# Patient Record
Sex: Male | Born: 1950 | Race: White | Hispanic: No | Marital: Single | Smoking: Never smoker
Health system: Southern US, Community
[De-identification: ages and names within clinical notes are randomized; demographics above are authoritative.]

## PROBLEM LIST (undated history)

## (undated) DIAGNOSIS — C20 Malignant neoplasm of rectum: Secondary | ICD-10-CM

## (undated) DIAGNOSIS — R627 Adult failure to thrive: Secondary | ICD-10-CM

## (undated) DIAGNOSIS — M7989 Other specified soft tissue disorders: Secondary | ICD-10-CM

## (undated) DIAGNOSIS — T07XXXA Unspecified multiple injuries, initial encounter: Secondary | ICD-10-CM

## (undated) DIAGNOSIS — E039 Hypothyroidism, unspecified: Secondary | ICD-10-CM

## (undated) DIAGNOSIS — Z7901 Long term (current) use of anticoagulants: Secondary | ICD-10-CM

## (undated) DIAGNOSIS — N2581 Secondary hyperparathyroidism of renal origin: Secondary | ICD-10-CM

## (undated) DIAGNOSIS — E559 Vitamin D deficiency, unspecified: Secondary | ICD-10-CM

## (undated) DIAGNOSIS — E785 Hyperlipidemia, unspecified: Secondary | ICD-10-CM

## (undated) DIAGNOSIS — M1990 Unspecified osteoarthritis, unspecified site: Secondary | ICD-10-CM

## (undated) DIAGNOSIS — F419 Anxiety disorder, unspecified: Secondary | ICD-10-CM

## (undated) DIAGNOSIS — I10 Essential (primary) hypertension: Secondary | ICD-10-CM

## (undated) DIAGNOSIS — D649 Anemia, unspecified: Secondary | ICD-10-CM

## (undated) DIAGNOSIS — R0602 Shortness of breath: Secondary | ICD-10-CM

## (undated) DIAGNOSIS — C187 Malignant neoplasm of sigmoid colon: Secondary | ICD-10-CM

## (undated) DIAGNOSIS — J811 Chronic pulmonary edema: Secondary | ICD-10-CM

## (undated) DIAGNOSIS — I2129 ST elevation (STEMI) myocardial infarction involving other sites: Secondary | ICD-10-CM

## (undated) DIAGNOSIS — N189 Chronic kidney disease, unspecified: Secondary | ICD-10-CM

## (undated) DIAGNOSIS — C189 Malignant neoplasm of colon, unspecified: Secondary | ICD-10-CM

## (undated) DIAGNOSIS — M549 Dorsalgia, unspecified: Secondary | ICD-10-CM

## (undated) DIAGNOSIS — I2699 Other pulmonary embolism without acute cor pulmonale: Secondary | ICD-10-CM

## (undated) HISTORY — DX: Other specified soft tissue disorders: M79.89

## (undated) HISTORY — DX: Hyperlipidemia, unspecified: E78.5

## (undated) HISTORY — DX: Dorsalgia, unspecified: M54.9

## (undated) HISTORY — DX: Chronic pulmonary edema: J81.1

## (undated) HISTORY — DX: Morbid (severe) obesity due to excess calories: E66.01

## (undated) HISTORY — DX: Hypothyroidism, unspecified: E03.9

## (undated) HISTORY — DX: Vitamin D deficiency, unspecified: E55.9

## (undated) HISTORY — DX: Malignant neoplasm of colon, unspecified: C18.9

---

## 2010-02-16 ENCOUNTER — Inpatient Hospital Stay (HOSPITAL_COMMUNITY): Admission: EM | Admit: 2010-02-16 | Discharge: 2010-02-18 | Payer: Self-pay | Admitting: Emergency Medicine

## 2010-02-16 ENCOUNTER — Ambulatory Visit: Payer: Self-pay | Admitting: Cardiovascular Disease

## 2010-02-17 ENCOUNTER — Encounter (INDEPENDENT_AMBULATORY_CARE_PROVIDER_SITE_OTHER): Payer: Self-pay | Admitting: Internal Medicine

## 2010-02-17 ENCOUNTER — Ambulatory Visit: Payer: Self-pay | Admitting: Vascular Surgery

## 2010-02-21 ENCOUNTER — Ambulatory Visit: Payer: Self-pay | Admitting: Cardiology

## 2010-02-21 DIAGNOSIS — N183 Chronic kidney disease, stage 3 unspecified: Secondary | ICD-10-CM | POA: Insufficient documentation

## 2010-02-21 DIAGNOSIS — E039 Hypothyroidism, unspecified: Secondary | ICD-10-CM

## 2010-02-21 DIAGNOSIS — I10 Essential (primary) hypertension: Secondary | ICD-10-CM

## 2010-02-21 DIAGNOSIS — E785 Hyperlipidemia, unspecified: Secondary | ICD-10-CM

## 2010-02-21 LAB — CONVERTED CEMR LAB

## 2010-02-22 ENCOUNTER — Ambulatory Visit: Payer: Self-pay | Admitting: Internal Medicine

## 2010-02-22 DIAGNOSIS — Z86718 Personal history of other venous thrombosis and embolism: Secondary | ICD-10-CM | POA: Insufficient documentation

## 2010-02-23 ENCOUNTER — Ambulatory Visit: Payer: Self-pay

## 2010-03-02 ENCOUNTER — Encounter: Payer: Self-pay | Admitting: Internal Medicine

## 2010-03-02 LAB — CONVERTED CEMR LAB: POC INR: 2.34

## 2010-03-03 ENCOUNTER — Encounter: Payer: Self-pay | Admitting: Internal Medicine

## 2010-03-09 ENCOUNTER — Encounter: Payer: Self-pay | Admitting: Cardiovascular Disease

## 2010-03-09 LAB — CONVERTED CEMR LAB
POC INR: 2.3
Prothrombin Time: 28.5 s

## 2010-03-16 ENCOUNTER — Ambulatory Visit: Payer: Self-pay | Admitting: Cardiovascular Disease

## 2010-03-16 LAB — CONVERTED CEMR LAB: POC INR: 2.4

## 2010-03-30 ENCOUNTER — Encounter: Payer: Self-pay | Admitting: Internal Medicine

## 2010-03-30 LAB — CONVERTED CEMR LAB: POC INR: 2.6

## 2010-04-25 ENCOUNTER — Ambulatory Visit: Payer: Self-pay | Admitting: Cardiology

## 2010-05-22 ENCOUNTER — Ambulatory Visit: Payer: Self-pay | Admitting: Cardiology

## 2010-06-19 ENCOUNTER — Ambulatory Visit: Payer: Self-pay | Admitting: Cardiology

## 2010-07-17 ENCOUNTER — Ambulatory Visit: Payer: Self-pay | Admitting: Cardiology

## 2010-08-14 ENCOUNTER — Ambulatory Visit: Admission: RE | Admit: 2010-08-14 | Discharge: 2010-08-14 | Payer: Self-pay | Source: Home / Self Care

## 2010-08-14 LAB — CONVERTED CEMR LAB: INR: 1.9

## 2010-09-05 NOTE — Medication Information (Signed)
Summary: rov/cs  Anticoagulant Therapy  Managed by: Weston Brass, PharmD PCP: Dr. Vanessa Landis Supervising MD: Shirlee Latch MD, Freida Busman Indication 1: DVT/PE (first episode) Lab Used: [facility] [place] Site: [address] INR POC 2.3 INR RANGE 2.0-3.0  Dietary changes: no    Health status changes: no    Bleeding/hemorrhagic complications: no    Recent/future hospitalizations: no    Any changes in medication regimen? no    Recent/future dental: no  Any missed doses?: no       Is patient compliant with meds? yes       Allergies: No Known Drug Allergies  Anticoagulation Management History:      The patient is taking warfarin and comes in today for a routine follow up visit.  Negative risk factors for bleeding include an age less than 54 years old.  The bleeding index is 'low risk'.  Positive CHADS2 values include History of HTN.  Negative CHADS2 values include Age > 77 years old.  Anticoagulation responsible provider: Shirlee Latch MD, Janos Shampine.  INR POC: 2.3.  Cuvette Lot#: [id number].  Exp: 07/2011.    Anticoagulation Management Assessment/Plan:      The patient's current anticoagulation dose is Warfarin sodium 5 mg tabs: Use as directed by Anticoagulation Clinic.  The target INR is 2.0-3.0.  The next INR is due 07/17/2010.  Anticoagulation instructions were given to patient.  Results were reviewed/authorized by Weston Brass, PharmD.  He was notified by Weston Brass PharmD.         Prior Anticoagulation Instructions: INR 2.5  Continue Coumadin as scheduled:  1 tablet every day of the week.  Return to clinic in 4 weeks.    Current Anticoagulation Instructions: INR 2.3  Continue same dose of 1 tablet every day.  Recheck INR in 4 weeks.

## 2010-09-05 NOTE — Assessment & Plan Note (Signed)
Summary: Pulmonary consultation/ PE/DVT on R   Visit Type:  Initial Consult Primary Provider/Referring Provider:  Dr. Vanessa [place]  CC:  Dyspnea- Hx of PE.  History of Present Illness: 60 yowm with morbid obesity  never smoked no previous resp problems prior to PE 02/16/2010  February 22, 2010  1st pulmonary office eval cc abrupt onset sob 7/14 > [facility] dx PE  and Left leg DVT discharged 7/16 with f/u at [facility] coumadin and lovenox bridge.  breathing better. no pleuritic pain, no hemoptysis.  H/o remote right ankle fx. Pt denies any significant sore throat, dysphagia, itching, sneezing,  nasal congestion or excess secretions,  fever, chills, sweats, unintended wt loss, pleuritic or exertional cp, hempoptysis, change in activity tolerance  orthopnea pnd or leg swelling.   Current Medications (verified): 1)  Warfarin Sodium 5 Mg Tabs (Warfarin Sodium) .... Use As Directed By Anticoagulation Clinic 2)  Lovenox 150 Mg/ml Soln (Enoxaparin Sodium) .... Subcutaneously Two Times A Day 3)  Levothroid 25 Mcg Tabs (Levothyroxine Sodium) .... Take 1 Tablet 30 Minutes Before Breakfast in Am 4)  Simvastatin 40 Mg Tabs (Simvastatin) .... Daily At Bedtime 5)  Aspirin 81 Mg Tbec (Aspirin) .... Daily At Bedtime 6)  Atenolol 100 Mg Tabs (Atenolol) .... Take One Tablet By Mouth Daily 7)  Doxazosin Mesylate 2 Mg Tabs (Doxazosin Mesylate) .... Take 1 Tablet Daily 8)  Multivitamins  Tabs (Multiple Vitamin) .... 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  The patient denies shortness of  breath at rest, productive cough, non-productive cough, coughing up blood, chest pain, irregular heartbeats, acid heartburn, indigestion, loss of appetite, weight change, abdominal pain, difficulty swallowing, sore throat, tooth/dental problems, headaches, nasal congestion/difficulty breathing through nose, sneezing, itching, ear ache, anxiety, depression, joint stiffness or pain, rash, change in color of mucus, and fever.    Vital Signs:  Patient profile:   60 year old male Height:      71 inches Weight:      346.25 pounds BMI:     48.47 O2 Sat:      97 % on Room air Temp:     97.9 degrees F oral Pulse rate:   68 / minute BP sitting:   124 / 78  (left arm) Cuff size:   large  Vitals Entered By: Vernie Murders (February 22, 2010 10:04 AM)  Nutrition Counseling: Patient's BMI is greater than 25 and therefore counseled on weight management options.  O2 Flow:  Room air  Physical Exam  Additional Exam:  wt 346 February 22, 2010  amb obese wm nad HEENT: nl dentition, turbinates, and orophanx. Nl external ear canals without cough reflex NECK :  without JVD/Nodes/TM/ nl carotid upstrokes bilaterally LUNGS: no acc muscle use, clear to A and P bilaterally without cough on insp or exp maneuvers CV:  RRR  no s3 or murmur or increase in P2, no edema  ABD:  soft and nontender with nl excursion in the supine position. 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 Use as directed by anticoagulation clinic    Lovenox 150 Mg/ml Soln (Enoxaparin sodium) ..... Subcutaneously two times a day    Aspirin 81 Mg Tbec  (Aspirin) .[name]... Daily at bedtime  rx per coumadin clinic, rec minimum of 6 months due to extensive dvt and morbid obesity.  If clears the dvt and begins to loose wt then there might be an option to stop coumadin, otherwise continue indefinitely.  Orders: Consultation Level IV (16109)  Problem # 2:  MORBID OBESITY (ICD-278.01)  Weight control is a matter of calorie balance which needs to be tilted in the pt's favor by eating less and exercising more.  Specifically, I recommended  exercise at a level where pt  is short of breath but not out of breath 30 minutes daily.  If not losing weight on this program, I would strongly recommend pt see a nutritionist with a food diary recorded for two weeks prior to the visit.     Orders: Consultation Level IV [phone])  Patient Instructions: 1)  Coumadin treatment is a minimum of 6 months then you will need the right leg checked for clots and regroup on on issue of longterm coumadin 2)  Weight loss will help you immensely in terms of risk of future clots

## 2010-09-05 NOTE — Medication Information (Signed)
Summary: rov/ewj  Anticoagulant Therapy  Managed by: Weston Brass, PharmD PCP: Dr. Vanessa  Supervising MD: Excell Seltzer MD, Casimiro Needle Indication 1: DVT/PE (first episode) Lab Used: [facility] [place] Site: [facility] INR POC 2.4 INR RANGE 2.0-3.0  Dietary changes: no    Health status changes: no    Bleeding/hemorrhagic complications: no    Recent/future hospitalizations: no    Any changes in medication regimen? no    Recent/future dental: no  Any missed doses?: no       Is patient compliant with meds? yes       Allergies: No Known Drug Allergies  Anticoagulation Management History:      The patient is taking warfarin and comes in today for a routine follow up visit.  Negative risk factors for bleeding include an age less than 25 years old.  The bleeding index is 'low risk'.  Positive CHADS2 values include History of HTN.  Negative CHADS2 values include Age > 20 years old.  Anticoagulation responsible provider: Excell Seltzer MD, Casimiro Needle.  INR POC: 2.4.  Cuvette Lot#: [id number].  Exp: 05/2011.    Anticoagulation Management Assessment/Plan:      The patient's current anticoagulation dose is Warfarin sodium 5 mg tabs: Use as directed by Anticoagulation Clinic.  The target INR is 2.0-3.0.  The next INR is due 03/30/2010.  Anticoagulation instructions were given to patient.  Results were reviewed/authorized by Weston Brass, PharmD.  He was notified by Alease Frame, PharmD Candidate.         Prior Anticoagulation Instructions: INR 2.3  Called spoke with pt advised to continue on same dosage 5mg  daily.  Pt is in town next week.  Made OV in [facility] 03/16/10.   Current Anticoagulation Instructions: INR 2.4  Continue taking 1 tablet (5mg ) every day.  Recheck in 2 weeks.

## 2010-09-05 NOTE — Medication Information (Signed)
Summary: rov/sp  Anticoagulant Therapy  Managed by: Weston Brass, PharmD PCP: Dr. Vanessa [place] Supervising MD: Shirlee Latch MD, Freida Busman Indication 1: DVT/PE (first episode) Lab Used: [facility]  Site: [address] INR POC 2.7 INR RANGE 2.0-3.0  Dietary changes: no    Health status changes: no    Bleeding/hemorrhagic complications: no    Recent/future hospitalizations: no    Any changes in medication regimen? no    Recent/future dental: no  Any missed doses?: no       Is patient compliant with meds? no       Allergies: No Known Drug Allergies  Anticoagulation Management History:      The patient is taking warfarin and comes in today for a routine follow up visit.  Negative risk factors for bleeding include an age less than 45 years old.  The bleeding index is 'low risk'.  Positive CHADS2 values include History of HTN.  Negative CHADS2 values include Age > 59 years old.  Anticoagulation responsible provider: Shirlee Latch MD, Shaun Runyon.  INR POC: 2.7.  Cuvette Lot#: [id number].  Exp: 05/2011.    Anticoagulation Management Assessment/Plan:      The patient's current anticoagulation dose is Warfarin sodium 5 mg tabs: Use as directed by Anticoagulation Clinic.  The target INR is 2.0-3.0.  The next INR is due 05/22/2010.  Anticoagulation instructions were given to patient.  Results were reviewed/authorized by Weston Brass, PharmD.  He was notified by Harrel Carina, PharmD candidate.         Prior Anticoagulation Instructions: INR 2.6  Spoke with pt.  Continue same dose of 1 tablet every day. Recheck INR in 4 weeks.   Current Anticoagulation Instructions: INR 2.7  Continue taking 1 tablet everyday. Re-check INR in 4 weeks.

## 2010-09-05 NOTE — Medication Information (Signed)
Summary: rov/jaj  Anticoagulant Therapy  Managed by: Weston Brass, PharmD PCP: Dr. Vanessa [place] Supervising MD: Patty Sermons Indication 1: DVT/PE (first episode) Lab Used: [facility] [place] Site: [address] INR POC 2.5 INR RANGE 2.0-3.0  Dietary changes: no    Health status changes: no    Bleeding/hemorrhagic complications: no    Recent/future hospitalizations: no    Any changes in medication regimen? no    Recent/future dental: no  Any missed doses?: no       Is patient compliant with meds? yes       Allergies: No Known Drug Allergies  Anticoagulation Management History:      The patient is taking warfarin and comes in today for a routine follow up visit.  Negative risk factors for bleeding include an age less than 37 years old.  The bleeding index is 'low risk'.  Positive CHADS2 values include History of HTN.  Negative CHADS2 values include Age > 68 years old.  Anticoagulation responsible provider: Sheccid Lahmann.  INR POC: 2.5.  Cuvette Lot#: [id number].  Exp: 06/2011.    Anticoagulation Management Assessment/Plan:      The patient's current anticoagulation dose is Warfarin sodium 5 mg tabs: Use as directed by Anticoagulation Clinic.  The target INR is 2.0-3.0.  The next INR is due 06/19/2010.  Anticoagulation instructions were given to patient.  Results were reviewed/authorized by Weston Brass, PharmD.  He was notified by Haynes Hoehn, PharmD Candidate.         Prior Anticoagulation Instructions: INR 2.7  Continue taking 1 tablet everyday. Re-check INR in 4 weeks.   Current Anticoagulation Instructions: INR 2.5  Continue Coumadin as scheduled:  1 tablet every day of the week.  Return to clinic in 4 weeks.

## 2010-09-05 NOTE — Medication Information (Signed)
Summary: rov/tm  Anticoagulant Therapy  Managed by: Weston Brass, PharmD PCP: Dr. Vanessa [place] Supervising MD: Shirlee Latch MD, Freida Busman Indication 1: DVT/PE (first episode) Lab Used: [facility] [place] Site: [address] INR POC 2.1 INR RANGE 2.0-3.0  Dietary changes: no    Health status changes: no    Bleeding/hemorrhagic complications: no    Recent/future hospitalizations: no    Any changes in medication regimen? no    Recent/future dental: no  Any missed doses?: no       Is patient compliant with meds? yes      Comments: Pt to be out of town starting 7/25 for 2 weeks due to work.  Given prescription to get INR checked twice while away.  Allergies: No Known Drug Allergies  Anticoagulation Management History:      The patient is taking warfarin and comes in today for a routine follow up visit.  Negative risk factors for bleeding include an age less than 76 years old.  The bleeding index is 'low risk'.  Positive CHADS2 values include History of HTN.  Negative CHADS2 values include Age > 45 years old.  Anticoagulation responsible provider: Shirlee Latch MD, Raianna Slight.  INR POC: 2.1.  Cuvette Lot#: [id number].  Exp: 05/2011.    Anticoagulation Management Assessment/Plan:      The patient's current anticoagulation dose is Warfarin sodium 5 mg tabs: Use as directed by Anticoagulation Clinic.  The target INR is 2.0-3.0.  The next INR is due 03/02/2010.  Anticoagulation instructions were given to patient.  Results were reviewed/authorized by Weston Brass, PharmD.  He was notified by Dillard Cannon.         Prior Anticoagulation Instructions: INR 1.6 Today take 2 tabs, Wednesday take 1.5 tabs and also continue Lovenox injections. Recheck in 2 days.   Current Anticoagulation Instructions: INR 2.1  D/C Lovenox.  Take 1 tab daily.  Re-check on 7/28 and 8/4.   Prescriptions: WARFARIN SODIUM 5 MG TABS (WARFARIN SODIUM) Use as directed by Anticoagulation Clinic  #45 x 2   Entered by:    Weston Brass PharmD   Authorized by:   Rollene Rotunda, MD, [facility]   Signed by:   Weston Brass PharmD on 02/23/2010   Method used:   Electronically to        [facility] [phone]* (retail)       [address]       [place], [place]  [address]       Ph: [phone]       Fax: [phone]   RxID:   [id number]

## 2010-09-05 NOTE — Medication Information (Signed)
Summary: Coumadin Clinic  Anticoagulant Therapy  Managed by: Weston Brass, PharmD PCP: Dr. Vanessa [place] Supervising MD: Graciela Husbands MD, Viviann Spare Indication 1: DVT/PE (first episode) Lab Used: [facility] [place] Site: [address] PT 27.5 INR POC 2.6 INR RANGE 2.0-3.0  Dietary changes: no    Health status changes: no    Bleeding/hemorrhagic complications: no    Recent/future hospitalizations: no    Any changes in medication regimen? no    Recent/future dental: no  Any missed doses?: no       Is patient compliant with meds? yes       Allergies: No Known Drug Allergies  Anticoagulation Management History:      His anticoagulation is being managed by telephone today.  Negative risk factors for bleeding include an age less than 72 years old.  The bleeding index is 'low risk'.  Positive CHADS2 values include History of HTN.  Negative CHADS2 values include Age > 38 years old.  Prothrombin time is 27.5.  Anticoagulation responsible provider: Graciela Husbands MD, Viviann Spare.  INR POC: 2.6.  Exp: 05/2011.    Anticoagulation Management Assessment/Plan:      The patient's current anticoagulation dose is Warfarin sodium 5 mg tabs: Use as directed by Anticoagulation Clinic.  The target INR is 2.0-3.0.  The next INR is due 04/25/2010.  Anticoagulation instructions were given to patient.  Results were reviewed/authorized by Weston Brass, PharmD.  He was notified by Weston Brass PharmD.         Prior Anticoagulation Instructions: INR 2.4  Continue taking 1 tablet (5mg ) every day.  Recheck in 2 weeks.   Current Anticoagulation Instructions: INR 2.6  Spoke with pt.  Continue same dose of 1 tablet every day. Recheck INR in 4 weeks.

## 2010-09-05 NOTE — Medication Information (Signed)
Summary: Coumadin Clinic  Anticoagulant Therapy  Managed by: Cloyde Reams, RN, BSN PCP: Dr. Vanessa [place] Supervising MD: Clifton Schuyler MD, Cristal Deer Indication 1: DVT/PE (first episode) Lab Used: [facility] [place] Site: [facility] PT 28.5 INR POC 2.3 INR RANGE 2.0-3.0    Bleeding/hemorrhagic complications: no     Any changes in medication regimen? no     Any missed doses?: no       Is patient compliant with meds? yes       Allergies: No Known Drug Allergies  Anticoagulation Management History:      His anticoagulation is being managed by telephone today.  Negative risk factors for bleeding include an age less than 74 years old.  The bleeding index is 'low risk'.  Positive CHADS2 values include History of HTN.  Negative CHADS2 values include Age > 69 years old.  Prothrombin time is 28.5.  Anticoagulation responsible provider: Clifton Reginold MD, Cristal Deer.  INR POC: 2.3.  Exp: 05/2011.    Anticoagulation Management Assessment/Plan:      The patient's current anticoagulation dose is Warfarin sodium 5 mg tabs: Use as directed by Anticoagulation Clinic.  The target INR is 2.0-3.0.  The next INR is due 03/16/2010.  Anticoagulation instructions were given to patient.  Results were reviewed/authorized by Cloyde Reams, RN, BSN.  He was notified by Cloyde Reams RN.         Prior Anticoagulation Instructions: INR 2.34  Spoke with pt.  Continue same dose of 5mg  daily.  Recheck INR in 1 week.  Pt has Rx to check in [place]  Current Anticoagulation Instructions: INR 2.3  Called spoke with pt advised to continue on same dosage 5mg  daily.  Pt is in town next week.  Made OV in [facility] 03/16/10.

## 2010-09-05 NOTE — Medication Information (Signed)
Summary: Coumadin Clinic  Anticoagulant Therapy  Managed by: Cloyde Reams, RN, BSN PCP: Dr. Vanessa [place] Supervising MD: Gala Romney MD, Reuel Boom Indication 1: DVT/PE (first episode) Lab Used: [facility]  Site: [address] PT 22.8 INR POC 2.34 INR RANGE 2.0-3.0  Dietary changes: no    Health status changes: no    Bleeding/hemorrhagic complications: no    Recent/future hospitalizations: no    Any changes in medication regimen? no    Recent/future dental: no  Any missed doses?: no       Is patient compliant with meds? yes      Comments: Lab from [place], [place] received 7/29  Allergies: No Known Drug Allergies  Anticoagulation Management History:      His anticoagulation is being managed by telephone today.  Negative risk factors for bleeding include an age less than 68 years old.  The bleeding index is 'low risk'.  Positive CHADS2 values include History of HTN.  Negative CHADS2 values include Age > 66 years old.  Prothrombin time is 22.8.  Anticoagulation responsible provider: Leodan Bolyard MD, Reuel Boom.  INR POC: 2.34.  Exp: 05/2011.    Anticoagulation Management Assessment/Plan:      The patient's current anticoagulation dose is Warfarin sodium 5 mg tabs: Use as directed by Anticoagulation Clinic.  The target INR is 2.0-3.0.  The next INR is due 03/09/2010.  Anticoagulation instructions were given to patient.  Results were reviewed/authorized by Cloyde Reams, RN, BSN.  He was notified by Cloyde Reams RN.         Prior Anticoagulation Instructions: INR 2.1  D/C Lovenox.  Take 1 tab daily.  Re-check on 7/28 and 8/4.    Current Anticoagulation Instructions: INR 2.34  Spoke with pt.  Continue same dose of 5mg  daily.  Recheck INR in 1 week.  Pt has Rx to check in [place]

## 2010-09-05 NOTE — Medication Information (Signed)
Summary: ccn  Anticoagulant Therapy  Managed by: Bethena Midget, RN, BSN Supervising MD: Jens Som MD, Arlys John Indication 1: DVT/PE (first episode) Lab Used: [facility] [place] Site: [address] INR POC 1.6 INR RANGE 2.0-3.0  Dietary changes: no    Health status changes: no    Bleeding/hemorrhagic complications: no    Recent/future hospitalizations: yes       Details: Admitted to [facility] from 7/14 to 7/16 for PE  Any changes in medication regimen? no    Recent/future dental: no  Any missed doses?: no       Is patient compliant with meds? yes      Comments: Started 7/16 with 10mg s and Lovenox 150mg s Q12hrs. INR on 7/16 was  1.16. Discharged home on 5mg s daily.   Current Medications (verified): 1)  Warfarin Sodium 5 Mg Tabs (Warfarin Sodium) .... Use As Directed By Anticoagulation Clinic 2)  Lovenox 150 Mg/ml Soln (Enoxaparin Sodium) .... Subcutaneously Two Times A Day 3)  Levothroid 25 Mcg Tabs (Levothyroxine Sodium) .... Take 1 Tablet 30 Minutes Before Breakfast in Am 4)  Simvastatin 40 Mg Tabs (Simvastatin) .... Daily At Bedtime 5)  Aspirin 81 Mg Tbec (Aspirin) .... Daily At Bedtime 6)  Atenolol 100 Mg Tabs (Atenolol) .... Take One Tablet By Mouth Daily 7)  Doxazosin Mesylate 2 Mg Tabs (Doxazosin Mesylate) .... Take 1 Tablet Daily 8)  Multivitamins  Tabs (Multiple Vitamin) .... Take 1 Tablet Daily  Allergies (verified): No Known Drug Allergies  Anticoagulation Management History:      The patient is taking warfarin and comes in today for a routine follow up visit.  Negative risk factors for bleeding include an age less than 50 years old.  The bleeding index is 'low risk'.  Negative CHADS2 values include Age > 103 years old.  Anticoagulation responsible provider: Jens Som MD, Arlys John.  INR POC: 1.6.  Cuvette Lot#: [id number].  Exp: 05/2011.    Anticoagulation Management Assessment/Plan:      The patient's current anticoagulation dose is Warfarin sodium 5 mg tabs: Use  as directed by Anticoagulation Clinic.  The target INR is 2.0-3.0.  The next INR is due 02/23/2010.  Anticoagulation instructions were given to patient.  Results were reviewed/authorized by Bethena Midget, RN, BSN.  He was notified by Bethena Midget, RN, BSN.         Current Anticoagulation Instructions: INR 1.6 Today take 2 tabs, Wednesday take 1.5 tabs and also continue Lovenox injections. Recheck in 2 days.

## 2010-09-07 NOTE — Medication Information (Signed)
Summary: rov/sp  Anticoagulant Therapy  Managed by: Weston Brass, PharmD PCP: Dr. Vanessa [place] Supervising MD: Jens Som MD, Arlys John Indication 1: DVT/PE (first episode) Lab Used: [facility] [place] Site: [address] INR POC 2.1 INR RANGE 2.0-3.0  Dietary changes: no    Health status changes: no    Bleeding/hemorrhagic complications: no    Recent/future hospitalizations: no    Any changes in medication regimen? no    Recent/future dental: no  Any missed doses?: no       Is patient compliant with meds? yes       Allergies: No Known Drug Allergies  Anticoagulation Management History:      The patient is taking warfarin and comes in today for a routine follow up visit.  Negative risk factors for bleeding include an age less than 40 years old.  The bleeding index is 'low risk'.  Positive CHADS2 values include History of HTN.  Negative CHADS2 values include Age > 14 years old.  Anticoagulation responsible provider: Jens Som MD, Arlys John.  INR POC: 2.1.  Cuvette Lot#: [id number].  Exp: 08/2011.    Anticoagulation Management Assessment/Plan:      The patient's current anticoagulation dose is Warfarin sodium 5 mg tabs: Use as directed by Anticoagulation Clinic.  The target INR is 2.0-3.0.  The next INR is due 08/14/2010.  Anticoagulation instructions were given to patient.  Results were reviewed/authorized by Weston Brass, PharmD.  He was notified by Weston Brass PharmD.         Prior Anticoagulation Instructions: INR 2.3  Continue same dose of 1 tablet every day.  Recheck INR in 4 weeks.   Current Anticoagulation Instructions: INR 2.1  Continue same dose of 1 tablet every day.  Recheck INR 4 weeks.

## 2010-09-07 NOTE — Medication Information (Signed)
Summary: rov/sp  Anticoagulant Therapy  Managed by: Louann Sjogren, PharmD PCP: Dr. Vanessa Amherst Supervising MD: Jens Som MD, Arlys John Indication 1: DVT/PE (first episode) Lab Used: [facility] [place] Site: [address] INR POC 1.9 INR RANGE 2.0-3.0  Dietary changes: no    Health status changes: no    Bleeding/hemorrhagic complications: no    Recent/future hospitalizations: no    Any changes in medication regimen? no    Recent/future dental: no  Any missed doses?: no       Is patient compliant with meds? yes       Allergies: No Known Drug Allergies  Anticoagulation Management History:      The patient is taking warfarin and comes in today for a routine follow up visit.  Negative risk factors for bleeding include an age less than 71 years old.  The bleeding index is 'low risk'.  Positive CHADS2 values include History of HTN.  Negative CHADS2 values include Age > 76 years old.  Today's INR is 1.9.  Anticoagulation responsible provider: Jens Som MD, Arlys John.  INR POC: 1.9.  Cuvette Lot#: [id number].  Exp: 08/2011.    Anticoagulation Management Assessment/Plan:      The patient's current anticoagulation dose is Warfarin sodium 5 mg tabs: Use as directed by Anticoagulation Clinic.  The target INR is 2.0-3.0.  The next INR is due 09/11/2010.  Anticoagulation instructions were given to patient.  Results were reviewed/authorized by Louann Sjogren, PharmD.  He was notified by Louann Sjogren PharmD.         Prior Anticoagulation Instructions: INR 2.1  Continue same dose of 1 tablet every day.  Recheck INR 4 weeks.   Current Anticoagulation Instructions: INR 1.9 (goal 2-3)  Continue taking 1 tablet everyday.  Next appointment: Monday, Feb. 6th at 8:00AM.

## 2010-09-11 ENCOUNTER — Encounter: Payer: Self-pay | Admitting: Cardiology

## 2010-09-11 ENCOUNTER — Encounter (INDEPENDENT_AMBULATORY_CARE_PROVIDER_SITE_OTHER): Payer: Self-pay

## 2010-09-11 DIAGNOSIS — I2699 Other pulmonary embolism without acute cor pulmonale: Secondary | ICD-10-CM

## 2010-09-11 DIAGNOSIS — Z7901 Long term (current) use of anticoagulants: Secondary | ICD-10-CM

## 2010-09-21 NOTE — Medication Information (Signed)
Summary: Coumadin Clinic  Anticoagulant Therapy  Managed by: Weston Brass, PharmD PCP: Dr. Vanessa McDougal Supervising MD: Myrtis Ser MD, Tinnie Gens Indication 1: DVT/PE (first episode) Lab Used: [facility] [place] Site: [address] INR POC 2.8 INR RANGE 2.0-3.0  Dietary changes: no    Health status changes: no    Bleeding/hemorrhagic complications: no    Recent/future hospitalizations: no    Any changes in medication regimen? yes       Details: Doxycycline BID x 10 days - started Saturday  Recent/future dental: no  Any missed doses?: no       Is patient compliant with meds? yes       Allergies: No Known Drug Allergies  Anticoagulation Management History:      The patient is taking warfarin and comes in today for a routine follow up visit.  Negative risk factors for bleeding include an age less than 29 years old.  The bleeding index is 'low risk'.  Positive CHADS2 values include History of HTN.  Negative CHADS2 values include Age > 3 years old.  His last INR was 1.9.  Anticoagulation responsible provider: Myrtis Ser MD, Tinnie Gens.  INR POC: 2.8.  Cuvette Lot#: [id number].  Exp: 08/2011.    Anticoagulation Management Assessment/Plan:      The patient's current anticoagulation dose is Warfarin sodium 5 mg tabs: Use as directed by Anticoagulation Clinic.  The target INR is 2.0-3.0.  The next INR is due 10/10/2010.  Anticoagulation instructions were given to patient.  Results were reviewed/authorized by Weston Brass, PharmD.  He was notified by Margot Chimes PharmD Candidate.         Prior Anticoagulation Instructions: INR 1.9 (goal 2-3)  Continue taking 1 tablet everyday.  Next appointment: Monday, Feb. 6th at 8:00AM.    Current Anticoagulation Instructions: INR 2.8  Continue your current Coumadin regimen of 1 tablet everyday

## 2010-10-09 ENCOUNTER — Encounter: Payer: Self-pay | Admitting: Internal Medicine

## 2010-10-09 DIAGNOSIS — I2699 Other pulmonary embolism without acute cor pulmonale: Secondary | ICD-10-CM

## 2010-10-09 DIAGNOSIS — I82409 Acute embolism and thrombosis of unspecified deep veins of unspecified lower extremity: Secondary | ICD-10-CM | POA: Insufficient documentation

## 2010-10-09 DIAGNOSIS — Z8672 Personal history of thrombophlebitis: Secondary | ICD-10-CM

## 2010-10-10 ENCOUNTER — Encounter (INDEPENDENT_AMBULATORY_CARE_PROVIDER_SITE_OTHER): Payer: Self-pay

## 2010-10-10 ENCOUNTER — Encounter: Payer: Self-pay | Admitting: Cardiology

## 2010-10-10 DIAGNOSIS — I2699 Other pulmonary embolism without acute cor pulmonale: Secondary | ICD-10-CM

## 2010-10-10 DIAGNOSIS — I80299 Phlebitis and thrombophlebitis of other deep vessels of unspecified lower extremity: Secondary | ICD-10-CM

## 2010-10-10 LAB — CONVERTED CEMR LAB: POC INR: 2.1

## 2010-10-17 NOTE — Medication Information (Signed)
Summary: rov/sp  Anticoagulant Therapy  Managed by: Weston Brass, PharmD PCP: Dr. Vanessa Jewett Supervising MD: Patty Sermons Indication 1: DVT/PE (first episode) Lab Used: [facility] [place] Site: [address] INR POC 2.1 INR RANGE 2.0-3.0  Dietary changes: no    Health status changes: no    Bleeding/hemorrhagic complications: yes       Details: small amount of blood in stool 3 weeks ago after coughing  Recent/future hospitalizations: no    Any changes in medication regimen? no    Recent/future dental: no  Any missed doses?: no       Is patient compliant with meds? yes       Allergies: No Known Drug Allergies  Anticoagulation Management History:      The patient is taking warfarin and comes in today for a routine follow up visit.  Negative risk factors for bleeding include an age less than 79 years old.  The bleeding index is 'low risk'.  Positive CHADS2 values include History of HTN.  Negative CHADS2 values include Age > 74 years old.  His last INR was 1.9.  Anticoagulation responsible provider: Idonna Heeren.  INR POC: 2.1.  Exp: 08/2011.    Anticoagulation Management Assessment/Plan:      The patient's current anticoagulation dose is Warfarin sodium 5 mg tabs: Use as directed by Anticoagulation Clinic.  The target INR is 2.0-3.0.  The next INR is due 11/07/2010.  Anticoagulation instructions were given to patient.  Results were reviewed/authorized by Weston Brass, PharmD.  He was notified by Weston Brass PharmD.         Prior Anticoagulation Instructions: INR 2.8  Continue your current Coumadin regimen of 1 tablet everyday  Current Anticoagulation Instructions: INR 2.1  Continue same dose of 1 tablet every day.  Recheck INR in 4 weeks.

## 2010-10-21 LAB — CBC
Hemoglobin: 14.6 g/dL (ref 13.0–17.0)
MCH: 32.2 pg (ref 26.0–34.0)
MCV: 92.3 fL (ref 78.0–100.0)
Platelets: 132 10*3/uL — ABNORMAL LOW (ref 150–400)
WBC: 6 10*3/uL (ref 4.0–10.5)

## 2010-10-21 LAB — PROTIME-INR
INR: 1.16 (ref 0.00–1.49)
Prothrombin Time: 14.7 seconds (ref 11.6–15.2)

## 2010-10-21 LAB — LIPID PANEL
Cholesterol: 197 mg/dL (ref 0–200)
Total CHOL/HDL Ratio: 7.6 RATIO
Triglycerides: 251 mg/dL — ABNORMAL HIGH (ref <150)

## 2010-10-22 LAB — CBC
HCT: 47.7 % (ref 39.0–52.0)
MCHC: 34.9 g/dL (ref 30.0–36.0)
MCV: 92.8 fL (ref 78.0–100.0)
Platelets: 143 10*3/uL — ABNORMAL LOW (ref 150–400)
RDW: 12.4 % (ref 11.5–15.5)
WBC: 7.9 10*3/uL (ref 4.0–10.5)

## 2010-10-22 LAB — PROTIME-INR: Prothrombin Time: 13.3 seconds (ref 11.6–15.2)

## 2010-10-22 LAB — DIFFERENTIAL
Basophils Absolute: 0 10*3/uL (ref 0.0–0.1)
Basophils Relative: 0 % (ref 0–1)
Lymphocytes Relative: 11 % — ABNORMAL LOW (ref 12–46)
Lymphs Abs: 0.9 10*3/uL (ref 0.7–4.0)
Monocytes Absolute: 0.5 10*3/uL (ref 0.1–1.0)
Neutro Abs: 6.4 10*3/uL (ref 1.7–7.7)
Neutrophils Relative %: 81 % — ABNORMAL HIGH (ref 43–77)

## 2010-10-22 LAB — TSH: TSH: 4.755 u[IU]/mL — ABNORMAL HIGH (ref 0.350–4.500)

## 2010-10-22 LAB — BASIC METABOLIC PANEL
Creatinine, Ser: 1.48 mg/dL (ref 0.4–1.5)
GFR calc Af Amer: 59 mL/min — ABNORMAL LOW (ref >60)
GFR calc non Af Amer: 49 mL/min — ABNORMAL LOW (ref >60)
Glucose, Bld: 116 mg/dL — ABNORMAL HIGH (ref 70–99)

## 2010-10-22 LAB — CARDIOLIPIN ANTIBODIES, IGG, IGM, IGA
Anticardiolipin IgA: 3 APL U/mL — ABNORMAL LOW (ref <22)
Anticardiolipin IgG: 37 GPL U/mL — ABNORMAL HIGH (ref <23)

## 2010-10-22 LAB — LUPUS ANTICOAGULANT PANEL
DRVVT: 47.5 secs — ABNORMAL HIGH (ref 36.2–44.3)
PTT Lupus Anticoagulant: 38.7 secs (ref 30.0–45.6)

## 2010-10-22 LAB — POCT CARDIAC MARKERS
Myoglobin, poc: 132 ng/mL (ref 12–200)
Troponin i, poc: <0.05 ng/mL (ref 0.00–0.09)

## 2010-10-22 LAB — PROTEIN C ACTIVITY: Protein C Activity: 165 % — ABNORMAL HIGH (ref 75–133)

## 2010-10-22 LAB — HEMOGLOBIN A1C: Mean Plasma Glucose: 120 mg/dL — ABNORMAL HIGH (ref <117)

## 2010-10-22 LAB — HOMOCYSTEINE: Homocysteine: 15.7 umol/L — ABNORMAL HIGH (ref 4.0–15.4)

## 2010-10-22 LAB — PROTHROMBIN GENE MUTATION

## 2010-10-22 LAB — APTT: aPTT: 27 seconds (ref 24–37)

## 2010-11-07 ENCOUNTER — Ambulatory Visit (INDEPENDENT_AMBULATORY_CARE_PROVIDER_SITE_OTHER): Payer: Self-pay | Admitting: *Deleted

## 2010-11-07 DIAGNOSIS — I82409 Acute embolism and thrombosis of unspecified deep veins of unspecified lower extremity: Secondary | ICD-10-CM

## 2010-11-07 DIAGNOSIS — Z8672 Personal history of thrombophlebitis: Secondary | ICD-10-CM

## 2010-11-07 DIAGNOSIS — Z7901 Long term (current) use of anticoagulants: Secondary | ICD-10-CM

## 2010-11-07 DIAGNOSIS — I2699 Other pulmonary embolism without acute cor pulmonale: Secondary | ICD-10-CM

## 2010-11-07 MED ORDER — WARFARIN SODIUM 5 MG PO TABS: ORAL_TABLET | ORAL | Status: DC

## 2010-11-07 NOTE — Patient Instructions (Signed)
INR 2.6 Continue taking 1 tablet everyday. Return in 4 weeks.

## 2010-12-04 ENCOUNTER — Encounter: Payer: Self-pay | Admitting: *Deleted

## 2010-12-05 ENCOUNTER — Ambulatory Visit (INDEPENDENT_AMBULATORY_CARE_PROVIDER_SITE_OTHER): Payer: Self-pay | Admitting: *Deleted

## 2010-12-05 DIAGNOSIS — I2699 Other pulmonary embolism without acute cor pulmonale: Secondary | ICD-10-CM

## 2010-12-05 DIAGNOSIS — I82409 Acute embolism and thrombosis of unspecified deep veins of unspecified lower extremity: Secondary | ICD-10-CM

## 2010-12-05 DIAGNOSIS — Z8672 Personal history of thrombophlebitis: Secondary | ICD-10-CM

## 2011-01-02 ENCOUNTER — Ambulatory Visit (INDEPENDENT_AMBULATORY_CARE_PROVIDER_SITE_OTHER): Payer: Self-pay | Admitting: *Deleted

## 2011-01-02 DIAGNOSIS — I82409 Acute embolism and thrombosis of unspecified deep veins of unspecified lower extremity: Secondary | ICD-10-CM

## 2011-01-02 DIAGNOSIS — I2699 Other pulmonary embolism without acute cor pulmonale: Secondary | ICD-10-CM

## 2011-01-02 DIAGNOSIS — Z8672 Personal history of thrombophlebitis: Secondary | ICD-10-CM

## 2011-01-02 LAB — POCT INR: INR: 2.4

## 2011-01-29 ENCOUNTER — Ambulatory Visit (INDEPENDENT_AMBULATORY_CARE_PROVIDER_SITE_OTHER): Payer: Self-pay | Admitting: *Deleted

## 2011-01-29 DIAGNOSIS — I2699 Other pulmonary embolism without acute cor pulmonale: Secondary | ICD-10-CM

## 2011-01-29 DIAGNOSIS — Z8672 Personal history of thrombophlebitis: Secondary | ICD-10-CM

## 2011-01-29 DIAGNOSIS — I82409 Acute embolism and thrombosis of unspecified deep veins of unspecified lower extremity: Secondary | ICD-10-CM

## 2011-01-29 LAB — POCT INR: INR: 2.7

## 2011-02-26 ENCOUNTER — Ambulatory Visit (INDEPENDENT_AMBULATORY_CARE_PROVIDER_SITE_OTHER): Payer: Self-pay | Admitting: *Deleted

## 2011-02-26 DIAGNOSIS — I2699 Other pulmonary embolism without acute cor pulmonale: Secondary | ICD-10-CM

## 2011-02-26 DIAGNOSIS — I82409 Acute embolism and thrombosis of unspecified deep veins of unspecified lower extremity: Secondary | ICD-10-CM

## 2011-02-26 DIAGNOSIS — Z8672 Personal history of thrombophlebitis: Secondary | ICD-10-CM

## 2011-04-02 ENCOUNTER — Encounter: Payer: Self-pay | Admitting: *Deleted

## 2011-04-03 ENCOUNTER — Ambulatory Visit (INDEPENDENT_AMBULATORY_CARE_PROVIDER_SITE_OTHER): Payer: Self-pay | Admitting: *Deleted

## 2011-04-03 DIAGNOSIS — I2699 Other pulmonary embolism without acute cor pulmonale: Secondary | ICD-10-CM

## 2011-04-03 DIAGNOSIS — I82409 Acute embolism and thrombosis of unspecified deep veins of unspecified lower extremity: Secondary | ICD-10-CM

## 2011-04-03 DIAGNOSIS — Z8672 Personal history of thrombophlebitis: Secondary | ICD-10-CM

## 2011-04-03 LAB — POCT INR: INR: 3.1

## 2011-05-01 ENCOUNTER — Ambulatory Visit (INDEPENDENT_AMBULATORY_CARE_PROVIDER_SITE_OTHER): Payer: Self-pay | Admitting: *Deleted

## 2011-05-01 DIAGNOSIS — I2699 Other pulmonary embolism without acute cor pulmonale: Secondary | ICD-10-CM

## 2011-05-01 DIAGNOSIS — Z8672 Personal history of thrombophlebitis: Secondary | ICD-10-CM

## 2011-05-01 DIAGNOSIS — I82409 Acute embolism and thrombosis of unspecified deep veins of unspecified lower extremity: Secondary | ICD-10-CM

## 2011-05-01 LAB — POCT INR: INR: 2.1

## 2011-05-01 MED ORDER — WARFARIN SODIUM 5 MG PO TABS: ORAL_TABLET | ORAL | Status: DC

## 2011-05-29 ENCOUNTER — Ambulatory Visit (INDEPENDENT_AMBULATORY_CARE_PROVIDER_SITE_OTHER): Payer: Self-pay | Admitting: *Deleted

## 2011-05-29 DIAGNOSIS — Z8672 Personal history of thrombophlebitis: Secondary | ICD-10-CM

## 2011-05-29 DIAGNOSIS — I82409 Acute embolism and thrombosis of unspecified deep veins of unspecified lower extremity: Secondary | ICD-10-CM

## 2011-05-29 DIAGNOSIS — I2699 Other pulmonary embolism without acute cor pulmonale: Secondary | ICD-10-CM

## 2011-06-26 ENCOUNTER — Ambulatory Visit (INDEPENDENT_AMBULATORY_CARE_PROVIDER_SITE_OTHER): Payer: Self-pay | Admitting: *Deleted

## 2011-06-26 DIAGNOSIS — Z8672 Personal history of thrombophlebitis: Secondary | ICD-10-CM

## 2011-06-26 DIAGNOSIS — I2699 Other pulmonary embolism without acute cor pulmonale: Secondary | ICD-10-CM

## 2011-06-26 DIAGNOSIS — I82409 Acute embolism and thrombosis of unspecified deep veins of unspecified lower extremity: Secondary | ICD-10-CM

## 2011-08-08 ENCOUNTER — Ambulatory Visit (INDEPENDENT_AMBULATORY_CARE_PROVIDER_SITE_OTHER): Payer: Self-pay | Admitting: *Deleted

## 2011-08-08 DIAGNOSIS — I2699 Other pulmonary embolism without acute cor pulmonale: Secondary | ICD-10-CM

## 2011-08-08 DIAGNOSIS — Z8672 Personal history of thrombophlebitis: Secondary | ICD-10-CM

## 2011-08-08 DIAGNOSIS — I82409 Acute embolism and thrombosis of unspecified deep veins of unspecified lower extremity: Secondary | ICD-10-CM

## 2011-08-28 ENCOUNTER — Emergency Department (HOSPITAL_COMMUNITY): Payer: Self-pay

## 2011-08-28 ENCOUNTER — Emergency Department (HOSPITAL_COMMUNITY)
Admission: EM | Admit: 2011-08-28 | Discharge: 2011-08-28 | Disposition: A | Discharge status: Home / Self Care | Payer: Self-pay | Attending: Emergency Medicine | Admitting: Emergency Medicine

## 2011-08-28 ENCOUNTER — Telehealth: Payer: Self-pay | Admitting: Internal Medicine

## 2011-08-28 ENCOUNTER — Other Ambulatory Visit: Payer: Self-pay

## 2011-08-28 ENCOUNTER — Encounter (HOSPITAL_COMMUNITY): Payer: Self-pay | Admitting: Emergency Medicine

## 2011-08-28 DIAGNOSIS — K625 Hemorrhage of anus and rectum: Secondary | ICD-10-CM | POA: Insufficient documentation

## 2011-08-28 DIAGNOSIS — Z7901 Long term (current) use of anticoagulants: Secondary | ICD-10-CM | POA: Insufficient documentation

## 2011-08-28 DIAGNOSIS — Z86711 Personal history of pulmonary embolism: Secondary | ICD-10-CM | POA: Insufficient documentation

## 2011-08-28 DIAGNOSIS — I10 Essential (primary) hypertension: Secondary | ICD-10-CM | POA: Insufficient documentation

## 2011-08-28 DIAGNOSIS — Z86718 Personal history of other venous thrombosis and embolism: Secondary | ICD-10-CM | POA: Insufficient documentation

## 2011-08-28 DIAGNOSIS — Z79899 Other long term (current) drug therapy: Secondary | ICD-10-CM | POA: Insufficient documentation

## 2011-08-28 DIAGNOSIS — Z7982 Long term (current) use of aspirin: Secondary | ICD-10-CM | POA: Insufficient documentation

## 2011-08-28 HISTORY — DX: Essential (primary) hypertension: I10

## 2011-08-28 HISTORY — DX: Other pulmonary embolism without acute cor pulmonale: I26.99

## 2011-08-28 LAB — CBC
Hemoglobin: 10.5 g/dL — ABNORMAL LOW (ref 13.0–17.0)
RBC: 4.26 MIL/uL (ref 4.22–5.81)

## 2011-08-28 LAB — COMPREHENSIVE METABOLIC PANEL
ALT: 21 U/L (ref 0–53)
Alkaline Phosphatase: 68 U/L (ref 39–117)
CO2: 26 mEq/L (ref 19–32)
GFR calc Af Amer: 56 mL/min — ABNORMAL LOW (ref >90)
GFR calc non Af Amer: 49 mL/min — ABNORMAL LOW (ref >90)
Glucose, Bld: 102 mg/dL — ABNORMAL HIGH (ref 70–99)
Potassium: 4.2 mEq/L (ref 3.5–5.1)
Sodium: 141 mEq/L (ref 135–145)

## 2011-08-28 LAB — POCT I-STAT TROPONIN I: Troponin i, poc: 0.01 ng/mL (ref 0.00–0.08)

## 2011-08-28 MED ORDER — IOHEXOL 300 MG/ML  SOLN
100.0000 mL | Freq: Once | INTRAMUSCULAR | Status: AC | PRN
  Administered 2011-08-28: 100 mL via INTRAVENOUS

## 2011-08-28 NOTE — ED Provider Notes (Signed)
History     CSN: [id number]  Arrival date & time 08/28/11  1201   First MD Initiated Contact with Patient 08/28/11 1542      No chief complaint on file.   (Consider location/radiation/quality/duration/timing/severity/associated sxs/prior treatment) Patient is a 61 y.o. male presenting with abdominal pain. The history is provided by the patient (Patient complains of some). No language interpreter was used.  Abdominal Pain The primary symptoms of the illness do not include fever, fatigue, diarrhea or hematemesis. The current episode started more than 2 days ago. The onset of the illness was sudden. The problem has not changed since onset. Associated with: nothing. The patient states that she believes she is currently not pregnant. The patient has not had a change in bowel habit. Symptoms associated with the illness do not include urgency, hematuria, frequency or back pain. Significant associated medical issues do not include sickle cell disease.    Past Medical History  Diagnosis Date  . Hypertension   . Pulmonary embolism   . DVT (deep vein thrombosis) in pregnancy     History reviewed. No pertinent past surgical history.  No family history on file.  History  Substance Use Topics  . Smoking status: Never Smoker   . Smokeless tobacco: Not on file  . Alcohol Use: No      Review of Systems  Constitutional: Negative for fever and fatigue.  HENT: Negative for congestion, sinus pressure and ear discharge.   Eyes: Negative for discharge.  Respiratory: Negative for cough.   Cardiovascular: Negative for chest pain.  Gastrointestinal: Positive for anal bleeding. Negative for diarrhea and hematemesis.  Genitourinary: Negative for urgency, frequency and hematuria.  Musculoskeletal: Negative for back pain.  Skin: Negative for rash.  Neurological: Negative for seizures and headaches.  Hematological: Negative.   Psychiatric/Behavioral: Negative for hallucinations.    Allergies    Review of patient's allergies indicates no known allergies.  Home Medications   Current Outpatient Rx  Name Route Sig Dispense Refill  . ASPIRIN EC 81 MG PO TBEC Oral Take 81 mg by mouth daily.    . ATENOLOL 100 MG PO TABS Oral Take 100 mg by mouth daily.    [name] DOXAZOSIN MESYLATE 2 MG PO TABS Oral Take 2 mg by mouth at bedtime.    [name] LEVOTHYROXINE SODIUM 25 MCG PO TABS Oral Take 25 mcg by mouth daily.    . ADULT MULTIVITAMIN W/MINERALS CH Oral Take 1 tablet by mouth daily.    [name] SIMVASTATIN 40 MG PO TABS Oral Take 40 mg by mouth every evening.    . WARFARIN SODIUM 5 MG PO TABS Oral Take 5 mg by mouth daily. Take as directed by anticoagulation clinic      BP 153/87  Pulse 87  Temp(Src) 97.8 F (36.6 C) (Oral)  Resp 18  SpO2 92%  Physical Exam  Constitutional: He is oriented to person, place, and time. He appears well-developed.  HENT:  Head: Normocephalic and atraumatic.  Eyes: Conjunctivae and EOM are normal. No scleral icterus.  Neck: Neck supple. No thyromegaly present.  Cardiovascular: Normal rate and regular rhythm.  Exam reveals no gallop and no friction rub.   No murmur heard. Pulmonary/Chest: No stridor. He has no wheezes. He has no rales. He exhibits no tenderness.  Abdominal: He exhibits no distension. There is no tenderness. There is no rebound.  Genitourinary:       Rectal exam,  Small amount of red blood  Musculoskeletal: Normal range of motion. He exhibits no  edema.  Lymphadenopathy:    He has no cervical adenopathy.  Neurological: He is oriented to person, place, and time. Coordination normal.  Skin: No rash noted. No erythema.  Psychiatric: He has a normal mood and affect. His behavior is normal.    ED Course  Procedures (including critical care time)  Labs Reviewed  CBC - Abnormal; Notable for the following:    Hemoglobin 10.5 (*)    HCT 33.2 (*)    MCV 77.9 (*)    MCH 24.6 (*)    All other components within normal limits  COMPREHENSIVE METABOLIC  PANEL - Abnormal; Notable for the following:    Glucose, Bld 102 (*)    Creatinine, Ser 1.50 (*)    Albumin 3.2 (*)    GFR calc non Af Amer 49 (*)    GFR calc Af Amer 56 (*)    All other components within normal limits  PROTIME-INR - Abnormal; Notable for the following:    Prothrombin Time 28.7 (*)    INR 2.65 (*)    All other components within normal limits  PRO B NATRIURETIC PEPTIDE - Abnormal; Notable for the following:    Pro B Natriuretic peptide (BNP) 210.2 (*)    All other components within normal limits  POCT I-STAT TROPONIN I  I-STAT TROPONIN I   Dg Chest 2 View  08/28/2011  *RADIOLOGY REPORT*  Clinical Data: Shortness of breath, swelling of both legs weakness  CHEST - 2 VIEW  Comparison: Chest x-ray of 02/16/2010  Findings: The lungs are clear.  The heart is mildly enlarged. There are degenerative changes throughout the thoracic spine.  IMPRESSION: Stable cardiomegaly.  No active lung disease.  Original Report Authenticated By: Juline Patch, M.D.   Ct Abdomen Pelvis W Contrast  08/28/2011  *RADIOLOGY REPORT*  Clinical Data: Bloody stool  CT ABDOMEN AND PELVIS WITH CONTRAST  Technique:  Multidetector CT imaging of the abdomen and pelvis was performed following the standard protocol during bolus administration of intravenous contrast.  Contrast: OMNIPAQUE IOHEXOL 300 MG/ML IV SOLN  Comparison: None  Findings: No pericardial or pleural effusion identified.  Lung bases appear clear.  Both adrenal glands are normal.  There is a stone identified within the neck of the gallbladder which measures 9 mm. No gallbladder wall thickening or pericholecystic fluid.  No focal liver abnormalities.  No biliary dilatation.  The pancreas appears normal.  Negative appearance of the spleen.  Right kidney is negative.  The left kidney is negative.  No enlarged upper abdominal lymph nodes.  There is no pelvic or inguinal adenopathy noted.  Urinary bladder appears normal.  The stomach is within normal  limits.  The small bowel loops are within normal limits.  The appendix is identified and appears normal.  Colon is unremarkable.  Fat containing left inguinal hernia is identified.  No free fluid or abnormal fluid collections within the abdomen or pelvis.  Review of the visualized osseous structures is significant for mild spondylosis.  IMPRESSION:  1.  No acute findings identified within the abdomen or pelvis. 2.  Gallstone.  Original Report Authenticated By: Rosealee Albee, M.D.     1. Rectal bleeding     I spoke to pulmonary about the patient and it was decided to have his Coumadin stopped right now. He is to follow up with pulmonary in a week and also followup with GI  MDM          Benny Lennert, MD 08/28/11 [phone]

## 2011-08-28 NOTE — ED Notes (Signed)
Blood in stool  X 2 weeks   Feels like he is swelling all over has hx of this he states  Some sob feels weak

## 2011-08-28 NOTE — ED Notes (Signed)
Patient transported to X-ray 

## 2011-08-28 NOTE — Telephone Encounter (Signed)
Got call from Dr Estell Harpin in [facility] ER . I am on call for group  PAtient went to ER today with BRPR. Hgb 10s, 4gm% drop in 18 months when PE and DVT was diagnosed. Dr Estell Harpin asking forr direction with coumading mgmt esp because of 18 months of DVT/PE Rx and there was some consideration to complete anticoagulation Rx  Rec Stop coumadin rx du eto bleed Continue aspirin based on WARFASA study benefit in PE Fu with Pulmonary  Dr Estell Harpin will arrange GI bleed fu

## 2011-09-12 ENCOUNTER — Encounter: Payer: Self-pay | Admitting: Gastroenterology

## 2011-09-12 ENCOUNTER — Other Ambulatory Visit (INDEPENDENT_AMBULATORY_CARE_PROVIDER_SITE_OTHER): Payer: Self-pay

## 2011-09-12 ENCOUNTER — Ambulatory Visit (INDEPENDENT_AMBULATORY_CARE_PROVIDER_SITE_OTHER): Payer: Self-pay | Admitting: Gastroenterology

## 2011-09-12 DIAGNOSIS — K625 Hemorrhage of anus and rectum: Secondary | ICD-10-CM

## 2011-09-12 DIAGNOSIS — D509 Iron deficiency anemia, unspecified: Secondary | ICD-10-CM

## 2011-09-12 LAB — CBC WITH DIFFERENTIAL/PLATELET
Basophils Relative: 0.2 % (ref 0.0–3.0)
Eosinophils Absolute: 0.1 10*3/uL (ref 0.0–0.7)
Eosinophils Relative: 2.2 % (ref 0.0–5.0)
Hemoglobin: 10.5 g/dL — ABNORMAL LOW (ref 13.0–17.0)
Lymphocytes Relative: 20 % (ref 12.0–46.0)
MCHC: 33 g/dL (ref 30.0–36.0)
MCV: 75.9 fl — ABNORMAL LOW (ref 78.0–100.0)
Neutro Abs: 3.6 10*3/uL (ref 1.4–7.7)
RBC: 4.21 Mil/uL — ABNORMAL LOW (ref 4.22–5.81)

## 2011-09-12 LAB — IRON AND TIBC
%SAT: 8 % — ABNORMAL LOW (ref 20–55)
TIBC: 425 ug/dL (ref 215–435)
UIBC: 392 ug/dL (ref 125–400)

## 2011-09-12 LAB — FERRITIN: Ferritin: 9.8 ng/mL — ABNORMAL LOW (ref 22.0–322.0)

## 2011-09-12 MED ORDER — PEG-KCL-NACL-NASULF-NA ASC-C 100 G PO SOLR: 1.0000 | Freq: Once | ORAL | Status: DC

## 2011-09-12 NOTE — Assessment & Plan Note (Addendum)
The patient has a microcytic anemia suggestive of an iron deficiency. In view of his recent episodes of rectal bleeding chronic GI blood  loss is a concern.  Recommendations #1 check iron, TIBC and ferritin #2 colonoscopy; if negative for GI bleeding source I would proceed with upper endoscopy

## 2011-09-12 NOTE — Patient Instructions (Signed)
Colonoscopy A colonoscopy is an exam to evaluate your entire colon. In this exam, your colon is cleansed. A long fiberoptic tube is inserted through your rectum and into your colon. The fiberoptic scope (endoscope) is a long bundle of enclosed and very flexible fibers. These fibers transmit light to the area examined and send images from that area to your caregiver. Discomfort is usually minimal. You may be given a drug to help you sleep (sedative) during or prior to the procedure. This exam helps to detect lumps (tumors), polyps, inflammation, and areas of bleeding. Your caregiver may also take a small piece of tissue (biopsy) that will be examined under a microscope. LET YOUR CAREGIVER KNOW ABOUT:   Allergies to food or medicine.   Medicines taken, including vitamins, herbs, eyedrops, over-the-counter medicines, and creams.   Use of steroids (by mouth or creams).   Previous problems with anesthetics or numbing medicines.   History of bleeding problems or blood clots.   Previous surgery.   Other health problems, including diabetes and kidney problems.   Possibility of pregnancy, if this applies.  BEFORE THE PROCEDURE   A clear liquid diet may be required for 2 days before the exam.   Ask your caregiver about changing or stopping your regular medications.   Liquid injections (enemas) or laxatives may be required.   A large amount of electrolyte solution may be given to you to drink over a short period of time. This solution is used to clean out your colon.   You should be present 60 minutes prior to your procedure or as directed by your caregiver.  AFTER THE PROCEDURE   If you received a sedative or pain relieving medication, you will need to arrange for someone to drive you home.   Occasionally, there is a little blood passed with the first bowel movement. Do not be concerned.  FINDING OUT THE RESULTS OF YOUR TEST Not all test results are available during your visit. If your test  results are not back during the visit, make an appointment with your caregiver to find out the results. Do not assume everything is normal if you have not heard from your caregiver or the medical facility. It is important for you to follow up on all of your test results. HOME CARE INSTRUCTIONS   It is not unusual to pass moderate amounts of gas and experience mild abdominal cramping following the procedure. This is due to air being used to inflate your colon during the exam. Walking or a warm pack on your belly (abdomen) may help.   You may resume all normal meals and activities after sedatives and medicines have worn off.   Only take over-the-counter or prescription medicines for pain, discomfort, or fever as directed by your caregiver. Do not use aspirin or blood thinners if a biopsy was taken. Consult your caregiver for medicine usage if biopsies were taken.  SEEK IMMEDIATE MEDICAL CARE IF:   You have a fever.   You pass large blood clots or fill a toilet with blood following the procedure. This may also occur 10 to 14 days following the procedure. This is more likely if a biopsy was taken.   You develop abdominal pain that keeps getting worse and cannot be relieved with medicine.  Document Released: 07/20/2000 Document Revised: 04/04/2011 Document Reviewed: 03/04/2008 Exit[employer] Patient Information 2012 [employer]. 

## 2011-09-12 NOTE — Assessment & Plan Note (Addendum)
Patient has had 2 episodes of recurrent , painless rectal bleeding.  Together with his microscopic red cell indices I have a concern that he has a bleeding colonic lesion such as an AVM, neoplasm or, less likely, diverticulum.  Recommendations #1 colonoscopy

## 2011-09-12 NOTE — Progress Notes (Signed)
History of Present Illness:  Mr. Misuraca is a pleasant 61 year old white male referred at the request of the ER for evaluation of rectal bleeding. On 2 occasions over the past 2 months he's had at least 2-3 days of bright red blood per rectum consisting of multiple bowel movements mixed with blood. He denies rectal or abdominal pain. In between he has felt well.  In January, 2012 hemoglobin was 10.5 and MCV 77. He is on no gastric irritants including nonsteroidals. He was taking Coumadin until his last bleed approximately 2 weeks ago. He's had no further bleeding.    Past Medical History  Diagnosis Date  . Hypertension   . Pulmonary embolism   . DVT (deep vein thrombosis) in pregnancy    Past Surgical History  Procedure Date  . None    family history includes Diabetes in his father. Current Outpatient Prescriptions  Medication Sig Dispense Refill  . aspirin EC 81 MG tablet Take 81 mg by mouth daily.      [name] atenolol (TENORMIN) 100 MG tablet Take 100 mg by mouth daily.      [name] doxazosin (CARDURA) 2 MG tablet Take 2 mg by mouth at bedtime.      [name] levothyroxine (SYNTHROID, LEVOTHROID) 25 MCG tablet Take 25 mcg by mouth daily.      . Multiple Vitamin (MULITIVITAMIN WITH MINERALS) TABS Take 1 tablet by mouth daily.      . simvastatin (ZOCOR) 40 MG tablet Take 40 mg by mouth every evening.      . warfarin (COUMADIN) 5 MG tablet Take 5 mg by mouth daily. Take as directed by anticoagulation clinic       Allergies as of 09/12/2011  . (No Known Allergies)    reports that he has never smoked. He has never used smokeless tobacco. He reports that he does not drink alcohol or use illicit drugs.     Review of Systems: He complains of chronic fatigue and swelling of his legs Pertinent positive and negative review of systems were noted in the above HPI section. All other review of systems were otherwise negative.  Vital signs were reviewed in today's medical record Physical Exam: General: He is  an obese male Head: Normocephalic and atraumatic Eyes:  sclerae anicteric, EOMI Ears: Normal auditory acuity Mouth: No deformity or lesions Neck: Supple, no masses or thyromegaly Lungs: Clear throughout to auscultation Heart: Regular rate and rhythm; no murmurs, rubs or bruits Abdomen: Soft, non tender and non distended. No masses, hepatosplenomegaly or hernias noted. Normal Bowel sounds Rectal:deferred Musculoskeletal: Symmetrical with no gross deformities  Skin: No lesions on visible extremities Pulses:  Normal pulses noted Extremities: There are chronic venous stasis changes. He has bilateral 2+ pedal edema Neurological: Alert oriented x 4, grossly nonfocal Cervical Nodes:  No significant cervical adenopathy Inguinal Nodes: No significant inguinal adenopathy Psychological:  Alert and cooperative. Normal mood and affect

## 2011-09-13 ENCOUNTER — Other Ambulatory Visit: Payer: Self-pay | Admitting: Gastroenterology

## 2011-09-13 MED ORDER — FERROUS SULFATE DRIED 200 (65 FE) MG PO TABS: ORAL_TABLET | ORAL | Status: DC

## 2011-09-13 NOTE — Telephone Encounter (Signed)
done

## 2011-09-13 NOTE — Telephone Encounter (Signed)
Med sent to day by Dr Arlyce Dice    L/M to inform pt that medication sent

## 2011-09-13 NOTE — Telephone Encounter (Signed)
Dr Arlyce Dice, I think maybe you forgot to send this patients iron pills

## 2011-09-14 ENCOUNTER — Ambulatory Visit (INDEPENDENT_AMBULATORY_CARE_PROVIDER_SITE_OTHER): Payer: Self-pay | Admitting: Internal Medicine

## 2011-09-14 ENCOUNTER — Ambulatory Visit: Payer: Self-pay | Admitting: Internal Medicine

## 2011-09-14 ENCOUNTER — Encounter: Payer: Self-pay | Admitting: Internal Medicine

## 2011-09-14 VITALS — BP 140/80 | HR 72 | Temp 98.2°F | Resp 16 | Ht 71.0 in | Wt 365.0 lb

## 2011-09-14 VITALS — BP 138/76 | HR 100 | Temp 98.1°F | Ht 71.5 in | Wt 369.0 lb

## 2011-09-14 DIAGNOSIS — I2699 Other pulmonary embolism without acute cor pulmonale: Secondary | ICD-10-CM

## 2011-09-14 DIAGNOSIS — Z0000 Encounter for general adult medical examination without abnormal findings: Secondary | ICD-10-CM

## 2011-09-14 DIAGNOSIS — Z0289 Encounter for other administrative examinations: Secondary | ICD-10-CM

## 2011-09-14 NOTE — Patient Instructions (Addendum)
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

## 2011-09-14 NOTE — Progress Notes (Signed)
Subjective:     Patient ID: Richard Warner, male   DOB: [birth year]   MRN: [id number]  HPI   Primary = Dr Langley Gauss in [place]  Brief patient profile:  7 yowm with morbid obesity never smoked no previous resp problems prior to PE 02/16/2010  HPI  February 22, 2010 1st pulmonary office eval cc abrupt onset sob 7/14 > [facility] dx PE and Left leg DVT discharged 7/16 with f/u at [facility] coumadin and lovenox bridge. breathing better. no pleuritic pain, no hemoptysis. H/o remote right ankle fx.   Mid Jan 2012 weak> sob  then to ER in [place] with bloody stools referred to [facility] with plans for endo March 4th and stopped coumadin Jan 22   09/14/11 Wert/ ov to discuss rx off coumadin, still on  Baby aspirin with no more bleeding but slt worse microcytic anemia on gi f/u 2/6.  Doe x exertion, not adls, no change in leg symptoms with tendency to swell if he's up and go down when he elevates. No cough  Sleeping ok without nocturnal  or early am exacerbation  of respiratory  c/o's or need for noct saba. Also denies any obvious fluctuation of symptoms with weather or environmental changes or other aggravating or alleviating factors except as outlined above.  ROS  At present neg for  any significant sore throat, dysphagia, itching, sneezing,  nasal congestion or excess/ purulent secretions,  fever, chills, sweats, unintended wt loss, pleuritic or exertional cp, hempoptysis, orthopnea pnd   Also denies presyncope, palpitations, heartburn, abdominal pain, nausea, vomiting, diarrhea  or change in bowel or urinary habits, dysuria,hematuria,  rash, arthralgias, visual complaints, headache, numbness weakness or ataxia.      Past Medical History:  Morbid Obesity  Hypertension  - Echo 02/17/10 wnl  Hyperlipidemia  PE/ R leg DVT s/p remote ankle fx  - CT [facility] 02/16/10    Social History:  Separated and lives with Mother  Moved to [place] from [facility]  Works for [employer]  Never smoker  No ETOH      Review of Systems     Objective:   Physical Exam    wt 346 February 22, 2010 vs 269 2/8/132 amb obese wm nad  HEENT: nl dentition, turbinates, and orophanx. Nl external ear canals without cough reflex  NECK : without JVD/Nodes/TM/ nl carotid upstrokes bilaterally  LUNGS: no acc muscle use, clear to A and P bilaterally without cough on insp or exp maneuvers  CV: RRR no s3 or murmur or increase in P2, no edema  ABD: soft and nontender with nl excursion in the supine position. No bruits or organomegaly, bowel sounds nl  MS: warm without deformities, calf tenderness, cyanosis or clubbing  SKIN: chronic venous stasis changes with hyperpigmentation both legs, trace edema R > L  NEURO: alert, approp, no deficits  Assessment:         Plan:

## 2011-09-14 NOTE — Progress Notes (Addendum)
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 He has a primary care physician and a specialist  his cardiologistand he just finished a physical examination with labs in the last 4 weeks. 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  He satisfies the DOT criteria for a one-year approval and hopes to retire after that year.

## 2011-09-15 ENCOUNTER — Encounter: Payer: Self-pay | Admitting: Internal Medicine

## 2011-09-15 NOTE — Assessment & Plan Note (Signed)
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

## 2011-09-17 ENCOUNTER — Encounter: Payer: Self-pay | Admitting: *Deleted

## 2011-10-05 ENCOUNTER — Encounter: Payer: Self-pay | Admitting: Internal Medicine

## 2011-10-05 ENCOUNTER — Other Ambulatory Visit: Payer: Self-pay

## 2011-10-05 ENCOUNTER — Ambulatory Visit (HOSPITAL_COMMUNITY): Payer: Medicaid Other | Attending: Internal Medicine

## 2011-10-05 ENCOUNTER — Other Ambulatory Visit (HOSPITAL_COMMUNITY): Payer: Self-pay

## 2011-10-05 DIAGNOSIS — I359 Nonrheumatic aortic valve disorder, unspecified: Secondary | ICD-10-CM | POA: Insufficient documentation

## 2011-10-05 DIAGNOSIS — E785 Hyperlipidemia, unspecified: Secondary | ICD-10-CM | POA: Insufficient documentation

## 2011-10-05 DIAGNOSIS — R0609 Other forms of dyspnea: Secondary | ICD-10-CM | POA: Insufficient documentation

## 2011-10-05 DIAGNOSIS — I2699 Other pulmonary embolism without acute cor pulmonale: Secondary | ICD-10-CM

## 2011-10-05 DIAGNOSIS — R0989 Other specified symptoms and signs involving the circulatory and respiratory systems: Secondary | ICD-10-CM

## 2011-10-08 ENCOUNTER — Encounter (HOSPITAL_COMMUNITY)
Admission: RE | Disposition: A | Discharge status: Home Health | Payer: Self-pay | Source: Ambulatory Visit | Attending: Gastroenterology

## 2011-10-08 ENCOUNTER — Encounter (HOSPITAL_COMMUNITY): Payer: Self-pay

## 2011-10-08 ENCOUNTER — Ambulatory Visit (HOSPITAL_COMMUNITY)
Admission: RE | Admit: 2011-10-08 | Discharge: 2011-10-08 | Disposition: A | Discharge status: Home Health | Payer: Medicaid Other | Source: Ambulatory Visit | Attending: Gastroenterology | Admitting: Gastroenterology

## 2011-10-08 ENCOUNTER — Other Ambulatory Visit: Payer: Self-pay | Admitting: Gastroenterology

## 2011-10-08 ENCOUNTER — Telehealth: Payer: Self-pay

## 2011-10-08 DIAGNOSIS — C187 Malignant neoplasm of sigmoid colon: Secondary | ICD-10-CM

## 2011-10-08 DIAGNOSIS — Z01812 Encounter for preprocedural laboratory examination: Secondary | ICD-10-CM | POA: Insufficient documentation

## 2011-10-08 DIAGNOSIS — D126 Benign neoplasm of colon, unspecified: Secondary | ICD-10-CM | POA: Insufficient documentation

## 2011-10-08 DIAGNOSIS — I10 Essential (primary) hypertension: Secondary | ICD-10-CM | POA: Insufficient documentation

## 2011-10-08 DIAGNOSIS — Z86711 Personal history of pulmonary embolism: Secondary | ICD-10-CM | POA: Insufficient documentation

## 2011-10-08 DIAGNOSIS — Z79899 Other long term (current) drug therapy: Secondary | ICD-10-CM | POA: Insufficient documentation

## 2011-10-08 DIAGNOSIS — Z7901 Long term (current) use of anticoagulants: Secondary | ICD-10-CM | POA: Insufficient documentation

## 2011-10-08 DIAGNOSIS — K6389 Other specified diseases of intestine: Secondary | ICD-10-CM

## 2011-10-08 DIAGNOSIS — Z86718 Personal history of other venous thrombosis and embolism: Secondary | ICD-10-CM | POA: Insufficient documentation

## 2011-10-08 DIAGNOSIS — Z7982 Long term (current) use of aspirin: Secondary | ICD-10-CM | POA: Insufficient documentation

## 2011-10-08 DIAGNOSIS — E669 Obesity, unspecified: Secondary | ICD-10-CM | POA: Insufficient documentation

## 2011-10-08 HISTORY — PX: COLONOSCOPY: SHX5424

## 2011-10-08 HISTORY — DX: Malignant neoplasm of sigmoid colon: C18.7

## 2011-10-08 SURGERY — COLONOSCOPY
Anesthesia: Moderate Sedation

## 2011-10-08 MED ORDER — SODIUM CHLORIDE 0.9 % IV SOLN
INTRAVENOUS | Status: DC
  Administered 2011-10-08: 13:00:00 via INTRAVENOUS

## 2011-10-08 MED ORDER — FENTANYL CITRATE 0.05 MG/ML IJ SOLN
INTRAMUSCULAR | Status: AC
  Filled 2011-10-08: qty 4

## 2011-10-08 MED ORDER — MIDAZOLAM HCL 10 MG/2ML IJ SOLN
INTRAMUSCULAR | Status: AC
  Filled 2011-10-08: qty 4

## 2011-10-08 MED ORDER — MIDAZOLAM HCL 5 MG/5ML IJ SOLN
INTRAMUSCULAR | Status: DC | PRN
  Administered 2011-10-08 (×2): 2 mg via INTRAVENOUS

## 2011-10-08 MED ORDER — SODIUM CHLORIDE 0.9 % IV SOLN: Freq: Once | INTRAVENOUS | Status: DC

## 2011-10-08 MED ORDER — FENTANYL NICU IV SYRINGE 50 MCG/ML
INJECTION | INTRAMUSCULAR | Status: DC | PRN
  Administered 2011-10-08 (×3): 25 ug via INTRAVENOUS

## 2011-10-08 MED ORDER — SPOT INK MARKER SYRINGE KIT
PACK | SUBMUCOSAL | Status: AC
  Filled 2011-10-08: qty 5

## 2011-10-08 NOTE — Telephone Encounter (Signed)
Richard Warner, The patient is alert he had a CT of the abdomen and pelvis. Please order just a CT of the chest    Pt is scheduled for CT of chest at [place] [facility] 10/11/11 arrival time 1:15pm for a 1:30pm appt. Called [facility] to make appt with Dr. Michaell Cowing. Spoke with his nurse and she will check with him regarding an appt date and time and call back tomorrow.

## 2011-10-08 NOTE — Progress Notes (Signed)
Quick Note:  Spoke with pt and notified of results per Dr. Sherene Sires. Pt verbalized understanding and denied any questions. rov with MW scheduled for 10/24/11 at 11:15 am ______

## 2011-10-08 NOTE — Telephone Encounter (Signed)
Message copied by Chrystie Nose on Mon Oct 08, 2011  2:31 PM ------      Message from: [name] D      Created: Mon Oct 08, 2011  1:25 PM       Brett Canales,      This patient has a newly diagnosed sigmoid CA. I have marked the area with [place] 8 and scheduled lab and CT scan.            Bonita Quin,      Please schedule a CT of the chest abdomen and pelvis and an appointment with Dr. Estelle Grumbles

## 2011-10-08 NOTE — Discharge Instructions (Signed)
Colonoscopy Care After These instructions give you information on caring for yourself after your procedure. Your doctor may also give you more specific instructions. Call your doctor if you have any problems or questions after your procedure. HOME CARE  Take it easy for the next 24 hours.   Rest.   Walk or use warm packs on your belly (abdomen) if you have belly cramping or gas.   Do not drive for 24 hours.   You may shower.   Do not sign important papers or use machinery for 24 hours.   Drink enough fluids to keep your pee (urine) clear or pale yellow.   Resume your normal diet. Avoid heavy or fried foods.   Avoid alcohol.   Continue taking your normal medicines.   Only take medicine as told by your doctor. Do not take aspirin.  If you had growths (polyps) removed:  Do not take aspirin.   Do not drink alcohol for 7 days or as told by your doctor.   Eat a soft diet for 24 hours.  GET HELP RIGHT AWAY IF:  You have a fever.   You pass clumps of tissue (blood clots) or fill the toilet with blood.   You have belly pain that gets worse and medicine does not help.   Your belly is puffy (swollen).   You feel sick to your stomach (nauseous) or throw up (vomit).  MAKE SURE YOU:  Understand these instructions.   Will watch your condition.   Will get help right away if you are not doing well or get worse.  Document Released: 08/25/2010 Document Revised: 07/12/2011 Document Reviewed: 08/25/2010 [facility] Patient Information 2012 [place], [place].    **My office will contact you to schedule a CT scan and surgical appointment

## 2011-10-08 NOTE — Op Note (Signed)
[facility] [address] [place], [place]  [address]  COLONOSCOPY PROCEDURE REPORT  PATIENT:  Richard Warner, Richard Warner  MR#:  [id number] BIRTHDATE:  1950-08-16, 60 yrs. old  GENDER:  male ENDOSCOPIST:  Barbette Hair. Arlyce Dice, MD REF. BY: PROCEDURE DATE:  10/08/2011 PROCEDURE:  Colonoscopy with snare polypectomy, Colon with cold biopsy polypectomy, Colonoscopy with submucosal injection ASA CLASS:  Class II INDICATIONS:  rectal bleeding MEDICATIONS:   These medications were titrated to patient response per physician's verbal order, Fentanyl 75 mcg IV, Versed 4 mg IV  DESCRIPTION OF PROCEDURE:   After the risks benefits and alternatives of the procedure were thoroughly explained, informed consent was obtained.  Digital rectal exam was performed and revealed no abnormalities.   The Pentax Colonoscope [id number] endoscope was introduced through the anus and advanced to the cecum, which was identified by both the appendix and ileocecal valve, without limitations.  The quality of the prep was excellent, using MoviPrep.  The instrument was then slowly withdrawn as the colon was fully examined. <<PROCEDUREIMAGES>>  FINDINGS:  A mass was found in the sigmoid colon. Partially circumferential mass beginning at 20cm from anus extending 2-3cm proximally. Mucosa is irregular and friable. Biopsies taken. [place] ink was injected submucosally at the proximal and distal margins of the mass (see image1 and image9).  Two polyps were found in the ascending colon. 3 and 5 mm sessile polyps were seen in the descending colon and removed with cold polypectomy snare and submitted to pathology (see image5 and image8).  A diminutive polyp was found in the proximal transverse colon (see image7). 1-56mm sessile polyp - removed with biopsy forceps  This was otherwise a normal examination of the colon (see image3 and image10).   Retroflexed views in the rectum revealed no abnormalities.    The time to cecum =   minutes. The scope was then withdrawn in  minutes from the cecum and the procedure completed. COMPLICATIONS:  None ENDOSCOPIC IMPRESSION: 1) Mass in the sigmoid colon 2) Two polyps in the ascending colon 3) Diminutive polyp in the proximal transverse colon 4) Otherwise normal examination RECOMMENDATIONS: 1) My office will arrange for you to have a CT scan of abdomen and pelvis. 2) My office will arrange for you to meet with a surgeon. 3) Colonoscopy 1 year REPEAT EXAM:  In 1 year(s) for Colonoscopy.  ______________________________ Barbette Hair. Arlyce Dice, MD  CC:  [name], MD, [name] MD  n. [name]:   [name] at 10/08/2011 01:22 PM  [name], [phone]

## 2011-10-08 NOTE — Telephone Encounter (Signed)
ok 

## 2011-10-08 NOTE — Interval H&P Note (Signed)
History and Physical Interval Note:  10/08/2011 12:36 PM  Richard Warner  has presented today for surgery, with the diagnosis of rectal bleeding  The various methods of treatment have been discussed with the patient and family. After consideration of risks, benefits and other options for treatment, the patient has consented to  Procedure(s) (LRB): COLONOSCOPY (N/A) as a surgical intervention .  The patients' history has been reviewed, patient examined, no change in status, stable for surgery.  I have reviewed the patients' chart and labs.  Questions were answered to the patient's satisfaction.     The recent H&P (dated *09/12/11**) was reviewed, the patient was examined and there is no change in the patients condition since that H&P was completed.   Richard Warner  10/08/2011, 12:37 PM   Richard Warner

## 2011-10-08 NOTE — Telephone Encounter (Signed)
Pt scheduled to see Dr. Michaell Cowing 10/17/11 arrival time 11:45am for a 12:15pm. Dr. Michaell Cowing requested a CEA be drawn, lab was drawn today.  Called pt and he states he will be out of town the 10th-18th. Appt with Dr. Michaell Cowing moved to 10/29/11 arrival time 2pm for a 2:30pm appt. Pt aware of appt date and time. Dr. Michaell Cowing' nurse may call pt with an earlier appt time if possible.

## 2011-10-08 NOTE — H&P (View-Only) (Signed)
History of Present Illness:  [name] is a pleasant 60-year-old white male referred at the request of the ER for evaluation of rectal bleeding. On 2 occasions over the past 2 months he's had at least 2-3 days of bright red blood per rectum consisting of multiple bowel movements mixed with blood. He denies rectal or abdominal pain. In between he has felt well.  In January, 2012 hemoglobin was 10.5 and MCV 77. He is on no gastric irritants including nonsteroidals. He was taking Coumadin until his last bleed approximately 2 weeks ago. He's had no further bleeding.    Past Medical History  Diagnosis Date  . Hypertension   . Pulmonary embolism   . DVT (deep vein thrombosis) in pregnancy    Past Surgical History  Procedure Date  . None    family history includes Diabetes in his father. Current Outpatient Prescriptions  Medication Sig Dispense Refill  . aspirin EC 81 MG tablet Take 81 mg by mouth daily.      . atenolol (TENORMIN) 100 MG tablet Take 100 mg by mouth daily.      . doxazosin (CARDURA) 2 MG tablet Take 2 mg by mouth at bedtime.      . levothyroxine (SYNTHROID, LEVOTHROID) 25 MCG tablet Take 25 mcg by mouth daily.      . Multiple Vitamin (MULITIVITAMIN WITH MINERALS) TABS Take 1 tablet by mouth daily.      . simvastatin (ZOCOR) 40 MG tablet Take 40 mg by mouth every evening.      . warfarin (COUMADIN) 5 MG tablet Take 5 mg by mouth daily. Take as directed by anticoagulation clinic       Allergies as of 09/12/2011  . (No Known Allergies)    reports that he has never smoked. He has never used smokeless tobacco. He reports that he does not drink alcohol or use illicit drugs.     Review of Systems: He complains of chronic fatigue and swelling of his legs Pertinent positive and negative review of systems were noted in the above HPI section. All other review of systems were otherwise negative.  Vital signs were reviewed in today's medical record Physical Exam: General: He is  an obese male Head: Normocephalic and atraumatic Eyes:  sclerae anicteric, EOMI Ears: Normal auditory acuity Mouth: No deformity or lesions Neck: Supple, no masses or thyromegaly Lungs: Clear throughout to auscultation Heart: Regular rate and rhythm; no murmurs, rubs or bruits Abdomen: Soft, non tender and non distended. No masses, hepatosplenomegaly or hernias noted. Normal Bowel sounds Rectal:deferred Musculoskeletal: Symmetrical with no gross deformities  Skin: No lesions on visible extremities Pulses:  Normal pulses noted Extremities: There are chronic venous stasis changes. He has bilateral 2+ pedal edema Neurological: Alert oriented x 4, grossly nonfocal Cervical Nodes:  No significant cervical adenopathy Inguinal Nodes: No significant inguinal adenopathy Psychological:  Alert and cooperative. Normal mood and affect       

## 2011-10-09 ENCOUNTER — Encounter (HOSPITAL_COMMUNITY): Payer: Self-pay | Admitting: Gastroenterology

## 2011-10-11 ENCOUNTER — Ambulatory Visit (INDEPENDENT_AMBULATORY_CARE_PROVIDER_SITE_OTHER)
Admission: RE | Admit: 2011-10-11 | Discharge: 2011-10-11 | Disposition: A | Discharge status: Home / Self Care | Payer: Self-pay | Source: Ambulatory Visit | Attending: Gastroenterology | Admitting: Gastroenterology

## 2011-10-11 ENCOUNTER — Encounter (INDEPENDENT_AMBULATORY_CARE_PROVIDER_SITE_OTHER): Payer: Self-pay

## 2011-10-11 DIAGNOSIS — R609 Edema, unspecified: Secondary | ICD-10-CM

## 2011-10-11 DIAGNOSIS — K6389 Other specified diseases of intestine: Secondary | ICD-10-CM

## 2011-10-11 DIAGNOSIS — Z86718 Personal history of other venous thrombosis and embolism: Secondary | ICD-10-CM

## 2011-10-11 DIAGNOSIS — I2699 Other pulmonary embolism without acute cor pulmonale: Secondary | ICD-10-CM

## 2011-10-11 MED ORDER — IOHEXOL 300 MG/ML  SOLN
80.0000 mL | Freq: Once | INTRAMUSCULAR | Status: AC | PRN
  Administered 2011-10-11: 80 mL via INTRAVENOUS

## 2011-10-12 ENCOUNTER — Encounter: Payer: Self-pay | Admitting: Internal Medicine

## 2011-10-12 NOTE — Progress Notes (Signed)
Quick Note:  Spoke with pt and notified of results per Dr. Wert. Pt verbalized understanding and denied any questions.  ______ 

## 2011-10-17 ENCOUNTER — Ambulatory Visit (INDEPENDENT_AMBULATORY_CARE_PROVIDER_SITE_OTHER): Payer: Self-pay | Admitting: Surgery

## 2011-10-18 ENCOUNTER — Other Ambulatory Visit (HOSPITAL_COMMUNITY): Payer: Self-pay

## 2011-10-24 ENCOUNTER — Encounter: Payer: Self-pay | Admitting: Internal Medicine

## 2011-10-24 ENCOUNTER — Ambulatory Visit (INDEPENDENT_AMBULATORY_CARE_PROVIDER_SITE_OTHER): Payer: Medicaid Other | Admitting: Internal Medicine

## 2011-10-24 VITALS — BP 130/70 | HR 65 | Temp 97.9°F | Ht 71.5 in | Wt 362.0 lb

## 2011-10-24 DIAGNOSIS — I2699 Other pulmonary embolism without acute cor pulmonale: Secondary | ICD-10-CM

## 2011-10-24 NOTE — Patient Instructions (Signed)
Continue aspirin 81 mg per day indefinitely  You are cleared for cancer surgery are high risk of future closets based on your work, your weight and colon cancer  Post op I recommend full anticoagulation and we could consider xarelto.

## 2011-10-24 NOTE — Progress Notes (Signed)
Subjective:     Patient ID: Richard Warner, male   DOB: 26-Jun-1951   MRN: [id number]   Primary = Dr Langley Gauss in [place]  Brief patient profile:  84 yowm with morbid obesity never smoked no previous resp problems prior to PE 02/16/2010  HPI  February 22, 2010 1st pulmonary office eval cc abrupt onset sob 7/14 > [facility] dx PE and Left leg DVT discharged 7/16 with f/u at [facility] coumadin and lovenox bridge. breathing better. no pleuritic pain, no hemoptysis. H/o remote right ankle fx.   Mid Jan 2012 weak> sob  then to ER in [place] with bloody stools referred to [facility] with plans for endo March 4th and stopped coumadin Jan 22   09/14/11 Richard Warner/ ov to discuss rx off coumadin, still on  Baby aspirin with no more bleeding but slt worse microcytic anemia on gi f/u 2/6.  Doe x exertion, not adls, no change in leg symptoms with tendency to swell if he's up and go down when he elevates. No cough rec Please see patient coordinator before you leave today  to schedule for Echocardiogram and Venous Dopplers  If you unexplained diffiulty with breathing or pain when you breath or sudden swelling of the either leg that doesn't go down when you elevate go to the ER  For now, continue the baby aspirin daily unless obvious bleeding in which case stop it  Wt loss is the longterm key to your health  W/u for anemia/gi bleed > pos colon ca > referred to [facility]  10/24/2011 f/u ov/Richard Warner pre op eval for possible colon surgery no sob but not very active,  No cough or leg swelling.  Sleeping ok without nocturnal  or early am exacerbation  of respiratory  c/o's or need for noct saba. Also denies any obvious fluctuation of symptoms with weather or environmental changes or other aggravating or alleviating factors except as outlined above.  ROS  At present neg for  any significant sore throat, dysphagia, itching, sneezing,  nasal congestion or excess/ purulent secretions,  fever, chills, sweats, unintended wt loss,  pleuritic or exertional cp, hempoptysis, orthopnea pnd   Also denies presyncope, palpitations, heartburn, abdominal pain, nausea, vomiting, diarrhea  or change in bowel or urinary habits, dysuria,hematuria,  rash, arthralgias, visual complaints, headache, numbness weakness or ataxia.      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 Nl external ear canals without cough reflex  NECK : without JVD/Nodes/TM/ nl carotid upstrokes bilaterally  LUNGS: no acc muscle use, clear to A and P bilaterally without cough on insp or exp maneuvers  CV: RRR no s3 or murmur or increase in P2, no edema  ABD: soft and nontender with nl excursion in the supine position. No bruits or organomegaly, bowel sounds nl  MS: warm without deformities,  calf tenderness, cyanosis or clubbing  SKIN: chronic venous stasis changes with hyperpigmentation both legs, trace edema R > L     Assessment:         Plan:

## 2011-10-25 NOTE — Assessment & Plan Note (Addendum)
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 However, no contraindication for surgery and clearly can't be re-anticoagulated anyway until the source of GIB is addressed.  I would favor using xarelto immediately post op (vs lovenox bridge to coumadin) but would welcome recs by heme/onc as well

## 2011-10-25 NOTE — Assessment & Plan Note (Signed)
BMI is almost 50 vs target of < 30  Discussed calorie balance issues with pt in detail

## 2011-10-29 ENCOUNTER — Encounter (INDEPENDENT_AMBULATORY_CARE_PROVIDER_SITE_OTHER): Payer: Self-pay | Admitting: Surgery

## 2011-10-29 ENCOUNTER — Ambulatory Visit (INDEPENDENT_AMBULATORY_CARE_PROVIDER_SITE_OTHER): Payer: Self-pay | Admitting: Surgery

## 2011-10-29 VITALS — BP 146/82 | HR 70 | Temp 97.9°F | Resp 18 | Ht 71.5 in | Wt 353.4 lb

## 2011-10-29 DIAGNOSIS — C187 Malignant neoplasm of sigmoid colon: Secondary | ICD-10-CM

## 2011-10-29 DIAGNOSIS — D689 Coagulation defect, unspecified: Secondary | ICD-10-CM | POA: Insufficient documentation

## 2011-10-29 NOTE — Patient Instructions (Signed)
Colorectal Cancer The colon is the large bowel and is the storage part of the bowel for undigested food. The large bowel is also called the intestine or gut. Cancer is a growth that is not supposed to be there.  RISK FACTORS Risks for cancer of the colon include:   Age. More than 90 percent of people with this disease are diagnosed after age 62.   Polyps. Growths on the inner wall of the colon or rectum may become cancerous.   Family history. Some cancers of the colon are inherited or run in families. These include:   Hereditary nonpolyposis colon cancer (HNPCC) is the most common type of inherited (genetic) colorectal cancer. It accounts for about 2 percent of all colorectal cancer cases. It is caused by genetic changes. About 75% of people with an altered HNPCC gene develop colon cancer, and the average age at diagnosis of colon cancer is 44.   Familial adenomatous polyposis (FAP) is a rare inherited condition in which hundreds of polyps form in the colon and rectum. It is caused by a change in a specific gene called APC. Unless FAP is treated, it usually leads to colorectal cancer by age 40. FAP accounts for less than 1 percent of all colorectal cancer cases.   Family members of people who have HNPCC or FAP can have genetic testing to check for specific genetic changes. For those who have changes in their genes, health care providers may suggest ways to try to reduce the risk of colorectal cancer or to improve the detection of this disease. For adults with FAP, the doctor may recommend an operation to remove all or part of the colon and rectum.   Personal history of colorectal cancer. A person who has already had colorectal cancer may develop colorectal cancer a second time. Also, women with a history of cancer of the ovary, uterus (endometrium), or breast are at a somewhat higher risk of developing colorectal cancer.   Ulcerative colitis or Crohn's disease. A person who has had a condition  that causes inflammation of the colon (such as ulcerative colitis or Crohn's disease) for many years is at increased risk of developing colorectal cancer.   Diet. Studies suggest that diets high in fat (especially animal fat) and low in calcium, folate, and fiber may increase the risk of colorectal cancer. Also, some studies suggest that people who eat a diet very low in fruits and vegetables may have a higher risk of colorectal cancer. More research is needed to better understand how diet affects the risk of colorectal cancer.   Cigarette smoking. A person who smokes cigarettes may be at increased risk of developing polyps and colorectal cancer.  SYMPTOMS  Changes in bowel habits.   Diarrhea, constipation, or feeling that the bowel does not empty completely.   Blood (either bright red or very dark) in the stool.   Stools that are narrower than usual.   General discomfort in your belly (abdomen): frequent gas pains, bloating, fullness, and/or cramps.   Weight loss with no known reason.   Constant tiredness.   Nausea and vomiting.  Other health problems can cause the same symptoms, and often these symptoms are not due to cancer. Anyone with these symptoms should see a doctor so that any problem can be diagnosed and treated as early as possible. Usually, early cancer does not cause pain. It is important not to wait to feel pain before seeing a doctor. DIAGNOSIS  If you have any signs or symptoms of   colorectal cancer, the doctor must determine whether they are due to cancer or some other cause. The doctor will ask about personal and family medical history and may do a physical exam. You may have one or more tests to screen for cancer. If the physical exam and test results do not suggest cancer, the doctor may decide that no further tests are needed and no treatment is necessary. Your caregiver may recommend a schedule for checkups. If X-rays show an abnormal area (such as a polyp), a piece of  tissue is taken (biopsy) to check for cancer cells. Often, the abnormal tissue can be removed during a test that examines the colon. These tests include:  Sigmoidoscopy. With this test, the caregiver can see inside your large intestine. A thin flexible tube is placed into your rectum. The device is called a sigmoidoscope. This device has a light and a tiny video camera in it. The caregiver uses the sigmoidoscope to look at the last third of your large intestine.   Colonoscopy. This test is like sigmoidoscopy, but the caregiver looks at all of the large intestine. It usually requires sedation.  If a biopsy was taken, a specialist in examining tissues (pathologist) checks the tissue for cancer cells using a microscope.  If the biopsy shows that cancer is present, the doctor needs to know the extent (stage) of the disease to plan the best treatment. The stage is based on whether the tumor has invaded nearby tissues, whether the cancer has spread, and if so, to what parts of the body. Staging may involve some of the following tests and procedures:  Blood tests. The doctor checks for carcinoembryonic antigen (CEA) and other substances in the blood. Some people who have colorectal cancer have a high CEA level.   Sigmoidoscopy.   Colonoscopy.   Endorectal ultrasound. An ultrasound probe is inserted into the rectum. The probe sends out sound waves that people cannot hear. The waves bounce off the rectum and nearby tissues, and a computer uses the echoes to create a picture. The picture shows how deep a rectal tumor has grown or whether the cancer has spread to lymph nodes or other nearby tissues.   Chest X-ray. X-rays of the chest can show whether cancer has spread to the lungs.   CT scan. This is a X-ray machine linked to a computer takes pictures of areas inside the body. The patient may receive an injection of dye. Tumors in the liver, lungs, or elsewhere in the body show up on the CT scan.  The doctor  also may use other tests (such as MRI) to see whether the cancer has spread. Sometimes staging is not complete until the patient has surgery to remove the tumor. (Surgery for colorectal cancer is described in the "Treatment" section.) DOCTORS DESCRIBE COLORECTAL CANCER BY THE FOLLOWING STAGES:   Stage 0. The cancer is found only in the innermost lining of the colon or rectum. Another name for Stage 0 colorectal cancer is Carcinoma in situ.   Stage I. The cancer has grown into the inner wall of the colon or rectum. The tumor has not reached the outer wall of the colon or extended outside the colon. Another name for Stage I colorectal cancer is Dukes' A.   Stage II. The tumor extends more deeply into or through the wall of the colon or rectum. It may have invaded nearby tissue, but cancer cells have not spread to the lymph nodes. Another name for Stage II colorectal cancer is   Dukes' B.   Stage III. The cancer has spread to nearby lymph nodes but not to other parts of the body. Another name for Stage III colorectal cancer is Dukes' C.   Stage IV. The cancer has spread to other parts of the body, such as the liver or lungs. Another name for Stage IV colorectal cancer is Dukes' D.   Recurrent cancer. This is cancer that has been treated and has returned after a period of time when the cancer could not be detected. The disease may return in the colon, rectum or in another part of the body.  Once you know the diagnosis and the stage of cancer of the colon, your caregiver can advise you and help you decide what the best course of treatment will be.  Document Released: 07/23/2005 Document Revised: 07/12/2011 Document Reviewed: 07/10/2011 [employer] Patient Information 2012 [employer]. 

## 2011-10-29 NOTE — Progress Notes (Signed)
Subjective:     Patient ID: Richard Warner, male   DOB: 1951/02/06, 61 y.o.   MRN: [id number]  HPI  Richard Warner  [birth year] [id number]  Patient Care Team: [name], MD as PCP - General (Family Medicine) [name], MD as Consulting Physician (Gastroenterology) [name], MD as Consulting Physician (Pulmonary Disease) [name], MD as Consulting Physician (Cardiology)  This patient is a 61 y.o.male who presents today for surgical evaluation at the request of Dr. Arlyce Dice.   Reason for visit: Newly diagnosed sigmoid cancer.  The patient is a pleasant morbidly obese male. Had a pulmonary embolism. Was fully anticoagulated. At rectal bleeding. Underwent colonoscopy. Was found to have a mass at 20 cm from the anus. Very suspicious. Biopsy consistent with cancer. CT scan shows no metastatic disease. He was sent to me for consideration of segmental resection.  He does not have constipation. He and his pain to recur with eschar. He recent recently finished anticoagulation for pulmonary embolism. Year-old and off on this given the fact that he will get return of his bleeding. No prior abdominal surgeries. He comes today with his mother. No fevers or chills. Weight has been stable. No severe anal pain.  Since stopped the anticoagulation, he is not had much in the way of rectal bleeding  Patient Active Problem List  Diagnoses  . HYPOTHYROIDISM  . DYSLIPIDEMIA  . MORBID OBESITY  . HYPERTENSION  . PULMONARY EMBOLISM AND INFARCTION  . RENAL DISEASE, CHRONIC, STAGE III  . DVT (deep venous thrombosis)  . Iron deficiency anemia, unspecified  . Benign neoplasm of colon  . Malignant neoplasm of sigmoid (flexure)  . Clotting disorder    Past Medical History  Diagnosis Date  . Hypertension   . Pulmonary embolism   . DVT (deep vein thrombosis) in pregnancy   . Sleep apnea 2011    during hospital stay heart rate dropped into 40's during sleep   . 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 Clotting disorder   . Hyperlipidemia   . Cough   . Leg swelling     Past Surgical History  Procedure Date  . None   . Colonoscopy 10/08/2011    Procedure: COLONOSCOPY;  Surgeon: [name], MD;  Location: [facility];  Service: Endoscopy;  Laterality: N/A;    History   Social History  . Marital Status: Single    Spouse Name: N/A    Number of Children: N/A  . Years of Education: N/A   Occupational History  . MERCHANT     SELF EMPLOYED   Social History Main Topics  . Smoking status: Never Smoker   . Smokeless tobacco: Never Used  . Alcohol Use: No  . Drug Use: No  . Sexually Active: Not on file   Other Topics Concern  . Not on file   Social History Narrative  . No narrative on file    Family History  Problem Relation Age of Onset  . Diabetes Father   . Heart disease Father   . Stroke Maternal Grandmother   . Stroke Maternal Grandfather   . Hypertension Paternal Grandfather     Current outpatient prescriptions:aspirin EC 81 MG tablet, Take 81 mg by mouth daily., Disp: , Rfl: ;  atenolol (TENORMIN) 100 MG tablet, Take 100 mg by mouth daily., Disp: , Rfl: ;  doxazosin (CARDURA) 2 MG tablet, Take 2 mg by mouth at bedtime., Disp: , Rfl: ;  Ferrous Sulfate Dried  200 (65 FE) MG TABS, Take 1 tab twice daily, Disp: , Rfl: ;  levothyroxine (SYNTHROID, LEVOTHROID) 25 MCG tablet, Take 25 mcg by mouth daily., Disp: , Rfl:  Multiple Vitamin (MULITIVITAMIN WITH MINERALS) TABS, Take 1 tablet by mouth daily., Disp: , Rfl: ;  simvastatin (ZOCOR) 40 MG tablet, Take 40 mg by mouth every evening., Disp: , Rfl:   No Known Allergies  BP 146/82  Pulse 70  Temp(Src) 97.9 F (36.6 C) (Temporal)  Resp 18  Ht 5' 11.5" (1.816 m)  Wt 353 lb 6.4 oz (160.301 kg)  BMI 48.60 kg/m2     Review of Systems  Constitutional: Negative for fever, chills and diaphoresis.  HENT: Negative for nosebleeds, sore throat, facial swelling, mouth sores,  trouble swallowing and ear discharge.   Eyes: Negative for photophobia, discharge and visual disturbance.  Respiratory: Positive for cough and shortness of breath. Negative for choking, chest tightness, wheezing and stridor.   Cardiovascular: Positive for leg swelling. Negative for chest pain and palpitations.  Gastrointestinal: Positive for anal bleeding. Negative for nausea, vomiting, abdominal pain, diarrhea, constipation, blood in stool, abdominal distention and rectal pain.  Genitourinary: Negative for dysuria, urgency, difficulty urinating and testicular pain.  Musculoskeletal: Positive for myalgias and arthralgias. Negative for back pain and gait problem.  Skin: Negative for color change, pallor, rash and wound.  Neurological: Negative for dizziness, speech difficulty, weakness, numbness and headaches.  Hematological: Negative for adenopathy. Does not bruise/bleed easily.  Psychiatric/Behavioral: Negative for hallucinations, confusion and agitation.       Objective:   Physical Exam  Constitutional: He is oriented to person, place, and time. He appears well-developed and well-nourished. No distress.  HENT:  Head: Normocephalic.  Mouth/Throat: Oropharynx is clear and moist. No oropharyngeal exudate.  Eyes: Conjunctivae and EOM are normal. Pupils are equal, round, and reactive to light. No scleral icterus.  Neck: Normal range of motion. Neck supple. No tracheal deviation present.  Cardiovascular: Normal rate, regular rhythm and intact distal pulses.   Pulmonary/Chest: Effort normal and breath sounds normal. No respiratory distress.  Abdominal: Soft. He exhibits no distension. There is no tenderness. Hernia confirmed negative in the right inguinal area and confirmed negative in the left inguinal area.  Musculoskeletal: Normal range of motion. He exhibits no tenderness.  Lymphadenopathy:    He has no cervical adenopathy.       Right: No inguinal adenopathy present.       Left: No  inguinal adenopathy present.  Neurological: He is alert and oriented to person, place, and time. No cranial nerve deficit. He exhibits normal muscle tone. Coordination normal.  Skin: Skin is warm and dry. No rash noted. He is not diaphoretic. No erythema. No pallor.  Psychiatric: He has a normal mood and affect. His behavior is normal. Judgment and thought content normal.       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 It is reasonable to start laparoscopically. Would probably use a GelPort for her wound extractor. On-Q pump to minimize pain. Probably do a stepdown unit bed postoperatively given his numerous issues.  Cardiac clearance.  Check CEA   I did discuss surgery he and his wife:  The anatomy & physiology of the digestive tract was discussed.  The pathophysiology was discussed.  Natural history risks without surgery was discussed.   I feel the risks  of no intervention will lead to serious problems that outweigh the operative risks; therefore, I recommended a partial colectomy to remove the pathology.  Laparoscopic & open techniques were discussed.   Risks such as bleeding, infection, abscess, leak, reoperation, possible ostomy, hernia, heart attack, death, and other risks were discussed.  I noted a good likelihood this will help address the problem.   Goals of post-operative recovery were discussed as well.  We will work to minimize complications.  An educational handout on the pathology was given as well.  Questions were answered.  The patient expresses understanding & wishes to proceed with surgery.

## 2011-10-31 ENCOUNTER — Telehealth (INDEPENDENT_AMBULATORY_CARE_PROVIDER_SITE_OTHER): Payer: Self-pay

## 2011-10-31 ENCOUNTER — Ambulatory Visit (INDEPENDENT_AMBULATORY_CARE_PROVIDER_SITE_OTHER): Payer: Self-pay | Admitting: Cardiovascular Disease

## 2011-10-31 ENCOUNTER — Encounter: Payer: Self-pay | Admitting: Cardiovascular Disease

## 2011-10-31 ENCOUNTER — Encounter: Payer: Self-pay | Admitting: *Deleted

## 2011-10-31 VITALS — BP 144/85 | HR 64 | Temp 97.0°F | Ht 71.5 in | Wt 356.0 lb

## 2011-10-31 DIAGNOSIS — I2699 Other pulmonary embolism without acute cor pulmonale: Secondary | ICD-10-CM

## 2011-10-31 DIAGNOSIS — C187 Malignant neoplasm of sigmoid colon: Secondary | ICD-10-CM

## 2011-10-31 NOTE — Progress Notes (Signed)
[name] Date of Birth  Jan 03, 1951 [facility]     [place] Office  [address]    [address]   [address] [place], [place]  [address]    [place], [place]  [address] [phone]  Fax  [phone]  [phone]  Fax [phone]  Problem List: 1. DVT / Pulmonary Embolus 2. Obesity.  History of Present Illness:  Angle is a 61 yo  With a hx of DVT /PE .  He is seen for pre-op visit prior to colon cancer surgery.  He does not have any true cardiac issues.    He has sleep apnea but doesn't wear a CPCP mask  Current Outpatient Prescriptions on File Prior to Visit  Medication Sig Dispense Refill  . aspirin EC 81 MG tablet Take 81 mg by mouth daily.      [name] atenolol (TENORMIN) 100 MG tablet Take 100 mg by mouth daily.      [name] doxazosin (CARDURA) 2 MG tablet Take 2 mg by mouth at bedtime.      . Ferrous Sulfate Dried 200 (65 FE) MG TABS Take 1 tab twice daily      . levothyroxine (SYNTHROID, LEVOTHROID) 25 MCG tablet Take 25 mcg by mouth daily.      . Multiple Vitamin (MULITIVITAMIN WITH MINERALS) TABS Take 1 tablet by mouth daily.      . simvastatin (ZOCOR) 40 MG tablet Take 40 mg by mouth every evening.        No Known Allergies  Past Medical History  Diagnosis Date  . Hypertension   . Pulmonary embolism   . DVT (deep vein thrombosis) in pregnancy   . Sleep apnea 2011    during hospital stay heart rate dropped into 40's during sleep   . 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 Clotting disorder   . Hyperlipidemia   . Cough   . Leg swelling     Past Surgical History  Procedure Date  . None   . Colonoscopy 10/08/2011    Procedure: COLONOSCOPY;  Surgeon: [name], MD;  Location: [facility];  Service: Endoscopy;  Laterality: N/A;    History  Smoking status  . Never Smoker   Smokeless tobacco  . Never Used    History  Alcohol Use No    Family History  Problem Relation Age of Onset  . Diabetes Father   . Heart  disease Father   . Stroke Maternal Grandmother   . Stroke Maternal Grandfather   . Hypertension Paternal Grandfather     Reviw of Systems:  Reviewed in the HPI.  All other systems are negative.  Physical Exam: Blood pressure 144/85, pulse 64, temperature 97 F (36.1 C), height 5' 11.5" (1.816 m), weight 356 lb (161.481 kg). General: Well developed, obese gentleman,   Head: Normocephalic, atraumatic, sclera non-icteric, mucus membranes are moist,   Neck: Supple. Carotids are 2 + without bruits. No JVD  Lungs: Clear bilaterally to auscultation.  Heart: regular rate.  No murmurs  Abdomen: Soft, non-tender, non-distended with normal bowel sounds. No hepatomegaly. No rebound/guarding. No masses.  Msk:  Strength and tone are normal  Extremities: he has 1-2+ pitting edema with chronic stasis changes.  I Was not able to feel distal foot pulses.  He has a small ulceration on his right lower leg  Neuro: Alert and oriented X 3. Moves all extremities spontaneously.  Psych:  Responds to questions appropriately with a normal affect.  ECG: From  10/05/11 - NSR. No St or T wave changes  Assessment / Plan:

## 2011-10-31 NOTE — Assessment & Plan Note (Signed)
Richard Warner presents today for preoperative evaluation prior to having colon cancer surgery.  He has a history of a deep vein thrombosis as well as a pulmonary embolus. He's not had any specific cardiac problems. He had an echo Several weeks ago which revealed normal left ventricular systolic function. His right ventricle is fairly normal.  From my standpoint I don't think that he is at any extra risk for from a cardiovascular standpoint. He does have a history of obesity, pulmonary and was, and sleep apnea. He's already been cleared by the pulmonary division.  I've asked him to come see [place] again as needed.

## 2011-10-31 NOTE — Patient Instructions (Signed)
Your physician recommends that you schedule a follow-up appointment in: AS NEEDED BASIS  CLEARED FOR SURGERY/ SEE LETTER.

## 2011-10-31 NOTE — Telephone Encounter (Signed)
Called pt to notify him of his appt with [facility] cardiology today with Dr Elease Hashimoto at 3pm. I notified the pt of his CEA labs being normal per Dr Michaell Cowing. I notified the pt that I will call him once we receive the cardiac clearance to get him scheduled for sx. The pt understands.

## 2011-11-01 ENCOUNTER — Telehealth (INDEPENDENT_AMBULATORY_CARE_PROVIDER_SITE_OTHER): Payer: Self-pay

## 2011-11-01 NOTE — Telephone Encounter (Signed)
Called pt's home to notify pt that we received cardiac clearance from Dr Harvie Bridge office and we will go ahead with scheduling the surgery.

## 2011-11-07 ENCOUNTER — Encounter (HOSPITAL_COMMUNITY): Payer: Self-pay | Admitting: Pharmacy Technician

## 2011-11-12 ENCOUNTER — Other Ambulatory Visit (INDEPENDENT_AMBULATORY_CARE_PROVIDER_SITE_OTHER): Payer: Self-pay | Admitting: Surgery

## 2011-11-14 ENCOUNTER — Inpatient Hospital Stay (HOSPITAL_COMMUNITY): Admission: RE | Admit: 2011-11-14 | Payer: Self-pay | Source: Ambulatory Visit

## 2011-11-20 ENCOUNTER — Encounter (HOSPITAL_COMMUNITY): Admission: RE | Payer: Self-pay | Source: Ambulatory Visit

## 2011-11-20 ENCOUNTER — Ambulatory Visit (HOSPITAL_COMMUNITY): Admission: RE | Admit: 2011-11-20 | Payer: Self-pay | Source: Ambulatory Visit | Admitting: Surgery

## 2011-11-20 SURGERY — COLECTOMY, SIGMOID, LAPAROSCOPIC
Anesthesia: General

## 2011-11-23 ENCOUNTER — Telehealth (INDEPENDENT_AMBULATORY_CARE_PROVIDER_SITE_OTHER): Payer: Self-pay

## 2011-11-23 DIAGNOSIS — C189 Malignant neoplasm of colon, unspecified: Secondary | ICD-10-CM

## 2011-11-23 NOTE — Telephone Encounter (Signed)
LMOM that order in epic.

## 2011-11-28 ENCOUNTER — Ambulatory Visit: Payer: Self-pay | Admitting: Pharmacist

## 2011-11-28 ENCOUNTER — Telehealth: Payer: Self-pay | Admitting: Oncology

## 2011-11-28 ENCOUNTER — Institutional Professional Consult (permissible substitution): Payer: Self-pay | Admitting: Cardiology

## 2011-11-28 DIAGNOSIS — I2699 Other pulmonary embolism without acute cor pulmonale: Secondary | ICD-10-CM

## 2011-11-28 DIAGNOSIS — I82409 Acute embolism and thrombosis of unspecified deep veins of unspecified lower extremity: Secondary | ICD-10-CM

## 2011-11-28 NOTE — Telephone Encounter (Signed)
called pt and scheduled appt for 05/28. faxed over a letter to Dr. Michaell Cowing with appt d/t

## 2011-11-30 ENCOUNTER — Telehealth: Payer: Self-pay | Admitting: Oncology

## 2011-11-30 NOTE — Telephone Encounter (Signed)
Dx-Colon 

## 2011-12-03 ENCOUNTER — Encounter (HOSPITAL_COMMUNITY): Payer: Self-pay | Admitting: Pharmacy Technician

## 2011-12-03 ENCOUNTER — Encounter (HOSPITAL_COMMUNITY)
Admission: RE | Admit: 2011-12-03 | Discharge: 2011-12-03 | Disposition: A | Discharge status: Home / Self Care | Payer: Medicaid Other | Source: Ambulatory Visit | Attending: Surgery | Admitting: Surgery

## 2011-12-03 ENCOUNTER — Encounter (HOSPITAL_COMMUNITY): Payer: Self-pay

## 2011-12-03 DIAGNOSIS — M1990 Unspecified osteoarthritis, unspecified site: Secondary | ICD-10-CM

## 2011-12-03 DIAGNOSIS — R0602 Shortness of breath: Secondary | ICD-10-CM

## 2011-12-03 DIAGNOSIS — D649 Anemia, unspecified: Secondary | ICD-10-CM

## 2011-12-03 DIAGNOSIS — I2699 Other pulmonary embolism without acute cor pulmonale: Secondary | ICD-10-CM

## 2011-12-03 DIAGNOSIS — N189 Chronic kidney disease, unspecified: Secondary | ICD-10-CM

## 2011-12-03 DIAGNOSIS — M7989 Other specified soft tissue disorders: Secondary | ICD-10-CM

## 2011-12-03 DIAGNOSIS — T07XXXA Unspecified multiple injuries, initial encounter: Secondary | ICD-10-CM

## 2011-12-03 HISTORY — DX: Unspecified multiple injuries, initial encounter: T07.XXXA

## 2011-12-03 HISTORY — DX: Chronic kidney disease, unspecified: N18.9

## 2011-12-03 HISTORY — PX: TONSILLECTOMY: SUR1361

## 2011-12-03 HISTORY — DX: Anemia, unspecified: D64.9

## 2011-12-03 HISTORY — DX: Other specified soft tissue disorders: M79.89

## 2011-12-03 HISTORY — DX: Shortness of breath: R06.02

## 2011-12-03 HISTORY — DX: Unspecified osteoarthritis, unspecified site: M19.90

## 2011-12-03 HISTORY — DX: Other pulmonary embolism without acute cor pulmonale: I26.99

## 2011-12-03 LAB — BASIC METABOLIC PANEL
Calcium: 9.4 mg/dL (ref 8.4–10.5)
Creatinine, Ser: 1.61 mg/dL — ABNORMAL HIGH (ref 0.50–1.35)
GFR calc Af Amer: 52 mL/min — ABNORMAL LOW (ref >90)
GFR calc non Af Amer: 45 mL/min — ABNORMAL LOW (ref >90)
Sodium: 141 mEq/L (ref 135–145)

## 2011-12-03 LAB — CBC
MCH: 28.3 pg (ref 26.0–34.0)
MCV: 84.2 fL (ref 78.0–100.0)
Platelets: 151 10*3/uL (ref 150–400)
RDW: 18.7 % — ABNORMAL HIGH (ref 11.5–15.5)

## 2011-12-03 LAB — SURGICAL PCR SCREEN: MRSA, PCR: NEGATIVE

## 2011-12-03 MED ORDER — CHLORHEXIDINE GLUCONATE 4 % EX LIQD
1.0000 "application " | Freq: Once | CUTANEOUS | Status: DC
  Filled 2011-12-03: qty 15

## 2011-12-03 NOTE — Progress Notes (Signed)
12/03/11 1222  OBSTRUCTIVE SLEEP APNEA  Score 4 or greater  No PCP

## 2011-12-03 NOTE — Pre-Procedure Instructions (Addendum)
12-03-11 EKG/CXR 08-28-11 viewable in Epic. CT Chest (10-11-11)viewalble in Epic.

## 2011-12-03 NOTE — Patient Instructions (Signed)
20 Richard Warner  12/03/2011   Your procedure is scheduled on:   5-10 -2013  Report to [facility] at     0800   AM..  Call this number if you have problems the morning of surgery: [phone]   Remember:   Do not eat food:After Midnight.  Follow bowel prep instructions as per office instructions    Take these medicines the morning of surgery with A SIP OF WATER: Levothyroxine AM of, Take Atenolol (pm of as usual) and other Pm meds.   Do not wear jewelry, make-up or nail polish.  Do not wear lotions, powders, or perfumes. You may wear deodorant.  Do not shave 48 hours prior to surgery.(face and neck okay, no shaving of legs)  Do not bring valuables to the hospital.  Contacts, dentures or bridgework may not be worn into surgery.  Leave suitcase in the car. After surgery it may be brought to your room.  For patients admitted to the hospital, checkout time is 11:00 AM the day of discharge.   Patients discharged the day of surgery will not be allowed to drive home.  Name and phone number of your driver: Mother  Special Instructions: CHG Shower Use Special Wash: 1/2 bottle night before surgery and 1/2 bottle morning of surgery.(avoid face and genitals)   Please read over the following fact sheets that you were given: MRSA Information, Blood Transfusion fact sheet, Incentive Spirometry Instruction.

## 2011-12-13 MED ORDER — BUPIVACAINE 0.25 % ON-Q PUMP DUAL CATH 300 ML
300.0000 mL | INJECTION | Status: DC
  Filled 2011-12-13: qty 300

## 2011-12-14 ENCOUNTER — Encounter (HOSPITAL_COMMUNITY): Payer: Self-pay | Admitting: Certified Registered Nurse Anesthetist

## 2011-12-14 ENCOUNTER — Inpatient Hospital Stay (HOSPITAL_COMMUNITY)
Admission: RE | Admit: 2011-12-14 | Discharge: 2011-12-19 | DRG: 333 | Disposition: A | Discharge status: Home / Self Care | Payer: Medicaid Other | Source: Ambulatory Visit | Attending: Surgery | Admitting: Surgery

## 2011-12-14 ENCOUNTER — Encounter (HOSPITAL_COMMUNITY): Payer: Self-pay

## 2011-12-14 ENCOUNTER — Encounter (HOSPITAL_COMMUNITY)
Admission: RE | Disposition: A | Discharge status: Home / Self Care | Payer: Self-pay | Source: Ambulatory Visit | Attending: Surgery

## 2011-12-14 ENCOUNTER — Ambulatory Visit (HOSPITAL_COMMUNITY): Payer: Medicaid Other | Admitting: Certified Registered Nurse Anesthetist

## 2011-12-14 DIAGNOSIS — D126 Benign neoplasm of colon, unspecified: Secondary | ICD-10-CM | POA: Diagnosis present

## 2011-12-14 DIAGNOSIS — N289 Disorder of kidney and ureter, unspecified: Secondary | ICD-10-CM | POA: Diagnosis present

## 2011-12-14 DIAGNOSIS — C187 Malignant neoplasm of sigmoid colon: Secondary | ICD-10-CM

## 2011-12-14 DIAGNOSIS — C20 Malignant neoplasm of rectum: Principal | ICD-10-CM | POA: Diagnosis present

## 2011-12-14 DIAGNOSIS — Z86718 Personal history of other venous thrombosis and embolism: Secondary | ICD-10-CM | POA: Diagnosis present

## 2011-12-14 DIAGNOSIS — I2782 Chronic pulmonary embolism: Secondary | ICD-10-CM | POA: Diagnosis present

## 2011-12-14 DIAGNOSIS — C189 Malignant neoplasm of colon, unspecified: Secondary | ICD-10-CM

## 2011-12-14 DIAGNOSIS — Z01812 Encounter for preprocedural laboratory examination: Secondary | ICD-10-CM

## 2011-12-14 DIAGNOSIS — E66813 Obesity, class 3: Secondary | ICD-10-CM | POA: Diagnosis present

## 2011-12-14 DIAGNOSIS — E785 Hyperlipidemia, unspecified: Secondary | ICD-10-CM | POA: Insufficient documentation

## 2011-12-14 DIAGNOSIS — Z6841 Body Mass Index (BMI) 40.0 and over, adult: Secondary | ICD-10-CM

## 2011-12-14 DIAGNOSIS — E039 Hypothyroidism, unspecified: Secondary | ICD-10-CM | POA: Diagnosis present

## 2011-12-14 DIAGNOSIS — I10 Essential (primary) hypertension: Secondary | ICD-10-CM | POA: Diagnosis present

## 2011-12-14 DIAGNOSIS — Z7901 Long term (current) use of anticoagulants: Secondary | ICD-10-CM

## 2011-12-14 DIAGNOSIS — G4730 Sleep apnea, unspecified: Secondary | ICD-10-CM | POA: Diagnosis present

## 2011-12-14 HISTORY — DX: Malignant neoplasm of colon, unspecified: C18.9

## 2011-12-14 HISTORY — PX: LOW ANTERIOR BOWEL RESECTION: SUR1240

## 2011-12-14 HISTORY — PX: PROCTOSCOPY: SHX2266

## 2011-12-14 LAB — CBC
MCV: 84 fL (ref 78.0–100.0)
Platelets: 156 10*3/uL (ref 150–400)
RDW: 16.8 % — ABNORMAL HIGH (ref 11.5–15.5)
WBC: 15.4 10*3/uL — ABNORMAL HIGH (ref 4.0–10.5)

## 2011-12-14 LAB — CREATININE, SERUM
GFR calc Af Amer: 53 mL/min — ABNORMAL LOW (ref >90)
GFR calc non Af Amer: 46 mL/min — ABNORMAL LOW (ref >90)

## 2011-12-14 LAB — TYPE AND SCREEN
ABO/RH(D): A POS
Antibody Screen: NEGATIVE

## 2011-12-14 SURGERY — RESECTION, RECTUM, LOW ANTERIOR, LAPAROSCOPIC
Anesthesia: General | Site: Rectum | Wound class: Contaminated

## 2011-12-14 MED ORDER — HYDROMORPHONE HCL PF 1 MG/ML IJ SOLN
INTRAMUSCULAR | Status: DC | PRN
  Administered 2011-12-14 (×3): 1 mg via INTRAVENOUS

## 2011-12-14 MED ORDER — LIP MEDEX EX OINT
1.0000 "application " | TOPICAL_OINTMENT | Freq: Two times a day (BID) | CUTANEOUS | Status: DC
  Administered 2011-12-14 – 2011-12-19 (×9): 1 via TOPICAL
  Filled 2011-12-14 (×3): qty 7

## 2011-12-14 MED ORDER — BUPIVACAINE-EPINEPHRINE 0.25% -1:200000 IJ SOLN
INTRAMUSCULAR | Status: DC | PRN
  Administered 2011-12-14: 50 mL

## 2011-12-14 MED ORDER — PSYLLIUM 95 % PO PACK
1.0000 | PACK | Freq: Two times a day (BID) | ORAL | Status: DC
  Administered 2011-12-14 – 2011-12-19 (×10): 1 via ORAL
  Filled 2011-12-14 (×11): qty 1

## 2011-12-14 MED ORDER — ACETAMINOPHEN 10 MG/ML IV SOLN
INTRAVENOUS | Status: AC
  Filled 2011-12-14: qty 100

## 2011-12-14 MED ORDER — HYDROMORPHONE HCL PF 1 MG/ML IJ SOLN
0.2500 mg | INTRAMUSCULAR | Status: DC | PRN
  Administered 2011-12-14 (×3): 0.5 mg via INTRAVENOUS

## 2011-12-14 MED ORDER — HYDROMORPHONE HCL PF 1 MG/ML IJ SOLN
0.5000 mg | INTRAMUSCULAR | Status: DC | PRN
  Administered 2011-12-14: 1 mg via INTRAVENOUS
  Administered 2011-12-15 – 2011-12-16 (×8): 2 mg via INTRAVENOUS
  Filled 2011-12-14: qty 2
  Filled 2011-12-14: qty 1
  Filled 2011-12-14 (×7): qty 2

## 2011-12-14 MED ORDER — LACTATED RINGERS IV SOLN: INTRAVENOUS | Status: DC

## 2011-12-14 MED ORDER — METOPROLOL TARTRATE 12.5 MG HALF TABLET
12.5000 mg | ORAL_TABLET | Freq: Two times a day (BID) | ORAL | Status: DC | PRN
  Filled 2011-12-14: qty 1

## 2011-12-14 MED ORDER — SODIUM CHLORIDE 0.9 % IV SOLN
INTRAVENOUS | Status: AC
  Filled 2011-12-14: qty 1

## 2011-12-14 MED ORDER — HEPARIN SODIUM (PORCINE) 5000 UNIT/ML IJ SOLN
5000.0000 [IU] | Freq: Once | INTRAMUSCULAR | Status: AC
  Administered 2011-12-14: 5000 [IU] via SUBCUTANEOUS

## 2011-12-14 MED ORDER — LACTATED RINGERS IV SOLN
INTRAVENOUS | Status: DC
  Administered 2011-12-14: 1000 mL via INTRAVENOUS

## 2011-12-14 MED ORDER — ONDANSETRON HCL 4 MG/2ML IJ SOLN
4.0000 mg | Freq: Four times a day (QID) | INTRAMUSCULAR | Status: DC | PRN
  Administered 2011-12-16: 4 mg via INTRAVENOUS
  Filled 2011-12-14: qty 2

## 2011-12-14 MED ORDER — DIPHENHYDRAMINE HCL 50 MG/ML IJ SOLN: 12.5000 mg | Freq: Four times a day (QID) | INTRAMUSCULAR | Status: DC | PRN

## 2011-12-14 MED ORDER — HEPARIN SODIUM (PORCINE) 5000 UNIT/ML IJ SOLN
5000.0000 [IU] | Freq: Three times a day (TID) | INTRAMUSCULAR | Status: DC
  Administered 2011-12-15 – 2011-12-19 (×13): 5000 [IU] via SUBCUTANEOUS
  Filled 2011-12-14 (×16): qty 1

## 2011-12-14 MED ORDER — LABETALOL HCL 5 MG/ML IV SOLN
INTRAVENOUS | Status: DC | PRN
  Administered 2011-12-14 (×4): 5 mg via INTRAVENOUS

## 2011-12-14 MED ORDER — ALVIMOPAN 12 MG PO CAPS
12.0000 mg | ORAL_CAPSULE | Freq: Two times a day (BID) | ORAL | Status: DC
  Administered 2011-12-15 – 2011-12-17 (×6): 12 mg via ORAL
  Filled 2011-12-14 (×8): qty 1

## 2011-12-14 MED ORDER — LEVOTHYROXINE SODIUM 25 MCG PO TABS
25.0000 ug | ORAL_TABLET | Freq: Every day | ORAL | Status: DC
  Administered 2011-12-15 – 2011-12-19 (×5): 25 ug via ORAL
  Filled 2011-12-14 (×6): qty 1

## 2011-12-14 MED ORDER — BUPIVACAINE 0.25 % ON-Q PUMP DUAL CATH 300 ML
300.0000 mL | INJECTION | Status: AC
  Filled 2011-12-14 (×2): qty 300

## 2011-12-14 MED ORDER — MAGIC MOUTHWASH
15.0000 mL | Freq: Four times a day (QID) | ORAL | Status: DC | PRN
  Filled 2011-12-14: qty 15

## 2011-12-14 MED ORDER — DEXTROSE IN LACTATED RINGERS 5 % IV SOLN
INTRAVENOUS | Status: DC
  Administered 2011-12-15: 100 mL/h via INTRAVENOUS
  Administered 2011-12-15 – 2011-12-16 (×4): via INTRAVENOUS

## 2011-12-14 MED ORDER — HYDROMORPHONE HCL PF 1 MG/ML IJ SOLN
INTRAMUSCULAR | Status: AC
  Filled 2011-12-14: qty 1

## 2011-12-14 MED ORDER — BUPIVACAINE-EPINEPHRINE 0.25% -1:200000 IJ SOLN
INTRAMUSCULAR | Status: AC
  Filled 2011-12-14: qty 1

## 2011-12-14 MED ORDER — ACETAMINOPHEN 10 MG/ML IV SOLN
INTRAVENOUS | Status: DC | PRN
  Administered 2011-12-14: 1000 mg via INTRAVENOUS

## 2011-12-14 MED ORDER — DOXAZOSIN MESYLATE 2 MG PO TABS
2.0000 mg | ORAL_TABLET | Freq: Every day | ORAL | Status: DC
  Administered 2011-12-14 – 2011-12-18 (×5): 2 mg via ORAL
  Filled 2011-12-14 (×6): qty 1

## 2011-12-14 MED ORDER — GLYCOPYRROLATE 0.2 MG/ML IJ SOLN
INTRAMUSCULAR | Status: DC | PRN
  Administered 2011-12-14: 0.6 mg via INTRAVENOUS

## 2011-12-14 MED ORDER — SACCHAROMYCES BOULARDII 250 MG PO CAPS
250.0000 mg | ORAL_CAPSULE | Freq: Two times a day (BID) | ORAL | Status: DC
  Administered 2011-12-14 – 2011-12-19 (×10): 250 mg via ORAL
  Filled 2011-12-14 (×11): qty 1

## 2011-12-14 MED ORDER — PROMETHAZINE HCL 25 MG/ML IJ SOLN: 6.2500 mg | INTRAMUSCULAR | Status: DC | PRN

## 2011-12-14 MED ORDER — SODIUM CHLORIDE 0.9 % IV SOLN
1.0000 g | INTRAVENOUS | Status: AC
  Administered 2011-12-14: 1 g via INTRAVENOUS

## 2011-12-14 MED ORDER — FENTANYL CITRATE 0.05 MG/ML IJ SOLN
INTRAMUSCULAR | Status: DC | PRN
  Administered 2011-12-14 (×3): 100 ug via INTRAVENOUS
  Administered 2011-12-14: 50 ug via INTRAVENOUS
  Administered 2011-12-14: 100 ug via INTRAVENOUS
  Administered 2011-12-14: 50 ug via INTRAVENOUS

## 2011-12-14 MED ORDER — SPOT INK MARKER SYRINGE KIT
PACK | SUBMUCOSAL | Status: DC | PRN
  Administered 2011-12-14: 5 mL via SUBMUCOSAL

## 2011-12-14 MED ORDER — ALVIMOPAN 12 MG PO CAPS
12.0000 mg | ORAL_CAPSULE | Freq: Once | ORAL | Status: AC
Start: 2011-12-14 — End: 2011-12-14
  Administered 2011-12-14: 12 mg via ORAL

## 2011-12-14 MED ORDER — SUCCINYLCHOLINE CHLORIDE 20 MG/ML IJ SOLN
INTRAMUSCULAR | Status: DC | PRN
  Administered 2011-12-14: 160 mg via INTRAVENOUS

## 2011-12-14 MED ORDER — LACTATED RINGERS IV BOLUS (SEPSIS): 1000.0000 mL | Freq: Three times a day (TID) | INTRAVENOUS | Status: AC | PRN

## 2011-12-14 MED ORDER — ALUM & MAG HYDROXIDE-SIMETH 200-200-20 MG/5ML PO SUSP: 30.0000 mL | Freq: Four times a day (QID) | ORAL | Status: DC | PRN

## 2011-12-14 MED ORDER — LIDOCAINE HCL (CARDIAC) 20 MG/ML IV SOLN
INTRAVENOUS | Status: DC | PRN
  Administered 2011-12-14: 80 mg via INTRAVENOUS

## 2011-12-14 MED ORDER — LACTATED RINGERS IV SOLN
INTRAVENOUS | Status: DC | PRN
  Administered 2011-12-14 (×4): via INTRAVENOUS

## 2011-12-14 MED ORDER — ACETAMINOPHEN 325 MG PO TABS
650.0000 mg | ORAL_TABLET | Freq: Four times a day (QID) | ORAL | Status: DC
  Administered 2011-12-14 – 2011-12-16 (×9): 650 mg via ORAL
  Administered 2011-12-16: 325 mg via ORAL
  Administered 2011-12-17 – 2011-12-19 (×9): 650 mg via ORAL
  Filled 2011-12-14 (×2): qty 2
  Filled 2011-12-14: qty 1
  Filled 2011-12-14 (×31): qty 2

## 2011-12-14 MED ORDER — ONDANSETRON HCL 4 MG/2ML IJ SOLN
INTRAMUSCULAR | Status: DC | PRN
  Administered 2011-12-14: 4 mg via INTRAVENOUS

## 2011-12-14 MED ORDER — SPOT INK MARKER SYRINGE KIT
PACK | SUBMUCOSAL | Status: AC
Start: 2011-12-14 — End: 2011-12-14
  Filled 2011-12-14: qty 5

## 2011-12-14 MED ORDER — ADULT MULTIVITAMIN W/MINERALS CH
1.0000 | ORAL_TABLET | Freq: Every day | ORAL | Status: DC
  Administered 2011-12-14 – 2011-12-19 (×6): 1 via ORAL
  Filled 2011-12-14 (×6): qty 1

## 2011-12-14 MED ORDER — HEPARIN SODIUM (PORCINE) 5000 UNIT/ML IJ SOLN
INTRAMUSCULAR | Status: AC
  Administered 2011-12-14: 5000 [IU] via SUBCUTANEOUS
  Filled 2011-12-14: qty 1

## 2011-12-14 MED ORDER — NEOSTIGMINE METHYLSULFATE 1 MG/ML IJ SOLN
INTRAMUSCULAR | Status: DC | PRN
  Administered 2011-12-14: 5 mg via INTRAVENOUS

## 2011-12-14 MED ORDER — PROPOFOL 10 MG/ML IV BOLUS
INTRAVENOUS | Status: DC | PRN
  Administered 2011-12-14: 300 mg via INTRAVENOUS

## 2011-12-14 MED ORDER — ASPIRIN EC 81 MG PO TBEC
81.0000 mg | DELAYED_RELEASE_TABLET | Freq: Every day | ORAL | Status: DC
  Administered 2011-12-14 – 2011-12-19 (×6): 81 mg via ORAL
  Filled 2011-12-14 (×6): qty 1

## 2011-12-14 MED ORDER — METOPROLOL TARTRATE 12.5 MG HALF TABLET
12.5000 mg | ORAL_TABLET | Freq: Two times a day (BID) | ORAL | Status: DC
  Administered 2011-12-14 – 2011-12-16 (×5): 12.5 mg via ORAL
  Filled 2011-12-14 (×7): qty 1

## 2011-12-14 MED ORDER — ROCURONIUM BROMIDE 100 MG/10ML IV SOLN
INTRAVENOUS | Status: DC | PRN
  Administered 2011-12-14 (×2): 10 mg via INTRAVENOUS
  Administered 2011-12-14: 50 mg via INTRAVENOUS
  Administered 2011-12-14 (×3): 10 mg via INTRAVENOUS
  Administered 2011-12-14: 20 mg via INTRAVENOUS

## 2011-12-14 MED ORDER — ONDANSETRON HCL 4 MG PO TABS: 4.0000 mg | ORAL_TABLET | Freq: Four times a day (QID) | ORAL | Status: DC | PRN

## 2011-12-14 MED ORDER — ALVIMOPAN 12 MG PO CAPS
ORAL_CAPSULE | ORAL | Status: AC
  Administered 2011-12-14: 12 mg via ORAL
  Filled 2011-12-14: qty 1

## 2011-12-14 MED ORDER — PROMETHAZINE HCL 25 MG/ML IJ SOLN
12.5000 mg | Freq: Four times a day (QID) | INTRAMUSCULAR | Status: DC | PRN
  Administered 2011-12-16: 25 mg via INTRAVENOUS
  Filled 2011-12-14: qty 1

## 2011-12-14 SURGICAL SUPPLY — 94 items
APPLIER CLIP ROT 10 11.4 M/L (STAPLE)
APR CLP MED LRG 11.4X10 (STAPLE)
BLADE SURG 10 STRL SS (BLADE) ×4 IMPLANT
BLADE SURG ROTATE 9660 (MISCELLANEOUS) ×2 IMPLANT
CANISTER SUCTION 2500CC (MISCELLANEOUS) ×4 IMPLANT
CATH KIT ON Q 10IN SLV (PAIN MANAGEMENT) ×4 IMPLANT
CELLS DAT CNTRL 66122 CELL SVR (MISCELLANEOUS) IMPLANT
CHLORAPREP W/TINT 26ML (MISCELLANEOUS) ×4 IMPLANT
CLIP APPLIE ROT 10 11.4 M/L (STAPLE) IMPLANT
CLOTH BEACON ORANGE TIMEOUT ST (SAFETY) ×4 IMPLANT
COVER SURGICAL LIGHT HANDLE (MISCELLANEOUS) ×4 IMPLANT
DECANTER SPIKE VIAL GLASS SM (MISCELLANEOUS) ×8 IMPLANT
DISSECTOR BLUNT TIP ENDO 5MM (MISCELLANEOUS) IMPLANT
DRAIN CHANNEL 19F RND (DRAIN) ×2 IMPLANT
DRAPE INCISE IOBAN 85X60 (DRAPES) ×2 IMPLANT
DRAPE LG THREE QUARTER DISP (DRAPES) ×2 IMPLANT
DRAPE PROXIMA HALF (DRAPES) ×4 IMPLANT
DRAPE UTILITY 15X26 W/TAPE STR (DRAPE) ×8 IMPLANT
DRAPE WARM FLUID 44X44 (DRAPE) ×8 IMPLANT
DRSG TEGADERM 2-3/8X2-3/4 SM (GAUZE/BANDAGES/DRESSINGS) ×6 IMPLANT
DRSG TEGADERM 4X4.75 (GAUZE/BANDAGES/DRESSINGS) ×2 IMPLANT
ELECT CAUTERY BLADE 6.4 (BLADE) ×4 IMPLANT
ELECT REM PT RETURN 9FT ADLT (ELECTROSURGICAL) ×4
ELECTRODE REM PT RTRN 9FT ADLT (ELECTROSURGICAL) ×3 IMPLANT
ENDOLOOP SUT PDS II  0 18 (SUTURE) ×4
ENDOLOOP SUT PDS II 0 18 (SUTURE) ×4 IMPLANT
EVACUATOR SILICONE 100CC (DRAIN) ×2 IMPLANT
GAUZE PACKING IODOFORM 1 (PACKING) ×2 IMPLANT
GEL ULTRASOUND 20GR AQUASONIC (MISCELLANEOUS) IMPLANT
GLOVE BIO SURGEON STRL SZ 6 (GLOVE) ×8 IMPLANT
GLOVE BIOGEL PI IND STRL 6.5 (GLOVE) ×3 IMPLANT
GLOVE BIOGEL PI IND STRL 7.0 (GLOVE) ×1 IMPLANT
GLOVE BIOGEL PI INDICATOR 6.5 (GLOVE) ×1
GLOVE BIOGEL PI INDICATOR 7.0 (GLOVE) ×1
GLOVE SURG ORTHO 8.0 STRL STRW (GLOVE) ×6 IMPLANT
GOWN PREVENTION PLUS XXLARGE (GOWN DISPOSABLE) ×8 IMPLANT
GOWN STRL NON-REIN LRG LVL3 (GOWN DISPOSABLE) ×8 IMPLANT
KIT BASIN OR (CUSTOM PROCEDURE TRAY) ×4 IMPLANT
KIT ROOM TURNOVER OR (KITS) ×4 IMPLANT
LEGGING LITHOTOMY PAIR STRL (DRAPES) ×2 IMPLANT
LIGASURE 5MM LAPAROSCOPIC (INSTRUMENTS) IMPLANT
LIGASURE IMPACT 36 18CM CVD LR (INSTRUMENTS) IMPLANT
LUBRICANT JELLY K Y 4OZ (MISCELLANEOUS) ×8 IMPLANT
NS IRRIG 1000ML POUR BTL (IV SOLUTION) ×12 IMPLANT
PAD ARMBOARD 7.5X6 YLW CONV (MISCELLANEOUS) ×8 IMPLANT
PENCIL BUTTON HOLSTER BLD 10FT (ELECTRODE) ×6 IMPLANT
RETRACTOR WND ALEXIS 18 MED (MISCELLANEOUS) IMPLANT
RTRCTR WOUND ALEXIS 18CM MED (MISCELLANEOUS)
SCALPEL HARMONIC ACE (MISCELLANEOUS) IMPLANT
SCISSORS LAP 5X35 DISP (ENDOMECHANICALS) ×2 IMPLANT
SET IRRIG TUBING LAPAROSCOPIC (IRRIGATION / IRRIGATOR) IMPLANT
SLEEVE ADV FIXATION 5X100MM (TROCAR) ×2 IMPLANT
SLEEVE ENDOPATH XCEL 5M (ENDOMECHANICALS) ×4 IMPLANT
SPECIMEN JAR LARGE (MISCELLANEOUS) ×4 IMPLANT
SPONGE GAUZE 4X4 12PLY (GAUZE/BANDAGES/DRESSINGS) ×4 IMPLANT
SPONGE LAP 18X18 X RAY DECT (DISPOSABLE) ×2 IMPLANT
STAPLER CIRC ILS CVD 33MM 37CM (STAPLE) ×2 IMPLANT
STAPLER CUT CVD 40MM BLUE (STAPLE) ×2 IMPLANT
STAPLER CUT RELOAD BLUE (STAPLE) ×2 IMPLANT
STAPLER VISISTAT 35W (STAPLE) ×4 IMPLANT
SUCTION POOLE TIP (SUCTIONS) ×4 IMPLANT
SURGILUBE 2OZ TUBE FLIPTOP (MISCELLANEOUS) IMPLANT
SUT ETHILON 2 0 PS N (SUTURE) ×2 IMPLANT
SUT MNCRL AB 4-0 PS2 18 (SUTURE) ×2 IMPLANT
SUT PDS AB 1 CT  36 (SUTURE)
SUT PDS AB 1 CT 36 (SUTURE) IMPLANT
SUT PROLENE 0 SH 30 (SUTURE) ×2 IMPLANT
SUT PROLENE 2 0 CT2 30 (SUTURE) IMPLANT
SUT PROLENE 2 0 KS (SUTURE) IMPLANT
SUT SILK 2 0 (SUTURE) ×4
SUT SILK 2 0 SH CR/8 (SUTURE) ×4 IMPLANT
SUT SILK 2-0 18XBRD TIE 12 (SUTURE) ×3 IMPLANT
SUT SILK 3 0 (SUTURE) ×4
SUT SILK 3 0 SH CR/8 (SUTURE) ×4 IMPLANT
SUT SILK 3-0 18XBRD TIE 12 (SUTURE) ×3 IMPLANT
SUT VICRYL 0 ENDOLOOP (SUTURE) ×2 IMPLANT
SYS LAPSCP GELPORT 120MM (MISCELLANEOUS) ×4
SYSTEM LAPSCP GELPORT 120MM (MISCELLANEOUS) ×1 IMPLANT
TOWEL OR 17X24 6PK STRL BLUE (TOWEL DISPOSABLE) ×4 IMPLANT
TOWEL OR 17X26 10 PK STRL BLUE (TOWEL DISPOSABLE) ×8 IMPLANT
TRAY FOLEY CATH 14FRSI W/METER (CATHETERS) ×2 IMPLANT
TRAY LAPAROSCOPIC (CUSTOM PROCEDURE TRAY) ×4 IMPLANT
TRAY PROCTOSCOPIC FIBER OPTIC (SET/KITS/TRAYS/PACK) IMPLANT
TROCAR ADV FIXATION 5X100MM (TROCAR) ×4 IMPLANT
TROCAR XCEL 12X100 BLDLESS (ENDOMECHANICALS) IMPLANT
TROCAR XCEL BLUNT TIP 100MML (ENDOMECHANICALS) ×4 IMPLANT
TROCAR XCEL NON-BLD 11X100MML (ENDOMECHANICALS) IMPLANT
TROCAR XCEL NON-BLD 5MMX100MML (ENDOMECHANICALS) ×4 IMPLANT
TUBE CONNECTING 12X1/4 (SUCTIONS) ×4 IMPLANT
TUBING CONNECTING 10 (TUBING) ×4 IMPLANT
TUBING FILTER THERMOFLATOR (ELECTROSURGICAL) ×4 IMPLANT
WATER STERILE IRR 1000ML POUR (IV SOLUTION) ×4 IMPLANT
YANKAUER SUCT BULB TIP 10FT TU (MISCELLANEOUS) ×2 IMPLANT
YANKAUER SUCT BULB TIP NO VENT (SUCTIONS) ×8 IMPLANT

## 2011-12-14 NOTE — H&P (Signed)
Richard Warner  10/22/1950 [id number]  Patient Care Team: [name], MD as PCP - General (Family Medicine) [name], MD as Consulting Physician (Gastroenterology) [name], MD as Consulting Physician (Pulmonary Disease) [name], MD as Consulting Physician (Cardiology)  This patient is a 61 y.o.male who presents today for surgical evaluation at the request of Dr. Arlyce Dice.   Reason for visit: Newly diagnosed sigmoid cancer.   The patient is a pleasant morbidly obese male. Had a pulmonary embolism. Was fully anticoagulated. Developed rectal bleeding. Underwent colonoscopy. Was found to have a mass at 20 cm from the anus. Very suspicious. Biopsy consistent with cancer. CT scan shows no metastatic disease. He was sent to me for consideration of segmental resection.   He does not have constipation. He and his pain to recur with eschar. He recent recently finished anticoagulation for pulmonary embolism. Year-old and off on this given the fact that he will get return of his bleeding. No prior abdominal surgeries. He comes today with his mother. No fevers or chills. Weight has been stable. No severe anal pain. Since stopped the anticoagulation, he is not had much in the way of rectal bleeding.  No new events.  Cardiac clearance done  Patient Active Problem List  Diagnoses  . HYPOTHYROIDISM  . DYSLIPIDEMIA  . MORBID OBESITY  . HYPERTENSION  . PULMONARY EMBOLISM AND INFARCTION  . RENAL DISEASE, CHRONIC, STAGE III  . DVT (deep venous thrombosis)  . Iron deficiency anemia, unspecified  . Benign neoplasm of colon  . Malignant neoplasm of sigmoid (flexure)  . Clotting disorder    Past Medical History  Diagnosis Date  . Hypertension   . Pulmonary embolism 12-03-11    01-17-10 s/p DVT left leg -Coumadin tx.until 08-28-11  . DVT (deep vein thrombosis) in pregnancy 12-03-11    6'2011-left leg  . 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 Hyperlipidemia     . Leg swelling 12-03-11    bilateral if up most of day,equally swells, up to knee level  . Shortness of breath 12-03-11    hx. 6'11- during pulmonary emboli episode, none now, extreme exertion only.  . Anemia 12-03-11    tx. oral iron supplement  . Chronic kidney disease 12-03-11    kidney stone x1 ,none recent  . Arthritis 12-03-11    mild lower back  . Fractures 12-03-11    s/p right ankle and right wrist- no problems now    Past Surgical History  Procedure Date  . Colonoscopy 10/08/2011    Procedure: COLONOSCOPY;  Surgeon: [name], MD;  Location: [facility];  Service: Endoscopy;  Laterality: N/A;  . Tonsillectomy 12-03-11    child    History   Social History  . Marital Status: Single    Spouse Name: N/A    Number of Children: N/A  . Years of Education: N/A   Occupational History  . MERCHANT     SELF EMPLOYED   Social History Main Topics  . Smoking status: Never Smoker   . Smokeless tobacco: Never Used  . Alcohol Use: No  . Drug Use: No  . Sexually Active: Not on file   Other Topics Concern  . Not on file   Social History Narrative  . No narrative on file    Family History  Problem Relation Age of Onset  . Diabetes Father   . Heart disease Father   . Stroke Maternal Grandmother   . Stroke Maternal Grandfather   .  Hypertension Paternal Grandfather     Current Facility-Administered Medications  Medication Dose Route Frequency Provider Last Rate Last Dose  . alvimopan (ENTEREG) capsule 12 mg  12 mg Oral Once [name], MD   12 mg at 12/14/11 0837  . bupivacaine 0.25 % ON-Q pump DUAL CATH 300 mL  300 mL Other Continuous [name], MD      . ertapenem [facility]) 1 g in sodium chloride 0.9 % 50 mL IVPB  1 g Intravenous 60 min Pre-Op [name], MD      . heparin injection 5,000 Units  5,000 Units Subcutaneous Once [name], MD   5,000 Units at 12/14/11 [id number]  . lactated ringers infusion   Intravenous Continuous Azell Der, MD 125  mL/hr at 12/14/11 0929 1,000 mL at 12/14/11 0929     No Known Allergies  BP 163/79  Pulse 57  Temp 97.9 F (36.6 C)  Resp 20  SpO2 95%  No results found.    Review of Systems  Constitutional: Negative for fever, chills and diaphoresis.  HENT: Negative for nosebleeds, sore throat, facial swelling, mouth sores, trouble swallowing and ear discharge.  Eyes: Negative for photophobia, discharge and visual disturbance.  Respiratory: Positive for cough and shortness of breath. Negative for choking, chest tightness, wheezing and stridor.  Cardiovascular: Positive for leg swelling. Negative for chest pain and palpitations.  Gastrointestinal: Positive for anal bleeding. Negative for nausea, vomiting, abdominal pain, diarrhea, constipation, blood in stool, abdominal distention and rectal pain.  Genitourinary: Negative for dysuria, urgency, difficulty urinating and testicular pain.  Musculoskeletal: Positive for myalgias and arthralgias. Negative for back pain and gait problem.  Skin: Negative for color change, pallor, rash and wound.  Neurological: Negative for dizziness, speech difficulty, weakness, numbness and headaches.  Hematological: Negative for adenopathy. Does not bruise/bleed easily.  Psychiatric/Behavioral: Negative for hallucinations, confusion and agitation.  Objective:   Physical Exam  Constitutional: He is oriented to person, place, and time. He appears well-developed and well-nourished. No distress.  HENT:  Head: Normocephalic.  Mouth/Throat: Oropharynx is clear and moist. No oropharyngeal exudate.  Eyes: Conjunctivae and EOM are normal. Pupils are equal, round, and reactive to light. No scleral icterus.  Neck: Normal range of motion. Neck supple. No tracheal deviation present.  Cardiovascular: Normal rate, regular rhythm and intact distal pulses.  Pulmonary/Chest: Effort normal and breath sounds normal. No respiratory distress.  Abdominal: Soft. He exhibits no distension.  There is no tenderness. Hernia confirmed negative in the right inguinal area and confirmed negative in the left inguinal area.  Musculoskeletal: Normal range of motion. He exhibits no tenderness.  Lymphadenopathy:  He has no cervical adenopathy.  Right: No inguinal adenopathy present.  Left: No inguinal adenopathy present.  Neurological: He is alert and oriented to person, place, and time. No cranial nerve deficit. He exhibits normal muscle tone. Coordination normal.  Skin: Skin is warm and dry. No rash noted. He is not diaphoretic. No erythema. No pallor.  Psychiatric: He has a normal mood and affect. His behavior is normal. Judgment and thought content normal.  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 It is reasonable to start laparoscopically. Would probably use a GelPort for a wound extractor. On-Q pump to minimize pain. Probably do a stepdown unit bed postoperatively given his numerous issues.   Cardiac clearance done   The anatomy &  physiology of the digestive tract was discussed. The pathophysiology was discussed. Natural history risks without surgery was discussed. I feel the risks of no intervention will lead to serious problems that outweigh the operative risks; therefore, I recommended a partial colectomy to remove the pathology. Laparoscopic & open techniques were discussed.  Risks such as bleeding, infection, abscess, leak, reoperation, possible ostomy, hernia, heart attack, death, and other risks were discussed. I noted a good likelihood this will help address the problem. Goals of post-operative recovery were discussed as well. We will work to minimize complications. An educational handout on the pathology was given as well. Questions were answered. The patient expresses understanding & wishes to proceed with surgery.

## 2011-12-14 NOTE — Transfer of Care (Signed)
Immediate Anesthesia Transfer of Care Note  Patient: Richard Warner  Procedure(s) Performed: Procedure(s) (LRB): LAPAROSCOPIC LOW ANTERIOR RESECTION (N/A) PROCTOSCOPY (N/A)  Patient Location: PACU  Anesthesia Type: General  Level of Consciousness: awake and alert   Airway & Oxygen Therapy: Patient Spontanous Breathing and Patient connected to face mask oxygen  Post-op Assessment: Report given to PACU RN and Post -op Vital signs reviewed and stable  Post vital signs: Reviewed and stable  Complications: No apparent anesthesia complications

## 2011-12-14 NOTE — Anesthesia Postprocedure Evaluation (Signed)
  Anesthesia Post-op Note  Patient: Richard Warner  Procedure(s) Performed: Procedure(s) (LRB): LAPAROSCOPIC LOW ANTERIOR RESECTION (N/A) PROCTOSCOPY (N/A)  Patient Location: PACU  Anesthesia Type: General  Level of Consciousness: oriented and sedated  Airway and Oxygen Therapy: Patient Spontanous Breathing and Patient connected to nasal cannula oxygen  Post-op Pain: mild  Post-op Assessment: Post-op Vital signs reviewed, Patient's Cardiovascular Status Stable, Respiratory Function Stable and Patent Airway  Post-op Vital Signs: stable  Complications: No apparent anesthesia complications

## 2011-12-14 NOTE — Anesthesia Preprocedure Evaluation (Addendum)
Anesthesia Evaluation  Patient identified by MRN, date of birth, ID band Patient awake    Reviewed: Allergy & Precautions, H&P , NPO status , Patient's Chart, lab work & pertinent test results  Airway Mallampati: II TM Distance: >3 FB Neck ROM: Full    Dental No notable dental hx.    Pulmonary shortness of breath, sleep apnea ,  breath sounds clear to auscultation  Pulmonary exam normal       Cardiovascular hypertension, Pt. on home beta blockers and Pt. on medications Rhythm:Regular Rate:Normal  Dr. Elease Hashimoto and Dr. Thurston Hole notes reviewed. H/O DVT and PE in 2011   Neuro/Psych negative neurological ROS  negative psych ROS   GI/Hepatic negative GI ROS, Neg liver ROS,   Endo/Other  Hypothyroidism Morbid obesity  Renal/GU Renal InsufficiencyRenal diseaseCr 1.61  negative genitourinary   Musculoskeletal negative musculoskeletal ROS (+)   Abdominal (+) + obese,   Peds negative pediatric ROS (+)  Hematology negative hematology ROS (+)   Anesthesia Other Findings   Reproductive/Obstetrics negative OB ROS                       Anesthesia Physical Anesthesia Plan  ASA: III  Anesthesia Plan: General   Post-op Pain Management:    Induction: Intravenous  Airway Management Planned: Oral ETT  Additional Equipment:   Intra-op Plan:   Post-operative Plan: Extubation in OR  Informed Consent: I have reviewed the patients History and Physical, chart, labs and discussed the procedure including the risks, benefits and alternatives for the proposed anesthesia with the patient or authorized representative who has indicated his/her understanding and acceptance.   Dental advisory given  Plan Discussed with: CRNA  Anesthesia Plan Comments:         Anesthesia Quick Evaluation

## 2011-12-14 NOTE — Discharge Instructions (Signed)
GENERAL SURGERY: POST OP INSTRUCTIONS  1. DIET: Follow a light bland diet the first 24 hours after arrival home, such as soup, liquids, crackers, etc.  Be sure to include lots of fluids daily.  Avoid fast food or heavy meals as your are more likely to get nauseated.   2. Take your usually prescribed home medications unless otherwise directed. 3. PAIN CONTROL: a. Pain is best controlled by a usual combination of three different methods TOGETHER: i. Ice/Heat ii. Over the counter pain medication iii. Prescription pain medication b. Most patients will experience some swelling and bruising around the incisions.  Ice packs or heating pads (30-60 minutes up to 6 times a day) will help. Use ice for the first few days to help decrease swelling and bruising, then switch to heat to help relax tight/sore spots and speed recovery.  Some people prefer to use ice alone, heat alone, alternating between ice & heat.  Experiment to what works for you.  Swelling and bruising can take several weeks to resolve.   c. It is helpful to take an over-the-counter pain medication regularly for the first few weeks.  Choose one of the following that works best for you: i. Naproxen (Aleve, etc)  Two 220mg tabs twice a day ii. Ibuprofen (Advil, etc) Three 200mg tabs four times a day (every meal & bedtime) iii. Acetaminophen (Tylenol, etc) 500-650mg four times a day (every meal & bedtime) d. A  prescription for pain medication (such as oxycodone, hydrocodone, etc) should be given to you upon discharge.  Take your pain medication as prescribed.  i. If you are having problems/concerns with the prescription medicine (does not control pain, nausea, vomiting, rash, itching, etc), please call us [phone] to see if we need to switch you to a different pain medicine that will work better for you and/or control your side effect better. ii. If you need a refill on your pain medication, please contact your pharmacy.  They will contact our  office to request authorization. Prescriptions will not be filled after 5 pm or on week-ends. 4. Avoid getting constipated.  Between the surgery and the pain medications, it is common to experience some constipation.  Increasing fluid intake and taking a fiber supplement (such as Metamucil, Citrucel, FiberCon, MiraLax, etc) 1-2 times a day regularly will usually help prevent this problem from occurring.  A mild laxative (prune juice, Milk of Magnesia, MiraLax, etc) should be taken according to package directions if there are no bowel movements after 48 hours.   5. Wash / shower every day.  You may shower over the dressings as they are waterproof.  Continue to shower over incision(s) after the dressing is off. 6. Remove your waterproof bandages 5 days after surgery.  You may leave the incision open to air.  You may have skin tapes (Steri Strips) covering the incision(s).  Leave them on until one week, then remove.  You may replace a dressing/Band-Aid to cover the incision for comfort if you wish.      7. ACTIVITIES as tolerated:   a. You may resume regular (light) daily activities beginning the next day-such as daily self-care, walking, climbing stairs-gradually increasing activities as tolerated.  If you can walk 30 minutes without difficulty, it is safe to try more intense activity such as jogging, treadmill, bicycling, low-impact aerobics, swimming, etc. b. Save the most intensive and strenuous activity for last such as sit-ups, heavy lifting, contact sports, etc  Refrain from any heavy lifting or straining until you   are off narcotics for pain control.   c. DO NOT PUSH THROUGH PAIN.  Let pain be your guide: If it hurts to do something, don't do it.  Pain is your body warning you to avoid that activity for another week until the pain goes down. d. You may drive when you are no longer taking prescription pain medication, you can comfortably wear a seatbelt, and you can safely maneuver your car and apply  brakes. e. You may have sexual intercourse when it is comfortable.  8. FOLLOW UP in our office a. Please call [facility] at [phone] to set up an appointment to see your surgeon in the office for a follow-up appointment approximately 2-3 weeks after your surgery. b. Make sure that you call for this appointment the day you arrive home to insure a convenient appointment time. 9. IF YOU HAVE DISABILITY OR FAMILY LEAVE FORMS, BRING THEM TO THE OFFICE FOR PROCESSING.  DO NOT GIVE THEM TO YOUR DOCTOR.   WHEN TO CALL US [phone]: 1. Poor pain control 2. Reactions / problems with new medications (rash/itching, nausea, etc)  3. Fever over 101.5 F (38.5 C) 4. Worsening swelling or bruising 5. Continued bleeding from incision. 6. Increased pain, redness, or drainage from the incision   The clinic staff is available to answer your questions during regular business hours (8:30am-5pm).  Please don't hesitate to call and ask to speak to one of our nurses for clinical concerns.   If you have a medical emergency, go to the nearest emergency room or call 911.  A surgeon from [facility] is always on call at the hospitals   [facility] [address], [address], [place], [place]  [address] ? MAIN: [phone] ? TOLL FREE: [phone] ?  FAX [phone] [email or web address]  

## 2011-12-14 NOTE — Care Management Note (Unsigned)
    Page 1 of 1   12/14/2011     5:23:30 PM   CARE MANAGEMENT NOTE 12/14/2011  Patient:  THANE, AGE   Account Number:  [id number]  Date Initiated:  12/14/2011  Documentation initiated by:  Lanier Clam  Subjective/Objective Assessment:   ADMITTED W/NEW ZO:XWRUEAV CA/SURGICAL EVAL.     Action/Plan:   FROM HOME   Anticipated DC Date:  12/20/2011   Anticipated DC Plan:  HOME/SELF CARE         Choice offered to / List presented to:             Status of service:  In process, will continue to follow Medicare Important Message given?   (If response is "NO", the following Medicare IM given date fields will be blank) Date Medicare IM given:   Date Additional Medicare IM given:    Discharge Disposition:    Per UR Regulation:  Reviewed for med. necessity/level of care/duration of stay  If discussed at Long Length of Stay Meetings, dates discussed:    Comments:  12/14/11 [facility] RN,BSN NCM [phone]

## 2011-12-14 NOTE — Op Note (Signed)
12/14/2011  2:30 PM  PATIENT:  Richard Warner  62 y.o. male  Patient Care Team: [name], MD as PCP - General (Family Medicine) [name], MD as Consulting Physician (Gastroenterology) [name], MD as Consulting Physician (Pulmonary Disease) [name], MD as Consulting Physician (Cardiology)  PRE-OPERATIVE DIAGNOSIS:  Sigmoid colon cancer   POST-OPERATIVE DIAGNOSIS:  Rectal cancer   PROCEDURE:  Procedure(s): LAPAROSCOPIC LOW ANTERIOR RESECTION PROCTOSCOPY  SURGEON:  Surgeon(s): [name], MD   ASSISTANT: Velora Heckler, MD   ANESTHESIA:   local and general  EBL:  Total I/O In: 3000 [I.V.:3000] Out: 75 [Urine:75]  Delay start of Pharmacological VTE agent (>24hrs) due to surgical blood loss or risk of bleeding:  no  DRAINS: (19) Blake drain(s) in the pelvis   SPECIMEN:  Source of Specimen:  rectosigmoid with anastomotic rings  DISPOSITION OF SPECIMEN:  PATHOLOGY  COUNTS:  YES  PLAN OF CARE: Admit to inpatient   PATIENT DISPOSITION:  PACU - hemodynamically stable.  INDICATION: Patient is a morbidly obese male. He had to be anticoagulated for pulmonary embolism. The was noted to have recurrent rectal bleeding. He an endoscopy and found to have a lesion 20 cm from his anal verge, implying sigmoid colon. This was tattooed. Biopsy showed cancer. He was sent to me for surgical evaluation.  I recommended surgery to resect that section of colon cancer:  The anatomy & physiology of the digestive tract was discussed.  The pathophysiology was discussed.  Natural history risks without surgery was discussed.   I feel the risks of no intervention will lead to serious problems that outweigh the operative risks; therefore, I recommended a partial colectomy to remove the pathology.  Laparoscopic & open techniques were discussed.   Risks such as bleeding, infection, abscess, leak, reoperation, possible ostomy, hernia, heart attack, death, and other  risks were discussed.  I noted a good likelihood this will help address the problem.   Goals of post-operative recovery were discussed as well.  We will work to minimize complications.  An educational handout on the pathology was given as well.  Questions were answered.  The patient expresses understanding & wishes to proceed with surgery.   OR FINDINGS: This is a rectal cancer. It was 10-13 cm from the anal verge. It was distal to the peritoneal reflection.  It was firm. There is no evidence of any metastatic disease.  DESCRIPTION:   Informed consent was confirmed.  The patient underwent general anaesthesia without difficulty.  The patient was positioned appropriately.  VTE prevention in place.  The patient's abdomen was clipped, prepped, & draped in a sterile fashion.  Surgical timeout confirmed our plan.  The patient was positioned in reverse Trendelenburg.  Abdominal entry was gained using optical entry technique in the right upper abdomen.  Entry was clean.  I induced carbon dioxide insufflation.  Camera inspection revealed no injury.  Extra ports were carefully placed under direct laparoscopic visualization.  He had some adhesions of greater omentum to his lower abdomen. I freed those off using a focused bipolar Enseal dissection. I reduced the greater omentum out of the pelvis. Removed the small bowel out as well. I cannot see any tattoo at all in the abdominal cavity.  I converted to rigid proctoscopy. I located a hard firm mass at 10 cm from anal verge that went up a few centimeters. I tattooed and anteriorly and laterally both sides.  I went and placed a GelPort through an  infraumbilical incision. He had a very thick infraumbilical, wall. Had to make a 9 cm incision. I could not feel a definite tumor in the sigmoid. Very thickened sigmoid mesentery given his morbid obesity.  I scored the peritoneum of the sigmoid colon from the peritoneal reflection proximately the ligament of Treitz.  Elevated up at the level of sacral promontory. I was able to elevate of the mesorectum and follow that off the presacral plane using a focused blunt and bipolar Enseal dissection.  I continued dissection and elevated the sigmoid mesentery anteriorly. I could feel the this IMA/IMV. I mobilized the colon in a lateral to medial fashion up to the splenic flexure. I freed the left colon off its retroperitoneal attachments of the kidney and posteriorly.  I eventually found the left ureter in a sea of retroperitoneal fat. We isolated it & saw vasa ureter. He did have some peristalsis. We kept it posterior.  We focused on the mesorectal dissection. We freed the lateral attachments from the sigmoid mesentery down to the peritoneal reflection (reflection. Her function actually was somewhat more distal to the bladder. I eventually did elevate the bladder anteriorly and scored the anterior peritoneal reflection using hook cautery. With that I can get into the plane between the rectum and the prostate. The careful blunt and focused dissection to help freed off. We did posterior dissection freed these rectum furthermore distally.  This helped to mobilize the rectosigmoid into the upper pelvis. We saw the right & left lateral stalks and ligated them using the Enseal system to keep the mesorectal pocket intact for a good total mesorectal excision. Dr. Gerrit Friends did feel a few left anterior attachments. I concurred. We eventually freed those off. With that was able to help him release the rectum.  I could feel a definite mass distal to the very low peritoneal reflection. The rectum distal to that was normal. I could see tattooing on the anterior rectum at this location consistent with the mass.  He did further dissection more distally. I can feel that we are 5 cm distal to the area of concern. We skeletonized and these rectum. I brought a contour stapler through the infraumbilical GelPort wound and brought around the rectum at  this point and transected at the mid to distal rectal junction. I had to do a second firing to get more mesorectum off. With that, we could eviscerate the rexctosigmoid and confirm that we had to several centimeters distal to the tattooed area and firm area of concern.  I returned the rectosigmoid back to the abdomen and isolated the inferior mesenteric artery and inferior mesenteric vein. We ligated those close for high ligation after skeletonizing. I used the Enseal system with repeated burnings and then ligated the stalk each with a 0 PDS Endoloop for good hemostasis. I allow the left colon to reach down quite well. He had some folding of the sigmoid colon which allowed some redundancy.  Eviscerated the left colon. The descending colon could reach into the pelvis quite well. I chose an area of descending/sigmoid junction. I transected the mesentery radially including inferior mesenteric artery and vein with the specimen. To the descending /sigmoid colon mesentery using the bipolar system. Transected at the descending/sigmoid junction. Mucosa was pink and had bleeding, showing good viability. I placed a 33 EEA anvil into the descending colon orifice and placed a 0 Prolene pursestring around it and tied it down. He had giant epiploic appendages that I freed off. I freed off some of the  colon mesentery off the handle little bit as well. We tried to avoid over sterilization. Return back into the abdomen.  I checked the specimen. The mass was several centimeters proximal to the transection line. I changed gloves.  Dr. Gerrit Friends scrubbed down.   He can feel no remaining Turner tumor in the rectal stump. Rigid proctoscopy confirmed this as well. He placed the EEA stapler up the rectal stump.  He brought the spike of the stapler out the rectal stump end under direct laparoscopic visualization. I placed the anvil  of the descending colon onto the spike of the stapler he tightened it down for 60 seconds. He fired. He  held it fired for 30 seconds. He released the stapler. He had 2 excellent anastomotic rings.   I did copious irrigation the pelvis. Release a lot of fat pockets and fragments and old blood. No active bleeding. He did a rigid proctoscopy  locating the anastomosis at 7 cm from the anal verge. He insufflated with myself clamping proximal  to the anastomosis. There is no leak of air under water. That was a negative air leak test.   Because mucosa was viable and it was  greater than 5 cm above the anal verge, we decided not to diversion proximally with a loop ileostomy.   I closed the fascia using #1 running PDS for the umbilical wound. Closed skin with interrupted Monocryl stitch. Placed iodoform gauze as wicks in the wound since his abdominal wall was about 6 inches thick. Starter supply. On-Q catheters have been placed under direct palpation and the sheaths were peeled away as catheters placed and after closure.   He's been extubated to go to the recovery room.   I am about to discuss the findings with the patient's family.

## 2011-12-14 NOTE — Anesthesia Procedure Notes (Signed)
Procedure Name: Intubation Date/Time: 12/14/2011 10:30 AM Performed by: [place], Nykira Reddix C Pre-anesthesia Checklist: Timeout performed, Patient identified, Emergency Drugs available, Suction available and Patient being monitored Patient Re-evaluated:Patient Re-evaluated prior to inductionOxygen Delivery Method: Circle system utilized Preoxygenation: Pre-oxygenation with 100% oxygen Intubation Type: IV induction and Inhalational induction Ventilation: Mask ventilation without difficulty and Oral airway inserted - appropriate to patient size Tube size: 7.5 mm Number of attempts: 2 Airway Equipment and Method: Video-laryngoscopy Placement Confirmation: ETT inserted through vocal cords under direct vision,  breath sounds checked- equal and bilateral,  positive ETCO2 and CO2 detector Secured at: 22 cm Tube secured with: Tape Dental Injury: Teeth and Oropharynx as per pre-operative assessment  Difficulty Due To: Difficulty was anticipated Comments: DL x 1 with MAC 4, epiglottis only seen.  DL with MAC 4 Glidescope blade, VC seen, 7.5 ETT passed under visualization via glidescope. Atraumatic.  Metal stylet used.

## 2011-12-15 LAB — BASIC METABOLIC PANEL
CO2: 28 mEq/L (ref 19–32)
Calcium: 8.6 mg/dL (ref 8.4–10.5)
Creatinine, Ser: 1.56 mg/dL — ABNORMAL HIGH (ref 0.50–1.35)

## 2011-12-15 LAB — CBC
MCV: 85.3 fL (ref 78.0–100.0)
Platelets: 138 10*3/uL — ABNORMAL LOW (ref 150–400)
RDW: 17 % — ABNORMAL HIGH (ref 11.5–15.5)
WBC: 11.4 10*3/uL — ABNORMAL HIGH (ref 4.0–10.5)

## 2011-12-15 LAB — GLUCOSE, CAPILLARY: Glucose-Capillary: 129 mg/dL — ABNORMAL HIGH (ref 70–99)

## 2011-12-15 NOTE — Progress Notes (Signed)
Seen by Dr. Johna Sheriff this afternoon. Patient able to ambulate in the hallway about 18ft, complained of pain 7/10 after ambulation.

## 2011-12-15 NOTE — Progress Notes (Signed)
Patient ID: JEFFRE ENRIQUES, male   DOB: [birth year], 61 y.o.   MRN: [id number] 1 Day Post-Op  Subjective: No complaints, just sore and mild pain relieved with meds. Tol liquid diet without nausea  Objective: Vital signs in last 24 hours: Temp:  [97.2 F (36.2 C)-98.8 F (37.1 C)] 98.3 F (36.8 C) (05/11 1027) Pulse Rate:  [54-82] 82  (05/11 1027) Resp:  [16-22] 22  (05/11 1027) BP: (102-166)/(48-89) 139/87 mmHg (05/11 1027) SpO2:  [94 %-100 %] 96 % (05/11 1027) Weight:  [357 lb 0.9 oz (161.96 kg)] 357 lb 0.9 oz (161.96 kg) (05/10 1646)    Intake/Output from previous day: 05/10 0701 - 05/11 0700 In: 4250 [I.V.:4250] Out: 1074 [Urine:705; Drains:219; Blood:150] Intake/Output this shift: Total I/O In: 150 [P.O.:150] Out: 318 [Urine:300; Drains:18]  General appearance: alert, cooperative, no distress and morbidly obese Resp: clear to auscultation bilaterally and no increased work of breathing GI: normal findings: soft, non-tender and obese Incision/Wound: Some bloody drainage on bandage but intact  Lab Results:   Basename 12/15/11 0503 12/14/11 1736  WBC 11.4* 15.4*  HGB 14.4 15.3  HCT 41.8 43.6  PLT 138* 156   BMET  Basename 12/15/11 0503 12/14/11 1736  NA 137 --  K 4.6 --  CL 101 --  CO2 28 --  GLUCOSE 154* --  BUN 15 --  CREATININE 1.56* 1.57*  CALCIUM 8.6 --     Studies/Results: No results found.  Anti-infectives: Anti-infectives     Start     Dose/Rate Route Frequency Ordered Stop   12/14/11 0816   ertapenem (INVANZ) 1 g in sodium chloride 0.9 % 50 mL IVPB        1 g 100 mL/hr over 30 Minutes Intravenous 60 min pre-op 12/14/11 0816 12/14/11 1037          Assessment/Plan: s/p Procedure(s): LAPAROSCOPIC LOW ANTERIOR RESECTION PROCTOSCOPY Stable post op Cont pulmonary tiolet, OOB today   LOS: 1 day    Xabi Wittler T 12/15/2011

## 2011-12-16 LAB — CBC
Hemoglobin: 13.3 g/dL (ref 13.0–17.0)
MCHC: 33.5 g/dL (ref 30.0–36.0)
Platelets: 136 10*3/uL — ABNORMAL LOW (ref 150–400)
RDW: 17 % — ABNORMAL HIGH (ref 11.5–15.5)

## 2011-12-16 NOTE — Progress Notes (Signed)
Attempted to ambulate, unable due to nausea x 2.

## 2011-12-16 NOTE — Progress Notes (Signed)
Patient ID: Richard Warner, male   DOB: 01/07/1951, 61 y.o.   MRN: [id number] 2 Days Post-Op  Subjective: No complaints this morning. Just sore. He walked in the halls yesterday. Taking full liquids but no bowel movements or flatus yet. No nausea or vomiting.  Objective: Vital signs in last 24 hours: Temp:  [97.7 F (36.5 C)-98.6 F (37 C)] 98.2 F (36.8 C) (05/12 0542) Pulse Rate:  [73-91] 91  (05/12 0542) Resp:  [16-22] 16  (05/12 0542) BP: (129-148)/(76-87) 137/76 mmHg (05/12 0542) SpO2:  [94 %-98 %] 94 % (05/12 0542)    Intake/Output from previous day: 05/11 0701 - 05/12 0700 In: 300 [P.O.:300] Out: 2093 [Urine:2050; Drains:43] Intake/Output this shift:    General appearance: alert, no distress and morbidly obese GI: normal findings: soft, non-tender Incision/Wound: dressings intact with some old bloody drainage.  Lab Results:   Basename 12/16/11 0526 12/15/11 0503  WBC 8.0 11.4*  HGB 13.3 14.4  HCT 39.7 41.8  PLT 136* 138*   BMET  Basename 12/15/11 0503 12/14/11 1736  NA 137 --  K 4.6 --  CL 101 --  CO2 28 --  GLUCOSE 154* --  BUN 15 --  CREATININE 1.56* 1.57*  CALCIUM 8.6 --     Studies/Results: No results found.  Anti-infectives: Anti-infectives     Start     Dose/Rate Route Frequency Ordered Stop   12/14/11 0816   ertapenem (INVANZ) 1 g in sodium chloride 0.9 % 50 mL IVPB        1 g 100 mL/hr over 30 Minutes Intravenous 60 min pre-op 12/14/11 0816 12/14/11 1037          Assessment/Plan: s/p Procedure(s): LAPAROSCOPIC LOW ANTERIOR RESECTION PROCTOSCOPY Doing well postoperatively without apparent complication. Will try to increase activity today. Continue full liquids as no bowel movement or flatus yet.   LOS: 2 days    Ceanna Wareing T 12/16/2011  fg

## 2011-12-17 MED ORDER — SIMVASTATIN 40 MG PO TABS
40.0000 mg | ORAL_TABLET | Freq: Every evening | ORAL | Status: DC
  Administered 2011-12-17 – 2011-12-18 (×2): 40 mg via ORAL
  Filled 2011-12-17 (×3): qty 1

## 2011-12-17 MED ORDER — HYDROMORPHONE HCL PF 1 MG/ML IJ SOLN
1.0000 mg | INTRAMUSCULAR | Status: DC | PRN
  Administered 2011-12-17: 2 mg via INTRAVENOUS
  Administered 2011-12-18: 1 mg via INTRAVENOUS
  Filled 2011-12-17: qty 1
  Filled 2011-12-17: qty 2

## 2011-12-17 MED ORDER — METOPROLOL TARTRATE 25 MG PO TABS
25.0000 mg | ORAL_TABLET | Freq: Two times a day (BID) | ORAL | Status: DC
  Administered 2011-12-17 (×2): 25 mg via ORAL
  Filled 2011-12-17 (×4): qty 1

## 2011-12-17 NOTE — Progress Notes (Signed)
Pt transferred to 1527, report called by RN on prior shift. Pt remained on unit to finish eating dinner before transfer. Richard Warner. Clelia Croft, RN

## 2011-12-17 NOTE — Progress Notes (Signed)
Richard Warner [id number] 1951-02-11  CARE TEAM:  PCP: [name], MD, MD  Outpatient Care Team: Patient Care Team: [name], MD as PCP - General (Family Medicine) [name], MD as Consulting Physician (Gastroenterology) [name], MD as Consulting Physician (Pulmonary Disease) [name], MD as Consulting Physician (Cardiology)  Inpatient Treatment Team: Treatment Team: Attending Provider: [name], MD; Registered Nurse: Merla Riches Alpas Sison, RN; Technician: Cindie Crumbly, NT; Technician: Laurance Flatten Major, NT; Technician: Dorene Grebe, NT; Registered Nurse: Natasha Mead, RN  Subjective:  Sore but feeling better Walking more  Objective:  Vital signs:  Filed Vitals:   12/16/11 0542 12/16/11 1338 12/16/11 2217 12/17/11 0510  BP: 137/76 120/73 150/76 152/83  Pulse: 91 81 100 72  Temp: 98.2 F (36.8 C) 98 F (36.7 C) 98.8 F (37.1 C) 98.3 F (36.8 C)  TempSrc: Oral Oral Oral Oral  Resp: 16 18 16 16   Height:      Weight:      SpO2: 94% 95% 95% 96%       Intake/Output   Yesterday:  05/12 0701 - 05/13 0700 In: 2341.7 [I.V.:2341.7] Out: 2301 [Urine:2275; Drains:26] This shift:     Bowel function:  Flatus: y  BM: n  Drain serosanguinous  Physical Exam:  General: Pt awake/alert/oriented x4 in no acute distress Eyes: PERRL, normal EOM.  Sclera clear.  No icterus Neuro: CN II-XII intact w/o focal sensory/motor deficits. Lymph: No head/neck/groin lymphadenopathy Psych:  No delerium/psychosis/paranoia HENT: Normocephalic, Mucus membranes moist.  No thrush Neck: Supple, No tracheal deviation Chest: Clear but dec in bases.   No chest wall pain w good excursion CV:  Pulses intact.  Regular rhythm Abdomen: Soft.  Nondistended.  Mildly tender at incisions only.  No incarcerated hernias.  Morbidly obese Ext:  SCDs BLE.  No mjr edema.  No cyanosis Skin: No petechiae / purpurae  Results:    Labs: Results for orders placed during the hospital encounter of 12/14/11 (from the past 48 hour(s))  GLUCOSE, CAPILLARY     Status: Abnormal   Collection Time   12/15/11 10:20 PM      Component Value Range Comment   Glucose-Capillary 129 (*) 70 - 99 (mg/dL)   CBC     Status: Abnormal   Collection Time   12/16/11  5:26 AM      Component Value Range Comment   WBC 8.0  4.0 - 10.5 (K/uL)    RBC 4.63  4.22 - 5.81 (MIL/uL)    Hemoglobin 13.3  13.0 - 17.0 (g/dL)    HCT 40.9  81.1 - 91.4 (%)    MCV 85.7  78.0 - 100.0 (fL)    MCH 28.7  26.0 - 34.0 (pg)    MCHC 33.5  30.0 - 36.0 (g/dL)    RDW 78.2 (*) 95.6 - 15.5 (%)    Platelets 136 (*) 150 - 400 (K/uL)     Imaging / Studies: No results found.  Medications / Allergies: per chart  Antibiotics: Anti-infectives     Start     Dose/Rate Route Frequency Ordered Stop   12/14/11 0816   ertapenem (INVANZ) 1 g in sodium chloride 0.9 % 50 mL IVPB        1 g 100 mL/hr over 30 Minutes Intravenous 60 min pre-op 12/14/11 0816 12/14/11 1037          Problem List:  Active Problems:  Malignant neoplasm of sigmoid (flexure)   Assessment  Richard Warner  61 y.o. male  3 Days Post-Op  Procedure(s): LAPAROSCOPIC LOW ANTERIOR RESECTION PROCTOSCOPY  Improving   Plan: -d/c tele -wean off oxygen -inc BP control -adv diet -stop IVF -f/u path -control hypothyroidism -VTE prophylaxis- SCDs, he[parinetc -mobilize as tolerated to help recovery  [name], M.D., F.A.C.S. Gastrointestinal and Minimally Invasive Surgery [facility] [address], [address] [place], [place] [address] [phone] Main / Paging [phone] Voice Mail   12/17/2011

## 2011-12-17 NOTE — Progress Notes (Signed)
Dressing changed on surgical site  mid lower abdomen and JP site, incision site pink, patient tolerated well .

## 2011-12-17 NOTE — Progress Notes (Signed)
Patient ambulated this evening down the hall, back to the nurse's station, then back to his room. Tolerated activity very well.

## 2011-12-17 NOTE — Progress Notes (Signed)
Ambulate in the hallway tolerated well, no complaints of pain or shortness of breath during and after ambulation.

## 2011-12-18 MED ORDER — METOPROLOL TARTRATE 50 MG PO TABS
50.0000 mg | ORAL_TABLET | Freq: Two times a day (BID) | ORAL | Status: DC
  Administered 2011-12-18 – 2011-12-19 (×3): 50 mg via ORAL
  Filled 2011-12-18 (×4): qty 1

## 2011-12-18 MED ORDER — OXYCODONE HCL 5 MG PO TABS: 5.0000 mg | ORAL_TABLET | ORAL | Status: DC | PRN

## 2011-12-18 NOTE — Progress Notes (Signed)
Richard Warner [id number] 06-21-1951  CARE TEAM:  PCP: [name], MD, MD  Outpatient Care Team: Patient Care Team: [name], MD as PCP - General (Family Medicine) [name], MD as Consulting Physician (Gastroenterology) [name], MD as Consulting Physician (Pulmonary Disease) [name], MD as Consulting Physician (Cardiology)  Inpatient Treatment Team: Treatment Team: Attending Provider: [name], MD; Technician: Cindie Crumbly, NT; Technician: Laurance Flatten Major, NT; Technician: Dorene Grebe, NT; Registered Nurse: Natasha Mead, RN; Registered Nurse: Dina Rich, RN; Technician: Harrie Jeans Debbink, NT  Subjective: Transferred off telemetry Sore but feeling better Walking more Trying solids PO  Objective:  Vital signs:  Filed Vitals:   12/17/11 0510 12/17/11 1300 12/17/11 2034 12/18/11 0612  BP: 152/83 128/71 157/80 146/75  Pulse: 72 70 84 77  Temp: 98.3 F (36.8 C) 98.1 F (36.7 C) 98.3 F (36.8 C) 98.8 F (37.1 C)  TempSrc: Oral  Oral Oral  Resp: 16 15 18 18   Height:      Weight:      SpO2: 96% 97% 95% 97%    Last BM Date: 12/17/11  Intake/Output   Yesterday:  05/13 0701 - 05/14 0700 In: 480 [P.O.:480] Out: 73 [Drains:73] This shift:  Total I/O In: -  Out: 475 [Urine:450; Drains:25]  Bowel function:  Flatus: y  BM: y  Drain sanguinous  Physical Exam:  General: Pt awake/alert/oriented x4 in no acute distress Eyes: PERRL, normal EOM.  Sclera clear.  No icterus Neuro: CN II-XII intact w/o focal sensory/motor deficits. Lymph: No head/neck/groin lymphadenopathy Psych:  No delerium/psychosis/paranoia HENT: Normocephalic, Mucus membranes moist.  No thrush Neck: Supple, No tracheal deviation Chest: Clear but dec in bases.   No chest wall pain w good excursion CV:  Pulses intact.  Regular rhythm Abdomen: Soft.  Morbidly obese.  Nondistended.  Mildly tender at incisions only.  No  incarcerated hernias.  Morbidly obese Ext:  SCDs BLE.  No mjr edema.  No cyanosis Skin: No petechiae / purpurae  Results:   Labs: No results found for this or any previous visit (from the past 48 hour(s)).  Imaging / Studies: No results found.  Medications / Allergies: per chart  Antibiotics: Anti-infectives     Start     Dose/Rate Route Frequency Ordered Stop   12/14/11 0816   ertapenem (INVANZ) 1 g in sodium chloride 0.9 % 50 mL IVPB        1 g 100 mL/hr over 30 Minutes Intravenous 60 min pre-op 12/14/11 0816 12/14/11 1037          Problem List:  Active Problems:  Malignant neoplasm of sigmoid (flexure)  PATHOLOGY:  FINAL DIAGNOSIS Diagnosis Colon, segmental resection for tumor, Sigmoid - INVASIVE ADENOCARCINOMA, INVADING THROUGH THE MUSCULARIS PROPRIA INTO PERICOLONIC FATTY TISSUE. - NO EVIDENCE OF ANGIOLYMPHATIC INVASION IDENTIFIED. - TWELVE PERICOLONIC LYMPH NODES, NEGATIVE FOR METASTATIC CARCINOMA (0/12). - RESECTION MARGINS ARE NEGATIVE FOR ATYPIA OR MALIGNANCY. Microscopic Comment COLON AND RECTUM Specimen: Sigmoid colon Procedure: Segmental resection Tumor site: Sigmoid colon Specimen integrity: Intact Macroscopic intactness of mesorectum: N/A Macroscopic tumor perforation: No Invasive tumor: Maximum size: 4.0 cm, Richard Warner measurement Histologic type(s): Invasive colonic adenocarcinoma Histologic grade and differentiation: G2: moderately differentiated/low grade Type of polyp in which invasive carcinoma arose: N/A Microscopic extension of invasive tumor: Tumor invades through the muscularis propria into pericolonic fatty tissue. Lymph-Vascular invasion: Not identified Peri-neural invasion: Not identified Tumor deposit(s) (discontinuous extramural extension): N/A Resection margins: Negative Proximal  margin: >15 cm Distal margin: >2 c m Mesenteric margin (sigmoid and transverse) 4 cm Treatment effect (neoadjuvant therapy): No Number of lymph nodes  examined - 12; Number positive 0 1 of 3 FINAL for Richard Warner, Richard Warner [phone]) Microscopic Comment(continued) Additional polyp(s): N/A Non-neoplastic findings: No Pathologic Staging: pT3, pN0, pMX  Assessment  Richard Warner  61 y.o. male  4 Days Post-Op  Procedure(s): LAPAROSCOPIC LOW ANTERIOR RESECTION PROCTOSCOPY  Improving   Plan:  -inc BP control - return to 100mg /day metoprolol -adv diet -stop IVF -path T3N0 - to see med onc for f/u with me -hypothyroidism stable -VTE prophylaxis- SCDs, heparin, etc -mobilize as tolerated to help recovery  [name], M.D., F.A.C.S. Gastrointestinal and Minimally Invasive Surgery [facility] [address] [place], [place] [address] [phone] Main / Paging [phone] Voice Mail   12/18/2011

## 2011-12-18 NOTE — Progress Notes (Signed)
UR complete 

## 2011-12-19 ENCOUNTER — Encounter (HOSPITAL_COMMUNITY): Payer: Self-pay | Admitting: Surgery

## 2011-12-19 DIAGNOSIS — C20 Malignant neoplasm of rectum: Secondary | ICD-10-CM | POA: Diagnosis present

## 2011-12-19 MED ORDER — OXYCODONE HCL 5 MG PO TABS: 5.0000 mg | ORAL_TABLET | ORAL | Status: AC | PRN

## 2011-12-19 NOTE — Discharge Summary (Signed)
Physician Discharge Summary  Patient ID: Richard Warner MRN: [id number] DOB/AGE: 12/20/1950 61 y.o.  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 Initially this was felt to be a sigmoid colon cancer. It was a rectal cancer. He was placed on anti-ileus protocol. He was gradually advanced on his diet. He mobilized. He was placed on DVT prophylaxis.  By the time of discharge, he was walking well the hallways, eating a solid diet, had had flatus and bowel movements. His pain was adequately controlled. Based on improvements we thought it be safe for him to be discharged home with close followup. Instructions were discussed numerous times. They are written. The patient and nurse agreed with this plan  Consults: None  Significant Diagnostic Studies:   No results found.  Treatments: surgery: Lap LAR  Discharge Exam: Blood pressure 147/75, pulse 78, temperature 98.7 F (37.1 C), temperature source Oral, resp. rate 18, height 5\' 11"  (1.803 m), weight 357 lb 0.9 oz (161.96 kg), SpO2 98.00%.  General: Pt awake/alert/oriented x4 in no acute distress  Eyes: PERRL, normal EOM. Sclera clear. No icterus  Neuro: CN II-XII intact w/o focal sensory/motor deficits.  Lymph: No head/neck/groin lymphadenopathy  Psych: No delerium/psychosis/paranoia  HENT: Normocephalic, Mucus  membranes moist. No thrush  Neck: Supple, No tracheal deviation  Chest: Clear but dec in bases. No chest wall pain w good excursion  CV: Pulses intact. Regular rhythm  Abdomen: Soft. Morbidly obese. Nondistended. Mildly tender at incisions only. No incarcerated hernias. Mild serosanguinous drainage at infraumbilical incision Ext: SCDs BLE. No mjr edema. No cyanosis  Skin: No petechiae / purpurae   Disposition: 06-Home-Health Care Svc  Discharge Orders    Future Appointments: Provider: Department: Dept Phone: Center:   01/01/2012 1:30 PM [facility] [phone] None   01/01/2012 2:00 PM [name], MD [facility] [phone] None     Future Orders Please Complete By Expires   Diet - low sodium heart healthy      Increase activity slowly      Change dressing (specify)      Comments:   Dressing change: 1-2 times per day using triple antibiotic ointment & clean 4x4 gauze.  When the On-Q pain ball in the black fanny pack has fully collapsed to the size of a lipstick cylinder (probably Friday/Saturday) , remove of the clear band-aids & pull the thin brown plastic wires out of the skin.     Medication List  As of 12/19/2011  6:54 AM   TAKE these medications         aspirin EC 81 MG tablet   Take 81 mg by mouth daily.      atenolol 100 MG tablet   Commonly known as: TENORMIN   Take 100 mg by mouth every evening.      doxazosin 2 MG tablet   Commonly known as: CARDURA   Take 2 mg by mouth at bedtime.      Ferrous Sulfate Dried 200 (  65 FE) MG Tabs   Take 1 tab twice daily      levothyroxine 25 MCG tablet   Commonly known as: SYNTHROID, LEVOTHROID   Take 25 mcg by mouth daily with breakfast.      mulitivitamin with minerals Tabs   Take 1 tablet by mouth daily.      oxyCODONE 5 MG immediate release tablet   Commonly known as: Oxy IR/ROXICODONE   Take 1-2 tablets (5-10 mg total) by mouth every 4 (four) hours as needed.      simvastatin  40 MG tablet   Commonly known as: ZOCOR   Take 40 mg by mouth every evening.           Follow-up Information    Follow up with Celenia Hruska C., MD in 1 week.   Contact information:   [facility] [address] [place] [address] [phone]          Signed: [name]. 12/19/2011, 6:54 AM

## 2011-12-19 NOTE — Progress Notes (Signed)
No changes noted since shift change at 1100.  Patient is ready to be DC to home.  IV DC.  Patient and family carefully, and thoroughly shown how to change abdominal dressings, how to take On-Q pump out when pump is empty, and how to empty the JP.  A JP record sheet was started and patient instructed to empty when no more than 1/2 full and to record drainage amount.  Measuring cups, 4X4s, antibiotic cream, and tape sent home with patient.  Home med rec carefully reviewed with patient.  Patient denies further questions or concerns at this time.  Family to DC patient to home.

## 2011-12-21 ENCOUNTER — Telehealth (INDEPENDENT_AMBULATORY_CARE_PROVIDER_SITE_OTHER): Payer: Self-pay | Admitting: Surgery

## 2011-12-26 ENCOUNTER — Encounter (INDEPENDENT_AMBULATORY_CARE_PROVIDER_SITE_OTHER): Payer: Self-pay | Admitting: Surgery

## 2011-12-26 ENCOUNTER — Ambulatory Visit (INDEPENDENT_AMBULATORY_CARE_PROVIDER_SITE_OTHER): Payer: Self-pay | Admitting: Surgery

## 2011-12-26 ENCOUNTER — Telehealth (INDEPENDENT_AMBULATORY_CARE_PROVIDER_SITE_OTHER): Payer: Self-pay

## 2011-12-26 VITALS — BP 134/70 | HR 74 | Temp 97.6°F | Resp 16 | Ht 72.0 in | Wt 344.1 lb

## 2011-12-26 DIAGNOSIS — C20 Malignant neoplasm of rectum: Secondary | ICD-10-CM

## 2011-12-26 NOTE — Telephone Encounter (Signed)
Pt calling to ask about restarting his blood thinners back that Dr Shona Simpson take care of for him. The pt forgot to ask Dr Michaell Cowing at his appt today. Please advise

## 2011-12-26 NOTE — Patient Instructions (Signed)

## 2011-12-27 ENCOUNTER — Encounter (INDEPENDENT_AMBULATORY_CARE_PROVIDER_SITE_OTHER): Payer: Self-pay | Admitting: Surgery

## 2011-12-27 ENCOUNTER — Telehealth (INDEPENDENT_AMBULATORY_CARE_PROVIDER_SITE_OTHER): Payer: Self-pay

## 2011-12-27 NOTE — Progress Notes (Signed)
Subjective:     Patient ID: Richard Warner, male   DOB: 11-15-1950, 61 y.o.   MRN: [id number]  HPI  [name]  10/01/1950 [id number]  Patient Care Team: [name], MD as PCP - General (Family Medicine) [name], MD as Consulting Physician (Gastroenterology) [name], MD as Consulting Physician (Pulmonary Disease) [name], MD as Consulting Physician (Cardiology) [name], MD as Consulting Physician (Medical Oncology)  This patient is a 61 y.o.male who presents today for surgical evaluation.   Procedure: Diagnostic laparoscopy. Rigid proctoscopy and tattooing. Low anterior resection of mid/proximal rectal cancer. 12/14/2011  PATHOLOGY:  FINAL DIAGNOSIS Diagnosis Colon, segmental resection for tumor, - INVASIVE ADENOCARCINOMA, INVADING THROUGH THE MUSCULARIS PROPRIA INTO PERICOLONIC FATTY TISSUE. - NO EVIDENCE OF ANGIOLYMPHATIC INVASION IDENTIFIED. - TWELVE PERICOLONIC LYMPH NODES, NEGATIVE FOR METASTATIC CARCINOMA (0/12). - RESECTION MARGINS ARE NEGATIVE FOR ATYPIA OR MALIGNANCY.  Reason for visit: Postoperative followup.  The patient comes in today feeling fine. Has not taken any narcotics since he was discharged home. Just an occasional over-the-counter pain medicine  Having a bowel movement every day. Denies any fevers or chills. Energy level good. Wanting to get back to driving. Appetite coming back. Drainage output has tapered off to less than 10 mL a day. He comes today with his mother. He has not started his anticoagulation back up   Patient Active Problem List  Diagnoses  . HYPOTHYROIDISM  . DYSLIPIDEMIA  . Morbid obesity  . HYPERTENSION  . PULMONARY EMBOLISM AND INFARCTION  . RENAL DISEASE, CHRONIC, STAGE III  . DVT (deep venous thrombosis)  . Iron deficiency anemia, unspecified  . Benign neoplasm of colon  . Clotting disorder  . 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 Hypertension   . Pulmonary embolism 12-03-11    01-17-10 s/p DVT left leg -Coumadin tx.until 08-28-11  . DVT (deep vein thrombosis) in pregnancy 12-03-11    6'2011-left leg  . 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 Hyperlipidemia   . Leg swelling 12-03-11    bilateral if up most of day,equally swells, up to knee level  . Shortness of breath 12-03-11    hx. 6'11- during pulmonary emboli episode, none now, extreme exertion only.  . Anemia 12-03-11    tx. oral iron supplement  . Chronic kidney disease 12-03-11    kidney stone x1 ,none recent  . Arthritis 12-03-11    mild lower back  . Fractures 12-03-11    s/p right ankle and right wrist- no problems now    Past Surgical History  Procedure Date  . Colonoscopy 10/08/2011    Procedure: COLONOSCOPY;  Surgeon: [name], MD;  Location: [facility];  Service: Endoscopy;  Laterality: N/A;  . Tonsillectomy 12-03-11    child  . Proctoscopy 12/14/2011    Procedure: PROCTOSCOPY;  Surgeon: [name], MD;  Location: [facility] ORS;  Service: General;  Laterality: N/A;  . Low anterior bowel resection 12/14/2011    Mid/proximal rectal cancer. T3 N0    History   Social History  . Marital Status: Single    Spouse Name: N/A    Number of Children: N/A  . Years of Education: N/A   Occupational History  . MERCHANT     SELF EMPLOYED   Social History Main Topics  . Smoking status: Never Smoker   . Smokeless tobacco: Never Used  . Alcohol Use: No  .  Drug Use: No  . Sexually Active: Not on file   Other Topics Concern  . Not on file   Social History Narrative  . No narrative on file    Family History  Problem Relation Age of Onset  . Diabetes Father   . Heart disease Father   . Stroke Maternal Grandmother   . Stroke Maternal Grandfather   . Hypertension Paternal Grandfather     Current Outpatient Prescriptions  Medication Sig Dispense Refill  . aspirin EC 81 MG tablet Take 81 mg by mouth daily.      [name] atenolol  (TENORMIN) 100 MG tablet Take 100 mg by mouth every evening.       [name] doxazosin (CARDURA) 2 MG tablet Take 2 mg by mouth at bedtime.      . Ferrous Sulfate Dried 200 (65 FE) MG TABS Take 1 tab twice daily      . levothyroxine (SYNTHROID, LEVOTHROID) 25 MCG tablet Take 25 mcg by mouth daily with breakfast.       . Multiple Vitamin (MULITIVITAMIN WITH MINERALS) TABS Take 1 tablet by mouth daily.      [name] oxyCODONE (OXY IR/ROXICODONE) 5 MG immediate release tablet Take 1-2 tablets (5-10 mg total) by mouth every 4 (four) hours as needed.  40 tablet  0  . simvastatin (ZOCOR) 40 MG tablet Take 40 mg by mouth every evening.         No Known Allergies  BP 134/70  Pulse 74  Temp(Src) 97.6 F (36.4 C) (Temporal)  Resp 16  Ht 6' (1.829 m)  Wt 344 lb 2 oz (156.094 kg)  BMI 46.67 kg/m2  No results found.   Review of Systems  Constitutional: Negative for fever, chills and diaphoresis.  HENT: Negative for sore throat, trouble swallowing and neck pain.   Eyes: Negative for photophobia and visual disturbance.  Respiratory: Negative for choking and shortness of breath.   Cardiovascular: Negative for chest pain and palpitations.  Gastrointestinal: Negative for nausea, vomiting, abdominal distention, anal bleeding and rectal pain.  Genitourinary: Negative for dysuria, urgency, difficulty urinating and testicular pain.  Musculoskeletal: Negative for myalgias, arthralgias and gait problem.  Skin: Negative for color change and rash.  Neurological: Negative for dizziness, speech difficulty, weakness and numbness.  Hematological: Negative for adenopathy.  Psychiatric/Behavioral: Negative for hallucinations, confusion and agitation.       Objective:   Physical Exam  Constitutional: He is oriented to person, place, and time. He appears well-developed and well-nourished. No distress.  HENT:  Head: Normocephalic.  Mouth/Throat: Oropharynx is clear and moist. No oropharyngeal exudate.  Eyes: Conjunctivae  and EOM are normal. Pupils are equal, round, and reactive to light. No scleral icterus.  Neck: Normal range of motion. No tracheal deviation present.  Cardiovascular: Normal rate, normal heart sounds and intact distal pulses.   Pulmonary/Chest: Effort normal. No respiratory distress.  Abdominal: Soft. He exhibits no distension. There is no tenderness. Hernia confirmed negative in the right inguinal area and confirmed negative in the left inguinal area.       Incisions clean with normal healing ridges.  Less than 1 cm erythema at the inferior base. No fluctuance. No hernias.  Drain in the lower abdomen serous. I removed. He tolerated well.  Musculoskeletal: Normal range of motion. He exhibits no tenderness.  Neurological: He is alert and oriented to person, place, and time. No cranial nerve deficit. He exhibits normal muscle tone. Coordination normal.  Skin: Skin is warm and dry. No rash noted.  He is not diaphoretic.  Psychiatric: He has a normal mood and affect. His behavior is normal.       Assessment:     Postop day #11 status post low laparoscopic low anterior resection recovering remarkably well in a morbidly obese male    Plan:     We discussed his case at the GI tumor board earlier this morning. Given the fact that this is rectal in T3, he may benefit from post adjuvant therapy. He is due to see medical oncology. I will double check that he is getting a radiation oncology appointment as well. I explained the patient that is a good idea to get information just to help minimize local recurrence and see if there are any trials to help prevent recurrence. The fact that he is node-negative is a hopeful sign that he'll have a good chance of survival with this.  Increase activity as tolerated.  Do not push through pain.  Okay to get back to driving. Gradually increase the length. I will not have him driving up to [place] just yet.  Advanced on diet as tolerated. Bowel regimen to avoid  problems.  Return to clinic 3 weeks to make sure he continues to improve. The patient expressed understanding and appreciation.  Surgical follow-up after low rectal cancer resection: -Rectal exam Q3 months 1st year after surgery -Colonoscopy at end of 1st year (& probable 4th year) -Rectal exam Q44months 2nd year -Rectal exam annually 3rd-5th years  Addendum: He called back later noting he forgot to mention he's not been back on his warfarin anticoagulation. I encouraged him to get back on that. I thought I had instructed him to restart that when he discharged home on postop day #4...  He is transitioning to a more local primary care physician.

## 2011-12-27 NOTE — Telephone Encounter (Signed)
LMOM stating that the pt could go ahead and restart bloodthinners.

## 2012-01-01 ENCOUNTER — Ambulatory Visit: Payer: Self-pay

## 2012-01-01 ENCOUNTER — Encounter: Payer: Self-pay | Admitting: Oncology

## 2012-01-01 ENCOUNTER — Encounter: Payer: Self-pay | Admitting: Radiation Oncology

## 2012-01-01 ENCOUNTER — Ambulatory Visit (HOSPITAL_BASED_OUTPATIENT_CLINIC_OR_DEPARTMENT_OTHER): Payer: Medicaid Other | Admitting: Oncology

## 2012-01-01 ENCOUNTER — Telehealth: Payer: Self-pay | Admitting: *Deleted

## 2012-01-01 ENCOUNTER — Encounter: Payer: Self-pay | Admitting: *Deleted

## 2012-01-01 VITALS — BP 130/63 | HR 60 | Temp 97.1°F | Ht 72.0 in | Wt 346.2 lb

## 2012-01-01 DIAGNOSIS — Z7901 Long term (current) use of anticoagulants: Secondary | ICD-10-CM

## 2012-01-01 DIAGNOSIS — Z5181 Encounter for therapeutic drug level monitoring: Secondary | ICD-10-CM

## 2012-01-01 DIAGNOSIS — C189 Malignant neoplasm of colon, unspecified: Secondary | ICD-10-CM

## 2012-01-01 DIAGNOSIS — C20 Malignant neoplasm of rectum: Secondary | ICD-10-CM

## 2012-01-01 DIAGNOSIS — I2699 Other pulmonary embolism without acute cor pulmonale: Secondary | ICD-10-CM

## 2012-01-01 NOTE — Progress Notes (Signed)
New patient today, patient came in alone,patient has no insurance, gave patient an EPP financial application, patient is already receiving disability and patient states he has already filled out application for medicaid. Patient had concerns about medicaid and whether or not if he should have been offered Medicare I advised the patient to call his disability worker to find out.

## 2012-01-01 NOTE — Telephone Encounter (Signed)
Phone call to patient to confirm he is aware of today's appointment with Dr. Truett Perna.  He verbalized understanding.  This RN introduced self and gave contact phone number. Phone call to [facility] and spoke with Elease Hashimoto re: Radiation Oncology referral.  This RN will contact radiation oncology and get appointment for the pt.

## 2012-01-01 NOTE — Progress Notes (Signed)
Met with patient and explained navigator role.  Information given to patient on colorectal cancer.  Resource phone numbers provided.  SW referral to assist with needs as patient does not have insurance.  Information given to [facility] to apply for drug assistance (Xeloda).  Patient instructed to bring proof of income to [facility] in order for her to complete the application in time for treatment start date of June 10th.  Referral to dietician.

## 2012-01-01 NOTE — Progress Notes (Signed)
[facility] New Patient Consult   Referring MD: Stclair Szymborski 61 y.o.  11-26-1950    Reason for Referral: Rectal cancer     HPI: He reports episodes of rectal bleeding earlier this year. He was taking Coumadin for a DVT/pulmonary embolism diagnosed in 2011.  He was referred to Dr. Arlyce Dice and underwent a colonoscopy on 10/08/2011. A mass was found in the sigmoid colon at 20 cm from the anus. 2 polyps were found in the a sending colon, pulse were seen in the descending colon and removed with cold polypectomy. A diminutive polyp was found in the proximal transverse colon, removed with biopsy forceps. The pathology revealed a tubular adenoma and a serrated adenoma at the a sending and proximal transverse colon. A 2 per adenoma was noted at the descending colon. The biopsy from the sigmoid colon returned as adenocarcinoma.  A CT of the chest showed no evidence of metastatic disease on 10/11/2011. A CT of the abdomen on 08/28/2011 revealed no focal liver abnormality. No pelvic or inguinal adenopathy. The colon appeared normal.  He was referred to Dr.Gross and was taken the operating room on 12/14/2011. A laparoscopic assisted low anterior resection was performed. A rectal cancer was identified at 10-13 cm from the anal verge. The tumor was distal to the peritoneal reflection. No evidence of metastatic disease. A Mesorectal excision was performed.  The pathology confirmed an invasive adenocarcinoma (pT3,pN0) with 12 negative lymph nodes. The resection margins were negative. Lymphovascular and perineural invasion were not identified.  The low abdominal wound continues to heal. He feels well. He was taken off of Coumadin anticoagulation for surgery. The Coumadin has been restarted.  Past Medical History  Diagnosis Date  . Hypertension   . Pulmonary embolism     01-17-10 s/p DVT left leg -Coumadin tx.until 08-28-11  . DVT (deep vein thrombosis)    6'2011-left leg  .    [name] Hyperlipidemia   . Leg swelling     bilateral if up most of day,equally swells, up to knee level  . Shortness of breath     hx. 6'11- during pulmonary emboli episode, none now, extreme exertion only.  . Anemia-January 2013      tx. oral iron supplement  . Chronic kidney disease     kidney stone x1 ,none recent stage III  . Arthritis     mild lower back  . Fractures     s/p right ankle and right wrist- no problems now  . Hypothyroidism   . Dyslipidemia   .  rectal cancer 12/14/11    Low anterior resection of mid/proximal tumor (at 10 cm) =invasive adenocarcinoma,invaing through the muscularis propria into pericolonic fatty tissue    Past Surgical History  Procedure Date  . Colonoscopy 10/08/2011    Procedure: COLONOSCOPY;  Surgeon: [name], MD;  Location: [facility];  Service: Endoscopy;  Laterality: N/A;  . Tonsillectomy 12-03-11    child  . Proctoscopy 12/14/2011    Procedure: PROCTOSCOPY;  Surgeon: [name], MD;  Location: [facility];  Service: General;  Laterality: N/A;  . Low anterior bowel resection 12/14/2011    Mid/proximal rectal cancer. T3 N0    Family History  Problem Relation Age of Onset  . Diabetes Father   . Heart disease Father   . Stroke Maternal Grandmother   . Stroke Maternal Grandfather   . Hypertension Paternal Grandfather    .     Lung cancer  maternal aunt  Current outpatient prescriptions:warfarin (COUMADIN) 5 MG tablet, Take 5 mg by mouth daily., Disp: , Rfl: ;  aspirin EC 81 MG tablet, Take 81 mg by mouth daily., Disp: , Rfl: ;  atenolol (TENORMIN) 100 MG tablet, Take 100 mg by mouth every evening. , Disp: , Rfl: ;  doxazosin (CARDURA) 2 MG tablet, Take 2 mg by mouth at bedtime., Disp: , Rfl: ;  Ferrous Sulfate Dried 200 (65 FE) MG TABS, Take 1 tab twice daily, Disp: , Rfl:  levothyroxine (SYNTHROID, LEVOTHROID) 25 MCG tablet, Take 25 mcg by  mouth daily with breakfast. , Disp: , Rfl: ;  Multiple Vitamin (MULITIVITAMIN WITH MINERALS) TABS, Take 1 tablet by mouth daily., Disp: , Rfl: ;  simvastatin (ZOCOR) 40 MG tablet, Take 40 mg by mouth every evening., Disp: , Rfl:   Allergies: No Known Allergies  Social History: He lives with his mother. He is self-employed by [employer]. He does not use tobacco or alcohol. He has no transfusion history. He denies risk factors for HIV and hepatitis. He has been disabled since the pulmonary embolism in 2011   ROS:   Positives include: Dyspnea in June of 2011-none at present, rectal bleeding in early 2013, his right arm felt "asleep "for a few days following surgery and may of 2013-resolved, chronic leg swelling with prolonged ambulation  A complete ROS was otherwise negative.  Physical Exam:  Blood pressure 130/63, pulse 60, temperature 97.1 F (36.2 C), temperature source Oral, height 6' (1.829 m), weight 346 lb 3.2 oz (157.035 kg).  HEENT: Oropharynx without visible mass, neck without mass Lungs: Clear bilaterally Cardiac: Regular rate and rhythm Abdomen: No hepatosplenomegaly, mild erythema surrounding the lower portion of the midline incision. The incision has closed. GU: Testes without mass  Vascular: Mild edema at the low leg bilaterally with chronic stasis change Lymph nodes: No cervical, supraclavicular, axillary, or inguinal lymph nodes Neurologic: Alert and oriented, the motor examination appears grossly intact in the upper and lower Jevity's Skin: Mild erythema surrounding the lower portion of the midline surgical incision   LAB:  CBC  Lab Results  Component Value Date   WBC 8.0 12/16/2011   HGB 13.3 12/16/2011   HCT 39.7 12/16/2011   MCV 85.7 12/16/2011   PLT 136* 12/16/2011     CMP      Component Value Date/Time   NA 137 12/15/2011 0503   K 4.6 12/15/2011 0503   CL 101 12/15/2011 0503   CO2 28 12/15/2011 0503   GLUCOSE 154* 12/15/2011 0503   BUN 15  12/15/2011 0503   CREATININE 1.56* 12/15/2011 0503   CALCIUM 8.6 12/15/2011 0503   PROT 6.6 08/28/2011 1325   ALBUMIN 3.2* 08/28/2011 1325   AST 17 08/28/2011 1325   ALT 21 08/28/2011 1325   ALKPHOS 68 08/28/2011 1325   BILITOT 0.3 08/28/2011 1325   GFRNONAA 46* 12/15/2011 0503   GFRAA 54* 12/15/2011 0503     Radiology: As per history of present illness    Assessment/Plan:   1. Rectal cancer, stage II- (pT3,pN0), status post a low anterior resection, the tumor was noted to be at 10 cm on a rigid proctoscopy at the time of surgery  2. History of iron deficiency anemia-likely secondary to rectal bleeding  3. Deep vein thrombosis/pulmonary embolism in June of 2011  4. Renal insufficiency  5. Hypertension     Disposition:   He has been diagnosed with stage II rectal cancer. I discussed the diagnosis, prognosis,  and adjuvant treatment options with Mr. Plemmons and his mother. We reviewed the pathology report in detail. He has an excellent prognosis for a long-term disease-free survival. We discussed the indication for adjuvant therapy. The standard of care for T3 rectal tumors is to deliver adjuvant 5-fluorouracil based chemotherapy and concurrent radiation. There is some evidence that adjuvant radiation can be omitted and "high "rectal tumors.  Dr. Michaell Cowing describes a tumor that was clearly below the peritoneal reflect. I recommend adjuvant therapy in his case. He is maintained on Coumadin anticoagulation after a DVT/pulmonary embolism in 2011. His risk factor for venous thrombosis was likely obesity, but a rectal tumor may have been present at the time. Coumadin was resumed following the low anterior resection.  I recommend concurrent Xeloda chemotherapy and radiation. We reviewed the potential toxicities associated with Xeloda including a chance for a skin rash, hyperpigmentation, mucositis, diarrhea, and the hand/foot syndrome. We also discussed the skin breakdown associated with  5-fluorouracil and radiation. The PT will need to be watched closely while maintained on Xeloda. If the INR increases we will need to consider discontinuing Coumadin and starting Lovenox anticoagulation.  He will be scheduled for a chemotherapy teaching class. Mr. Werts is scheduled to see Dr. Mitzi Hansen on 01/04/2012. We plan on beginning concurrent Xeloda and radiation on 01/14/2012 if Dr. Mitzi Hansen is in agreement.  Mr. Magner will return for an office visit on 01/21/2012. We will obtain a baseline CEA when he returns for chemotherapy teaching class.  Aeron Lheureux 01/01/2012, 5:28 PM

## 2012-01-01 NOTE — Progress Notes (Signed)
[facility] Brief Psychosocial Assessment Clinical Social Work  Clinical Social Work was referred by Systems developer for assessment of psychosocial needs.  Clinical Social Worker met with patient and patient's mother in the exam room at [facility] to offer support and assess for needs.  Pt informed CSW that he recently started receiving disability, and applied for Medicaid.  Pt expressed concern that he would no longer qualify for Medicaid due to his monthly disability income.  Pt also inquired about receiving Medicare through his disability benefit.  CSW encouraged pt to contact his assigned caseworker at DSS and Social Security to update his application and clarify benefits through disability.  Pt stated he had contact information for both case workers and would make contact.  CSW provided pt with contact information and encouraged him to call if he has any difficulty contacting his caseworker.        Patient identified barriers to care as: Insurance financial concerns  Tamala Julian, MSW, LCSW Clinical Social Worker [facility] [phone]

## 2012-01-03 ENCOUNTER — Encounter (INDEPENDENT_AMBULATORY_CARE_PROVIDER_SITE_OTHER): Payer: Self-pay | Admitting: Surgery

## 2012-01-03 ENCOUNTER — Encounter: Payer: Self-pay | Admitting: *Deleted

## 2012-01-03 ENCOUNTER — Ambulatory Visit (INDEPENDENT_AMBULATORY_CARE_PROVIDER_SITE_OTHER): Payer: Self-pay | Admitting: Surgery

## 2012-01-03 ENCOUNTER — Encounter: Payer: Self-pay | Admitting: Radiation Oncology

## 2012-01-03 VITALS — BP 124/90 | HR 80 | Temp 98.4°F | Resp 16 | Ht 71.5 in | Wt 345.6 lb

## 2012-01-03 DIAGNOSIS — C20 Malignant neoplasm of rectum: Secondary | ICD-10-CM

## 2012-01-03 DIAGNOSIS — IMO0002 Reserved for concepts with insufficient information to code with codable children: Secondary | ICD-10-CM | POA: Insufficient documentation

## 2012-01-03 MED ORDER — SULFAMETHOXAZOLE-TRIMETHOPRIM 800-160 MG PO TABS: 2.0000 | ORAL_TABLET | Freq: Two times a day (BID) | ORAL | Status: AC

## 2012-01-03 NOTE — Progress Notes (Signed)
Subjective:     Patient ID: Richard Warner, male   DOB: 1951/07/14, 61 y.o.   MRN: [id number]  HPI  Richard Warner  August 25, 1950 [id number]  Patient Care Team: [name], MD as PCP - General (Family Medicine) [name], MD as Consulting Physician (Gastroenterology) [name], MD as Consulting Physician (Pulmonary Disease) [name], MD as Consulting Physician (Cardiology) [name], MD as Consulting Physician (Medical Oncology)  This patient is a 61 y.o.male who presents today for surgical evaluation.   Procedure: Low anterior rectosigmoid resection of mid rectal cancer 12/14/2011  The patient comes with his mother. Soreness going down. Eating well. Having one to 2 bowel movements a day. Or form. Energy good. No fevers or chills.  He did notice some increased redness at the lower end of his low midline incision. Noted drainage yesterday - clear thin, slightly bloody fluid. No pus. Was recommended he come to see me.  Patient Active Problem List  Diagnoses  . HYPOTHYROIDISM  . DYSLIPIDEMIA  . Obesity, Class III, BMI 40-49.9 (morbid obesity)  . HYPERTENSION  . PULMONARY EMBOLISM AND INFARCTION  . RENAL DISEASE, CHRONIC, STAGE III  . DVT (deep venous thrombosis)  . Iron deficiency anemia, unspecified  . Benign neoplasm of colon  . Clotting disorder  . 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 Seroma, postoperative with possible cellulitis    Past Medical History  Diagnosis Date  . Hypertension   . Pulmonary embolism 12-03-11    01-17-10 s/p DVT left leg -Coumadin tx.until 08-28-11  . DVT (deep vein thrombosis) in pregnancy 12-03-11    6'2011-left leg  . 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 Hyperlipidemia   . Leg swelling 12-03-11    bilateral if up most of day,equally swells, up to knee level  . Shortness of breath 12-03-11    hx. 6'11- during pulmonary emboli episode, none now, extreme exertion only.  . Chronic  kidney disease 12-03-11    kidney stone x1 ,none recent stage III  . Arthritis 12-03-11    mild lower back  . Fractures 12-03-11    s/p right ankle and right wrist- no problems now  . Hypothyroidism   . Dyslipidemia   . 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 Anemia 12-03-11    tx. oral iron supplement    Past Surgical History  Procedure Date  . Colonoscopy 10/08/2011    Procedure: COLONOSCOPY;  Surgeon: [name], MD;  Location: [facility];  Service: Endoscopy;  Laterality: N/A;  . Tonsillectomy 12-03-11    child  . Proctoscopy 12/14/2011    Procedure: PROCTOSCOPY;  Surgeon: [name], MD;  Location: [facility];  Service: General;  Laterality: N/A;  . Low anterior bowel resection 12/14/2011    Mid/proximal rectal cancer. T3 N0    History   Social History  . Marital Status: Single    Spouse Name: N/A    Number of Children: N/A  . Years of Education: N/A   Occupational History  . MERCHANT     SELF EMPLOYED   Social History Main Topics  . Smoking status: Never Smoker   . Smokeless tobacco: Never Used  . Alcohol Use: No  . Drug Use: No  . Sexually Active: Not on file   Other Topics Concern  . Not on file   Social History Narrative  .  No narrative on file    Family History  Problem Relation Age of Onset  . Diabetes Father   . Heart disease Father   . Stroke Maternal Grandmother   . Stroke Maternal Grandfather   . Hypertension Paternal Grandfather   . Lung cancer Paternal Grandfather   . Cancer Paternal Grandfather     Current Outpatient Prescriptions  Medication Sig Dispense Refill  . aspirin EC 81 MG tablet Take 81 mg by mouth daily.      [name] atenolol (TENORMIN) 100 MG tablet Take 100 mg by mouth every evening.       [name] doxazosin (CARDURA) 2 MG tablet Take 2 mg by mouth at bedtime.      . Ferrous Sulfate Dried 200 (65 FE) MG TABS  Take 1 tab twice daily      . levothyroxine (SYNTHROID, LEVOTHROID) 25 MCG tablet Take 25 mcg by mouth daily with breakfast.       . Multiple Vitamin (MULITIVITAMIN WITH MINERALS) TABS Take 1 tablet by mouth daily.      . simvastatin (ZOCOR) 40 MG tablet Take 40 mg by mouth every evening.      . warfarin (COUMADIN) 5 MG tablet Take 5 mg by mouth daily.      [name] sulfamethoxazole-trimethoprim (BACTRIM DS,SEPTRA DS) 800-160 MG per tablet Take 2 tablets by mouth 2 (two) times daily.  28 tablet  2     No Known Allergies  BP 124/90  Pulse 80  Temp(Src) 98.4 F (36.9 C) (Temporal)  Resp 16  Ht 5' 11.5" (1.816 m)  Wt 345 lb 9.6 oz (156.763 kg)  BMI 47.53 kg/m2  No results found.   Review of Systems  Constitutional: Negative for fever, chills and diaphoresis.  HENT: Negative for sore throat, trouble swallowing and neck pain.   Eyes: Negative for photophobia and visual disturbance.  Respiratory: Negative for choking and shortness of breath.   Cardiovascular: Negative for chest pain and palpitations.  Gastrointestinal: Negative for nausea, vomiting, abdominal distention, anal bleeding and rectal pain.  Genitourinary: Negative for dysuria, urgency, difficulty urinating and testicular pain.  Musculoskeletal: Negative for myalgias, arthralgias and gait problem.  Skin: Negative for color change and rash.  Neurological: Negative for dizziness, speech difficulty, weakness and numbness.  Hematological: Negative for adenopathy.  Psychiatric/Behavioral: Negative for hallucinations, confusion and agitation.       Objective:   Physical Exam  Constitutional: He is oriented to person, place, and time. He appears well-developed and well-nourished. No distress.  HENT:  Head: Normocephalic.  Mouth/Throat: Oropharynx is clear and moist. No oropharyngeal exudate.  Eyes: Conjunctivae and EOM are normal. Pupils are equal, round, and reactive to light. No scleral icterus.  Neck: Normal range of motion. No  tracheal deviation present.  Cardiovascular: Normal rate, normal heart sounds and intact distal pulses.   Pulmonary/Chest: Effort normal. No respiratory distress.  Abdominal: Soft. He exhibits no distension. There is no tenderness. Hernia confirmed negative in the right inguinal area and confirmed negative in the left inguinal area.         Incisions clean with normal healing ridges.  No hernias  Musculoskeletal: Normal range of motion. He exhibits no tenderness.  Neurological: He is alert and oriented to person, place, and time. No cranial nerve deficit. He exhibits normal muscle tone. Coordination normal.  Skin: Skin is warm and dry. No rash noted. He is not diaphoretic.  Psychiatric: He has a normal mood and affect. His behavior is normal.  Assessment:     T3N0 rectal cancer  Post-op seroma, possible cellultis    Plan:     -Bactrim x7d  -wick in wound change 1-2x/week,   -postadj oral Xeloda & XRT per Med/Rad Onc - may have to delay until wound healed  -Increase activity as tolerated.  Do not push through pain.  Advanced on diet as tolerated. Bowel regimen to avoid problems.  Return to clinic next week to follow seroma/cellulitis.  Pt & mother expressed understanding and appreciation

## 2012-01-03 NOTE — Patient Instructions (Signed)

## 2012-01-04 ENCOUNTER — Encounter: Payer: Self-pay | Admitting: Radiation Oncology

## 2012-01-04 ENCOUNTER — Ambulatory Visit
Admission: RE | Admit: 2012-01-04 | Discharge: 2012-01-04 | Disposition: A | Discharge status: Home / Self Care | Payer: Medicaid Other | Source: Ambulatory Visit | Attending: Radiation Oncology | Admitting: Radiation Oncology

## 2012-01-04 ENCOUNTER — Encounter: Payer: Self-pay | Admitting: *Deleted

## 2012-01-04 ENCOUNTER — Encounter: Payer: Self-pay | Admitting: Oncology

## 2012-01-04 VITALS — BP 122/80 | HR 63 | Temp 97.6°F | Resp 20 | Wt 349.2 lb

## 2012-01-04 VITALS — BP 122/80 | HR 67 | Temp 97.2°F | Resp 20 | Wt 349.3 lb

## 2012-01-04 DIAGNOSIS — Z79899 Other long term (current) drug therapy: Secondary | ICD-10-CM | POA: Insufficient documentation

## 2012-01-04 DIAGNOSIS — N189 Chronic kidney disease, unspecified: Secondary | ICD-10-CM | POA: Insufficient documentation

## 2012-01-04 DIAGNOSIS — C189 Malignant neoplasm of colon, unspecified: Secondary | ICD-10-CM

## 2012-01-04 DIAGNOSIS — C19 Malignant neoplasm of rectosigmoid junction: Secondary | ICD-10-CM | POA: Insufficient documentation

## 2012-01-04 DIAGNOSIS — Z510 Encounter for antineoplastic radiation therapy: Secondary | ICD-10-CM | POA: Insufficient documentation

## 2012-01-04 DIAGNOSIS — Z86718 Personal history of other venous thrombosis and embolism: Secondary | ICD-10-CM | POA: Insufficient documentation

## 2012-01-04 DIAGNOSIS — I129 Hypertensive chronic kidney disease with stage 1 through stage 4 chronic kidney disease, or unspecified chronic kidney disease: Secondary | ICD-10-CM | POA: Insufficient documentation

## 2012-01-04 DIAGNOSIS — C20 Malignant neoplasm of rectum: Secondary | ICD-10-CM

## 2012-01-04 NOTE — Progress Notes (Signed)
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

## 2012-01-04 NOTE — Progress Notes (Signed)
Faxed patient assistance for xeloda to [facility] @ [phone].

## 2012-01-04 NOTE — Progress Notes (Signed)
Please see the Nurse Progress Note in the MD Initial Consult Encounter for this patient. 

## 2012-01-04 NOTE — Progress Notes (Signed)
Path: Colon ,segmental resection for tumor,Sigmoid=invasive adenocarcinoma,invading through the muscularis propria into pericolonic fatty tissue (0/12) lymph nodes neg for malignancy,resections margins neg, (pT3,pNO)  Single, 1 daughter, self employed,     Allergies:NKDA

## 2012-01-04 NOTE — Progress Notes (Signed)
Previous progress note  Entered in error

## 2012-01-08 ENCOUNTER — Encounter: Payer: Self-pay | Admitting: Oncology

## 2012-01-08 NOTE — Progress Notes (Signed)
[facility] Radiation Oncology NEW PATIENT EVALUATION  Name: Richard Warner MRN: [id number]  Date:   01/04/2012           DOB: 01-14-1951  Status: outpatient   CC: Richard Pearson, MD, MD  Richard Cowing Salvatore Decent., MD   G. Rolm Baptise, MD    REFERRING PHYSICIAN: Michaell Cowing Salvatore Decent., MD   DIAGNOSIS: The primary encounter diagnosis was Colon cancer. A diagnosis of Rectal cancer, pT3pN0 (0/12 LN) - mid rectum was also pertinent to this visit.    HISTORY OF PRESENT ILLNESS:  Richard Warner is a 61 y.o. male who is seen today for an initial consultation visit. The patient indicates that he noticed some bleeding prior to Christmas of 2012. He was on Coumadin and the.think too much about this. This persisted however and he therefore was referred and underwent a colonoscopy on 10/08/2011. A couple of polyps were found which were adenomas and a tumor in the sigmoid colon was also seen. This was described at 20 cm from the anus. This returned positive for an adenocarcinoma. The patient has proceeded with a CT scan of the chest and this did not reveal any evidence of metastatic disease. He has also had a CT scan of the abdomen and no suspicious findings were seen. No adenopathy. The patient was seen by Dr. gross and he went for surgery on 12/14/2011. A laparoscopic assisted low anterior resection was performed. The rectal cancer was seen at 10-13 cm from the anal verge. It was also noted that the tumor was distal to the peritoneal reflection. A Musa rectal excision was completed. The final pathology from this specimen revealed an invasive adenocarcinoma. This represented a T3 N0 tumor pathologically. A total of 12 negative lymph nodes were removed. The margins were negative. No lymphovascular invasion or perineural invasion were identified.  The patient indicates that he has been doing relatively well in terms of healing. He notes that his wound opened a little bit yesterday and he has  been placed on some antibiotics. He notes that he has not had any other major problems and no other signs of infection. He did have a little bit of an abscess which was opened and therefore he does have some additional healing to complete.  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     PAST SURGICAL HISTORY:  Past Surgical History  Procedure Date  . Colonoscopy 10/08/2011    Procedure: COLONOSCOPY;  Surgeon: Richard Meckel, MD;  Location: [facility];  Service: Endoscopy;  Laterality: N/A;  . Tonsillectomy 12-03-11    child  . Proctoscopy 12/14/2011    Procedure: PROCTOSCOPY;  Surgeon: Richard Sportsman, MD;  Location: [facility];  Service: General;  Laterality: N/A;  . Low anterior bowel resection 12/14/2011    Mid/proximal rectal cancer. T3 N0     FAMILY HISTORY: family history includes Cancer in his paternal grandfather; Diabetes in his father; Heart disease in his father; Hypertension in his paternal grandfather; Lung cancer in his paternal grandfather; and Stroke in his maternal grandfather and maternal grandmother.   SOCIAL HISTORY:  reports that he has never smoked. He has never used smokeless tobacco. He reports that he does  not drink alcohol or use illicit drugs.   ALLERGIES: Review of patient's allergies indicates no known allergies.   MEDICATIONS:  Current Outpatient Prescriptions  Medication Sig Dispense Refill  . aspirin EC 81 MG tablet Take 81 mg by mouth daily.      [name] atenolol (TENORMIN) 100 MG tablet Take 100 mg by mouth every evening.       [name] doxazosin (CARDURA) 2 MG  tablet Take 2 mg by mouth at bedtime.      . Ferrous Sulfate Dried 200 (65 FE) MG TABS Take 1 tab twice daily      . levothyroxine (SYNTHROID, LEVOTHROID) 25 MCG tablet Take 25 mcg by mouth daily with breakfast.       . Multiple Vitamin (MULITIVITAMIN WITH MINERALS) TABS Take 1 tablet by mouth daily.      . simvastatin (ZOCOR) 40 MG tablet Take 40 mg by mouth every evening.      . sulfamethoxazole-trimethoprim (BACTRIM DS,SEPTRA DS) 800-160 MG per tablet Take 2 tablets by mouth 2 (two) times daily.  28 tablet  2  . warfarin (COUMADIN) 5 MG tablet Take 5 mg by mouth daily.         REVIEW OF SYSTEMS:  A comprehensive review of systems was negative. other than the issue of the wound which is open the little bit he is doing well with no significant complaints currently    PHYSICAL EXAM:  weight is 349 lb 4.8 oz (158.441 kg). His oral temperature is 97.2 F (36.2 C). His blood pressure is 122/80 and his pulse is 67. His respiration is 20.   General: Well-developed, in no acute distress HEENT: Normocephalic, atraumatic; oral cavity clear Neck: Supple without any lymphadenopathy Cardiovascular: Regular rate and rhythm Respiratory: Clear to auscultation bilaterally GI: Soft, nontender, normal bowel sounds; the wound has area which has opened a little was some moderate surrounding erythema; otherwise the wound looks very good and has closed nicely Extremities: No edema present Neuro: No focal deficits    LABORATORY DATA:  Lab Results  Component Value Date   WBC 8.0 12/16/2011   HGB 13.3 12/16/2011   HCT 39.7 12/16/2011   MCV 85.7 12/16/2011   PLT 136* 12/16/2011   Lab Results  Component Value Date   NA 137 12/15/2011   K 4.6 12/15/2011   CL 101 12/15/2011   CO2 28 12/15/2011   Lab Results  Component Value Date   ALT 21 08/28/2011   AST 17 08/28/2011   ALKPHOS 68 08/28/2011   BILITOT 0.3 08/28/2011      IMPRESSION: Pleasant 61 year old male status post resection of a T3 N0 adenocarcinoma  of the proximal to mid rectum.   PLAN: The patient has undergone surgical resection and has done well. On initial workup it was felt that this was more of a sigmoid tumor but on Richard proctoscopy and at the time of surgery it revealed itself more as a more proximal rectal tumor. It did extend distal to the peritoneal reflection which was noted at the time of surgery.  I discussed with the patient that standard treatment for rectal cancer typically for a T3 tumor would involve adjuvant chemoradiotherapy. The patient falls into a fairly favorable category in terms of its more proximal location and the fact that he was T3 N0 with 12 negative lymph nodes removed. The benefit for him would be smaller than a more distal tumor, but in reviewing his case and discussing things with him today I believe that it would  be reasonable to proceed with a course of adjuvant chemoradiotherapy. The patient has seen Dr. Truett Perna who has discussed this option with him as well and he is being set up to receive Xeloda.  I would anticipate a 5-1/2 week course of adjuvant radiation. I discussed this with him including the rationale of this treatment and also the side effects and risks of such a treatment. All of his questions were answered. The patient indicated that he did wish to proceed with this. He will therefore be scheduled for a simulation such that we can proceed with treatment planning. The patient does need to heal up a little bit more in terms of his wound which opened up with a local infection. I will see how this is healing when he returns for simulation. Based on how it looks today I believe that starting him on 01/14/2012 may be a little bit early. Therefore I discussed him possibly starting on 01/21/2012 which I believe would still be fine in terms of his timeframe from surgery.

## 2012-01-08 NOTE — Progress Notes (Signed)
Patient has been approved through [place] for free Xeloda from 01/08/12-01/07/13.  The medication will be shipped to his home.

## 2012-01-10 ENCOUNTER — Ambulatory Visit
Admission: RE | Admit: 2012-01-10 | Discharge: 2012-01-10 | Disposition: A | Discharge status: Home / Self Care | Payer: Medicaid Other | Source: Ambulatory Visit | Attending: Radiation Oncology | Admitting: Radiation Oncology

## 2012-01-10 ENCOUNTER — Encounter (INDEPENDENT_AMBULATORY_CARE_PROVIDER_SITE_OTHER): Payer: Self-pay | Admitting: Surgery

## 2012-01-10 ENCOUNTER — Ambulatory Visit (INDEPENDENT_AMBULATORY_CARE_PROVIDER_SITE_OTHER): Payer: Self-pay | Admitting: Surgery

## 2012-01-10 VITALS — BP 124/82 | HR 80 | Temp 97.4°F | Resp 16 | Ht 71.5 in | Wt 354.4 lb

## 2012-01-10 DIAGNOSIS — C20 Malignant neoplasm of rectum: Secondary | ICD-10-CM

## 2012-01-10 DIAGNOSIS — IMO0002 Reserved for concepts with insufficient information to code with codable children: Secondary | ICD-10-CM

## 2012-01-10 NOTE — Progress Notes (Addendum)
  Radiation Oncology         [phone]) [phone] ________________________________  Name: Richard Warner MRN: [id number]  Date: 01/10/2012  DOB: [birth year]  SIMULATION AND TREATMENT PLANNING NOTE  The patient presented for simulation for the patient's upcoming course of preoperative radiation for the diagnosis of rectal cancer. The patient was placed in a supine position. A customized alpha cradle was constructed toaid in patient immobilization. This complex treatment device will be used on a daily basis during the treatment. In this fashion a CT scan was obtained through the pelvic region and the isocenter was placed near midline within the pelvis.  The patient will initially be planned to receive a course of radiation to a dose of 45 gray. This will be accomplished in 25 fractions at 1.8 gray per fraction. This initial treatment will correspond to a 3-D conformal technique. The clinical tumor volume has been contoured in addition to the rectum, bladder and femoral heads. DVH's of each of these structures have been requested and these will be carefully reviewed as part of the 3-D conformal treatment planning process. To accomplish this initial treatment, 4 customized blocks have been designed for this purpose. Each of these 4 complex treatment devices will be used on a daily basis during the initial course of his treatment. It is anticipated that the patient will then receive a boost for an additional 5.4 gray. The anticipated total dose therefore will be 50.4 gray.   Special treatment procedure The patient will receive chemotherapy during the course of radiation treatment. The patient may experience increased or overlapping toxicity due to this combined-modality approach and the patient will be monitored for such problems. This may include extra lab work as necessary. This therefore constitutes a special treatment procedure.    ________________________________  Radene Gunning, MD,  PhD  Addendum Due to the patient's difficulties with wound healing, an additional avoidance structure was contoured corresponding to the surgical scar. Avoiding this area prompted a change in his planned to a 6 field 3-D conformal technique. Each of these 6 complex treatment devices are planned to be used on a daily basis.

## 2012-01-10 NOTE — Progress Notes (Signed)
Subjective:     Patient ID: Richard Warner, male   DOB: 1950-10-01, 61 y.o.   MRN: [id number]  HPI   Richard Warner  1951/03/24 [id number]  Patient Care Team: [name], MD as PCP - General (Family Medicine) [name], MD as Consulting Physician (Gastroenterology) [name], MD as Consulting Physician (Pulmonary Disease) [name], MD as Consulting Physician (Cardiology) [name], MD as Consulting Physician (Medical Oncology)  This patient is a 61 y.o.male who presents today for surgical evaluation.   Procedure: Low anterior rectosigmoid resection of mid rectal cancer 12/14/2011  The patient comes with his mother. Soreness going down. Eating well. Having one to 2 bowel movements a day. Or form. Energy good. No fevers or chills. He did notice some increased redness at the lower end of his low midline incision. I drained a seroma & had him take antibiotics for cellulitis.    He is just complaining on box. The drainage has gone down. He feels better. He is due to start out oral chemotherapy and radiation therapy later this month. There is concern about starting it with an open wound.  Patient Active Problem List  Diagnoses  . HYPOTHYROIDISM  . DYSLIPIDEMIA  . Obesity, Class III, BMI 40-49.9 (morbid obesity)  . HYPERTENSION  . PULMONARY EMBOLISM AND INFARCTION  . RENAL DISEASE, CHRONIC, STAGE III  . DVT (deep venous thrombosis)  . Iron deficiency anemia, unspecified  . Benign neoplasm of colon  . Clotting disorder  . 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 Seroma, postoperative with possible cellulitis  . Colon cancer    Past Medical History  Diagnosis Date  . Hypertension   . Pulmonary embolism 12-03-11    01-17-10 s/p DVT left leg -Coumadin tx.until 08-28-11  . DVT (deep vein thrombosis) in pregnancy 12-03-11    6'2011-left leg  . Hyperlipidemia   . Leg swelling 12-03-11    bilateral if up most of day,equally swells, up to  knee level  . Shortness of breath 12-03-11    hx. 6'11- during pulmonary emboli episode, none now, extreme exertion only.  . Fractures 12-03-11    s/p right ankle and right wrist- no problems now  . Hypothyroidism   . Dyslipidemia   . Anemia 12-03-11    tx. oral iron supplement  . Arthritis 12-03-11    mild lower back  . Chronic kidney disease 12-03-11    kidney stone x1 ,none recent stage III  . 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 Colonoscopy 10/08/2011    Procedure: COLONOSCOPY;  Surgeon: [name], MD;  Location: [facility];  Service: Endoscopy;  Laterality: N/A;  . Tonsillectomy 12-03-11    child  . Proctoscopy 12/14/2011    Procedure: PROCTOSCOPY;  Surgeon: [name], MD;  Location: [facility];  Service: General;  Laterality: N/A;  . Low anterior bowel resection 12/14/2011    Mid/proximal rectal cancer. T3 N0    History   Social History  . Marital Status: Single    Spouse Name: N/A    Number of Children: N/A  . Years of Education: N/A   Occupational History  . MERCHANT     SELF EMPLOYED   Social History Main Topics  . Smoking status: Never Smoker   .  Smokeless tobacco: Never Used  . Alcohol Use: No  . Drug Use: No  . Sexually Active: Not on file   Other Topics Concern  . Not on file   Social History Narrative  . No narrative on file    Family History  Problem Relation Age of Onset  . Diabetes Father   . Heart disease Father   . Stroke Maternal Grandmother   . Stroke Maternal Grandfather   . Hypertension Paternal Grandfather   . Lung cancer Paternal Grandfather   . Cancer Paternal Grandfather     Current Outpatient Prescriptions  Medication Sig Dispense Refill  . aspirin EC 81 MG tablet Take 81 mg by mouth daily.      [name]  atenolol (TENORMIN) 100 MG tablet Take 100 mg by mouth every evening.       [name] doxazosin (CARDURA) 2 MG tablet Take 2 mg by mouth at bedtime.      . Ferrous Sulfate Dried 200 (65 FE) MG TABS Take 1 tab twice daily      . levothyroxine (SYNTHROID, LEVOTHROID) 25 MCG tablet Take 25 mcg by mouth daily with breakfast.       . Multiple Vitamin (MULITIVITAMIN WITH MINERALS) TABS Take 1 tablet by mouth daily.      . simvastatin (ZOCOR) 40 MG tablet Take 40 mg by mouth every evening.      . sulfamethoxazole-trimethoprim (BACTRIM DS,SEPTRA DS) 800-160 MG per tablet Take 2 tablets by mouth 2 (two) times daily.  28 tablet  2  . warfarin (COUMADIN) 5 MG tablet Take 5 mg by mouth daily.         No Known Allergies  BP 124/82  Pulse 80  Temp 97.4 F (36.3 C)  Resp 16  Ht 5' 11.5" (1.816 m)  Wt 354 lb 6.4 oz (160.755 kg)  BMI 48.74 kg/m2  No results found.   Review of Systems  Constitutional: Negative for fever, chills and diaphoresis.  HENT: Negative for sore throat, trouble swallowing and neck pain.   Eyes: Negative for photophobia and visual disturbance.  Respiratory: Negative for choking and shortness of breath.   Cardiovascular: Negative for chest pain and palpitations.  Gastrointestinal: Negative for nausea, vomiting, abdominal distention, anal bleeding and rectal pain.  Genitourinary: Negative for dysuria, urgency, difficulty urinating and testicular pain.  Musculoskeletal: Negative for myalgias, arthralgias and gait problem.  Skin: Negative for color change and rash.  Neurological: Negative for dizziness, speech difficulty, weakness and numbness.  Hematological: Negative for adenopathy.  Psychiatric/Behavioral: Negative for hallucinations, confusion and agitation.       Objective:   Physical Exam  Constitutional: He is oriented to person, place, and time. He appears well-developed and well-nourished. No distress.  HENT:  Head: Normocephalic.  Mouth/Throat: Oropharynx is clear and  moist. No oropharyngeal exudate.  Eyes: Conjunctivae and EOM are normal. Pupils are equal, round, and reactive to light. No scleral icterus.  Neck: Normal range of motion. No tracheal deviation present.  Cardiovascular: Normal rate, normal heart sounds and intact distal pulses.   Pulmonary/Chest: Effort normal. No respiratory distress.  Abdominal: Soft. He exhibits no distension. There is no tenderness. Hernia confirmed negative in the right inguinal area and confirmed negative in the left inguinal area.         Incisions clean with normal healing ridges.  No hernias  Musculoskeletal: Normal range of motion. He exhibits no tenderness.  Neurological: He is alert and oriented to person, place, and time. No cranial nerve deficit. He exhibits  normal muscle tone. Coordination normal.  Skin: Skin is warm and dry. No rash noted. He is not diaphoretic.  Psychiatric: He has a normal mood and affect. His behavior is normal.       Assessment:     T3N0 rectal cancer  Post-op seroma, possible cellultis    Plan:     -wick in wound change 1-2x/week, nurse only on Monday 10Jun.  RTC to see me in 7-10 days  -postadj oral Xeloda & XRT per Med/Rad Onc - wound more superficial now, so hopefully will be closed enough in 2 weeks  -Increase activity as tolerated.  Do not push through pain.  Advanced on diet as tolerated. Bowel regimen to avoid problems.  -Pt & mother expressed understanding and appreciation

## 2012-01-14 ENCOUNTER — Encounter (INDEPENDENT_AMBULATORY_CARE_PROVIDER_SITE_OTHER): Payer: Self-pay

## 2012-01-14 ENCOUNTER — Ambulatory Visit (INDEPENDENT_AMBULATORY_CARE_PROVIDER_SITE_OTHER): Payer: Self-pay | Admitting: General Surgery

## 2012-01-14 VITALS — BP 138/88 | HR 68 | Temp 96.8°F | Resp 16 | Ht 71.5 in | Wt 350.6 lb

## 2012-01-14 DIAGNOSIS — Z9049 Acquired absence of other specified parts of digestive tract: Secondary | ICD-10-CM

## 2012-01-15 ENCOUNTER — Ambulatory Visit: Payer: Self-pay | Admitting: Nutrition

## 2012-01-15 ENCOUNTER — Encounter: Payer: Self-pay | Admitting: Oncology

## 2012-01-15 ENCOUNTER — Other Ambulatory Visit: Payer: Self-pay

## 2012-01-15 ENCOUNTER — Encounter: Payer: Self-pay | Admitting: *Deleted

## 2012-01-15 ENCOUNTER — Other Ambulatory Visit (HOSPITAL_BASED_OUTPATIENT_CLINIC_OR_DEPARTMENT_OTHER): Payer: Medicaid Other | Admitting: Lab

## 2012-01-15 DIAGNOSIS — C20 Malignant neoplasm of rectum: Secondary | ICD-10-CM

## 2012-01-15 DIAGNOSIS — Z7901 Long term (current) use of anticoagulants: Secondary | ICD-10-CM

## 2012-01-15 DIAGNOSIS — Z5181 Encounter for therapeutic drug level monitoring: Secondary | ICD-10-CM

## 2012-01-15 LAB — CBC WITH DIFFERENTIAL/PLATELET
BASO%: 0.4 % (ref 0.0–2.0)
EOS%: 3.3 % (ref 0.0–7.0)
LYMPH%: 20.6 % (ref 14.0–49.0)
MCHC: 34.6 g/dL (ref 32.0–36.0)
MCV: 88.7 fL (ref 79.3–98.0)
MONO%: 8.6 % (ref 0.0–14.0)
Platelets: 151 10*3/uL (ref 140–400)
RBC: 4.94 10*6/uL (ref 4.20–5.82)
RDW: 13.9 % (ref 11.0–14.6)
WBC: 5.1 10*3/uL (ref 4.0–10.3)

## 2012-01-15 LAB — COMPREHENSIVE METABOLIC PANEL
AST: 21 U/L (ref 0–37)
Albumin: 4.2 g/dL (ref 3.5–5.2)
Alkaline Phosphatase: 64 U/L (ref 39–117)
Calcium: 9.4 mg/dL (ref 8.4–10.5)
Chloride: 106 mEq/L (ref 96–112)
Glucose, Bld: 102 mg/dL — ABNORMAL HIGH (ref 70–99)
Potassium: 4.4 mEq/L (ref 3.5–5.3)
Sodium: 141 mEq/L (ref 135–145)
Total Protein: 6.4 g/dL (ref 6.0–8.3)

## 2012-01-15 LAB — PROTIME-INR: Protime: 39.6 Seconds — ABNORMAL HIGH (ref 10.6–13.4)

## 2012-01-15 NOTE — Assessment & Plan Note (Signed)
Richard Warner is a 61 year old male patient of Dr. Truett Perna, diagnosed with stage II rectal cancer, status post low anterior bowel resection.  HISTORY:  Includes DVT, pulmonary embolism, hypertension, hyperlipidemia, bilateral edema, anemia, CKD stage III, arthritis, iron-deficiency anemia.  MEDICATIONS:  Include Cardura, ferrous sulfate, Synthroid, multivitamin, Zocor, Coumadin.  LABORATORIES:  Labs include glucose of 154 and creatinine of 1.56 on May 11th.  HEIGHT:  71.5 inches. WEIGHT:  350.6 pounds June 10th. USUAL BODY WEIGHT:  369 pounds on February 8th. BMI:  48.22.  The patient reports that he is eating less than he was, but he has incorporated breakfast into his daily regimen.  His usual habit was to eat his first meal at 1 o'clock in the afternoon.  However, since surgery he has been trying to eat 3 meals a day.  He does eat less than he used to, possibly accounting for his 19-pound weight loss over the last 4 months.  Patient states he tries not to eat red meat, but does consume Chicken, [place] and fish, along with some yogurt.  He is not a milk drinker.  NUTRITION DIAGNOSIS:  Food and nutrition-related knowledge deficit related to new diagnosis of rectal cancer and associated treatments as evidenced by no prior need for nutrition-related information.  RESOLVED.  INTERVENTION:  I educated the patient on the importance of regular meals with higher protein foods and adequate calories to minimize weight loss throughout treatment.  I discussed the importance of trying to consume a healthy diet based on side effects.  We briefly discussed the possibility of patient developing diarrhea secondary to treatment and I have educated him on strategies of foods he should consume and avoid during this time if he should develop diarrhea.  I have discouraged the patient from limiting his caloric intake so that he loses a lot of weight during treatment and discussed with him the dangers of malnutrition  during therapy.  MONITORING/EVALUATION (GOALS):  Patient will tolerate a healthy plant-based diet with adequate protein and calories to minimize weight loss throughout treatment.  He will alter his diet based on his side effects.  NEXT VISIT:  Patient will call if he has questions or concerns or would like to have a followup.    ______________________________ Zenovia Jarred, RD, LDN Clinical Nutrition Specialist BN/MEDQ  D:  01/15/2012  T:  01/15/2012  Job:  [id number]

## 2012-01-15 NOTE — Progress Notes (Signed)
Patient approved for 40% EPP discount for family of 1 income 20580.00

## 2012-01-16 ENCOUNTER — Telehealth: Payer: Self-pay | Admitting: *Deleted

## 2012-01-16 ENCOUNTER — Encounter (INDEPENDENT_AMBULATORY_CARE_PROVIDER_SITE_OTHER): Payer: Self-pay

## 2012-01-16 DIAGNOSIS — I82409 Acute embolism and thrombosis of unspecified deep veins of unspecified lower extremity: Secondary | ICD-10-CM

## 2012-01-16 NOTE — Telephone Encounter (Signed)
Spoke with pt, he is scheduled for lab and MD 6/17. Due to begin Xeloda and radiation on 01/23/12. Asking if PT check can wait until 6/17. Will forward request to Dr. Truett Perna.

## 2012-01-16 NOTE — Telephone Encounter (Signed)
Ok, decrese coumadin to 2.5 mg daily

## 2012-01-16 NOTE — Telephone Encounter (Signed)
Message copied by Caleb Popp on Wed Jan 16, 2012  3:36 PM ------      Message from: Thornton Papas B      Created: Wed Jan 16, 2012  7:15 AM       Please call patient, check PT 6/14, needs close f/u while on coumadin/xeloda

## 2012-01-17 ENCOUNTER — Telehealth: Payer: Self-pay | Admitting: *Deleted

## 2012-01-17 ENCOUNTER — Encounter: Payer: Self-pay | Admitting: Radiation Oncology

## 2012-01-17 NOTE — Telephone Encounter (Signed)
Reduce coumadin to 2.5 mg daily until seen on 01/21/12.

## 2012-01-18 ENCOUNTER — Ambulatory Visit (INDEPENDENT_AMBULATORY_CARE_PROVIDER_SITE_OTHER): Payer: Self-pay

## 2012-01-18 DIAGNOSIS — T8130XA Disruption of wound, unspecified, initial encounter: Secondary | ICD-10-CM

## 2012-01-18 NOTE — Progress Notes (Signed)
Pt came in for nurse only to have more packing placed b/c when changing the dressing today it fell out. The pt tolerated the packing ok. I did give the pt some supplies to take home in case if the packing fell out over the weekend.

## 2012-01-21 ENCOUNTER — Encounter: Payer: Self-pay | Admitting: Oncology

## 2012-01-21 ENCOUNTER — Other Ambulatory Visit (HOSPITAL_BASED_OUTPATIENT_CLINIC_OR_DEPARTMENT_OTHER): Payer: Medicaid Other | Admitting: Lab

## 2012-01-21 ENCOUNTER — Telehealth: Payer: Self-pay | Admitting: Oncology

## 2012-01-21 ENCOUNTER — Ambulatory Visit (HOSPITAL_BASED_OUTPATIENT_CLINIC_OR_DEPARTMENT_OTHER): Payer: Medicaid Other | Admitting: Oncology

## 2012-01-21 VITALS — BP 139/81 | HR 65 | Temp 98.5°F | Ht 71.5 in | Wt 357.0 lb

## 2012-01-21 DIAGNOSIS — C20 Malignant neoplasm of rectum: Secondary | ICD-10-CM

## 2012-01-21 DIAGNOSIS — N289 Disorder of kidney and ureter, unspecified: Secondary | ICD-10-CM

## 2012-01-21 DIAGNOSIS — Z86711 Personal history of pulmonary embolism: Secondary | ICD-10-CM

## 2012-01-21 DIAGNOSIS — Z86718 Personal history of other venous thrombosis and embolism: Secondary | ICD-10-CM

## 2012-01-21 DIAGNOSIS — Z7901 Long term (current) use of anticoagulants: Secondary | ICD-10-CM

## 2012-01-21 DIAGNOSIS — I82409 Acute embolism and thrombosis of unspecified deep veins of unspecified lower extremity: Secondary | ICD-10-CM

## 2012-01-21 DIAGNOSIS — Z5181 Encounter for therapeutic drug level monitoring: Secondary | ICD-10-CM

## 2012-01-21 LAB — CBC WITH DIFFERENTIAL/PLATELET
Basophils Absolute: 0 10*3/uL (ref 0.0–0.1)
Eosinophils Absolute: 0.1 10*3/uL (ref 0.0–0.5)
HGB: 15.5 g/dL (ref 13.0–17.1)
MONO#: 0.5 10*3/uL (ref 0.1–0.9)
MONO%: 8.7 % (ref 0.0–14.0)
NEUT#: 4.2 10*3/uL (ref 1.5–6.5)
RBC: 5.04 10*6/uL (ref 4.20–5.82)
RDW: 13.9 % (ref 11.0–14.6)
WBC: 6 10*3/uL (ref 4.0–10.3)
lymph#: 1.1 10*3/uL (ref 0.9–3.3)

## 2012-01-21 LAB — PROTIME-INR
INR: 1.7 — ABNORMAL LOW (ref 2.00–3.50)
Protime: 20.4 Seconds — ABNORMAL HIGH (ref 10.6–13.4)

## 2012-01-21 NOTE — Progress Notes (Signed)
Patient came in today with proof that he was approved for medicaid, patient at this time has not received card in mail yet, I went on ahead and made changes to his account to show medicaid I explain to the patient that when he gets the medicaid card he needs to bring in on his next visit.

## 2012-01-21 NOTE — Progress Notes (Signed)
   [facility]    OFFICE PROGRESS NOTE   INTERVAL HISTORY:   He returns as scheduled. The abdominal wound has almost completely healed. The surrounding erythema improved after he was given antibiotics. No complaint. He is scheduled to begin radiation and Xeloda on 01/23/2012.  Objective:  Vital signs in last 24 hours:  Blood pressure 139/81, pulse 65, temperature 98.5 F (36.9 C), temperature source Oral, height 5' 11.5" (1.816 m), weight 357 lb (161.934 kg).   Resp: Lungs clear bilaterally Cardio: Regular rate and rhythm GI: No hepatomegaly, nontender, there is a less than 1 cm opening at the lower aspect of the midline wound with a wick in place. Mild surrounding induration and erythema. No discharge. Vascular: Chronic stasis change at the low leg bilaterally   Lab Results:  Lab Results  Component Value Date   WBC 6.0 01/21/2012   HGB 15.5 01/21/2012   HCT 43.0 01/21/2012   MCV 85.3 01/21/2012   PLT 144 01/21/2012   ANC 4.2 PT/INR 1.7 CEA less than 0.5 on 01/15/2012  Medications: I have reviewed the patient's current medications.  Assessment/Plan: 1.Rectal cancer, stage II- (pT3,pN0), status post a low anterior resection, the tumor was noted to be at 10 cm on a rigid proctoscopy at the time of surgery  2. History of iron deficiency anemia-likely secondary to rectal bleeding -resolved 3. Deep vein thrombosis/pulmonary embolism in June of 2011 -maintained on Coumadin, the Coumadin was dose reduced when the PT/INR returned at 3.3 on 01/15/2012. He will resume Coumadin at the previous dose today. 4. Renal insufficiency -no dose adjustment needed on Xeloda per consultation with the cancer Center pharmacy 5. Hypertension  6. Healing abdominal wound-the wound has almost completely healed and there is much less surrounding induration compared to when he was here on 01/01/12.  Disposition:  The abdominal wound has almost completely healed. He is scheduled to begin  Xeloda and radiation on 01/23/2012. The PT/INR will be monitored closely while on Xeloda.  Richard Warner will return for an office visit on 02/08/2012.   Richard Papas, MD  01/21/2012  6:20 PM

## 2012-01-21 NOTE — Telephone Encounter (Signed)
gv pt appt schedule for June and July.  °

## 2012-01-22 ENCOUNTER — Ambulatory Visit
Admission: RE | Admit: 2012-01-22 | Discharge: 2012-01-22 | Disposition: A | Discharge status: Home / Self Care | Payer: Medicaid Other | Source: Ambulatory Visit | Attending: Radiation Oncology | Admitting: Radiation Oncology

## 2012-01-23 ENCOUNTER — Ambulatory Visit (INDEPENDENT_AMBULATORY_CARE_PROVIDER_SITE_OTHER): Payer: Medicaid Other | Admitting: Surgery

## 2012-01-23 ENCOUNTER — Encounter (INDEPENDENT_AMBULATORY_CARE_PROVIDER_SITE_OTHER): Payer: Self-pay | Admitting: Surgery

## 2012-01-23 ENCOUNTER — Ambulatory Visit: Payer: Medicaid Other

## 2012-01-23 VITALS — BP 143/74 | HR 78 | Temp 97.8°F | Ht 71.5 in | Wt 359.0 lb

## 2012-01-23 DIAGNOSIS — C20 Malignant neoplasm of rectum: Secondary | ICD-10-CM

## 2012-01-23 DIAGNOSIS — IMO0002 Reserved for concepts with insufficient information to code with codable children: Secondary | ICD-10-CM

## 2012-01-23 NOTE — Progress Notes (Signed)
Subjective:     Patient ID: Richard Warner, male   DOB: 1951-06-09, 61 y.o.   MRN: [id number]  HPI   Richard Warner  June 22, 1951 [id number]  Patient Care Team: [name], MD as PCP - General (Family Medicine) [name], MD as Consulting Physician (Gastroenterology) [name], MD as Consulting Physician (Pulmonary Disease) [name], MD as Consulting Physician (Cardiology) [name], MD as Consulting Physician (Medical Oncology) [name], MD as Consulting Physician (Radiation Oncology) [name], MD (General Surgery)  This patient is a 61 y.o.male who presents today for surgical evaluation.   Procedure: Low anterior rectosigmoid resection of mid rectal cancer 12/14/2011  The patient comes with his mother. Soreness minimal. Eating well. Having regular bowel movements a day. Energy good. No fevers or chills. I drained a seroma & had him take antibiotics for cellulitis.  That is completed  The drainage has gone down. He feels better.  Changing wick daily.  He was due to start out oral chemotherapy and radiation therapy today but XRT system not working, so it is delayed until later this week later this week.   Patient Active Problem List  Diagnosis  . HYPOTHYROIDISM  . DYSLIPIDEMIA  . Obesity, Class III, BMI 40-49.9 (morbid obesity)  . HYPERTENSION  . PULMONARY EMBOLISM AND INFARCTION  . RENAL DISEASE, CHRONIC, STAGE III  . DVT (deep venous thrombosis)  . Iron deficiency anemia, unspecified  . Benign neoplasm of colon  . Clotting disorder  . 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 Seroma, postoperative with possible cellulitis    Past Medical History  Diagnosis Date  . Hypertension   . Pulmonary embolism 12-03-11    01-17-10 s/p DVT left leg -Coumadin tx.until 08-28-11  . DVT (deep vein thrombosis) in pregnancy 12-03-11    6'2011-left leg  . Hyperlipidemia   . Leg swelling 12-03-11    bilateral if up most of  day,equally swells, up to knee level  . Shortness of breath 12-03-11    hx. 6'11- during pulmonary emboli episode, none now, extreme exertion only.  . Fractures 12-03-11    s/p right ankle and right wrist- no problems now  . Hypothyroidism   . Dyslipidemia   . Anemia 12-03-11    tx. oral iron supplement  . Arthritis 12-03-11    mild lower back  . Chronic kidney disease 12-03-11    kidney stone x1 ,none recent stage III  . 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 Colonoscopy 10/08/2011    Procedure: COLONOSCOPY;  Surgeon: [name], MD;  Location: [facility];  Service: Endoscopy;  Laterality: N/A;  . Tonsillectomy 12-03-11    child  . Proctoscopy 12/14/2011    Procedure: PROCTOSCOPY;  Surgeon: [name], MD;  Location: [facility];  Service: General;  Laterality: N/A;  . Low anterior bowel resection 12/14/2011    Mid/proximal rectal cancer. T3 N0    History   Social History  . Marital Status: Single    Spouse Name: N/A    Number of Children: N/A  . Years of Education: N/A   Occupational History  . MERCHANT     SELF EMPLOYED   Social History Main Topics  . Smoking status: Never Smoker   . Smokeless tobacco:  Never Used  . Alcohol Use: No  . Drug Use: No  . Sexually Active: Not on file   Other Topics Concern  . Not on file   Social History Narrative  . No narrative on file    Family History  Problem Relation Age of Onset  . Diabetes Father   . Heart disease Father   . Stroke Maternal Grandmother   . Stroke Maternal Grandfather   . Hypertension Paternal Grandfather   . Lung cancer Paternal Grandfather   . Cancer Paternal Grandfather     Current Outpatient Prescriptions  Medication Sig Dispense Refill  . aspirin EC 81 MG tablet Take 81 mg by  mouth daily.      [name] atenolol (TENORMIN) 100 MG tablet Take 100 mg by mouth every evening.       [name] doxazosin (CARDURA) 2 MG tablet Take 2 mg by mouth at bedtime.      . Ferrous Sulfate Dried 200 (65 FE) MG TABS Take 1 tab twice daily      . levothyroxine (SYNTHROID, LEVOTHROID) 25 MCG tablet Take 25 mcg by mouth daily with breakfast.       . Multiple Vitamin (MULITIVITAMIN WITH MINERALS) TABS Take 1 tablet by mouth daily.      . simvastatin (ZOCOR) 40 MG tablet Take 40 mg by mouth every evening.      . warfarin (COUMADIN) 5 MG tablet Take 5 mg by mouth daily. Or as directed         No Known Allergies  BP 143/74  Pulse 78  Temp 97.8 F (36.6 C) (Temporal)  Ht 5' 11.5" (1.816 m)  Wt 359 lb (162.841 kg)  BMI 49.37 kg/m2  SpO2 95%  No results found.   Review of Systems  Constitutional: Negative for fever, chills and diaphoresis.  HENT: Negative for sore throat, trouble swallowing and neck pain.   Eyes: Negative for photophobia and visual disturbance.  Respiratory: Negative for choking and shortness of breath.   Cardiovascular: Negative for chest pain and palpitations.  Gastrointestinal: Negative for nausea, vomiting, abdominal distention, anal bleeding and rectal pain.  Genitourinary: Negative for dysuria, urgency, difficulty urinating and testicular pain.  Musculoskeletal: Negative for myalgias, arthralgias and gait problem.  Skin: Negative for color change and rash.  Neurological: Negative for dizziness, speech difficulty, weakness and numbness.  Hematological: Negative for adenopathy.  Psychiatric/Behavioral: Negative for hallucinations, confusion and agitation.       Objective:   Physical Exam  Constitutional: He is oriented to person, place, and time. He appears well-developed and well-nourished. No distress.  HENT:  Head: Normocephalic.  Mouth/Throat: Oropharynx is clear and moist. No oropharyngeal exudate.  Eyes: Conjunctivae and EOM are normal. Pupils are equal,  round, and reactive to light. No scleral icterus.  Neck: Normal range of motion. No tracheal deviation present.  Cardiovascular: Normal rate, normal heart sounds and intact distal pulses.   Pulmonary/Chest: Effort normal. No respiratory distress.  Abdominal: Soft. He exhibits no distension. There is no tenderness. Hernia confirmed negative in the right inguinal area and confirmed negative in the left inguinal area.         Incisions clean with normal healing ridges.  No hernias  Musculoskeletal: Normal range of motion. He exhibits no tenderness.  Neurological: He is alert and oriented to person, place, and time. No cranial nerve deficit. He exhibits normal muscle tone. Coordination normal.  Skin: Skin is warm and dry. No rash noted. He is not diaphoretic.  Psychiatric: He has a  normal mood and affect. His behavior is normal.       Assessment:     T3N0 rectal cancer  Post-op seroma, s/p open drainage, healing    Plan:     -wick in wound change daily.  -RTC to see me in 2 weeks  -postadj oral Xeloda & XRT per Med/Rad Onc - OK to start since  wound more superficial now  -Increase activity as tolerated.  Do not push through pain.  Advanced on diet as tolerated. Bowel regimen to avoid problems.  -Pt & mother expressed understanding and appreciation

## 2012-01-24 ENCOUNTER — Ambulatory Visit: Payer: Medicaid Other

## 2012-01-24 ENCOUNTER — Telehealth (INDEPENDENT_AMBULATORY_CARE_PROVIDER_SITE_OTHER): Payer: Self-pay

## 2012-01-24 NOTE — Telephone Encounter (Signed)
LMOM at home stating that we made him a 2wk f/u appt with Dr Michaell Cowing.

## 2012-01-25 ENCOUNTER — Ambulatory Visit: Payer: Medicaid Other

## 2012-01-28 ENCOUNTER — Encounter: Payer: Self-pay | Admitting: *Deleted

## 2012-01-28 ENCOUNTER — Telehealth: Payer: Self-pay | Admitting: *Deleted

## 2012-01-28 ENCOUNTER — Ambulatory Visit: Admission: RE | Admit: 2012-01-28 | Payer: Medicaid Other | Source: Ambulatory Visit

## 2012-01-28 NOTE — Telephone Encounter (Signed)
Patient received Xeloda from drug company, but did not receive pill box or sample of Udder Cream. Gave him our office sample pack and made him aware that cream is OTC. He took 1 dose of Xeloda today and radiation was held today, due to delay in wound healing. Radiation/chemo is now on hold until oncologist decides next step. He reports RT will call him tomorrow.  He is asking if he still has to have PT/INR checked on 6/25 since he is not on treatment yet?

## 2012-01-29 ENCOUNTER — Ambulatory Visit: Payer: Medicaid Other

## 2012-01-29 ENCOUNTER — Other Ambulatory Visit: Payer: Self-pay | Admitting: Lab

## 2012-01-30 ENCOUNTER — Ambulatory Visit: Payer: Medicaid Other

## 2012-01-31 ENCOUNTER — Ambulatory Visit
Admission: RE | Admit: 2012-01-31 | Discharge: 2012-01-31 | Disposition: A | Discharge status: Home / Self Care | Payer: Medicaid Other | Source: Ambulatory Visit | Attending: Radiation Oncology | Admitting: Radiation Oncology

## 2012-01-31 ENCOUNTER — Telehealth: Payer: Self-pay | Admitting: Nurse Practitioner

## 2012-01-31 VITALS — BP 145/74 | HR 60 | Temp 97.4°F | Wt 355.6 lb

## 2012-01-31 DIAGNOSIS — C20 Malignant neoplasm of rectum: Secondary | ICD-10-CM

## 2012-01-31 NOTE — Progress Notes (Signed)
   Department of Radiation Oncology  Phone:  [phone] Fax:        [phone]  Weekly Treatment Note    Name: Richard Warner Date: 01/31/2012 MRN: [id number] DOB: 17-Jan-1951   Current dose: 1.8 Gy  Current fraction: 1   MEDICATIONS: Current Outpatient Prescriptions  Medication Sig Dispense Refill  . aspirin EC 81 MG tablet Take 81 mg by mouth daily.      [name] atenolol (TENORMIN) 100 MG tablet Take 100 mg by mouth every evening.       . capecitabine (XELODA) 500 MG tablet Take 2,000 mg by mouth 2 (two) times daily after a meal. Take twice daily on M-F (days of radiation only) #240--received thru [employer] PAP      . doxazosin (CARDURA) 2 MG tablet Take 2 mg by mouth at bedtime.      . Ferrous Sulfate Dried 200 (65 FE) MG TABS Take 1 tab twice daily      . levothyroxine (SYNTHROID, LEVOTHROID) 25 MCG tablet Take 25 mcg by mouth daily with breakfast.       . Multiple Vitamin (MULITIVITAMIN WITH MINERALS) TABS Take 1 tablet by mouth daily.      . simvastatin (ZOCOR) 40 MG tablet Take 40 mg by mouth every evening.      . warfarin (COUMADIN) 5 MG tablet Take 5 mg by mouth daily. Or as directed         ALLERGIES: Review of patient's allergies indicates no known allergies.   LABORATORY DATA:  Lab Results  Component Value Date   WBC 6.0 01/21/2012   HGB 15.5 01/21/2012   HCT 43.0 01/21/2012   MCV 85.3 01/21/2012   PLT 144 01/21/2012   Lab Results  Component Value Date   NA 141 01/15/2012   K 4.4 01/15/2012   CL 106 01/15/2012   CO2 22 01/15/2012   Lab Results  Component Value Date   ALT 28 01/15/2012   AST 21 01/15/2012   ALKPHOS 64 01/15/2012   BILITOT 0.5 01/15/2012     NARRATIVE: Richard Warner was seen today for weekly treatment management. The chart was checked and the patient's films were reviewed. The patient has begun his treatment. He did fine with his treatment. It has been altered to a better of folate the surgical scar.  PHYSICAL EXAMINATION: vitals were  not taken for this visit.     patient is alert, in no acute distress.  ASSESSMENT: The patient is doing satisfactorily with treatment.  PLAN: We will continue with the patient's radiation treatment as planned.

## 2012-01-31 NOTE — Telephone Encounter (Signed)
Spoke with patient- 1st radiation treatment today.  Scheduled pt for PT/INR tomorrow prior to radiation appt.

## 2012-01-31 NOTE — Progress Notes (Signed)
Post simulation education completed.  Addressed daily schedule and given a copy of his radiation therapy and chemotherapy schedule combined.  Informed that Dr. Mitzi Hansen will see him on every Thursday after treatment, but if this changes he will be notified prior to the change.  Side-effect management included management of skin in the pelvic area with instructions to not use any creams lotions or powders in the pelvic region outside of what is recommended by Dr. Mitzi Hansen. He was informed that he may experience hair loss in the pelvic area too. Reviewed management of fatigue, nausea/vomiting, diarrhea, dysuria.  Recommended he obtain Imodium OTC as recommended by our physicians, and Dove sensitive skin soap.  Wife present during treatment.  Richard Warner has started Xeloda and he was given gloves to use when handling this medication.

## 2012-01-31 NOTE — Progress Notes (Signed)
First treatment today.  Post sim Education completed.  No voiced complaints.

## 2012-02-01 ENCOUNTER — Other Ambulatory Visit (HOSPITAL_BASED_OUTPATIENT_CLINIC_OR_DEPARTMENT_OTHER): Payer: Medicaid Other | Admitting: Lab

## 2012-02-01 ENCOUNTER — Ambulatory Visit
Admission: RE | Admit: 2012-02-01 | Discharge: 2012-02-01 | Disposition: A | Discharge status: Home / Self Care | Payer: Medicaid Other | Source: Ambulatory Visit | Attending: Radiation Oncology | Admitting: Radiation Oncology

## 2012-02-01 ENCOUNTER — Telehealth: Payer: Self-pay | Admitting: *Deleted

## 2012-02-01 DIAGNOSIS — I82409 Acute embolism and thrombosis of unspecified deep veins of unspecified lower extremity: Secondary | ICD-10-CM

## 2012-02-01 LAB — PROTIME-INR
INR: 2.6 (ref 2.00–3.50)
Protime: 31.2 Seconds — ABNORMAL HIGH (ref 10.6–13.4)

## 2012-02-04 ENCOUNTER — Other Ambulatory Visit: Payer: Medicaid Other | Admitting: Lab

## 2012-02-04 ENCOUNTER — Encounter (INDEPENDENT_AMBULATORY_CARE_PROVIDER_SITE_OTHER): Payer: Self-pay | Admitting: Surgery

## 2012-02-04 ENCOUNTER — Ambulatory Visit (INDEPENDENT_AMBULATORY_CARE_PROVIDER_SITE_OTHER): Payer: Medicaid Other | Admitting: Surgery

## 2012-02-04 ENCOUNTER — Ambulatory Visit
Admission: RE | Admit: 2012-02-04 | Discharge: 2012-02-04 | Disposition: A | Discharge status: Home / Self Care | Payer: Medicaid Other | Source: Ambulatory Visit | Attending: Radiation Oncology | Admitting: Radiation Oncology

## 2012-02-04 VITALS — BP 162/84 | HR 78 | Temp 97.6°F | Ht 71.5 in | Wt 356.8 lb

## 2012-02-04 DIAGNOSIS — C20 Malignant neoplasm of rectum: Secondary | ICD-10-CM

## 2012-02-04 DIAGNOSIS — I82409 Acute embolism and thrombosis of unspecified deep veins of unspecified lower extremity: Secondary | ICD-10-CM

## 2012-02-04 DIAGNOSIS — IMO0002 Reserved for concepts with insufficient information to code with codable children: Secondary | ICD-10-CM

## 2012-02-04 LAB — PROTIME-INR: INR: 2.4 (ref 2.00–3.50)

## 2012-02-04 NOTE — Progress Notes (Signed)
Subjective:     Patient ID: Richard Warner, male   DOB: 1951-07-05, 61 y.o.   MRN: [id number]  HPI   Richard Warner  1950-10-07 [id number]  Patient Care Team: [name], MD as PCP - General (Family Medicine) [name], MD as Consulting Physician (Gastroenterology) [name], MD as Consulting Physician (Pulmonary Disease) [name], MD as Consulting Physician (Cardiology) [name], MD as Consulting Physician (Medical Oncology) [name], MD as Consulting Physician (Radiation Oncology) [name], MD (General Surgery)  This patient is a 61 y.o.male who presents today for surgical evaluation.   Procedure: Low anterior rectosigmoid resection of mid rectal cancer 12/14/2011  Pathology:  FINAL DIAGNOSIS Diagnosis Rectum, resection, mid - INVASIVE ADENOCARCINOMA, INVADING THROUGH THE MUSCULARIS PROPRIA INTO PERICOLONIC FATTY TISSUE. - NO EVIDENCE OF ANGIOLYMPHATIC INVASION IDENTIFIED. - TWELVE PERICOLONIC LYMPH NODES, NEGATIVE FOR METASTATIC CARCINOMA (0/12). - RESECTION MARGINS ARE NEGATIVE FOR ATYPIA OR MALIGNANCY.  The patient comes in by himself. Soreness minimal. Eating well for 2 meals. Having regular bowel movements a day. Energy good. No fevers or chills. I drained a seroma & had him take antibiotics for cellulitis.  That is completed.  Golfed the other day.  The drainage has gone down. He feels better.  Changing wick 1x/week.  He is in the middle of oral chemotherapy and radiation therapy.    Patient Active Problem List  Diagnosis  . HYPOTHYROIDISM  . DYSLIPIDEMIA  . Obesity, Class III, BMI 40-49.9 (morbid obesity)  . HYPERTENSION  . PULMONARY EMBOLISM AND INFARCTION  . RENAL DISEASE, CHRONIC, STAGE III  . DVT (deep venous thrombosis)  . Iron deficiency anemia, unspecified  . Benign neoplasm of colon  . Clotting disorder  . 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 Seroma, postoperative    Past Medical  History  Diagnosis Date  . Hypertension   . Pulmonary embolism 12-03-11    01-17-10 s/p DVT left leg -Coumadin tx.until 08-28-11  . DVT (deep vein thrombosis) in pregnancy 12-03-11    6'2011-left leg  . Hyperlipidemia   . Leg swelling 12-03-11    bilateral if up most of day,equally swells, up to knee level  . Shortness of breath 12-03-11    hx. 6'11- during pulmonary emboli episode, none now, extreme exertion only.  . Fractures 12-03-11    s/p right ankle and right wrist- no problems now  . Hypothyroidism   . Dyslipidemia   . Anemia 12-03-11    tx. oral iron supplement  . Arthritis 12-03-11    mild lower back  . Chronic kidney disease 12-03-11    kidney stone x1 ,none recent stage III  . 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 Colonoscopy 10/08/2011    Procedure: COLONOSCOPY;  Surgeon: [name], MD;  Location: [facility];  Service: Endoscopy;  Laterality: N/A;  . Tonsillectomy 12-03-11    child  . Proctoscopy 12/14/2011    Procedure: PROCTOSCOPY;  Surgeon: [name], MD;  Location: [facility];  Service: General;  Laterality: N/A;  . Low anterior bowel resection 12/14/2011    Mid/proximal rectal cancer. T3 N0    History   Social History  . Marital Status: Single    Spouse Name: N/A    Number of Children: N/A  .  Years of Education: N/A   Occupational History  . MERCHANT     SELF EMPLOYED   Social History Main Topics  . Smoking status: Never Smoker   . Smokeless tobacco: Never Used  . Alcohol Use: No  . Drug Use: No  . Sexually Active: Not on file   Other Topics Concern  . Not on file   Social History Narrative  . No narrative on file    Family History  Problem Relation Age of Onset  . Diabetes Father   . Heart disease Father   . Stroke  Maternal Grandmother   . Stroke Maternal Grandfather   . Hypertension Paternal Grandfather   . Lung cancer Paternal Grandfather   . Cancer Paternal Grandfather     Current Outpatient Prescriptions  Medication Sig Dispense Refill  . aspirin EC 81 MG tablet Take 81 mg by mouth daily.      [name] atenolol (TENORMIN) 100 MG tablet Take 100 mg by mouth every evening.       . capecitabine (XELODA) 500 MG tablet Take 2,000 mg by mouth 2 (two) times daily after a meal. Take twice daily on M-F (days of radiation only) #240--received thru [employer] P[facility]      . doxazosin (CARDURA) 2 MG tablet Take 2 mg by mouth at bedtime.      . Ferrous Sulfate Dried 200 (65 FE) MG TABS Take 1 tab twice daily      . levothyroxine (SYNTHROID, LEVOTHROID) 25 MCG tablet Take 25 mcg by mouth daily with breakfast.       . Multiple Vitamin (MULITIVITAMIN WITH MINERALS) TABS Take 1 tablet by mouth daily.      . simvastatin (ZOCOR) 40 MG tablet Take 40 mg by mouth every evening.      . warfarin (COUMADIN) 5 MG tablet Take 5 mg by mouth daily. Or as directed         No Known Allergies  BP 162/84  Pulse 78  Temp 97.6 F (36.4 C) (Temporal)  Ht 5' 11.5" (1.816 m)  Wt 356 lb 12.8 oz (161.843 kg)  BMI 49.07 kg/m2  SpO2 96%  No results found.   Review of Systems  Constitutional: Negative for fever, chills and diaphoresis.  HENT: Negative for sore throat, trouble swallowing and neck pain.   Eyes: Negative for photophobia and visual disturbance.  Respiratory: Negative for choking and shortness of breath.   Cardiovascular: Negative for chest pain and palpitations.  Gastrointestinal: Negative for nausea, vomiting, abdominal distention, anal bleeding and rectal pain.  Genitourinary: Negative for dysuria, urgency, difficulty urinating and testicular pain.  Musculoskeletal: Negative for myalgias, arthralgias and gait problem.  Skin: Negative for color change and rash.  Neurological: Negative for dizziness, speech difficulty,  weakness and numbness.  Hematological: Negative for adenopathy.  Psychiatric/Behavioral: Negative for hallucinations, confusion and agitation.       Objective:   Physical Exam  Constitutional: He is oriented to person, place, and time. He appears well-developed and well-nourished. No distress.  HENT:  Head: Normocephalic.  Mouth/Throat: Oropharynx is clear and moist. No oropharyngeal exudate.  Eyes: Conjunctivae and EOM are normal. Pupils are equal, round, and reactive to light. No scleral icterus.  Neck: Normal range of motion. No tracheal deviation present.  Cardiovascular: Normal rate, normal heart sounds and intact distal pulses.   Pulmonary/Chest: Effort normal. No respiratory distress.  Abdominal: Soft. He exhibits no distension. There is no tenderness. Hernia confirmed negative in the right inguinal area and confirmed negative in the  left inguinal area.         Incisions clean with normal healing ridges.  No hernias  Musculoskeletal: Normal range of motion. He exhibits no tenderness.  Neurological: He is alert and oriented to person, place, and time. No cranial nerve deficit. He exhibits normal muscle tone. Coordination normal.  Skin: Skin is warm and dry. No rash noted. He is not diaphoretic.  Psychiatric: He has a normal mood and affect. His behavior is normal.       Assessment:     T3N0 rectal cancer  Post-op seroma, s/p open drainage, healing    Plan:     -wick in wound change daily.  -RTC to see me q2 weeks until wound closed  -postadj oral Xeloda & XRT per Med/Rad Onc   -Increase activity as tolerated.  Do not push through pain.  Advanced on diet as tolerated. Bowel regimen to avoid problems.  -Pt expressed understanding and appreciation

## 2012-02-04 NOTE — Patient Instructions (Signed)
WOUND CARE  It is important that the wound be kept open.   -Keeping the skin edges apart will allow the wound to gradually heal from the base upwards.   - If the skin edges of the wound close too early, a new fluid pocket can form and infection can occur. -This is the reason to pack deeper wounds with gauze or ribbon -This is why drained wounds cannot be sewed closed right away  A healthy wound should form a lining of bright red "beefy" granulating tissue that will help shrink the wound and help the edges grow new skin into it.   -A little mucus / yellow discharge is normal (the body's natural way to try and form a scab) and should be gently washed off with soap and water with daily dressing changes.  -Green or foul smelling drainage implies bacterial colonization and can slow wound healing - a short course of antibiotic ointment (3-5 days) can help it clear up.  Call the doctor if it does not improve or worsens  -Avoid use of antibiotic ointments for more than a week as they can slow wound healing over time.    -Sometimes other wound care products will be used to reduce need for dressing changes and/or help clean up dirty wounds -Sometimes the surgeon needs to debride the wound in the office to remove dead or infected tissue out of the wound so it can heal more quickly and safely.    Change the dressing at least once a day -Wash the wound with mild soap and water gently every day.  It is good to shower or bathe the wound to help it clean out. -Use clean 4x4 gauze for medium/large wounds or ribbon plain NU-gauze for smaller wounds (it does not need to be sterile, just clean) -Keep the skin dry around the wound to prevent breakdown and irritation. -Pack the wound down to the base -The goal is to keep the skin apart, not overpack the wound -Use a Q-tip or blunt-tipped kabob stick toothpick to push the gauze down to the base in narrow or deep wounds   -Cover with a clean gauze and tape -paper or  Medipore tape tend to be gentle on the skin -rotate the orientation of the tape to avoid repeated stress/trauma on the skin -using an ACE or Coban wrap on wounds on arms or legs can be used instead.  Returning the see the surgeon is helpful to follow the healing process and help the wound close as fast as possible.

## 2012-02-05 ENCOUNTER — Ambulatory Visit
Admission: RE | Admit: 2012-02-05 | Discharge: 2012-02-05 | Disposition: A | Discharge status: Home / Self Care | Payer: Medicaid Other | Source: Ambulatory Visit | Attending: Radiation Oncology | Admitting: Radiation Oncology

## 2012-02-06 ENCOUNTER — Ambulatory Visit
Admission: RE | Admit: 2012-02-06 | Discharge: 2012-02-06 | Disposition: A | Discharge status: Home / Self Care | Payer: Medicaid Other | Source: Ambulatory Visit | Attending: Radiation Oncology | Admitting: Radiation Oncology

## 2012-02-08 ENCOUNTER — Ambulatory Visit (HOSPITAL_BASED_OUTPATIENT_CLINIC_OR_DEPARTMENT_OTHER): Payer: Medicaid Other | Admitting: Oncology

## 2012-02-08 ENCOUNTER — Other Ambulatory Visit (HOSPITAL_BASED_OUTPATIENT_CLINIC_OR_DEPARTMENT_OTHER): Payer: Medicaid Other | Admitting: Lab

## 2012-02-08 ENCOUNTER — Telehealth: Payer: Self-pay | Admitting: Oncology

## 2012-02-08 ENCOUNTER — Ambulatory Visit
Admission: RE | Admit: 2012-02-08 | Discharge: 2012-02-08 | Disposition: A | Discharge status: Home / Self Care | Payer: Medicaid Other | Source: Ambulatory Visit | Attending: Radiation Oncology | Admitting: Radiation Oncology

## 2012-02-08 VITALS — BP 143/73 | HR 65 | Temp 97.1°F | Ht 71.5 in | Wt 357.8 lb

## 2012-02-08 DIAGNOSIS — C20 Malignant neoplasm of rectum: Secondary | ICD-10-CM

## 2012-02-08 DIAGNOSIS — Z5181 Encounter for therapeutic drug level monitoring: Secondary | ICD-10-CM

## 2012-02-08 DIAGNOSIS — I82409 Acute embolism and thrombosis of unspecified deep veins of unspecified lower extremity: Secondary | ICD-10-CM

## 2012-02-08 LAB — CBC WITH DIFFERENTIAL/PLATELET
Basophils Absolute: 0 10*3/uL (ref 0.0–0.1)
EOS%: 2 % (ref 0.0–7.0)
HGB: 14.9 g/dL (ref 13.0–17.1)
MCH: 31.2 pg (ref 27.2–33.4)
MONO#: 0.3 10*3/uL (ref 0.1–0.9)
NEUT#: 3 10*3/uL (ref 1.5–6.5)
RDW: 13.5 % (ref 11.0–14.6)
WBC: 4.1 10*3/uL (ref 4.0–10.3)
lymph#: 0.7 10*3/uL — ABNORMAL LOW (ref 0.9–3.3)

## 2012-02-08 LAB — PROTIME-INR
INR: 2.7 (ref 2.00–3.50)
Protime: 32.4 Seconds — ABNORMAL HIGH (ref 10.6–13.4)

## 2012-02-08 MED ORDER — PROCHLORPERAZINE MALEATE 10 MG PO TABS: 10.0000 mg | ORAL_TABLET | Freq: Four times a day (QID) | ORAL | Status: DC | PRN

## 2012-02-08 NOTE — Progress Notes (Signed)
   [facility]    OFFICE PROGRESS NOTE   INTERVAL HISTORY:   He returns as scheduled. He continues radiation and Xeloda. No mouth sores, nausea, diarrhea, or hand/foot pain. The abdominal wound has almost completely healed.  Objective:  Vital signs in last 24 hours:  Blood pressure 143/73, pulse 65, temperature 97.1 F (36.2 C), temperature source Oral, height 5' 11.5" (1.816 m), weight 357 lb 12.8 oz (162.297 kg).    HEENT: No thrush or ulcers Resp: Lungs clear bilaterally Cardio: Regular rate and rhythm GI: No hepatomegaly, tiny opening at the inferior aspect of the abdominal scar, mild surrounding erythema and a tape distribution Vascular: Chronic stasis change at the low leg bilaterally  Skin: Palms and soles without erythema     Lab Results:  Lab Results  Component Value Date   WBC 4.1 02/08/2012   HGB 14.9 02/08/2012   HCT 41.5 02/08/2012   MCV 86.8 02/08/2012   PLT 139* 02/08/2012   ANC 3.0  PT/INR 2.7  Medications: I have reviewed the patient's current medications.  Assessment/Plan: 1.Rectal cancer, stage II- (pT3,pN0), status post a low anterior resection, the tumor was noted to be at 10 cm on a rigid proctoscopy at the time of surgery. He began adjuvant Xeloda and radiation on 01/31/2012. 2. History of iron deficiency anemia-likely secondary to rectal bleeding -resolved  3. Deep vein thrombosis/pulmonary embolism in June of 2011 -maintained on Coumadin, the Coumadin was dose reduced when the PT/INR returned at 3.3 on 01/15/2012. The PT/INR is therapeutic today. 4. Renal insufficiency -no dose adjustment needed on Xeloda per consultation with the cancer Center pharmacy  5. Hypertension  6. Healing abdominal wound-the wound has almost completely healed.    Disposition:  He appears to be tolerating the chemotherapy and radiation well. He will return for a weekly CBC and PT check. Mr. Sites is scheduled for an office visit in 2  weeks.   Thornton Papas, MD  02/08/2012  5:09 PM

## 2012-02-08 NOTE — Progress Notes (Signed)
Patient alert,oriented x3, regular bowel movements stated,no c/ nausea, rx for nausea given today by med onc, gave patient a sitz bath with instructions of use prn, patient gave teach back understanding how to use No c/o pain at present time, completed 6/28 rad txs, no fatigue, saw Dr. Truett Perna today and lab work done 11:21 AM

## 2012-02-08 NOTE — Telephone Encounter (Signed)
gv pt appt schedule for July.  

## 2012-02-08 NOTE — Progress Notes (Signed)
   Department of Radiation Oncology  Phone:  [phone] Fax:        [phone]  Weekly Treatment Note    Name: Richard Warner Date: 02/08/2012 MRN: [id number] DOB: 1951-01-21   Current dose: 10.8 Gy  Current fraction: 6   MEDICATIONS: Current Outpatient Prescriptions  Medication Sig Dispense Refill  . aspirin EC 81 MG tablet Take 81 mg by mouth daily.      [name] atenolol (TENORMIN) 100 MG tablet Take 100 mg by mouth every evening.       . capecitabine (XELODA) 500 MG tablet Take 2,000 mg by mouth 2 (two) times daily after a meal. Take twice daily on M-F (days of radiation only) #240--received thru [facility]      . doxazosin (CARDURA) 2 MG tablet Take 2 mg by mouth at bedtime.      . Ferrous Sulfate Dried 200 (65 FE) MG TABS Take 1 tab twice daily      . levothyroxine (SYNTHROID, LEVOTHROID) 25 MCG tablet Take 25 mcg by mouth daily with breakfast.       . Multiple Vitamin (MULITIVITAMIN WITH MINERALS) TABS Take 1 tablet by mouth daily.      . prochlorperazine (COMPAZINE) 10 MG tablet Take 1 tablet (10 mg total) by mouth every 6 (six) hours as needed (nausea).  30 tablet  0  . simvastatin (ZOCOR) 40 MG tablet Take 40 mg by mouth every evening.      . warfarin (COUMADIN) 5 MG tablet Take 5 mg by mouth daily. Or as directed         ALLERGIES: Review of patient's allergies indicates no known allergies.   LABORATORY DATA:  Lab Results  Component Value Date   WBC 4.1 02/08/2012   HGB 14.9 02/08/2012   HCT 41.5 02/08/2012   MCV 86.8 02/08/2012   PLT 139* 02/08/2012   Lab Results  Component Value Date   NA 141 01/15/2012   K 4.4 01/15/2012   CL 106 01/15/2012   CO2 22 01/15/2012   Lab Results  Component Value Date   ALT 28 01/15/2012   AST 21 01/15/2012   ALKPHOS 64 01/15/2012   BILITOT 0.5 01/15/2012     NARRATIVE: Richard Warner was seen today for weekly treatment management. The chart was checked and the patient's films were reviewed. The patient has done well thus far.  No significant skin irritation in the anal region. Regular bowel movements. No nausea.  PHYSICAL EXAMINATION: vitals were not taken for this visit.     alert and oriented, no distress  ASSESSMENT: The patient is doing satisfactorily with treatment.  PLAN: We will continue with the patient's radiation treatment as planned. The patient has been given a sitz bath for him to use as needed as well.

## 2012-02-11 ENCOUNTER — Ambulatory Visit
Admission: RE | Admit: 2012-02-11 | Discharge: 2012-02-11 | Disposition: A | Discharge status: Home / Self Care | Payer: Medicaid Other | Source: Ambulatory Visit | Attending: Radiation Oncology | Admitting: Radiation Oncology

## 2012-02-12 ENCOUNTER — Ambulatory Visit
Admission: RE | Admit: 2012-02-12 | Discharge: 2012-02-12 | Disposition: A | Discharge status: Home / Self Care | Payer: Medicaid Other | Source: Ambulatory Visit | Attending: Radiation Oncology | Admitting: Radiation Oncology

## 2012-02-13 ENCOUNTER — Ambulatory Visit
Admission: RE | Admit: 2012-02-13 | Discharge: 2012-02-13 | Disposition: A | Discharge status: Home / Self Care | Payer: Medicaid Other | Source: Ambulatory Visit | Attending: Radiation Oncology | Admitting: Radiation Oncology

## 2012-02-14 ENCOUNTER — Ambulatory Visit
Admission: RE | Admit: 2012-02-14 | Discharge: 2012-02-14 | Disposition: A | Discharge status: Home / Self Care | Payer: Medicaid Other | Source: Ambulatory Visit | Attending: Radiation Oncology | Admitting: Radiation Oncology

## 2012-02-15 ENCOUNTER — Ambulatory Visit
Admission: RE | Admit: 2012-02-15 | Discharge: 2012-02-15 | Disposition: A | Discharge status: Home / Self Care | Payer: Medicaid Other | Source: Ambulatory Visit | Attending: Radiation Oncology | Admitting: Radiation Oncology

## 2012-02-15 ENCOUNTER — Encounter: Payer: Self-pay | Admitting: Radiation Oncology

## 2012-02-15 VITALS — BP 143/77 | HR 79 | Temp 96.1°F | Resp 20 | Wt 360.6 lb

## 2012-02-15 DIAGNOSIS — C20 Malignant neoplasm of rectum: Secondary | ICD-10-CM

## 2012-02-15 NOTE — Progress Notes (Signed)
Patient alert,oriented x3,steqdy gait, no c/o pain, diarrhea, no fatigue,  Completed 11/28 rad txs 11:32 AM

## 2012-02-15 NOTE — Progress Notes (Signed)
   Weekly Management Note, rectal cancer Current Dose:  19.8 Gy  Projected Dose:  50.4 Gy   Narrative:  The patient presents for routine under treatment assessment.  CBCT/MVCT images/Port film x-rays were reviewed.  The chart was checked. No complaints. He denies nausea vomiting and diarrhea. He denies any sores in his mouth. He is taking Xeloda. No abdominal pain.  Physical Findings:  weight is 360 lb 9.6 oz (163.567 kg). His oral temperature is 96.1 F (35.6 C). His blood pressure is 143/77 and his pulse is 79. His respiration is 20.  no oropharyngeal sores. No tenderness to palpation throughout the abdomen.  CBC    Component Value Date/Time   WBC 4.1 02/08/2012 0927   WBC 8.0 12/16/2011 0526   RBC 4.78 02/08/2012 0927   RBC 4.63 12/16/2011 0526   HGB 14.9 02/08/2012 0927   HGB 13.3 12/16/2011 0526   HCT 41.5 02/08/2012 0927   HCT 39.7 12/16/2011 0526   PLT 139* 02/08/2012 0927   PLT 136* 12/16/2011 0526   MCV 86.8 02/08/2012 0927   MCV 85.7 12/16/2011 0526   MCH 31.2 02/08/2012 0927   MCH 28.7 12/16/2011 0526   MCHC 35.9 02/08/2012 0927   MCHC 33.5 12/16/2011 0526   RDW 13.5 02/08/2012 0927   RDW 17.0* 12/16/2011 0526   LYMPHSABS 0.7* 02/08/2012 0927   LYMPHSABS 1.1 09/12/2011 1655   MONOABS 0.3 02/08/2012 0927   MONOABS 0.7 09/12/2011 1655   EOSABS 0.1 02/08/2012 0927   EOSABS 0.1 09/12/2011 1655   BASOSABS 0.0 02/08/2012 0927   BASOSABS 0.0 09/12/2011 1655    CMP     Component Value Date/Time   NA 141 01/15/2012 1119   K 4.4 01/15/2012 1119   CL 106 01/15/2012 1119   CO2 22 01/15/2012 1119   GLUCOSE 102* 01/15/2012 1119   BUN 22 01/15/2012 1119   CREATININE 1.46* 01/15/2012 1119   CALCIUM 9.4 01/15/2012 1119   PROT 6.4 01/15/2012 1119   ALBUMIN 4.2 01/15/2012 1119   AST 21 01/15/2012 1119   ALT 28 01/15/2012 1119   ALKPHOS 64 01/15/2012 1119   BILITOT 0.5 01/15/2012 1119   GFRNONAA 46* 12/15/2011 0503   GFRAA 54* 12/15/2011 0503      Impression:  The patient is tolerating radiotherapy.  Plan:  Continue  radiotherapy as planned.  ________________________________   Lonie Peak, M.D.

## 2012-02-18 ENCOUNTER — Ambulatory Visit
Admission: RE | Admit: 2012-02-18 | Discharge: 2012-02-18 | Disposition: A | Discharge status: Home / Self Care | Payer: Medicaid Other | Source: Ambulatory Visit | Attending: Radiation Oncology | Admitting: Radiation Oncology

## 2012-02-19 ENCOUNTER — Ambulatory Visit: Payer: Medicaid Other

## 2012-02-19 ENCOUNTER — Ambulatory Visit
Admission: RE | Admit: 2012-02-19 | Discharge: 2012-02-19 | Disposition: A | Discharge status: Home / Self Care | Payer: Medicaid Other | Source: Ambulatory Visit | Attending: Radiation Oncology | Admitting: Radiation Oncology

## 2012-02-20 ENCOUNTER — Ambulatory Visit
Admission: RE | Admit: 2012-02-20 | Discharge: 2012-02-20 | Disposition: A | Discharge status: Home / Self Care | Payer: Medicaid Other | Source: Ambulatory Visit | Attending: Radiation Oncology | Admitting: Radiation Oncology

## 2012-02-21 ENCOUNTER — Ambulatory Visit
Admission: RE | Admit: 2012-02-21 | Discharge: 2012-02-21 | Disposition: A | Discharge status: Home / Self Care | Payer: Medicaid Other | Source: Ambulatory Visit | Attending: Radiation Oncology | Admitting: Radiation Oncology

## 2012-02-22 ENCOUNTER — Ambulatory Visit
Admission: RE | Admit: 2012-02-22 | Discharge: 2012-02-22 | Disposition: A | Discharge status: Home / Self Care | Payer: Medicaid Other | Source: Ambulatory Visit | Attending: Radiation Oncology | Admitting: Radiation Oncology

## 2012-02-22 ENCOUNTER — Other Ambulatory Visit (HOSPITAL_BASED_OUTPATIENT_CLINIC_OR_DEPARTMENT_OTHER): Payer: Medicaid Other | Admitting: Lab

## 2012-02-22 ENCOUNTER — Encounter: Payer: Self-pay | Admitting: Radiation Oncology

## 2012-02-22 ENCOUNTER — Ambulatory Visit (HOSPITAL_BASED_OUTPATIENT_CLINIC_OR_DEPARTMENT_OTHER): Payer: Medicaid Other | Admitting: Nurse Practitioner

## 2012-02-22 ENCOUNTER — Telehealth: Payer: Self-pay | Admitting: Oncology

## 2012-02-22 VITALS — BP 133/61 | HR 63 | Temp 98.0°F | Ht 71.5 in | Wt 352.8 lb

## 2012-02-22 VITALS — BP 132/82 | HR 77 | Temp 98.0°F | Resp 20 | Wt 352.5 lb

## 2012-02-22 DIAGNOSIS — C20 Malignant neoplasm of rectum: Secondary | ICD-10-CM

## 2012-02-22 DIAGNOSIS — Z5181 Encounter for therapeutic drug level monitoring: Secondary | ICD-10-CM

## 2012-02-22 LAB — PROTIME-INR
INR: 1.9 — ABNORMAL LOW (ref 2.00–3.50)
Protime: 22.8 Seconds — ABNORMAL HIGH (ref 10.6–13.4)

## 2012-02-22 LAB — CBC WITH DIFFERENTIAL/PLATELET
EOS%: 2.3 % (ref 0.0–7.0)
Eosinophils Absolute: 0.1 10*3/uL (ref 0.0–0.5)
MCH: 31.8 pg (ref 27.2–33.4)
MCV: 93.5 fL (ref 79.3–98.0)
MONO%: 11.1 % (ref 0.0–14.0)
NEUT#: 3.7 10*3/uL (ref 1.5–6.5)
RBC: 4.73 10*6/uL (ref 4.20–5.82)
RDW: 14.6 % (ref 11.0–14.6)

## 2012-02-22 LAB — COMPREHENSIVE METABOLIC PANEL
Albumin: 4.1 g/dL (ref 3.5–5.2)
BUN: 18 mg/dL (ref 6–23)
Calcium: 9.2 mg/dL (ref 8.4–10.5)
Chloride: 105 mEq/L (ref 96–112)
Glucose, Bld: 91 mg/dL (ref 70–99)
Potassium: 4.2 mEq/L (ref 3.5–5.3)

## 2012-02-22 NOTE — Progress Notes (Signed)
   Department of Radiation Oncology  Phone:  [phone] Fax:        [phone]  Weekly Treatment Note    Name: Richard Warner Date: 02/22/2012 MRN: [id number] DOB: Oct 09, 1950   Current dose: 28.8 Gy  Current fraction: 16   MEDICATIONS: Current Outpatient Prescriptions  Medication Sig Dispense Refill  . aspirin EC 81 MG tablet Take 81 mg by mouth daily.      [name] atenolol (TENORMIN) 100 MG tablet Take 100 mg by mouth every evening.       . capecitabine (XELODA) 500 MG tablet Take 2,000 mg by mouth 2 (two) times daily after a meal. Take twice daily on M-F (days of radiation only) #240--received thru [facility]      . doxazosin (CARDURA) 2 MG tablet Take 2 mg by mouth at bedtime.      . Ferrous Sulfate Dried 200 (65 FE) MG TABS Take 1 tab twice daily      . levothyroxine (SYNTHROID, LEVOTHROID) 25 MCG tablet Take 25 mcg by mouth daily with breakfast.       . Multiple Vitamin (MULITIVITAMIN WITH MINERALS) TABS Take 1 tablet by mouth daily.      . prochlorperazine (COMPAZINE) 10 MG tablet Take 1 tablet (10 mg total) by mouth every 6 (six) hours as needed (nausea).  30 tablet  0  . simvastatin (ZOCOR) 40 MG tablet Take 40 mg by mouth every evening.      . warfarin (COUMADIN) 5 MG tablet Take 5 mg by mouth daily. Or as directed         ALLERGIES: Review of patient's allergies indicates no known allergies.   LABORATORY DATA:  Lab Results  Component Value Date   WBC 4.1 02/08/2012   HGB 14.9 02/08/2012   HCT 41.5 02/08/2012   MCV 86.8 02/08/2012   PLT 139* 02/08/2012   Lab Results  Component Value Date   NA 141 01/15/2012   K 4.4 01/15/2012   CL 106 01/15/2012   CO2 22 01/15/2012   Lab Results  Component Value Date   ALT 28 01/15/2012   AST 21 01/15/2012   ALKPHOS 64 01/15/2012   BILITOT 0.5 01/15/2012     NARRATIVE: Richard Warner was seen today for weekly treatment management. The chart was checked and the patient's films were reviewed. The patient states that he is doing  very well. He does have a change in appetite/taste but he is reeking lots of fluids. He feels that he is eating fine. A couple of bouts of diarrhea but these quickly resolved.  PHYSICAL EXAMINATION: weight is 352 lb 8 oz (159.893 kg). His temperature is 98 F (36.7 C). His blood pressure is 132/82 and his pulse is 77. His respiration is 20.        ASSESSMENT: The patient is doing satisfactorily with treatment.  PLAN: We will continue with the patient's radiation treatment as planned.

## 2012-02-22 NOTE — Telephone Encounter (Signed)
appts made and printed for pt aom °

## 2012-02-22 NOTE — Progress Notes (Signed)
OFFICE PROGRESS NOTE  Interval history:  [name] returns as scheduled. He continues radiation and Xeloda . He denies mouth sores. No nausea or vomiting. No diarrhea. No hand or foot pain or redness.   Objective: Blood pressure 133/61, pulse 63, temperature 98 F (36.7 C), temperature source Oral, height 5' 11.5" (1.816 m), weight 352 lb 12.8 oz (160.029 kg).  Oropharynx is without thrush or ulceration. Lungs are clear. Regular cardiac rhythm. Abdomen is soft. The midline abdominal wound appears healed. There is mild surrounding erythema in a tape distribution at the inferior aspect of the abdominal scar. Pitting edema at the lower legs bilaterally right greater than left. Chronic stasis changes at the lower legs bilaterally.  Lab Results: Lab Results  Component Value Date   WBC 5.0 02/22/2012   HGB 15.0 02/22/2012   HCT 44.2 02/22/2012   MCV 93.5 02/22/2012   PLT 113* 02/22/2012    Chemistry:    Chemistry      Component Value Date/Time   NA 141 01/15/2012 1119   K 4.4 01/15/2012 1119   CL 106 01/15/2012 1119   CO2 22 01/15/2012 1119   BUN 22 01/15/2012 1119   CREATININE 1.46* 01/15/2012 1119      Component Value Date/Time   CALCIUM 9.4 01/15/2012 1119   ALKPHOS 64 01/15/2012 1119   AST 21 01/15/2012 1119   ALT 28 01/15/2012 1119   BILITOT 0.5 01/15/2012 1119       Studies/Results: No results found.  Medications: I have reviewed the patient's current medications.  Assessment/Plan:  1. Rectal cancer, stage II (pT3 pN0) status post low anterior resection 12/14/2011. The tumor was noted to be at 10 cm on a rigid proctoscopy at the time of surgery. He began radiation and Xeloda on 01/31/2012. 2. History of iron deficiency anemia likely secondary to rectal bleeding. Resolved. 3. 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 He is maintained on Coumadin. 4. Renal insufficiency. No dose adjustment needed on Xeloda per consultation with the cancer Center  pharmacy. 5. Hypertension. 6. Abdominal wound. The wound has healed.  Disposition-[name] appears well. We will continue to check weekly CBCs and PTs throughout the course of treatment. He will return for an office visit on 04/01/2012. He will contact the office in the interim with any problems.  Plan reviewed with Dr. Truett Perna.  Lonna Cobb ANP/GNP-BC

## 2012-02-22 NOTE — Progress Notes (Signed)
Patient alert,oriented x3, steady gait, no c/o irritation to bottom, does have spasms after bowel movements, no c/o nausea, or diarrhea, took ortho vitals, completed 16/28 rad tx's, voiced no c/o pain, or fatigue,does take naps around 230-3pm  2-3 x week, played golf week before last Sunday, 9 holes, took ortho vitals, sitting=98.2.b/p=133/61,o=63,rr=20, standiong b/p=132/82, p=77,rr=20,  11:39 AM

## 2012-02-23 ENCOUNTER — Encounter: Payer: Self-pay | Admitting: Radiation Oncology

## 2012-02-25 ENCOUNTER — Ambulatory Visit
Admission: RE | Admit: 2012-02-25 | Discharge: 2012-02-25 | Disposition: A | Discharge status: Home / Self Care | Payer: Medicaid Other | Source: Ambulatory Visit | Attending: Radiation Oncology | Admitting: Radiation Oncology

## 2012-02-26 ENCOUNTER — Ambulatory Visit (INDEPENDENT_AMBULATORY_CARE_PROVIDER_SITE_OTHER): Payer: Medicaid Other | Admitting: Surgery

## 2012-02-26 ENCOUNTER — Encounter (INDEPENDENT_AMBULATORY_CARE_PROVIDER_SITE_OTHER): Payer: Self-pay | Admitting: Surgery

## 2012-02-26 ENCOUNTER — Ambulatory Visit
Admission: RE | Admit: 2012-02-26 | Discharge: 2012-02-26 | Disposition: A | Discharge status: Home / Self Care | Payer: Medicaid Other | Source: Ambulatory Visit | Attending: Radiation Oncology | Admitting: Radiation Oncology

## 2012-02-26 VITALS — BP 154/82 | HR 60 | Temp 97.8°F | Resp 16 | Ht 72.0 in | Wt 348.6 lb

## 2012-02-26 DIAGNOSIS — C20 Malignant neoplasm of rectum: Secondary | ICD-10-CM

## 2012-02-26 DIAGNOSIS — IMO0002 Reserved for concepts with insufficient information to code with codable children: Secondary | ICD-10-CM

## 2012-02-26 NOTE — Patient Instructions (Addendum)
Surgical follow-up after low rectal cancer resection: -Rectal exam at 6 months 1st year after surgery -Colonoscopy at end of 1st year (& probable 4th year) -Rectal exam Q1months 2nd year -Rectal exam annually 3rd-5th years  Colorectal Cancer The colon is the large bowel and is the storage part of the bowel for undigested food. The large bowel is also called the intestine or gut. Cancer is a growth that is not supposed to be there.  RISK FACTORS Risks for cancer of the colon include:   Age. More than 90 percent of people with this disease are diagnosed after age 60.   Polyps. Growths on the inner wall of the colon or rectum may become cancerous.   Family history. Some cancers of the colon are inherited or run in families. These include:   Hereditary nonpolyposis colon cancer (HNPCC) is the most common type of inherited (genetic) colorectal cancer. It accounts for about 2 percent of all colorectal cancer cases. It is caused by genetic changes. About 75% of people with an altered HNPCC gene develop colon cancer, and the average age at diagnosis of colon cancer is 65.   Familial adenomatous polyposis (FAP) is a rare inherited condition in which hundreds of polyps form in the colon and rectum. It is caused by a change in a specific gene called APC. Unless FAP is treated, it usually leads to colorectal cancer by age 71. FAP accounts for less than 1 percent of all colorectal cancer cases.   Family members of people who have HNPCC or FAP can have genetic testing to check for specific genetic changes. For those who have changes in their genes, health care providers may suggest ways to try to reduce the risk of colorectal cancer or to improve the detection of this disease. For adults with FAP, the doctor may recommend an operation to remove all or part of the colon and rectum.   Personal history of colorectal cancer. A person who has already had colorectal cancer may develop colorectal cancer a second  time. Also, women with a history of cancer of the ovary, uterus (endometrium), or breast are at a somewhat higher risk of developing colorectal cancer.   Ulcerative colitis or Crohn's disease. A person who has had a condition that causes inflammation of the colon (such as ulcerative colitis or Crohn's disease) for many years is at increased risk of developing colorectal cancer.   Diet. Studies suggest that diets high in fat (especially animal fat) and low in calcium, folate, and fiber may increase the risk of colorectal cancer. Also, some studies suggest that people who eat a diet very low in fruits and vegetables may have a higher risk of colorectal cancer. More research is needed to better understand how diet affects the risk of colorectal cancer.   Cigarette smoking. A person who smokes cigarettes may be at increased risk of developing polyps and colorectal cancer.  SYMPTOMS  Changes in bowel habits.   Diarrhea, constipation, or feeling that the bowel does not empty completely.   Blood (either bright red or very dark) in the stool.   Stools that are narrower than usual.   General discomfort in your belly (abdomen): frequent gas pains, bloating, fullness, and/or cramps.   Weight loss with no known reason.   Constant tiredness.   Nausea and vomiting.  Other health problems can cause the same symptoms, and often these symptoms are not due to cancer. Anyone with these symptoms should see a doctor so that any problem can be  diagnosed and treated as early as possible. Usually, early cancer does not cause pain. It is important not to wait to feel pain before seeing a doctor. DIAGNOSIS  If you have any signs or symptoms of colorectal cancer, the doctor must determine whether they are due to cancer or some other cause. The doctor will ask about personal and family medical history and may do a physical exam. You may have one or more tests to screen for cancer. If the physical exam and test  results do not suggest cancer, the doctor may decide that no further tests are needed and no treatment is necessary. Your caregiver may recommend a schedule for checkups. If X-rays show an abnormal area (such as a polyp), a piece of tissue is taken (biopsy) to check for cancer cells. Often, the abnormal tissue can be removed during a test that examines the colon. These tests include:  Sigmoidoscopy. With this test, the caregiver can see inside your large intestine. A thin flexible tube is placed into your rectum. The device is called a sigmoidoscope. This device has a light and a tiny video camera in it. The caregiver uses the sigmoidoscope to look at the last third of your large intestine.   Colonoscopy. This test is like sigmoidoscopy, but the caregiver looks at all of the large intestine. It usually requires sedation.  If a biopsy was taken, a specialist in examining tissues (pathologist) checks the tissue for cancer cells using a microscope.  If the biopsy shows that cancer is present, the doctor needs to know the extent (stage) of the disease to plan the best treatment. The stage is based on whether the tumor has invaded nearby tissues, whether the cancer has spread, and if so, to what parts of the body. Staging may involve some of the following tests and procedures:  Blood tests. The doctor checks for carcinoembryonic antigen (CEA) and other substances in the blood. Some people who have colorectal cancer have a high CEA level.   Sigmoidoscopy.   Colonoscopy.   Endorectal ultrasound. An ultrasound probe is inserted into the rectum. The probe sends out sound waves that people cannot hear. The waves bounce off the rectum and nearby tissues, and a computer uses the echoes to create a picture. The picture shows how deep a rectal tumor has grown or whether the cancer has spread to lymph nodes or other nearby tissues.   Chest X-ray. X-rays of the chest can show whether cancer has spread to the lungs.    CT scan. This is a X-ray machine linked to a computer takes pictures of areas inside the body. The patient may receive an injection of dye. Tumors in the liver, lungs, or elsewhere in the body show up on the CT scan.  The doctor also may use other tests (such as MRI) to see whether the cancer has spread. Sometimes staging is not complete until the patient has surgery to remove the tumor. (Surgery for colorectal cancer is described in the "Treatment" section.) DOCTORS DESCRIBE COLORECTAL CANCER BY THE FOLLOWING STAGES:   Stage 0. The cancer is found only in the innermost lining of the colon or rectum. Another name for Stage 0 colorectal cancer is Carcinoma in situ.   Stage I. The cancer has grown into the inner wall of the colon or rectum. The tumor has not reached the outer wall of the colon or extended outside the colon. Another name for Stage I colorectal cancer is Dukes' A.   Stage II. The tumor  extends more deeply into or through the wall of the colon or rectum. It may have invaded nearby tissue, but cancer cells have not spread to the lymph nodes. Another name for Stage II colorectal cancer is Dukes' B.   Stage III. The cancer has spread to nearby lymph nodes but not to other parts of the body. Another name for Stage III colorectal cancer is Dukes' C.   Stage IV. The cancer has spread to other parts of the body, such as the liver or lungs. Another name for Stage IV colorectal cancer is Dukes' D.   Recurrent cancer. This is cancer that has been treated and has returned after a period of time when the cancer could not be detected. The disease may return in the colon, rectum or in another part of the body.  Once you know the diagnosis and the stage of cancer of the colon, your caregiver can advise you and help you decide what the best course of treatment will be.  Document Released: 07/23/2005 Document Revised: 07/12/2011 Document Reviewed: 07/10/2011 [facility] Patient Information 2012  [place], [place].

## 2012-02-26 NOTE — Progress Notes (Signed)
Subjective:     Patient ID: Richard Warner, male   DOB: 10/06/1950, 61 y.o.   MRN: [id number]  HPI   Richard Warner  October 26, 1950 [id number]  Patient Care Team: [name], MD as PCP - General (Family Medicine) [name], MD as Consulting Physician (Gastroenterology) [name], MD as Consulting Physician (Pulmonary Disease) [name], MD as Consulting Physician (Cardiology) [name], MD as Consulting Physician (Medical Oncology) [name], MD as Consulting Physician (Radiation Oncology) [name], MD (General Surgery)  This patient is a 61 y.o.male who presents today for surgical evaluation.   Procedure: Low anterior rectosigmoid resection of mid rectal cancer 12/14/2011  Pathology:  FINAL DIAGNOSIS Diagnosis Rectum, resection, mid - INVASIVE ADENOCARCINOMA, INVADING THROUGH THE MUSCULARIS PROPRIA INTO PERICOLONIC FATTY TISSUE. - NO EVIDENCE OF ANGIOLYMPHATIC INVASION IDENTIFIED. - TWELVE PERICOLONIC LYMPH NODES, NEGATIVE FOR METASTATIC CARCINOMA (0/12). - RESECTION MARGINS ARE NEGATIVE FOR ATYPIA OR MALIGNANCY.  The patient comes in by himself. No soreness. Eating better.  Regular bowel movements a day. Energy good. No fevers or chills. No more drainage from the incision.  It has dried up appear he continues the oral Xeloda.  He notes he gets some drainage with flatus and mucus which is somewhat annoying but no severe pain or bleeding.  Tolerating radiation as well.  Follow closely by radiation and medical oncology  The drainage has gone down. He feels better.  Changing wick 1x/week.  He is in the middle of oral chemotherapy and radiation therapy.    Patient Active Problem List  Diagnosis  . HYPOTHYROIDISM  . DYSLIPIDEMIA  . Obesity, Class III, BMI 40-49.9 (morbid obesity)  . HYPERTENSION  . PULMONARY EMBOLISM AND INFARCTION  . RENAL DISEASE, CHRONIC, STAGE III  . DVT (deep venous thrombosis)  . Iron deficiency anemia,  unspecified  . Benign neoplasm of colon  . Clotting disorder  . 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 Seroma, postoperative    Past Medical History  Diagnosis Date  . Hypertension   . Pulmonary embolism 12-03-11    01-17-10 s/p DVT left leg -Coumadin tx.until 08-28-11  . DVT (deep vein thrombosis) in pregnancy 12-03-11    6'2011-left leg  . Hyperlipidemia   . Leg swelling 12-03-11    bilateral if up most of day,equally swells, up to knee level  . Shortness of breath 12-03-11    hx. 6'11- during pulmonary emboli episode, none now, extreme exertion only.  . Fractures 12-03-11    s/p right ankle and right wrist- no problems now  . Hypothyroidism   . Dyslipidemia   . Anemia 12-03-11    tx. oral iron supplement  . Arthritis 12-03-11    mild lower back  . Chronic kidney disease 12-03-11    kidney stone x1 ,none recent stage III  . 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 Colonoscopy 10/08/2011    Procedure: COLONOSCOPY;  Surgeon: [name], MD;  Location: [facility];  Service: Endoscopy;  Laterality: N/A;  . Tonsillectomy 12-03-11    child  . Proctoscopy 12/14/2011    Procedure: PROCTOSCOPY;  Surgeon: [name], MD;  Location: [facility];  Service: General;  Laterality: N/A;  . Low anterior bowel resection 12/14/2011    Mid/proximal rectal cancer. T3 N0  History   Social History  . Marital Status: Single    Spouse Name: N/A    Number of Children: N/A  . Years of Education: N/A   Occupational History  . MERCHANT     SELF EMPLOYED   Social History Main Topics  . Smoking status: Never Smoker   . Smokeless tobacco: Never Used  . Alcohol Use: No  . Drug Use: No  . Sexually Active: Not on file   Other Topics Concern  . Not on file   Social  History Narrative  . No narrative on file    Family History  Problem Relation Age of Onset  . Diabetes Father   . Heart disease Father   . Stroke Maternal Grandmother   . Stroke Maternal Grandfather   . Hypertension Paternal Grandfather   . Lung cancer Paternal Grandfather   . Cancer Paternal Grandfather     Current Outpatient Prescriptions  Medication Sig Dispense Refill  . aspirin EC 81 MG tablet Take 81 mg by mouth daily.      [name] atenolol (TENORMIN) 100 MG tablet Take 100 mg by mouth every evening.       . capecitabine (XELODA) 500 MG tablet Take 2,000 mg by mouth 2 (two) times daily after a meal. Take twice daily on M-F (days of radiation only) #240--received thru [facility]      . doxazosin (CARDURA) 2 MG tablet Take 2 mg by mouth at bedtime.      . Ferrous Sulfate Dried 200 (65 FE) MG TABS Take 1 tab twice daily      . levothyroxine (SYNTHROID, LEVOTHROID) 25 MCG tablet Take 25 mcg by mouth daily with breakfast.       . Multiple Vitamin (MULITIVITAMIN WITH MINERALS) TABS Take 1 tablet by mouth daily.      . simvastatin (ZOCOR) 40 MG tablet Take 40 mg by mouth every evening.      . warfarin (COUMADIN) 5 MG tablet Take 5 mg by mouth daily. Or as directed      . prochlorperazine (COMPAZINE) 10 MG tablet Take 1 tablet (10 mg total) by mouth every 6 (six) hours as needed (nausea).  30 tablet  0     No Known Allergies  BP 154/82  Pulse 60  Temp 97.8 F (36.6 C) (Temporal)  Resp 16  Ht 6' (1.829 m)  Wt 348 lb 9.6 oz (158.124 kg)  BMI 47.28 kg/m2  No results found.   Review of Systems  Constitutional: Negative for fever, chills and diaphoresis.  HENT: Negative for sore throat, trouble swallowing and neck pain.   Eyes: Negative for photophobia and visual disturbance.  Respiratory: Negative for choking and shortness of breath.   Cardiovascular: Negative for chest pain and palpitations.  Gastrointestinal: Negative for nausea, vomiting, abdominal distention, anal bleeding  and rectal pain.  Genitourinary: Negative for dysuria, urgency, difficulty urinating and testicular pain.  Musculoskeletal: Negative for myalgias, arthralgias and gait problem.  Skin: Negative for color change and rash.  Neurological: Negative for dizziness, speech difficulty, weakness and numbness.  Hematological: Negative for adenopathy.  Psychiatric/Behavioral: Negative for hallucinations, confusion and agitation.       Objective:   Physical Exam  Constitutional: He is oriented to person, place, and time. He appears well-developed and well-nourished. No distress.  HENT:  Head: Normocephalic.  Mouth/Throat: Oropharynx is clear and moist. No oropharyngeal exudate.  Eyes: Conjunctivae and EOM are normal. Pupils are equal, round, and reactive to light. No scleral icterus.  Neck:  Normal range of motion. No tracheal deviation present.  Cardiovascular: Normal rate, normal heart sounds and intact distal pulses.   Pulmonary/Chest: Effort normal. No respiratory distress.  Abdominal: Soft. He exhibits no distension. There is no tenderness. Hernia confirmed negative in the right inguinal area and confirmed negative in the left inguinal area.         Incisions clean with normal healing ridges.  No hernias  Musculoskeletal: Normal range of motion. He exhibits no tenderness.  Neurological: He is alert and oriented to person, place, and time. No cranial nerve deficit. He exhibits normal muscle tone. Coordination normal.  Skin: Skin is warm and dry. No rash noted. He is not diaphoretic.  Psychiatric: He has a normal mood and affect. His behavior is normal.       Assessment:     T3N0 rectal cancer  Post-op seroma resolved   Plan:     -RTC to see me 3 months  Surgical follow-up after low rectal cancer resection: -Rectal exam Q6 months 1st two years after surgery -Colonoscopy at end of 1st year (& probable 4th year) -Rectal exam annually 3rd-5th years   -postadj oral Xeloda & XRT per  Med/Rad Onc   -Increase activity as tolerated.  Do not push through pain.  -bowel regimen to avoid problems.  -Pt expressed understanding and appreciation

## 2012-02-27 ENCOUNTER — Ambulatory Visit
Admission: RE | Admit: 2012-02-27 | Discharge: 2012-02-27 | Disposition: A | Discharge status: Home / Self Care | Payer: Medicaid Other | Source: Ambulatory Visit | Attending: Radiation Oncology | Admitting: Radiation Oncology

## 2012-02-28 ENCOUNTER — Ambulatory Visit
Admission: RE | Admit: 2012-02-28 | Discharge: 2012-02-28 | Disposition: A | Discharge status: Home / Self Care | Payer: Medicaid Other | Source: Ambulatory Visit | Attending: Radiation Oncology | Admitting: Radiation Oncology

## 2012-02-28 ENCOUNTER — Encounter: Payer: Self-pay | Admitting: Radiation Oncology

## 2012-02-28 VITALS — BP 127/78 | HR 76 | Temp 97.4°F | Resp 20 | Wt 349.3 lb

## 2012-02-28 DIAGNOSIS — C20 Malignant neoplasm of rectum: Secondary | ICD-10-CM

## 2012-02-28 NOTE — Progress Notes (Signed)
   Department of Radiation Oncology  Phone:  [phone] Fax:        [phone]  Weekly Treatment Note    Name: Richard Warner Date: 02/28/2012 MRN: [id number] DOB: Dec 17, 1950   Current dose: 36 Gy  Current fraction: 20   MEDICATIONS: Current Outpatient Prescriptions  Medication Sig Dispense Refill  . aspirin EC 81 MG tablet Take 81 mg by mouth daily.      [name] atenolol (TENORMIN) 100 MG tablet Take 100 mg by mouth every evening.       . capecitabine (XELODA) 500 MG tablet Take 2,000 mg by mouth 2 (two) times daily after a meal. Take twice daily on M-F (days of radiation only) #240--received thru [facility]      . doxazosin (CARDURA) 2 MG tablet Take 2 mg by mouth at bedtime.      . Ferrous Sulfate Dried 200 (65 FE) MG TABS Take 1 tab twice daily      . levothyroxine (SYNTHROID, LEVOTHROID) 25 MCG tablet Take 25 mcg by mouth daily with breakfast.       . Multiple Vitamin (MULITIVITAMIN WITH MINERALS) TABS Take 1 tablet by mouth daily.      . prochlorperazine (COMPAZINE) 10 MG tablet Take 1 tablet (10 mg total) by mouth every 6 (six) hours as needed (nausea).  30 tablet  0  . simvastatin (ZOCOR) 40 MG tablet Take 40 mg by mouth every evening.      . warfarin (COUMADIN) 5 MG tablet Take 5 mg by mouth daily. Or as directed         ALLERGIES: Review of patient's allergies indicates no known allergies.   LABORATORY DATA:  Lab Results  Component Value Date   WBC 5.0 02/22/2012   HGB 15.0 02/22/2012   HCT 44.2 02/22/2012   MCV 93.5 02/22/2012   PLT 113* 02/22/2012   Lab Results  Component Value Date   NA 137 02/22/2012   K 4.2 02/22/2012   CL 105 02/22/2012   CO2 22 02/22/2012   Lab Results  Component Value Date   ALT 31 02/22/2012   AST 30 02/22/2012   ALKPHOS 59 02/22/2012   BILITOT 0.7 02/22/2012     NARRATIVE: Richard Warner was seen today for weekly treatment management. The chart was checked and the patient's films were reviewed. The patient is doing very well.  No complaints. No nausea or diarrhea. The patient continues to take concurrent Xeloda.  PHYSICAL EXAMINATION: vitals were not taken for this visit.     alert, in no acute distress. The patient's skin looks good with no substantial radiation change including the anterior abdominal/pelvic region  ASSESSMENT: The patient is doing satisfactorily with treatment.  PLAN: We will continue with the patient's radiation treatment as planned.

## 2012-02-28 NOTE — Progress Notes (Signed)
  Radiation Oncology         [phone]) [phone] ________________________________  Name: Keyion Knack MRN: [id number]  Date: 02/23/2012  DOB: 07-02-1951  COMPLEX SIMULATION  NOTE  Diagnosis: rectal cancer  Narrative The patient has initially been planned to receive a course of radiation treatment to a dose of 45 gray in 25 fractions at 1.8 gray per fraction. The patient will now receive a boost to the high risk target volume for an additional 5.4 gray. This will be delivered in 3 fractions at 1.8 gray per fraction and a cone down boost technique will be utilized. To accomplish this, an additional 4 customized blocks have been designed for this purpose. A complex isodose plan is requested to ensure that the high-risk target region receives the appropriate radiation dose and that the nearby normal structures continue to be appropriately spared. The patient's final total dose therefore will be 50.4 gray.   ________________________________ ------------------------------------------------  Radene Gunning, MD, PhD

## 2012-02-28 NOTE — Progress Notes (Signed)
Patient aletrt,oriented x3, voiced no c/o pian, nauasea,diarrhea, takes 200mg  bid xeloda daily, completed 20/28 txs so far, no c/o eating and drinking adequately 8:52 AM

## 2012-02-29 ENCOUNTER — Ambulatory Visit
Admission: RE | Admit: 2012-02-29 | Discharge: 2012-02-29 | Disposition: A | Discharge status: Home / Self Care | Payer: Medicaid Other | Source: Ambulatory Visit | Attending: Radiation Oncology | Admitting: Radiation Oncology

## 2012-02-29 ENCOUNTER — Telehealth: Payer: Self-pay | Admitting: *Deleted

## 2012-02-29 ENCOUNTER — Other Ambulatory Visit (HOSPITAL_BASED_OUTPATIENT_CLINIC_OR_DEPARTMENT_OTHER): Payer: Medicaid Other

## 2012-02-29 ENCOUNTER — Other Ambulatory Visit: Payer: Self-pay | Admitting: *Deleted

## 2012-02-29 DIAGNOSIS — C20 Malignant neoplasm of rectum: Secondary | ICD-10-CM

## 2012-02-29 DIAGNOSIS — I2699 Other pulmonary embolism without acute cor pulmonale: Secondary | ICD-10-CM

## 2012-02-29 LAB — PROTIME-INR

## 2012-02-29 LAB — CBC WITH DIFFERENTIAL/PLATELET
Basophils Absolute: 0 10*3/uL (ref 0.0–0.1)
EOS%: 2.3 % (ref 0.0–7.0)
Eosinophils Absolute: 0.1 10*3/uL (ref 0.0–0.5)
HGB: 14.8 g/dL (ref 13.0–17.1)
MCH: 32.6 pg (ref 27.2–33.4)
NEUT#: 3.9 10*3/uL (ref 1.5–6.5)
RBC: 4.54 10*6/uL (ref 4.20–5.82)
RDW: 16.2 % — ABNORMAL HIGH (ref 11.0–14.6)
lymph#: 0.4 10*3/uL — ABNORMAL LOW (ref 0.9–3.3)

## 2012-02-29 NOTE — Telephone Encounter (Signed)
Left VM to hold coumadin through weekend and recheck on 7/29. Call back to confirm.

## 2012-02-29 NOTE — Telephone Encounter (Signed)
Patient called and confirmed message received.

## 2012-03-03 ENCOUNTER — Ambulatory Visit
Admission: RE | Admit: 2012-03-03 | Discharge: 2012-03-03 | Disposition: A | Discharge status: Home / Self Care | Payer: Medicaid Other | Source: Ambulatory Visit | Attending: Radiation Oncology | Admitting: Radiation Oncology

## 2012-03-03 ENCOUNTER — Telehealth: Payer: Self-pay | Admitting: *Deleted

## 2012-03-03 ENCOUNTER — Other Ambulatory Visit (HOSPITAL_BASED_OUTPATIENT_CLINIC_OR_DEPARTMENT_OTHER): Payer: Medicaid Other | Admitting: Lab

## 2012-03-03 DIAGNOSIS — Z5181 Encounter for therapeutic drug level monitoring: Secondary | ICD-10-CM

## 2012-03-03 DIAGNOSIS — Z7901 Long term (current) use of anticoagulants: Secondary | ICD-10-CM

## 2012-03-03 DIAGNOSIS — I2699 Other pulmonary embolism without acute cor pulmonale: Secondary | ICD-10-CM

## 2012-03-03 LAB — PROTIME-INR: INR: 2.2 (ref 2.00–3.50)

## 2012-03-03 NOTE — Telephone Encounter (Signed)
Notified patient to resume coumadin at 2.5 mg daily and recheck on 8/2 as scheduled.

## 2012-03-04 ENCOUNTER — Ambulatory Visit
Admission: RE | Admit: 2012-03-04 | Discharge: 2012-03-04 | Disposition: A | Discharge status: Home / Self Care | Payer: Medicaid Other | Source: Ambulatory Visit | Attending: Radiation Oncology | Admitting: Radiation Oncology

## 2012-03-05 ENCOUNTER — Ambulatory Visit
Admission: RE | Admit: 2012-03-05 | Discharge: 2012-03-05 | Disposition: A | Discharge status: Home / Self Care | Payer: Medicaid Other | Source: Ambulatory Visit | Attending: Radiation Oncology | Admitting: Radiation Oncology

## 2012-03-06 ENCOUNTER — Ambulatory Visit
Admission: RE | Admit: 2012-03-06 | Discharge: 2012-03-06 | Disposition: A | Discharge status: Home / Self Care | Payer: Medicaid Other | Source: Ambulatory Visit | Attending: Radiation Oncology | Admitting: Radiation Oncology

## 2012-03-06 DIAGNOSIS — C20 Malignant neoplasm of rectum: Secondary | ICD-10-CM

## 2012-03-06 NOTE — Progress Notes (Signed)
HERE TODAY FOR PUT OF PELVIS.  HAS NO C/O AND SAYS THAT SKIN IN TX AREA FEELS FINE.

## 2012-03-06 NOTE — Progress Notes (Signed)
   Department of Radiation Oncology  Phone:  [phone] Fax:        [phone]  Weekly Treatment Note    Name: Richard Warner Date: 03/06/2012 MRN: [id number] DOB: 1951-05-19   Current dose: 45 Gy  Current fraction: 25   MEDICATIONS: Current Outpatient Prescriptions  Medication Sig Dispense Refill  . aspirin EC 81 MG tablet Take 81 mg by mouth daily.      [name] atenolol (TENORMIN) 100 MG tablet Take 100 mg by mouth every evening.       . capecitabine (XELODA) 500 MG tablet Take 2,000 mg by mouth 2 (two) times daily after a meal. Take twice daily on M-F (days of radiation only) #240--received thru [facility]      . doxazosin (CARDURA) 2 MG tablet Take 2 mg by mouth at bedtime.      . Ferrous Sulfate Dried 200 (65 FE) MG TABS Take 1 tab twice daily      . levothyroxine (SYNTHROID, LEVOTHROID) 25 MCG tablet Take 25 mcg by mouth daily with breakfast.       . Multiple Vitamin (MULITIVITAMIN WITH MINERALS) TABS Take 1 tablet by mouth daily.      . prochlorperazine (COMPAZINE) 10 MG tablet Take 1 tablet (10 mg total) by mouth every 6 (six) hours as needed (nausea).  30 tablet  0  . simvastatin (ZOCOR) 40 MG tablet Take 40 mg by mouth every evening.      . warfarin (COUMADIN) 5 MG tablet Take 5 mg by mouth daily. Or as directed         ALLERGIES: Review of patient's allergies indicates no known allergies.   LABORATORY DATA:  Lab Results  Component Value Date   WBC 4.9 02/29/2012   HGB 14.8 02/29/2012   HCT 42.1 02/29/2012   MCV 92.7 02/29/2012   PLT 137* 02/29/2012   Lab Results  Component Value Date   NA 137 02/22/2012   K 4.2 02/22/2012   CL 105 02/22/2012   CO2 22 02/22/2012   Lab Results  Component Value Date   ALT 31 02/22/2012   AST 30 02/22/2012   ALKPHOS 59 02/22/2012   BILITOT 0.7 02/22/2012     NARRATIVE: Richard Warner was seen today for weekly treatment management. The chart was checked and the patient's films were reviewed. The patient is doing very well.  No significant complaints. No nausea and no diarrhea.  PHYSICAL EXAMINATION: vitals were not taken for this visit.     blood pressure 122/76 pulse 81 temperature 97.6  ASSESSMENT: The patient is doing satisfactorily with treatment.  PLAN: We will continue with the patient's radiation treatment as planned.

## 2012-03-07 ENCOUNTER — Telehealth: Payer: Self-pay | Admitting: *Deleted

## 2012-03-07 ENCOUNTER — Ambulatory Visit
Admission: RE | Admit: 2012-03-07 | Discharge: 2012-03-07 | Disposition: A | Discharge status: Home / Self Care | Payer: Medicaid Other | Source: Ambulatory Visit | Attending: Radiation Oncology | Admitting: Radiation Oncology

## 2012-03-07 ENCOUNTER — Other Ambulatory Visit (HOSPITAL_BASED_OUTPATIENT_CLINIC_OR_DEPARTMENT_OTHER): Payer: Medicaid Other | Admitting: Lab

## 2012-03-07 DIAGNOSIS — C20 Malignant neoplasm of rectum: Secondary | ICD-10-CM

## 2012-03-07 DIAGNOSIS — I2699 Other pulmonary embolism without acute cor pulmonale: Secondary | ICD-10-CM

## 2012-03-07 LAB — CBC WITH DIFFERENTIAL/PLATELET
Basophils Absolute: 0 10*3/uL (ref 0.0–0.1)
Eosinophils Absolute: 0.1 10*3/uL (ref 0.0–0.5)
HGB: 14 g/dL (ref 13.0–17.1)
MCV: 93.9 fL (ref 79.3–98.0)
MONO#: 0.3 10*3/uL (ref 0.1–0.9)
MONO%: 8.1 % (ref 0.0–14.0)
NEUT#: 3.5 10*3/uL (ref 1.5–6.5)
RDW: 16.8 % — ABNORMAL HIGH (ref 11.0–14.6)
WBC: 4.3 10*3/uL (ref 4.0–10.3)

## 2012-03-07 LAB — PROTIME-INR
INR: 1.7 — ABNORMAL LOW (ref 2.00–3.50)
Protime: 20.4 Seconds — ABNORMAL HIGH (ref 10.6–13.4)

## 2012-03-07 NOTE — Telephone Encounter (Signed)
Per Dr Truett Perna , I instructed pt to go back to his usual dose of coumadin 5mg /day starting 03/12/12- he repeated this information back to me correctly

## 2012-03-10 ENCOUNTER — Ambulatory Visit
Admission: RE | Admit: 2012-03-10 | Discharge: 2012-03-10 | Disposition: A | Discharge status: Home / Self Care | Payer: Medicaid Other | Source: Ambulatory Visit | Attending: Radiation Oncology | Admitting: Radiation Oncology

## 2012-03-11 ENCOUNTER — Ambulatory Visit
Admission: RE | Admit: 2012-03-11 | Discharge: 2012-03-11 | Disposition: A | Discharge status: Home / Self Care | Payer: Medicaid Other | Source: Ambulatory Visit | Attending: Radiation Oncology | Admitting: Radiation Oncology

## 2012-03-11 ENCOUNTER — Encounter: Payer: Self-pay | Admitting: Radiation Oncology

## 2012-03-11 VITALS — BP 118/68 | HR 62 | Temp 97.8°F | Resp 20 | Wt 346.7 lb

## 2012-03-11 DIAGNOSIS — C20 Malignant neoplasm of rectum: Secondary | ICD-10-CM

## 2012-03-11 NOTE — Progress Notes (Signed)
Patient completed rad tx 28/28 , colon ca, no loose stools, nausea, , voiding well, eating okay and drinking enough  Fluids, says had rough weekend after his boost rad tx on Friday, but yesterday and today feels good, 1 month f/u appt card given 11:10 AM

## 2012-03-11 NOTE — Progress Notes (Signed)
Weekly Management Note:  Site:Pelvis boost Current Dose:  5040  cGy Projected Dose: 5040  cGy  Narrative: The patient is seen today for routine under treatment assessment. CBCT/MVCT images/port films were reviewed. The chart was reviewed.   He continues to do well from a GU and GI standpoint. He finishes his radiation therapy today. He will finish his Xeloda later today. Blood counts may satisfactory.  Physical Examination:  Filed Vitals:   03/11/12 1107  BP: 118/68  Pulse: 62  Temp: 97.8 F (36.6 C)  Resp: 20  .  Weight: 346 lb 11.2 oz (157.262 kg). No change. No significant skin changes.  Laboratory data: Lab Results  Component Value Date   WBC 4.3 03/07/2012   HGB 14.0 03/07/2012   HCT 39.7 03/07/2012   MCV 93.9 03/07/2012   PLT 135* 03/07/2012    Impression: Chemoradiation well tolerated. Chemoradiation completed.  Plan: Followup visit with Dr. Mitzi Hansen and one month.

## 2012-03-12 ENCOUNTER — Ambulatory Visit: Payer: Medicaid Other

## 2012-03-13 ENCOUNTER — Other Ambulatory Visit: Payer: Self-pay | Admitting: *Deleted

## 2012-03-13 DIAGNOSIS — C20 Malignant neoplasm of rectum: Secondary | ICD-10-CM

## 2012-03-13 MED ORDER — WARFARIN SODIUM 5 MG PO TABS: 5.0000 mg | ORAL_TABLET | Freq: Every day | ORAL | Status: DC

## 2012-03-13 NOTE — Telephone Encounter (Signed)
Pt called requesting refill of Coumadin. Reorder completed.

## 2012-04-01 ENCOUNTER — Telehealth: Payer: Self-pay | Admitting: Oncology

## 2012-04-01 ENCOUNTER — Ambulatory Visit (HOSPITAL_BASED_OUTPATIENT_CLINIC_OR_DEPARTMENT_OTHER): Payer: Medicaid Other | Admitting: Oncology

## 2012-04-01 ENCOUNTER — Other Ambulatory Visit (HOSPITAL_BASED_OUTPATIENT_CLINIC_OR_DEPARTMENT_OTHER): Payer: Medicaid Other | Admitting: Lab

## 2012-04-01 VITALS — BP 128/76 | HR 58 | Temp 97.1°F | Resp 22 | Ht 72.0 in | Wt 344.7 lb

## 2012-04-01 DIAGNOSIS — C20 Malignant neoplasm of rectum: Secondary | ICD-10-CM

## 2012-04-01 DIAGNOSIS — Z7901 Long term (current) use of anticoagulants: Secondary | ICD-10-CM

## 2012-04-01 DIAGNOSIS — I2699 Other pulmonary embolism without acute cor pulmonale: Secondary | ICD-10-CM

## 2012-04-01 DIAGNOSIS — Z86718 Personal history of other venous thrombosis and embolism: Secondary | ICD-10-CM

## 2012-04-01 DIAGNOSIS — N289 Disorder of kidney and ureter, unspecified: Secondary | ICD-10-CM

## 2012-04-01 LAB — PROTHROMBIN TIME: INR: 3.8 — ABNORMAL HIGH (ref <1.50)

## 2012-04-01 LAB — CBC WITH DIFFERENTIAL/PLATELET
Basophils Absolute: 0 10*3/uL (ref 0.0–0.1)
Eosinophils Absolute: 0.1 10*3/uL (ref 0.0–0.5)
HGB: 13.6 g/dL (ref 13.0–17.1)
MONO#: 0.4 10*3/uL (ref 0.1–0.9)
NEUT#: 3 10*3/uL (ref 1.5–6.5)
RBC: 3.97 10*6/uL — ABNORMAL LOW (ref 4.20–5.82)
RDW: 18.5 % — ABNORMAL HIGH (ref 11.0–14.6)
WBC: 3.9 10*3/uL — ABNORMAL LOW (ref 4.0–10.3)
lymph#: 0.3 10*3/uL — ABNORMAL LOW (ref 0.9–3.3)

## 2012-04-01 LAB — PROTIME-INR

## 2012-04-01 NOTE — Progress Notes (Signed)
  Radiation Oncology         [phone] ________________________________  Name: Richard Warner MRN: [id number]  Date: 03/11/2012  DOB: 01-Sep-1950  End of Treatment Note  Diagnosis:   Rectal cancer     Indication for treatment:  Curative       Radiation treatment dates:   01/31/2012 through 03/11/2012  Site/dose:   The patient was initially treated to a dose of 45 gray using a 6 field 3-D conformal technique. This was given at 1.8 gray per fraction. The patient then received a cone down boost technique, also using 6 fields. This delivered an additional 5.4 gray. The patient's final dose was 50.4 gray. The patient's treatment was prompted by poor wound healing and the need to avoid this area during his treatment which could be achieved with this alternative treatment scheme.  Narrative: The patient tolerated radiation treatment relatively well.   The patient did quite well during treatment and did not have substantial difficulties with GI toxicity.  Plan: The patient has completed radiation treatment. The patient will return to radiation oncology clinic for routine followup in one month. I advised the patient to call or return sooner if they have any questions or concerns related to their recovery or treatment. ________________________________  Radene Gunning, M.D., Ph.D.

## 2012-04-01 NOTE — Telephone Encounter (Signed)
appts made and printed for pt aom °

## 2012-04-01 NOTE — Progress Notes (Signed)
Addendum This is an addendum/correction to the complex simulation note dated 02/23/2012. A cone down boost technique was used for his final boost treatment. A total of 6 customized blocks were designed for this final portion of his treatment, not 4 fields as originally stated.

## 2012-04-01 NOTE — Addendum Note (Signed)
Encounter addended by: [name], MD on: 04/01/2012  3:49 PM<BR>     Documentation filed: Notes Section

## 2012-04-01 NOTE — Progress Notes (Signed)
   [facility]    OFFICE PROGRESS NOTE   INTERVAL HISTORY:   He completed radiation and Xeloda on 03/11/2012. He denies mouth sores and hand/foot pain. His only complaint is rectal urgency and incomplete emptying.  Objective:  Vital signs in last 24 hours:  Blood pressure 128/76, pulse 58, temperature 97.1 F (36.2 C), resp. rate 22, height 6' (1.829 m), weight 344 lb 11.2 oz (156.355 kg).    HEENT: No thrush or ulcers Resp: Lungs clear bilaterally Cardio: Regular rate and rhythm GI: No hepatosplenomegaly, nontender Vascular: Chronic stasis change at the low leg bilaterally  Skin: Radiation hyperpigmentation at the perineum and upper gluteal fold. No skin breakdown.     Lab Results:  Lab Results  Component Value Date   WBC 3.9* 04/01/2012   HGB 13.6 04/01/2012   HCT 38.2* 04/01/2012   MCV 96.2 04/01/2012   PLT 130* 04/01/2012   PT-INR 3.8    Medications: I have reviewed the patient's current medications.  Assessment/Plan: 1. Rectal cancer, stage II (pT3 pN0) status post low anterior resection 12/14/2011. The tumor was noted to be at 10 cm on a rigid proctoscopy at the time of surgery. He completed radiation and Xeloda from 01/31/2012-03/11/2012. 2. History of iron deficiency anemia likely secondary to rectal bleeding. Resolved. 3. 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 He is maintained on Coumadin. 4. Renal insufficiency. No dose adjustment needed on Xeloda per consultation with the cancer Center pharmacy. 5. Hypertension.  Disposition:  He completed adjuvant Xeloda and radiation. I recommend continuing adjuvant Xeloda to complete a six-month course of adjuvant therapy. We discussed the potential toxicities associated with single agent Xeloda including a chance for mucositis, diarrhea, and hand/foot syndrome. We also discussed the potential for an interaction with Coumadin.  Richard Warner agrees to proceed with additional Xeloda  chemotherapy. The plan is to begin adjuvant therapy on 04/07/2012. He will return for an office visit on 04/18/2012. The PT/INR will be repeated on 04/03/2012.   Thornton Papas, MD  04/01/2012  10:29 AM

## 2012-04-01 NOTE — Progress Notes (Signed)
  Radiation Oncology         [phone]) [phone] ________________________________  Name: Richard Warner MRN: [id number]  Date: 01/31/2012  DOB: Sep 29, 1950  Simulation Verification Note   NARRATIVE: The patient was brought to the treatment unit and placed in the planned treatment position. The clinical setup was verified. Then port films were obtained and uploaded to the radiation oncology medical record software.  The treatment beams were carefully compared against the planned radiation fields. The position, location, and shape of the radiation fields was reviewed. The targeted volume of tissue appears to be appropriately covered by the radiation beams. Based on my personal review, I approved the simulation verification. The patient's treatment will proceed as planned.  ________________________________   Radene Gunning, MD, PhD

## 2012-04-01 NOTE — Progress Notes (Signed)
  Radiation Oncology         [phone]) [phone] ________________________________  Name: Richard Warner MRN: [id number]  Date: 03/07/2012  DOB: 1950-10-20  Simulation Verification Note   NARRATIVE: The patient was brought to the treatment unit and placed in the planned treatment position for his boost treatment. The clinical setup was verified. Then port films were obtained and uploaded to the radiation oncology medical record software.  The treatment beams were carefully compared against the planned radiation fields. The position, location, and shape of the radiation fields was reviewed. The targeted volume of tissue appears to be appropriately covered by the radiation beams. Based on my personal review, I approved the simulation verification. The patient's treatment will proceed as planned.  ________________________________   Radene Gunning, MD, PhD

## 2012-04-02 ENCOUNTER — Other Ambulatory Visit: Payer: Self-pay | Admitting: *Deleted

## 2012-04-02 DIAGNOSIS — C20 Malignant neoplasm of rectum: Secondary | ICD-10-CM

## 2012-04-02 MED ORDER — CAPECITABINE 500 MG PO TABS: ORAL_TABLET | ORAL | Status: DC

## 2012-04-03 ENCOUNTER — Telehealth: Payer: Self-pay | Admitting: Oncology

## 2012-04-03 ENCOUNTER — Telehealth: Payer: Self-pay | Admitting: *Deleted

## 2012-04-03 ENCOUNTER — Other Ambulatory Visit (HOSPITAL_BASED_OUTPATIENT_CLINIC_OR_DEPARTMENT_OTHER): Payer: Medicaid Other | Admitting: Lab

## 2012-04-03 DIAGNOSIS — C20 Malignant neoplasm of rectum: Secondary | ICD-10-CM

## 2012-04-03 DIAGNOSIS — I2699 Other pulmonary embolism without acute cor pulmonale: Secondary | ICD-10-CM

## 2012-04-03 DIAGNOSIS — I82409 Acute embolism and thrombosis of unspecified deep veins of unspecified lower extremity: Secondary | ICD-10-CM

## 2012-04-03 LAB — PROTIME-INR
INR: 4.5 — ABNORMAL HIGH (ref 2.00–3.50)
Protime: 54 Seconds — ABNORMAL HIGH (ref 10.6–13.4)

## 2012-04-03 NOTE — Telephone Encounter (Signed)
Talked to patient, he is aware of appt tomorrow 04/04/12

## 2012-04-03 NOTE — Telephone Encounter (Signed)
Called and spoke with pt, per Lonna Cobb, NP hold Coumadin dose for today and come in tomorrow 04/04/12 for repeat lab.  Pt verbalized understanding.

## 2012-04-04 ENCOUNTER — Telehealth: Payer: Self-pay | Admitting: Oncology

## 2012-04-04 ENCOUNTER — Other Ambulatory Visit: Payer: Medicaid Other | Admitting: Lab

## 2012-04-04 ENCOUNTER — Telehealth: Payer: Self-pay | Admitting: *Deleted

## 2012-04-04 DIAGNOSIS — I82409 Acute embolism and thrombosis of unspecified deep veins of unspecified lower extremity: Secondary | ICD-10-CM

## 2012-04-04 DIAGNOSIS — C20 Malignant neoplasm of rectum: Secondary | ICD-10-CM

## 2012-04-04 DIAGNOSIS — I2699 Other pulmonary embolism without acute cor pulmonale: Secondary | ICD-10-CM

## 2012-04-04 LAB — PROTIME-INR

## 2012-04-04 NOTE — Telephone Encounter (Signed)
Called and spoke with pt, per Dr. Alcide Evener hold Coumadin dose for the weekend and will re-check lab on 04/09/12.  Pt verbalized understanding of instructions.

## 2012-04-04 NOTE — Telephone Encounter (Signed)
S/w the pt and he is aware of his lab appt on 04/09/2012

## 2012-04-09 ENCOUNTER — Other Ambulatory Visit (HOSPITAL_BASED_OUTPATIENT_CLINIC_OR_DEPARTMENT_OTHER): Payer: Medicaid Other | Admitting: Lab

## 2012-04-09 ENCOUNTER — Telehealth: Payer: Self-pay | Admitting: *Deleted

## 2012-04-09 DIAGNOSIS — I82409 Acute embolism and thrombosis of unspecified deep veins of unspecified lower extremity: Secondary | ICD-10-CM

## 2012-04-09 LAB — PROTIME-INR

## 2012-04-09 NOTE — Telephone Encounter (Signed)
Called and spoke with pt, per Dr. Truett Perna start back Coumadin 5mg  M-W-F, then 2.5mg  other days; also will re-check lab 9/9.  Pt verbalized understanding and voiced back instructions and understands scheduler will call with lab appt time 04/14/12

## 2012-04-11 ENCOUNTER — Ambulatory Visit: Payer: Medicaid Other | Admitting: Radiation Oncology

## 2012-04-11 ENCOUNTER — Telehealth: Payer: Self-pay | Admitting: Oncology

## 2012-04-11 NOTE — Telephone Encounter (Signed)
pt had called to ck on lab appt for 9/9,there was no pof entered for 9/6,looked at notes and pt was called with lab appt   aom

## 2012-04-14 ENCOUNTER — Other Ambulatory Visit: Payer: Self-pay | Admitting: *Deleted

## 2012-04-14 ENCOUNTER — Other Ambulatory Visit (HOSPITAL_BASED_OUTPATIENT_CLINIC_OR_DEPARTMENT_OTHER): Payer: Medicaid Other

## 2012-04-14 DIAGNOSIS — I82409 Acute embolism and thrombosis of unspecified deep veins of unspecified lower extremity: Secondary | ICD-10-CM

## 2012-04-14 LAB — PROTIME-INR
INR: 1.3 — ABNORMAL LOW (ref 2.00–3.50)
Protime: 15.6 Seconds — ABNORMAL HIGH (ref 10.6–13.4)

## 2012-04-15 ENCOUNTER — Telehealth: Payer: Self-pay | Admitting: *Deleted

## 2012-04-15 DIAGNOSIS — I82409 Acute embolism and thrombosis of unspecified deep veins of unspecified lower extremity: Secondary | ICD-10-CM

## 2012-04-15 NOTE — Telephone Encounter (Signed)
PT INR reviewed by Dr. Truett Perna: Called pt with instructions to change Coumadin dose to 5 mg daily. He voiced understanding. Will check INR on 9/13.

## 2012-04-18 ENCOUNTER — Telehealth: Payer: Self-pay | Admitting: Oncology

## 2012-04-18 ENCOUNTER — Other Ambulatory Visit: Payer: Self-pay | Admitting: *Deleted

## 2012-04-18 ENCOUNTER — Telehealth: Payer: Self-pay | Admitting: *Deleted

## 2012-04-18 ENCOUNTER — Ambulatory Visit
Admission: RE | Admit: 2012-04-18 | Discharge: 2012-04-18 | Disposition: A | Discharge status: Home / Self Care | Payer: Medicaid Other | Source: Ambulatory Visit | Attending: Radiation Oncology | Admitting: Radiation Oncology

## 2012-04-18 ENCOUNTER — Ambulatory Visit (HOSPITAL_BASED_OUTPATIENT_CLINIC_OR_DEPARTMENT_OTHER): Payer: Medicaid Other | Admitting: Nurse Practitioner

## 2012-04-18 ENCOUNTER — Other Ambulatory Visit (HOSPITAL_BASED_OUTPATIENT_CLINIC_OR_DEPARTMENT_OTHER): Payer: Medicaid Other | Admitting: Lab

## 2012-04-18 ENCOUNTER — Encounter: Payer: Self-pay | Admitting: Radiation Oncology

## 2012-04-18 VITALS — BP 153/75 | HR 57 | Temp 97.7°F | Resp 20 | Wt 347.9 lb

## 2012-04-18 VITALS — BP 149/77 | HR 55 | Temp 97.2°F | Resp 20 | Ht 72.0 in | Wt 346.8 lb

## 2012-04-18 DIAGNOSIS — C20 Malignant neoplasm of rectum: Secondary | ICD-10-CM

## 2012-04-18 DIAGNOSIS — I82409 Acute embolism and thrombosis of unspecified deep veins of unspecified lower extremity: Secondary | ICD-10-CM

## 2012-04-18 DIAGNOSIS — I2699 Other pulmonary embolism without acute cor pulmonale: Secondary | ICD-10-CM

## 2012-04-18 DIAGNOSIS — N289 Disorder of kidney and ureter, unspecified: Secondary | ICD-10-CM

## 2012-04-18 LAB — CBC & DIFF AND RETIC
BASO%: 0.5 % (ref 0.0–2.0)
EOS%: 3.5 % (ref 0.0–7.0)
LYMPH%: 11.2 % — ABNORMAL LOW (ref 14.0–49.0)
MCH: 33.1 pg (ref 27.2–33.4)
MCHC: 35.9 g/dL (ref 32.0–36.0)
MCV: 92.2 fL (ref 79.3–98.0)
MONO%: 8.4 % (ref 0.0–14.0)
NEUT#: 3.1 10*3/uL (ref 1.5–6.5)
Platelets: 121 10*3/uL — ABNORMAL LOW (ref 140–400)
RBC: 4.35 10*6/uL (ref 4.20–5.82)
RDW: 14.4 % (ref 11.0–14.6)
Retic %: 3.19 % — ABNORMAL HIGH (ref 0.80–1.80)
Retic Ct Abs: 138.77 10*3/uL — ABNORMAL HIGH (ref 34.80–93.90)

## 2012-04-18 LAB — COMPREHENSIVE METABOLIC PANEL (CC13)
AST: 24 U/L (ref 5–34)
Albumin: 3.6 g/dL (ref 3.5–5.0)
Alkaline Phosphatase: 69 U/L (ref 40–150)
BUN: 15 mg/dL (ref 7.0–26.0)
Creatinine: 1.4 mg/dL — ABNORMAL HIGH (ref 0.7–1.3)
Glucose: 116 mg/dl — ABNORMAL HIGH (ref 70–99)
Potassium: 4.2 mEq/L (ref 3.5–5.1)

## 2012-04-18 LAB — PROTIME-INR: INR: 1.8 — ABNORMAL LOW (ref 2.00–3.50)

## 2012-04-18 MED ORDER — WARFARIN SODIUM 5 MG PO TABS: 5.0000 mg | ORAL_TABLET | Freq: Every day | ORAL | Status: DC

## 2012-04-18 MED ORDER — CAPECITABINE 500 MG PO TABS: ORAL_TABLET | ORAL | Status: DC

## 2012-04-18 NOTE — Progress Notes (Signed)
Patient here follow up , rectal radiation 01/31/12-03/11/12 Alert,oriented x3. No pain,bottom healed , normal bowel movements, just his stamina is  Weaker stated, saw Dr.Sherrill today will continue Xeloda 4 more months, eating and drinking plenty water, no nausea,  11:04 AM

## 2012-04-18 NOTE — Telephone Encounter (Signed)
[facility] faxed request for insurance information and patient's height and weight to process this prescription.for Xeloda.  Faxed the requested information with copy of Medicaid card to [employer].

## 2012-04-18 NOTE — Telephone Encounter (Signed)
gve the pt his sept,oct 2013 appt calendar °

## 2012-04-18 NOTE — Progress Notes (Signed)
Radiation Oncology         [phone] ________________________________  Name: Richard Warner MRN: [id number]  Date: 04/18/2012  DOB: Dec 05, 1950  Follow-Up Visit Note  CC: [name], MD  [name], MD G. Rolm Baptise, MD  Diagnosis:   Rectal cancer  Interval Since Last Radiation:  One month   Narrative:  The patient returns today for routine follow-up.  The patient finished his course of adjuvant radiotherapy one month ago. He indicates that he does have some ongoing fatigue but otherwise has done 3 well. He indicates that the rectal/anal region has healed. He denies any diarrhea. No nausea. He has not had any problems with further wound healing. He saw Dr. Truett Perna earlier this morning who is planning additional chemotherapy with subtle low..                              ALLERGIES:   has no known allergies.  Meds: Current Outpatient Prescriptions  Medication Sig Dispense Refill  . aspirin EC 81 MG tablet Take 81 mg by mouth daily.      [name] atenolol (TENORMIN) 100 MG tablet Take 100 mg by mouth every evening.       . capecitabine (XELODA) 500 MG tablet Take 4 tabs (= 2000mg ) every AM then take 5 tabs (= 2500mg ) every PM.  Total daily dosage =4500mg . Start 04/22/12 for 14 days.  126 tablet  0  . doxazosin (CARDURA) 2 MG tablet Take 2 mg by mouth at bedtime.      . Ferrous Sulfate Dried 200 (65 FE) MG TABS Take 1 tab twice daily      . levothyroxine (SYNTHROID, LEVOTHROID) 25 MCG tablet Take 25 mcg by mouth daily with breakfast.       . Multiple Vitamin (MULITIVITAMIN WITH MINERALS) TABS Take 1 tablet by mouth daily.      . prochlorperazine (COMPAZINE) 10 MG tablet Take 1 tablet (10 mg total) by mouth every 6 (six) hours as needed (nausea).  30 tablet  0  . simvastatin (ZOCOR) 40 MG tablet Take 40 mg by mouth every evening.      . warfarin (COUMADIN) 5 MG tablet Take 1 tablet (5 mg total) by mouth daily. Or as directed  30 tablet  2  . DISCONTD: warfarin  (COUMADIN) 5 MG tablet Take 1 tablet (5 mg total) by mouth daily. Or as directed  30 tablet  0    Physical Findings: The patient is in no acute distress. Patient is alert and oriented.  weight is 347 lb 14.4 oz (157.806 kg). His oral temperature is 97.7 F (36.5 C). His blood pressure is 153/75 and his pulse is 57. His respiration is 20. [name]   General: Well-developed, in no acute distress HEENT: Normocephalic, atraumatic Cardiovascular: Regular rate and rhythm GI: The surgical site is well-healed with no breakdown. We did carefully avoid this area in terms of his radiation planned to do some ongoing wound healing. Respiratory: Clear to auscultation bilaterally Extremities: No edema present   Lab Findings: Lab Results  Component Value Date   WBC 4.0 04/18/2012   HGB 14.4 04/18/2012   HCT 40.1 04/18/2012   MCV 92.2 04/18/2012   PLT 121* 04/18/2012     Radiographic Findings: No results found.  Impression:    61 year old male status post adjuvant radiotherapy for his rectal cancer. He is doing well.  Plan:  We discussed possible followup options and the patient  indicated that he wishes to followup on a when necessary basis.   Radene Gunning, M.D., Ph.D.

## 2012-04-18 NOTE — Progress Notes (Signed)
OFFICE PROGRESS NOTE  Interval history:  Richard Warner returns as scheduled. He has not received the Xeloda. He feels well. No nausea or vomiting. No mouth sores. No diarrhea. No hand or foot pain or redness. He denies bleeding.   Objective: Blood pressure 149/77, pulse 55, temperature 97.2 F (36.2 C), temperature source Oral, resp. rate 20, height 6' (1.829 m), weight 346 lb 12.8 oz (157.307 kg).  Oropharynx is without thrush or ulceration. Lungs are clear. Regular cardiac rhythm. Abdomen is soft and nontender. Obese. No obvious organomegaly. Chronic stasis changes at the lower legs bilaterally. Trace lower leg edema bilaterally.  Lab Results: Lab Results  Component Value Date   WBC 4.0 04/18/2012   HGB 14.4 04/18/2012   HCT 40.1 04/18/2012   MCV 92.2 04/18/2012   PLT 121* 04/18/2012    Chemistry:    Chemistry      Component Value Date/Time   NA 141 04/18/2012 0953   NA 137 02/22/2012 1222   K 4.2 04/18/2012 0953   K 4.2 02/22/2012 1222   CL 109* 04/18/2012 0953   CL 105 02/22/2012 1222   CO2 23 04/18/2012 0953   CO2 22 02/22/2012 1222   BUN 15.0 04/18/2012 0953   BUN 18 02/22/2012 1222   CREATININE 1.4* 04/18/2012 0953   CREATININE 1.41* 02/22/2012 1222      Component Value Date/Time   CALCIUM 9.0 04/18/2012 0953   CALCIUM 9.2 02/22/2012 1222   ALKPHOS 69 04/18/2012 0953   ALKPHOS 59 02/22/2012 1222   AST 24 04/18/2012 0953   AST 30 02/22/2012 1222   ALT 30 04/18/2012 0953   ALT 31 02/22/2012 1222   BILITOT 0.80 04/18/2012 0953   BILITOT 0.7 02/22/2012 1222       Studies/Results: No results found.  Medications: I have reviewed the patient's current medications.  Assessment/Plan:  1. Rectal cancer, stage II (pT3 pN0) status post low anterior resection 12/14/2011. The tumor was noted to be at 10 cm on a rigid proctoscopy at the time of surgery. He completed radiation and Xeloda from 01/31/2012-03/11/2012. 2. History of iron deficiency anemia likely secondary to rectal bleeding.  Resolved. 3. 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 He is maintained on Coumadin. He will continue at the current dose of 5 mg daily. He will return for a followup PT/INR on 04/25/2012. 4. Renal insufficiency. No dose adjustment needed on Xeloda per consultation with the cancer Center pharmacy. 5. Hypertension.  Disposition-Richard Warner appears stable. We are anticipating he will begin the first cycle of Xeloda 04/22/2012. He will return for a followup visit on 05/08/2012. We will have him return for a followup PT/INR on 04/25/2012. He will contact the office in the interim with any problems.  Plan reviewed with Dr. Truett Perna.  Lonna Cobb ANP/GNP-BC

## 2012-04-22 NOTE — Telephone Encounter (Signed)
RECEIVED A FAX FROM [employer] CONCERNING A CONFIRMATION OF PRESCRIPTION SHIPMENT FOR XELODA ON 04/21/12. 

## 2012-04-25 ENCOUNTER — Other Ambulatory Visit (HOSPITAL_BASED_OUTPATIENT_CLINIC_OR_DEPARTMENT_OTHER): Payer: Medicaid Other | Admitting: Lab

## 2012-04-25 ENCOUNTER — Telehealth: Payer: Self-pay | Admitting: *Deleted

## 2012-04-25 DIAGNOSIS — I82409 Acute embolism and thrombosis of unspecified deep veins of unspecified lower extremity: Secondary | ICD-10-CM

## 2012-04-25 DIAGNOSIS — I2699 Other pulmonary embolism without acute cor pulmonale: Secondary | ICD-10-CM

## 2012-04-25 LAB — PROTIME-INR: Protime: 32.4 Seconds — ABNORMAL HIGH (ref 10.6–13.4)

## 2012-04-25 NOTE — Telephone Encounter (Signed)
Left message on voicemail for pt to call office re: lab results. Continue same dose of Coumadin, per Dr. Truett Perna.

## 2012-05-08 ENCOUNTER — Telehealth: Payer: Self-pay | Admitting: Oncology

## 2012-05-08 ENCOUNTER — Ambulatory Visit: Payer: Medicaid Other | Admitting: Oncology

## 2012-05-08 ENCOUNTER — Other Ambulatory Visit: Payer: Medicaid Other | Admitting: Lab

## 2012-05-08 VITALS — BP 148/75 | HR 64 | Temp 97.2°F | Resp 20 | Ht 72.0 in | Wt 348.9 lb

## 2012-05-08 DIAGNOSIS — I2699 Other pulmonary embolism without acute cor pulmonale: Secondary | ICD-10-CM

## 2012-05-08 DIAGNOSIS — Z7901 Long term (current) use of anticoagulants: Secondary | ICD-10-CM

## 2012-05-08 DIAGNOSIS — Z5181 Encounter for therapeutic drug level monitoring: Secondary | ICD-10-CM

## 2012-05-08 DIAGNOSIS — C20 Malignant neoplasm of rectum: Secondary | ICD-10-CM

## 2012-05-08 DIAGNOSIS — D6959 Other secondary thrombocytopenia: Secondary | ICD-10-CM

## 2012-05-08 DIAGNOSIS — I82409 Acute embolism and thrombosis of unspecified deep veins of unspecified lower extremity: Secondary | ICD-10-CM

## 2012-05-08 LAB — CBC WITH DIFFERENTIAL/PLATELET
BASO%: 0.2 % (ref 0.0–2.0)
EOS%: 3.1 % (ref 0.0–7.0)
HCT: 37.9 % — ABNORMAL LOW (ref 38.4–49.9)
LYMPH%: 11.6 % — ABNORMAL LOW (ref 14.0–49.0)
MCH: 34.7 pg — ABNORMAL HIGH (ref 27.2–33.4)
MCHC: 35.9 g/dL (ref 32.0–36.0)
NEUT%: 75.8 % — ABNORMAL HIGH (ref 39.0–75.0)
Platelets: 128 10*3/uL — ABNORMAL LOW (ref 140–400)
RBC: 3.92 10*6/uL — ABNORMAL LOW (ref 4.20–5.82)
lymph#: 0.5 10*3/uL — ABNORMAL LOW (ref 0.9–3.3)

## 2012-05-08 LAB — COMPREHENSIVE METABOLIC PANEL (CC13)
ALT: 36 U/L (ref 0–55)
AST: 28 U/L (ref 5–34)
Albumin: 3.5 g/dL (ref 3.5–5.0)
CO2: 22 mEq/L (ref 22–29)
Calcium: 8.5 mg/dL (ref 8.4–10.4)
Chloride: 111 mEq/L — ABNORMAL HIGH (ref 98–107)
Potassium: 4 mEq/L (ref 3.5–5.1)
Total Protein: 5.9 g/dL — ABNORMAL LOW (ref 6.4–8.3)

## 2012-05-08 LAB — PROTIME-INR

## 2012-05-08 MED ORDER — CAPECITABINE 500 MG PO TABS: ORAL_TABLET | ORAL | Status: DC

## 2012-05-08 NOTE — Telephone Encounter (Signed)
appts made and printed for pt aom °

## 2012-05-08 NOTE — Progress Notes (Signed)
   [facility]    OFFICE PROGRESS NOTE   INTERVAL HISTORY:   He returns as scheduled. He completed a cycle of Xeloda on 05/05/2012. No mouth sores, diarrhea, or hand/foot pain. He continues Coumadin. No complaint.  Objective:  Vital signs in last 24 hours:  Blood pressure 148/75, pulse 64, temperature 97.2 F (36.2 C), temperature source Oral, resp. rate 20, height 6' (1.829 m), weight 348 lb 14.4 oz (158.26 kg).    HEENT: No thrush or ulcers Resp: Lungs clear bilaterally Cardio: Regular rate and rhythm GI: Nontender, no hepatomegaly Vascular: Chronic stasis change with trace edema at the right greater than left lower leg  Skin: Mild superficial desquamation at the left plantar foot, palms without erythema     Lab Results:  Lab Results  Component Value Date   WBC 4.1 05/08/2012   HGB 13.6 05/08/2012   HCT 37.9* 05/08/2012   MCV 96.6 05/08/2012   PLT 128* 05/08/2012   ANC 3.1 PT/INR 4.06  Medications: I have reviewed the patient's current medications.  Assessment/Plan: 1. Rectal cancer, stage II (pT3 pN0) status post low anterior resection 12/14/2011. The tumor was noted to be at 10 cm on a rigid proctoscopy at the time of surgery. He completed radiation and Xeloda from 01/31/2012-03/11/2012. He began a first cycle of adjuvant Xeloda on 04/22/2012. 2. History of iron deficiency anemia likely secondary to rectal bleeding. Resolved. 3. 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 He is maintained on Coumadin. The PT/INR is supratherapeutic today. He will hold Coumadin and return for a PT/INR on 05/09/2012  4. Renal insufficiency. No dose adjustment needed on Xeloda per consultation with the cancer Center pharmacy. 5. Hypertension. 6. Mild thrombocytopenia secondary to chemotherapy   Disposition:  He tolerated the first cycle of adjuvant Xeloda well. He will begin cycle 2 on 05/13/2012. Mr. Dimartino will return for an office visit on 06/03/2012. The  Coumadin dose will be adjusted as indicated .   Thornton Papas, MD  05/08/2012  3:48 PM

## 2012-05-09 ENCOUNTER — Other Ambulatory Visit: Payer: Medicaid Other | Admitting: Lab

## 2012-05-09 ENCOUNTER — Telehealth: Payer: Self-pay | Admitting: *Deleted

## 2012-05-09 DIAGNOSIS — I2699 Other pulmonary embolism without acute cor pulmonale: Secondary | ICD-10-CM

## 2012-05-09 DIAGNOSIS — Z7901 Long term (current) use of anticoagulants: Secondary | ICD-10-CM

## 2012-05-09 DIAGNOSIS — Z5181 Encounter for therapeutic drug level monitoring: Secondary | ICD-10-CM

## 2012-05-09 LAB — PROTIME-INR

## 2012-05-09 LAB — PROTHROMBIN TIME
INR: 3.97 — ABNORMAL HIGH (ref <1.50)
Prothrombin Time: 36.4 seconds — ABNORMAL HIGH (ref 11.6–15.2)

## 2012-05-09 NOTE — Telephone Encounter (Signed)
Patient confirmed over the phone the new date and time on 05-09-2012 for 05-12-2012 at 10:00am lab only

## 2012-05-09 NOTE — Telephone Encounter (Signed)
Continue to hold coumadin. Recheck Monday.

## 2012-05-12 ENCOUNTER — Other Ambulatory Visit (HOSPITAL_BASED_OUTPATIENT_CLINIC_OR_DEPARTMENT_OTHER): Payer: Medicaid Other | Admitting: Lab

## 2012-05-12 ENCOUNTER — Telehealth: Payer: Self-pay | Admitting: Oncology

## 2012-05-12 ENCOUNTER — Encounter: Payer: Self-pay | Admitting: *Deleted

## 2012-05-12 ENCOUNTER — Telehealth: Payer: Self-pay | Admitting: *Deleted

## 2012-05-12 DIAGNOSIS — I2699 Other pulmonary embolism without acute cor pulmonale: Secondary | ICD-10-CM

## 2012-05-12 LAB — PROTIME-INR: Protime: 20.4 Seconds — ABNORMAL HIGH (ref 10.6–13.4)

## 2012-05-12 NOTE — Telephone Encounter (Signed)
Scheduled lab for 10/14 10 am , per [place], pt aware per nurse

## 2012-05-12 NOTE — Telephone Encounter (Signed)
Per Dr. Truett Perna: Resume Coumadin 2.5 mg daily. Recheck 10/14. Confirmed he resumes his Xeloda on 05/13/12.

## 2012-05-12 NOTE — Progress Notes (Signed)
RECEIVED A FAX FROM [facility] CONCERNING A CONFIRMATION OF PRESCRIPTION SHIPMENT FOR XELODA.

## 2012-05-19 ENCOUNTER — Other Ambulatory Visit: Payer: Self-pay | Admitting: *Deleted

## 2012-05-19 ENCOUNTER — Other Ambulatory Visit (HOSPITAL_BASED_OUTPATIENT_CLINIC_OR_DEPARTMENT_OTHER): Payer: Medicaid Other | Admitting: Lab

## 2012-05-19 DIAGNOSIS — I82409 Acute embolism and thrombosis of unspecified deep veins of unspecified lower extremity: Secondary | ICD-10-CM

## 2012-05-19 DIAGNOSIS — I2699 Other pulmonary embolism without acute cor pulmonale: Secondary | ICD-10-CM

## 2012-05-19 LAB — PROTIME-INR: Protime: 18 Seconds — ABNORMAL HIGH (ref 10.6–13.4)

## 2012-05-19 NOTE — Progress Notes (Signed)
Patient contacted; PT/INR resulted from today; per Dr. Truett Perna, patient to continue coumadin on same regimen (2.5mg  daily).  Lab values to be re-checked next week.  Patient prefers Tuesday 05/27/2012.  POF submitted and scheduling to contact patient with time.

## 2012-05-20 ENCOUNTER — Telehealth: Payer: Self-pay | Admitting: *Deleted

## 2012-05-20 NOTE — Telephone Encounter (Signed)
Patient confirmed over the phone the new date and time lab only appointment on 05-27-2012 at 10:00

## 2012-05-27 ENCOUNTER — Telehealth: Payer: Self-pay | Admitting: *Deleted

## 2012-05-27 ENCOUNTER — Other Ambulatory Visit (HOSPITAL_BASED_OUTPATIENT_CLINIC_OR_DEPARTMENT_OTHER): Payer: Medicaid Other | Admitting: Lab

## 2012-05-27 DIAGNOSIS — I82409 Acute embolism and thrombosis of unspecified deep veins of unspecified lower extremity: Secondary | ICD-10-CM

## 2012-05-27 LAB — PROTIME-INR: INR: 1.8 — ABNORMAL LOW (ref 2.00–3.50)

## 2012-05-27 NOTE — Telephone Encounter (Signed)
INR today 1.80 Per Dr. Truett Perna go to 2.5 mg alternate with 5 mg and recheck PT on 10/29 as scheduled. Patient was not home. Left VM for him to call office tomorrow for instructions.

## 2012-05-28 ENCOUNTER — Telehealth: Payer: Self-pay | Admitting: *Deleted

## 2012-05-28 NOTE — Telephone Encounter (Signed)
Gave patient new directions with coumadin to alternate 2.5 mg with 5 mg (take 5 mg tonight). Will recheck on 10/29.

## 2012-05-30 ENCOUNTER — Encounter: Payer: Self-pay | Admitting: *Deleted

## 2012-05-30 NOTE — Progress Notes (Addendum)
RECEIVED A FAX FROM [facility] CONCERNING A CONFIRMATION OF PRESCRIPTION SHIPMENT FOR XELODA ON 05/29/12. 

## 2012-06-03 ENCOUNTER — Ambulatory Visit (HOSPITAL_BASED_OUTPATIENT_CLINIC_OR_DEPARTMENT_OTHER): Payer: Medicaid Other | Admitting: Nurse Practitioner

## 2012-06-03 ENCOUNTER — Other Ambulatory Visit (HOSPITAL_BASED_OUTPATIENT_CLINIC_OR_DEPARTMENT_OTHER): Payer: Medicaid Other | Admitting: Lab

## 2012-06-03 ENCOUNTER — Telehealth: Payer: Self-pay | Admitting: Oncology

## 2012-06-03 VITALS — BP 140/85 | HR 62 | Temp 98.5°F | Resp 22 | Ht 72.0 in | Wt 350.0 lb

## 2012-06-03 DIAGNOSIS — C20 Malignant neoplasm of rectum: Secondary | ICD-10-CM

## 2012-06-03 DIAGNOSIS — I2699 Other pulmonary embolism without acute cor pulmonale: Secondary | ICD-10-CM

## 2012-06-03 DIAGNOSIS — I82409 Acute embolism and thrombosis of unspecified deep veins of unspecified lower extremity: Secondary | ICD-10-CM

## 2012-06-03 DIAGNOSIS — D696 Thrombocytopenia, unspecified: Secondary | ICD-10-CM

## 2012-06-03 LAB — COMPREHENSIVE METABOLIC PANEL (CC13)
Albumin: 3.6 g/dL (ref 3.5–5.0)
BUN: 18 mg/dL (ref 7.0–26.0)
CO2: 24 mEq/L (ref 22–29)
Calcium: 9 mg/dL (ref 8.4–10.4)
Chloride: 108 mEq/L — ABNORMAL HIGH (ref 98–107)
Glucose: 111 mg/dl — ABNORMAL HIGH (ref 70–99)
Potassium: 4.3 mEq/L (ref 3.5–5.1)
Sodium: 139 mEq/L (ref 136–145)
Total Protein: 5.8 g/dL — ABNORMAL LOW (ref 6.4–8.3)

## 2012-06-03 LAB — CBC WITH DIFFERENTIAL/PLATELET
Basophils Absolute: 0 10*3/uL (ref 0.0–0.1)
Eosinophils Absolute: 0.1 10*3/uL (ref 0.0–0.5)
HGB: 13.4 g/dL (ref 13.0–17.1)
MCV: 100.6 fL — ABNORMAL HIGH (ref 79.3–98.0)
MONO#: 0.5 10*3/uL (ref 0.1–0.9)
MONO%: 12.9 % (ref 0.0–14.0)
NEUT#: 3.1 10*3/uL (ref 1.5–6.5)
RDW: 17.2 % — ABNORMAL HIGH (ref 11.0–14.6)
WBC: 4.2 10*3/uL (ref 4.0–10.3)
lymph#: 0.4 10*3/uL — ABNORMAL LOW (ref 0.9–3.3)

## 2012-06-03 LAB — PROTIME-INR: INR: 3.6 — ABNORMAL HIGH (ref 2.00–3.50)

## 2012-06-03 NOTE — Telephone Encounter (Signed)
gv and printed pt appt schedule for NOV °

## 2012-06-03 NOTE — Progress Notes (Signed)
OFFICE PROGRESS NOTE  Interval history:  Mr. Stockert returns as scheduled. He completed cycle 2 adjuvant Xeloda beginning 05/13/2012. He denies nausea/vomiting. No mouth sores. No diarrhea. No hand or foot pain or redness. He denies bleeding.   Objective: Blood pressure 140/85, pulse 62, temperature 98.5 F (36.9 C), temperature source Oral, resp. rate 22, height 6' (1.829 m), weight 350 lb (158.759 kg).  Oropharynx is without thrush or ulceration. Lungs are clear. Regular cardiac rhythm. Abdomen is soft, nontender. No hepatomegaly. Chronic stasis changes bilaterally with trace edema at the lower legs right greater than left. Palms without erythema.  Lab Results: Lab Results  Component Value Date   WBC 4.2 06/03/2012   HGB 13.4 06/03/2012   HCT 37.4* 06/03/2012   MCV 100.6* 06/03/2012   PLT 112* 06/03/2012    Chemistry:    Chemistry      Component Value Date/Time   NA 139 06/03/2012 0830   NA 137 02/22/2012 1222   K 4.3 06/03/2012 0830   K 4.2 02/22/2012 1222   CL 108* 06/03/2012 0830   CL 105 02/22/2012 1222   CO2 24 06/03/2012 0830   CO2 22 02/22/2012 1222   BUN 18.0 06/03/2012 0830   BUN 18 02/22/2012 1222   CREATININE 1.4* 06/03/2012 0830   CREATININE 1.41* 02/22/2012 1222      Component Value Date/Time   CALCIUM 9.0 06/03/2012 0830   CALCIUM 9.2 02/22/2012 1222   ALKPHOS 59 06/03/2012 0830   ALKPHOS 59 02/22/2012 1222   AST 28 06/03/2012 0830   AST 30 02/22/2012 1222   ALT 37 06/03/2012 0830   ALT 31 02/22/2012 1222   BILITOT 0.90 06/03/2012 0830   BILITOT 0.7 02/22/2012 1222       Studies/Results: No results found.  Medications: I have reviewed the patient's current medications.  Assessment/Plan:  1. Rectal cancer, stage II (pT3 pN0) status post low anterior resection 12/14/2011. The tumor was noted to be at 10 cm on a rigid proctoscopy at the time of surgery. He completed radiation and Xeloda from 01/31/2012-03/11/2012. He began a first cycle of adjuvant  Xeloda on 04/22/2012. He completed cycle 2 beginning 05/13/2012. 2. History of iron deficiency anemia likely secondary to rectal bleeding. Resolved. 3. 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 He is maintained on Coumadin. The PT/INR is supratherapeutic today. He will hold today's dose and resume on 06/04/2012 at a dose of 2.5 mg daily. 4. Renal insufficiency. No dose adjustment needed on Xeloda per consultation with the cancer Center pharmacy. 5. Hypertension. 6. 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  Disposition-Mr. Saintil appears stable. He has completed 2 cycles of adjuvant Xeloda. He will proceed with cycle 3 today as scheduled. He will return for a followup PT/INR on 06/09/2012. He will return for a followup visit on 06/19/2012. He will contact the office in the interim with any problems. We specifically discussed bleeding.  Plan reviewed with Dr. Truett Perna.  Lonna Cobb ANP/GNP-BC

## 2012-06-04 ENCOUNTER — Ambulatory Visit (INDEPENDENT_AMBULATORY_CARE_PROVIDER_SITE_OTHER): Payer: Medicaid Other | Admitting: Surgery

## 2012-06-04 ENCOUNTER — Encounter (INDEPENDENT_AMBULATORY_CARE_PROVIDER_SITE_OTHER): Payer: Self-pay | Admitting: Surgery

## 2012-06-04 VITALS — BP 144/78 | HR 68 | Temp 97.9°F | Resp 16 | Ht 72.0 in | Wt 350.4 lb

## 2012-06-04 DIAGNOSIS — C20 Malignant neoplasm of rectum: Secondary | ICD-10-CM

## 2012-06-04 NOTE — Progress Notes (Signed)
Subjective:     Patient ID: Richard Warner, male   DOB: 1950/10/27, 61 y.o.   MRN: [id number]  HPI   Richard Warner  May 09, 1951 [id number]  Patient Care Team: [name], MD as PCP - General (Family Medicine) [name], MD as Consulting Physician (Gastroenterology) [name], MD as Consulting Physician (Pulmonary Disease) [name], MD as Consulting Physician (Cardiology) [name], MD as Consulting Physician (Medical Oncology) [name], MD as Consulting Physician (Radiation Oncology) [name], MD (General Surgery)  This patient is a 61 y.o.male who presents today for surgical evaluation.   Procedure: Low anterior rectosigmoid resection of mid rectal cancer 12/14/2011  Pathology:  FINAL DIAGNOSIS Diagnosis Rectum, resection, mid - INVASIVE ADENOCARCINOMA, INVADING THROUGH THE MUSCULARIS PROPRIA INTO PERICOLONIC FATTY TISSUE. - NO EVIDENCE OF ANGIOLYMPHATIC INVASION IDENTIFIED. - TWELVE PERICOLONIC LYMPH NODES, NEGATIVE FOR METASTATIC CARCINOMA (0/12). - RESECTION MARGINS ARE NEGATIVE FOR ATYPIA OR MALIGNANCY.  The patient comes in by himself. No soreness. Eating fine.  Energy good. No fevers or chills.  Sometimes has to span a moderate amount of time to fully eliminate his bowels.  Consistency soft and pasty.  No decreased core.  Pencil thin.  No severe pain.  No bleeding.    He continues the oral Xeloda.   Tolerating it fine except for mildly loose stools.  No nausea vomiting.  Energy level good.  Follow closely by medical oncology   Patient Active Problem List  Diagnosis  . HYPOTHYROIDISM  . DYSLIPIDEMIA  . Obesity, Class III, BMI 40-49.9 (morbid obesity)  . HYPERTENSION  . PULMONARY EMBOLISM AND INFARCTION  . RENAL DISEASE, CHRONIC, STAGE III  . DVT (deep venous thrombosis)  . Iron deficiency anemia, unspecified  . Benign neoplasm of colon  . Clotting disorder  . 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  Seroma, postoperative    Past Medical History  Diagnosis Date  . Hypertension   . Pulmonary embolism 12-03-11    01-17-10 s/p DVT left leg -Coumadin tx.until 08-28-11  . DVT (deep vein thrombosis) in pregnancy 12-03-11    6'2011-left leg  . Hyperlipidemia   . Leg swelling 12-03-11    bilateral if up most of day,equally swells, up to knee level  . Shortness of breath 12-03-11    hx. 6'11- during pulmonary emboli episode, none now, extreme exertion only.  . Fractures 12-03-11    s/p right ankle and right wrist- no problems now  . Hypothyroidism   . Dyslipidemia   . Anemia 12-03-11    tx. oral iron supplement  . Arthritis 12-03-11    mild lower back  . Chronic kidney disease 12-03-11    kidney stone x1 ,none recent stage III  . 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 Colonoscopy 10/08/2011    Procedure: COLONOSCOPY;  Surgeon: [name], MD;  Location: [facility];  Service: Endoscopy;  Laterality: N/A;  . Tonsillectomy 12-03-11    child  . Proctoscopy 12/14/2011    Procedure: PROCTOSCOPY;  Surgeon: [name], MD;  Location: [facility];  Service: General;  Laterality: N/A;  . Low anterior bowel resection 12/14/2011    Mid/proximal rectal cancer. T3 N0    History   Social History  . Marital Status: Single  Spouse Name: N/A    Number of Children: N/A  . Years of Education: N/A   Occupational History  . MERCHANT     SELF EMPLOYED   Social History Main Topics  . Smoking status: Never Smoker   . Smokeless tobacco: Never Used  . Alcohol Use: No  . Drug Use: No  . Sexually Active: Not on file   Other Topics Concern  . Not on file   Social History Narrative  . No narrative on file    Family History  Problem Relation Age of Onset  . Diabetes Father     . Heart disease Father   . Stroke Maternal Grandmother   . Stroke Maternal Grandfather   . Hypertension Paternal Grandfather   . Lung cancer Paternal Grandfather   . Cancer Paternal Grandfather     Current Outpatient Prescriptions  Medication Sig Dispense Refill  . aspirin EC 81 MG tablet Take 81 mg by mouth daily.      [name] atenolol (TENORMIN) 100 MG tablet Take 100 mg by mouth every evening.       . capecitabine (XELODA) 500 MG tablet Take 4 tabs (= 2000mg ) every AM then take 5 tabs (= 2500mg ) every PM X 14 days.  Total daily dosage =4500mg .  126 tablet  2  . doxazosin (CARDURA) 2 MG tablet Take 2 mg by mouth at bedtime.      . Ferrous Sulfate Dried 200 (65 FE) MG TABS Take 1 tab twice daily      . levothyroxine (SYNTHROID, LEVOTHROID) 25 MCG tablet Take 25 mcg by mouth daily with breakfast.       . Multiple Vitamin (MULITIVITAMIN WITH MINERALS) TABS Take 1 tablet by mouth daily.      . prochlorperazine (COMPAZINE) 10 MG tablet Take 1 tablet (10 mg total) by mouth every 6 (six) hours as needed (nausea).  30 tablet  0  . simvastatin (ZOCOR) 40 MG tablet Take 40 mg by mouth every evening.      . warfarin (COUMADIN) 5 MG tablet Take 1 tablet (5 mg total) by mouth daily. Or as directed  30 tablet  2     No Known Allergies  BP 144/78  Pulse 68  Temp 97.9 F (36.6 C) (Temporal)  Resp 16  Ht 6' (1.829 m)  Wt 350 lb 6.4 oz (158.94 kg)  BMI 47.52 kg/m2  No results found.   Review of Systems  Constitutional: Negative for fever, chills and diaphoresis.  HENT: Negative for sore throat, trouble swallowing and neck pain.   Eyes: Negative for photophobia and visual disturbance.  Respiratory: Negative for choking and shortness of breath.   Cardiovascular: Negative for chest pain and palpitations.  Gastrointestinal: Negative for nausea, vomiting, abdominal pain, diarrhea, constipation, blood in stool, abdominal distention, anal bleeding and rectal pain.  Genitourinary: Negative for  dysuria, urgency, difficulty urinating and testicular pain.  Musculoskeletal: Negative for myalgias, arthralgias and gait problem.  Skin: Negative for color change and rash.  Neurological: Negative for dizziness, speech difficulty, weakness and numbness.  Hematological: Negative for adenopathy.  Psychiatric/Behavioral: Negative for hallucinations, confusion and agitation.       Objective:   Physical Exam  Constitutional: He is oriented to person, place, and time. He appears well-developed and well-nourished. No distress.  HENT:  Head: Normocephalic.  Mouth/Throat: Oropharynx is clear and moist. No oropharyngeal exudate.  Eyes: Conjunctivae normal and EOM are normal. Pupils are equal, round, and reactive to light. No scleral icterus.  Neck: Normal  range of motion. No tracheal deviation present.  Cardiovascular: Normal rate, normal heart sounds and intact distal pulses.   Pulmonary/Chest: Effort normal. No respiratory distress.  Abdominal: Soft. Bowel sounds are normal. He exhibits no distension, no pulsatile liver and no mass. There is no tenderness. There is no rebound and no guarding. No hernia. Hernia confirmed negative in the right inguinal area and confirmed negative in the left inguinal area.         Incisions clean with normal healing ridges.  No hernias  Genitourinary: Penis normal. No penile tenderness.       Exam done with assistance of male Medical Assistant in the room.  Perianal skin clean with good hygiene.  No pruritis.  No external skin tags / hemorrhoids of significance.  No pilonidal disease.  No fissure.  No abscess/fistula.   Tolerates digital and anoscopic rectal exam.  Normal sphincter tone.  No rectal masses.  Hemorrhoidal piles  WNL  Circular staple line at tip of finger.  Mild narrowing but otherwise smooth.   Musculoskeletal: Normal range of motion. He exhibits no tenderness.  Neurological: He is alert and oriented to person, place, and time. No cranial nerve  deficit. He exhibits normal muscle tone. Coordination normal.  Skin: Skin is warm and dry. No rash noted. He is not diaphoretic.  Psychiatric: He has a normal mood and affect. His behavior is normal.       Assessment:     T3N0 mid/prox rectal cancer  Post-op seroma resolved   Plan:     -RTC to see me 4 months  Surgical follow-up after low rectal cancer resection: -Rectal exam Q6 months 1st two years after surgery -Colonoscopy at end of 1st year (& probable 4th year) -Rectal exam annually 3rd-5th years  -postadj oral Xeloda & XRT per Med/Rad Onc.  Nearly done w Xeloda  -Increase activity as tolerated.  Do not push through pain.  -bowel regimen to avoid problems.  -Pt expressed understanding and appreciation

## 2012-06-04 NOTE — Patient Instructions (Signed)
GETTING TO GOOD BOWEL HEALTH. Irregular bowel habits such as constipation and diarrhea can lead to many problems over time.  Having one soft bowel movement a day is the most important way to prevent further problems.  The anorectal canal is designed to handle stretching and feces to safely manage our ability to get rid of solid waste (feces, poop, stool) out of our body.  BUT, hard constipated stools can act like ripping concrete bricks and diarrhea can be a burning fire to this very sensitive area of our body, causing inflamed hemorrhoids, anal fissures, increasing risk is perirectal abscesses, abdominal pain/bloating, an making irritable bowel worse.     The goal: ONE SOFT BOWEL MOVEMENT A DAY!  To have soft, regular bowel movements:    Drink at least 8 tall glasses of water a day.     Take plenty of fiber.  Fiber is the undigested part of plant food that passes into the colon, acting s "natures broom" to encourage bowel motility and movement.  Fiber can absorb and hold large amounts of water. This results in a larger, bulkier stool, which is soft and easier to pass. Work gradually over several weeks up to 6 servings a day of fiber (25g a day even more if needed) in the form of: o Vegetables -- Root (potatoes, carrots, turnips), leafy green (lettuce, salad greens, celery, spinach), or cooked high residue (cabbage, broccoli, etc) o Fruit -- Fresh (unpeeled skin & pulp), Dried (prunes, apricots, cherries, etc ),  or stewed ( applesauce)  o Whole grain breads, pasta, etc (whole wheat)  o Bran cereals    Bulking Agents -- This type of water-retaining fiber generally is easily obtained each day by one of the following:  o Psyllium bran -- The psyllium plant is remarkable because its ground seeds can retain so much water. This product is available as Metamucil, Konsyl, Effersyllium, Per Diem Fiber, or the less expensive generic preparation in drug and health food stores. Although labeled a laxative, it really  is not a laxative.  o Methylcellulose -- This is another fiber derived from wood which also retains water. It is available as Citrucel. o Polyethylene Glycol - and "artificial" fiber commonly called Miralax or Glycolax.  It is helpful for people with gassy or bloated feelings with regular fiber o Flax Seed - a less gassy fiber than psyllium   No reading or other relaxing activity while on the toilet. If bowel movements take longer than 5 minutes, you are too constipated   AVOID CONSTIPATION.  High fiber and water intake usually takes care of this.  Sometimes a laxative is needed to stimulate more frequent bowel movements, but    Laxatives are not a good long-term solution as it can wear the colon out. o Osmotics (Milk of Magnesia, Fleets phosphosoda, Magnesium citrate, MiraLax, GoLytely) are safer than  o Stimulants (Senokot, Castor Oil, Dulcolax, Ex Lax)    o Do not take laxatives for more than 7days in a row.    IF SEVERELY CONSTIPATED, try a Bowel Retraining Program: o Do not use laxatives.  o Eat a diet high in roughage, such as bran cereals and leafy vegetables.  o Drink six (6) ounces of prune or apricot juice each morning.  o Eat two (2) large servings of stewed fruit each day.  o Take one (1) heaping tablespoon of a psyllium-based bulking agent twice a day. Use sugar-free sweetener when possible to avoid excessive calories.  o Eat a normal breakfast.  o   Set aside 15 minutes after breakfast to sit on the toilet, but do not strain to have a bowel movement.  o If you do not have a bowel movement by the third day, use an enema and repeat the above steps.    Controlling diarrhea o Switch to liquids and simpler foods for a few days to avoid stressing your intestines further. o Avoid dairy products (especially milk & ice cream) for a short time.  The intestines often can lose the ability to digest lactose when stressed. o Avoid foods that cause gassiness or bloating.  Typical foods include  beans and other legumes, cabbage, broccoli, and dairy foods.  Every person has some sensitivity to other foods, so listen to our body and avoid those foods that trigger problems for you. o Adding fiber (Citrucel, Metamucil, psyllium, Miralax) gradually can help thicken stools by absorbing excess fluid and retrain the intestines to act more normally.  Slowly increase the dose over a few weeks.  Too much fiber too soon can backfire and cause cramping & bloating. o Probiotics (such as active yogurt, Align, etc) may help repopulate the intestines and colon with normal bacteria and calm down a sensitive digestive tract.  Most studies show it to be of mild help, though, and such products can be costly. o Medicines:   Bismuth subsalicylate (ex. Kayopectate, Pepto Bismol) every 30 minutes for up to 6 doses can help control diarrhea.  Avoid if pregnant.   Loperamide (Immodium) can slow down diarrhea.  Start with two tablets (4mg total) first and then try one tablet every 6 hours.  Avoid if you are having fevers or severe pain.  If you are not better or start feeling worse, stop all medicines and call your doctor for advice o Call your doctor if you are getting worse or not better.  Sometimes further testing (cultures, endoscopy, X-ray studies, bloodwork, etc) may be needed to help diagnose and treat the cause of the diarrhea.  Colorectal Cancer Colorectal cancer is an abnormal growth of tissue (tumor) in the colon or rectum that is cancerous (malignant). Unlike noncancerous (benign) tumors, malignant tumors can spread to other parts of your body. The colon is the large bowel or large intestine. The rectum is the last several inches of the colon. CAUSES  The exact cause of colon cancer is unknown.  RISK FACTORS The majority of patients do not have identifiable risk factors, but the following factors may increase your chances of getting colon cancer:  Age. Most colorectal cancers occur in people older than 50  years.  Having abnormal growths (polyps) on the inner wall of the colon or rectum.  Diabetes.  Being African American.  Family history of hereditary nonpolyposis colon cancer. This condition is caused by changes in the genes that are responsible for repairing mismatched DNA.  Family history of familial adenomatous polyposis (FAP). This is a rare, inherited condition in which hundreds of polyps form in the colon and rectum. It is caused by a change in the APC gene. Unless FAP is treated, it usually leads to colorectal cancer by age 40.  Personal history of cancer. A person who has already had colorectal cancer may develop it a second time. Also, women with a history of ovarian, uterine, or breast cancer are at a somewhat higher risk of developing colorectal cancer.  Inflammatory bowel disease, including ulcerative colitis and Crohn's disease.  Being obese or eating a diet that is high in fat (especially animal fat) and low in fiber,   fruits, and vegetables.  Smoking. A person who smokes cigarettes may be at increased risk of developing polyps and colorectal cancer.  Heavy alcohol use. SYMPTOMS Early colorectal cancer often does not cause symptoms. As the cancer grows, symptoms may include:  Diarrhea.  Constipation.  Feeling like the bowel does not empty completely after a bowel movement.  Blood in the stool.  Stools that are narrower than usual.  Abdominal discomfort, pain, bloating, fullness, or cramps.  Unexplained weight loss.  Constant tiredness.  Nausea and vomiting. DIAGNOSIS  Your caregiver will ask about your medical history. He or she may also perform a number of procedures, such as:  A physical exam.  Blood tests. This may include a routine complete blood count and iron level testing. Your caregiver may also check for carcinoembryonic antigen (CEA) and other substances in the blood. Some people who have colorectal cancer have a high CEA level. These levels may be  used to follow the activity of your colon cancer.  Chest X-rays, computed tomography (CT) scans, or magnetic resonance imaging (MRI).  Taking a tissue sample (biopsy) from the colon or rectum. The sample is examined under a microscope to look for cancer cells.  Sigmoidoscopy. With this test, your caregiver can see inside your colon. A thin flexible tube (sigmoidoscope) is placed into your rectum. This device has a light source and a tiny video camera in it. Your caregiver uses the sigmoidoscope to look at the last third of your colon.  Colonoscopy. This test is like sigmoidoscopy, but your caregiver looks at the entire colon. This test usually requires medicine that helps you relax (sedative).  Endorectal ultrasound. With this test, your caregiver can see how deep a rectal tumor has grown and whether the cancer has spread to lymph nodes or other nearby tissues. A tool (probe) is inserted into the rectum. The probe sends out sound waves to the rectum and nearby tissues, and a computer uses the echoes to create a picture. Your cancer will be staged to determine its severity and extent. Staging is a careful attempt to find out the size of the tumor, whether the cancer has spread, and if so, to what parts of the body. You may need to have more tests to determine the stage of your cancer. The test results will help determine what treatment plan is best for you. STAGES  Stage 0. The cancer is found only in the innermost lining of the colon or rectum.  Stage I. The cancer has grown into the inner wall of the colon or rectum. The cancer has not yet reached the outer wall of the colon.  Stage II. The cancer extends more deeply into or through the wall of the colon or rectum. It may have invaded nearby tissue, but cancer cells have not spread to the lymph nodes.  Stage III. The cancer has spread to nearby lymph nodes but not to other parts of the body.  Stage IV. The cancer has spread to other parts of  the body, such as the liver or lungs. Your caregiver may tell you the detailed stage of your cancer. In that case, the stage will include both a number and a letter. TREATMENT  Depending on the type and stage, colorectal cancer may be treated with surgery, radiation therapy, or chemotherapy. Some patients have a combination of these therapies.  Surgery may be done to remove the polyps from your colon. In early stages, your caregiver may be able to do this during a colonoscopy.   In later stages, surgery may be done to remove part of your colon.  Radiation therapy uses high-energy rays to kill cancer cells. This is usually recommended for patients with rectal cancer.  Chemotherapy is the use of drugs to kill cancer cells. Caregivers also give chemotherapy to help reduce pain and other problems caused by colorectal cancer. This may be done even if the cancer is not curable. HOME CARE INSTRUCTIONS   Only take over-the-counter or prescription medicines for pain, discomfort, or fever as directed by your caregiver.  Maintain a healthy diet.  Consider joining a support group. This may help you learn to cope with the stress of having colorectal cancer.  Seek advice to help you manage treatment side effects.  Keep all follow-up appointments as directed by your caregiver.  Inform your cancer specialist if you are admitted to the hospital. SEEK IMMEDIATE MEDICAL CARE IF:   Your diarrhea or constipation does not go away.  You have alternating constipation and diarrhea.  You have blood in your stools.  Your abdominal pain gets worse.  You lose weight without trying.  You notice new fatigue or weakness.  You develop a fever during chemotherapy treatment. Document Released: 07/23/2005 Document Revised: 10/15/2011 Document Reviewed: 07/10/2011 Exit[employer] Patient Information 2013 [employer].  

## 2012-06-09 ENCOUNTER — Other Ambulatory Visit (HOSPITAL_BASED_OUTPATIENT_CLINIC_OR_DEPARTMENT_OTHER): Payer: Medicaid Other | Admitting: Lab

## 2012-06-09 ENCOUNTER — Telehealth: Payer: Self-pay | Admitting: *Deleted

## 2012-06-09 DIAGNOSIS — I2699 Other pulmonary embolism without acute cor pulmonale: Secondary | ICD-10-CM

## 2012-06-09 DIAGNOSIS — I82409 Acute embolism and thrombosis of unspecified deep veins of unspecified lower extremity: Secondary | ICD-10-CM

## 2012-06-09 LAB — PROTIME-INR: Protime: 22.8 Seconds — ABNORMAL HIGH (ref 10.6–13.4)

## 2012-06-09 NOTE — Telephone Encounter (Signed)
Patient advised that per lab results 06/09/2012, patient to resume Coumadin at same dose: 2.5mg /day.  Labs to be re-checked on 06/16/2012 per Dr. Truett Perna.  POF submitted, patient aware and verbalized understanding.

## 2012-06-10 ENCOUNTER — Telehealth: Payer: Self-pay | Admitting: Oncology

## 2012-06-10 NOTE — Telephone Encounter (Signed)
s.w. pt and advised on 11.11.13 appt....pt aware

## 2012-06-16 ENCOUNTER — Other Ambulatory Visit (HOSPITAL_BASED_OUTPATIENT_CLINIC_OR_DEPARTMENT_OTHER): Payer: Medicaid Other | Admitting: Lab

## 2012-06-16 DIAGNOSIS — I2699 Other pulmonary embolism without acute cor pulmonale: Secondary | ICD-10-CM

## 2012-06-18 ENCOUNTER — Telehealth: Payer: Self-pay | Admitting: *Deleted

## 2012-06-18 NOTE — Telephone Encounter (Signed)
Made patient aware to continue 2.5mg  coumadin daily. Recheck tomorrow as scheduled.

## 2012-06-18 NOTE — Telephone Encounter (Signed)
Message copied by Wandalee Ferdinand on Wed Jun 18, 2012  1:00 PM ------      Message from: [name]      Created: Tue Jun 17, 2012  8:54 PM       Please call patient, same coumadin, check  PT next visit

## 2012-06-19 ENCOUNTER — Other Ambulatory Visit (HOSPITAL_BASED_OUTPATIENT_CLINIC_OR_DEPARTMENT_OTHER): Payer: Medicaid Other | Admitting: Lab

## 2012-06-19 ENCOUNTER — Telehealth: Payer: Self-pay | Admitting: Oncology

## 2012-06-19 ENCOUNTER — Ambulatory Visit (HOSPITAL_BASED_OUTPATIENT_CLINIC_OR_DEPARTMENT_OTHER): Payer: Medicaid Other | Admitting: Oncology

## 2012-06-19 VITALS — BP 154/73 | HR 60 | Temp 97.7°F | Resp 18 | Ht 72.0 in | Wt 350.1 lb

## 2012-06-19 DIAGNOSIS — N289 Disorder of kidney and ureter, unspecified: Secondary | ICD-10-CM

## 2012-06-19 DIAGNOSIS — I2699 Other pulmonary embolism without acute cor pulmonale: Secondary | ICD-10-CM

## 2012-06-19 DIAGNOSIS — C20 Malignant neoplasm of rectum: Secondary | ICD-10-CM

## 2012-06-19 DIAGNOSIS — D6959 Other secondary thrombocytopenia: Secondary | ICD-10-CM

## 2012-06-19 DIAGNOSIS — Z86711 Personal history of pulmonary embolism: Secondary | ICD-10-CM

## 2012-06-19 DIAGNOSIS — Z86718 Personal history of other venous thrombosis and embolism: Secondary | ICD-10-CM

## 2012-06-19 LAB — COMPREHENSIVE METABOLIC PANEL (CC13)
AST: 33 U/L (ref 5–34)
Albumin: 3.5 g/dL (ref 3.5–5.0)
BUN: 19 mg/dL (ref 7.0–26.0)
Calcium: 9.1 mg/dL (ref 8.4–10.4)
Chloride: 109 mEq/L — ABNORMAL HIGH (ref 98–107)
Potassium: 4.1 mEq/L (ref 3.5–5.1)

## 2012-06-19 LAB — PROTIME-INR: INR: 2 (ref 2.00–3.50)

## 2012-06-19 LAB — CBC WITH DIFFERENTIAL/PLATELET
Basophils Absolute: 0 10*3/uL (ref 0.0–0.1)
Eosinophils Absolute: 0.1 10*3/uL (ref 0.0–0.5)
HCT: 37.3 % — ABNORMAL LOW (ref 38.4–49.9)
HGB: 13.5 g/dL (ref 13.0–17.1)
LYMPH%: 13.3 % — ABNORMAL LOW (ref 14.0–49.0)
MCV: 96.9 fL (ref 79.3–98.0)
MONO%: 7.9 % (ref 0.0–14.0)
NEUT#: 3.3 10*3/uL (ref 1.5–6.5)
Platelets: 113 10*3/uL — ABNORMAL LOW (ref 140–400)
RDW: 16.8 % — ABNORMAL HIGH (ref 11.0–14.6)

## 2012-06-19 NOTE — Telephone Encounter (Signed)
appts made and printed for pt aom °

## 2012-06-20 ENCOUNTER — Other Ambulatory Visit: Payer: Self-pay | Admitting: *Deleted

## 2012-06-20 NOTE — Progress Notes (Signed)
   [facility]    OFFICE PROGRESS NOTE   INTERVAL HISTORY:   He returns as scheduled. He will be due to begin another cycle of Xeloda on 06/24/2012. He reports tolerating the Xeloda well. No mouth sores, diarrhea, or hand/foot pain. No bleeding. He continues Coumadin.  Objective:  Vital signs in last 24 hours:  Blood pressure 154/73, pulse 60, temperature 97.7 F (36.5 C), temperature source Oral, resp. rate 18, height 6' (1.829 m), weight 350 lb 1.6 oz (158.804 kg).    HEENT: No thrush or ulcers Resp: Lungs clear bilaterally Cardio:  Regular rate and rhythm GI: No hepatomegaly, nontender Vascular: Chronic stasis change at the low leg bilaterally with trace edema  Skin: Palms without erythema or skin breakdown     Lab Results:  Lab Results  Component Value Date   WBC 4.4 06/19/2012   HGB 13.5 06/19/2012   HCT 37.3* 06/19/2012   MCV 96.9 06/19/2012   PLT 113* 06/19/2012   ANC 3.3 PT/INR 2.0   Medications: I have reviewed the patient's current medications.  Assessment/Plan: 1. Rectal cancer, stage II (pT3 pN0) status post low anterior resection 12/14/2011. The tumor was noted to be at 10 cm on a rigid proctoscopy at the time of surgery. He completed radiation and Xeloda from 01/31/2012-03/11/2012. He began a first cycle of adjuvant Xeloda on 04/22/2012. He completed cycle  beginning 06/03/2012 2. History of iron deficiency anemia likely secondary to rectal bleeding. Resolved. 3. 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 He is maintained on Coumadin. The PT/INR is therapeutic.  4. Renal insufficiency. No dose adjustment needed on Xeloda per consultation with the cancer Center pharmacy. 5. Hypertension. 6. 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  Disposition:  He appears to be tolerating the Xeloda well. The plan is to begin cycle 4 of adjuvant Xeloda on 06/24/2012. He will return for a PT/INR on 07/01/2012 and an office visit on  07/11/2012. He no longer has Medicaid. It will be difficult for him to obtain the Xeloda. We will check into assistance programs.   Thornton Papas, MD  06/20/2012  8:54 AM

## 2012-06-23 ENCOUNTER — Encounter: Payer: Self-pay | Admitting: Oncology

## 2012-06-23 NOTE — Progress Notes (Signed)
Richard Warner has a high deductible to meet before his medicaid is reactivated.  I am applying for free xeloda through the drug company; he will bring me his proof of income.

## 2012-06-24 ENCOUNTER — Encounter: Payer: Self-pay | Admitting: Oncology

## 2012-06-24 NOTE — Progress Notes (Signed)
Per medicaid case worker deductible [phone]. Checked with Delice Bison to see if patient had any hospital bills. There is one for May 1556.00. No others. There are no bills to help go toward the deductible of [phone] as of today. I will check to see if patient has any other bills from outside of [facility] that can help toward the deductible. If so he needs to submit those so he can get the medicaid reactivated.

## 2012-06-27 ENCOUNTER — Encounter: Payer: Self-pay | Admitting: Oncology

## 2012-06-27 NOTE — Progress Notes (Signed)
Patient is approved for free xeloda through [place] from 06/25/12-06/25/13.

## 2012-06-27 NOTE — Progress Notes (Signed)
Patient's social worker is Ms. Holliway [phone]. See notes of patient having the high deductible so the medicaid has been suspended.

## 2012-07-01 ENCOUNTER — Encounter: Payer: Self-pay | Admitting: Oncology

## 2012-07-01 ENCOUNTER — Other Ambulatory Visit (HOSPITAL_BASED_OUTPATIENT_CLINIC_OR_DEPARTMENT_OTHER): Payer: Self-pay | Admitting: Lab

## 2012-07-01 DIAGNOSIS — I2699 Other pulmonary embolism without acute cor pulmonale: Secondary | ICD-10-CM

## 2012-07-01 LAB — PROTIME-INR: Protime: 28.8 Seconds — ABNORMAL HIGH (ref 10.6–13.4)

## 2012-07-01 NOTE — Progress Notes (Signed)
Patient came in and said he doesn't have any bills right now he can submit to go toward his ded with medicaid. He said he will be able to get medicare in May and not sure if he will meet. He is doing real well!! He did say he may have 3 he can submit- He said was told after 06/06/12 bills are the ones he needed to submit.

## 2012-07-04 ENCOUNTER — Telehealth: Payer: Self-pay | Admitting: *Deleted

## 2012-07-04 NOTE — Telephone Encounter (Signed)
Message copied by Wandalee Ferdinand on Fri Jul 04, 2012  3:24 PM ------      Message from: Thornton Papas B      Created: Wed Jul 02, 2012  8:02 PM       Please call patient, same coumadin, check PT 12/6

## 2012-07-04 NOTE — Telephone Encounter (Signed)
Left VM to stay on same coumadin dose and will recheck on 12/6

## 2012-07-11 ENCOUNTER — Encounter: Payer: Self-pay | Admitting: Oncology

## 2012-07-11 ENCOUNTER — Other Ambulatory Visit (HOSPITAL_BASED_OUTPATIENT_CLINIC_OR_DEPARTMENT_OTHER): Payer: Self-pay | Admitting: Lab

## 2012-07-11 ENCOUNTER — Telehealth: Payer: Self-pay | Admitting: Oncology

## 2012-07-11 ENCOUNTER — Ambulatory Visit (HOSPITAL_BASED_OUTPATIENT_CLINIC_OR_DEPARTMENT_OTHER): Payer: Self-pay | Admitting: Nurse Practitioner

## 2012-07-11 VITALS — BP 142/80 | HR 61 | Temp 98.1°F | Resp 18 | Ht 72.0 in | Wt 350.4 lb

## 2012-07-11 DIAGNOSIS — I2699 Other pulmonary embolism without acute cor pulmonale: Secondary | ICD-10-CM

## 2012-07-11 DIAGNOSIS — C20 Malignant neoplasm of rectum: Secondary | ICD-10-CM

## 2012-07-11 DIAGNOSIS — I82409 Acute embolism and thrombosis of unspecified deep veins of unspecified lower extremity: Secondary | ICD-10-CM

## 2012-07-11 LAB — CBC WITH DIFFERENTIAL/PLATELET
BASO%: 0.2 % (ref 0.0–2.0)
EOS%: 2.2 % (ref 0.0–7.0)
LYMPH%: 12 % — ABNORMAL LOW (ref 14.0–49.0)
MCH: 35.2 pg — ABNORMAL HIGH (ref 27.2–33.4)
MCHC: 36.2 g/dL — ABNORMAL HIGH (ref 32.0–36.0)
MONO#: 0.4 10*3/uL (ref 0.1–0.9)
MONO%: 8.7 % (ref 0.0–14.0)
Platelets: 121 10*3/uL — ABNORMAL LOW (ref 140–400)
RBC: 3.81 10*6/uL — ABNORMAL LOW (ref 4.20–5.82)
WBC: 4.2 10*3/uL (ref 4.0–10.3)

## 2012-07-11 LAB — PROTIME-INR: Protime: 20.4 Seconds — ABNORMAL HIGH (ref 10.6–13.4)

## 2012-07-11 NOTE — Telephone Encounter (Signed)
gv and printed pt appt schedule for Dec °

## 2012-07-11 NOTE — Progress Notes (Signed)
Patient bought in [employer]. He is overqualified. 1 family. Total income $30080-- pov guidelines [id number]. ($20580/ssi + $9500/savings account)=[phone]. I will send overqualified letter and note files. All documents are attached.

## 2012-07-11 NOTE — Progress Notes (Signed)
OFFICE PROGRESS NOTE  Interval history:  Mr. Fieldhouse returns as scheduled. He completed cycle 4 adjuvant Xeloda beginning 06/27/2012. He denies nausea/vomiting. He did experience an "upset stomach" periodically. He denies diarrhea. No mouth sores. No hand or foot pain or redness. He continues Coumadin. No bleeding.   Objective: Blood pressure 142/80, pulse 61, temperature 98.1 F (36.7 C), temperature source Oral, resp. rate 18, height 6' (1.829 m), weight 350 lb 6.4 oz (158.94 kg).  Oropharynx is without thrush or ulceration. Lungs are clear. Regular cardiac rhythm. Abdomen is soft and nontender. Obese. No obvious hepatomegaly. Trace edema at the lower legs bilaterally. Chronic stasis changes. Palms are without erythema or skin breakdown.  Lab Results: Lab Results  Component Value Date   WBC 4.2 07/11/2012   HGB 13.4 07/11/2012   HCT 37.0* 07/11/2012   MCV 97.1 07/11/2012   PLT 121* 07/11/2012    Chemistry:    Chemistry      Component Value Date/Time   NA 141 06/19/2012 0838   NA 137 02/22/2012 1222   K 4.1 06/19/2012 0838   K 4.2 02/22/2012 1222   CL 109* 06/19/2012 0838   CL 105 02/22/2012 1222   CO2 24 06/19/2012 0838   CO2 22 02/22/2012 1222   BUN 19.0 06/19/2012 0838   BUN 18 02/22/2012 1222   CREATININE 1.4* 06/19/2012 0838   CREATININE 1.41* 02/22/2012 1222      Component Value Date/Time   CALCIUM 9.1 06/19/2012 0838   CALCIUM 9.2 02/22/2012 1222   ALKPHOS 62 06/19/2012 0838   ALKPHOS 59 02/22/2012 1222   AST 33 06/19/2012 0838   AST 30 02/22/2012 1222   ALT 38 06/19/2012 0838   ALT 31 02/22/2012 1222   BILITOT 0.85 06/19/2012 0838   BILITOT 0.7 02/22/2012 1222       Studies/Results: No results found.  Medications: I have reviewed the patient's current medications.  Assessment/Plan:  1. Rectal cancer, stage II (pT3 pN0) status post low anterior resection 12/14/2011. The tumor was noted to be at 10 cm on a rigid proctoscopy at the time of surgery. He completed  radiation and Xeloda from 01/31/2012-03/11/2012. He began a first cycle of adjuvant Xeloda on 04/22/2012. He completed cycle 4 beginning 06/27/2012. 2. History of iron deficiency anemia likely secondary to rectal bleeding. Resolved. 3. 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 He is maintained on Coumadin. He will continue at the current dose and return for a followup PT/INR on 07/25/2012.  4. Renal insufficiency. No dose adjustment needed on Xeloda per consultation with the cancer Center pharmacy. 5. Hypertension. 6. 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 Stable.  Disposition-Richard Warner appears stable. He has completed 4 cycles of adjuvant Xeloda chemotherapy. He will begin cycle 5 on 07/17/2012. He will return for a followup PT/INR on 07/25/2012 and a followup visit on 08/01/2012. He will contact the office in the interim with any problems.  Plan reviewed with Dr. Truett Perna.   Lonna Cobb ANP/GNP-BC

## 2012-07-25 ENCOUNTER — Other Ambulatory Visit: Payer: Self-pay | Admitting: Lab

## 2012-07-25 ENCOUNTER — Other Ambulatory Visit: Payer: Self-pay | Admitting: Oncology

## 2012-07-25 DIAGNOSIS — I82409 Acute embolism and thrombosis of unspecified deep veins of unspecified lower extremity: Secondary | ICD-10-CM

## 2012-07-25 DIAGNOSIS — Z7901 Long term (current) use of anticoagulants: Secondary | ICD-10-CM

## 2012-07-25 DIAGNOSIS — I2699 Other pulmonary embolism without acute cor pulmonale: Secondary | ICD-10-CM

## 2012-07-25 LAB — PROTIME-INR: Protime: 22.8 Seconds — ABNORMAL HIGH (ref 10.6–13.4)

## 2012-07-29 ENCOUNTER — Telehealth: Payer: Self-pay | Admitting: *Deleted

## 2012-07-29 NOTE — Telephone Encounter (Signed)
Called pt per Dr Truett Perna note below. Instructed to stay on same dose of coumadin and recheck PT on 12/27.Pt verbalized understanding.

## 2012-07-29 NOTE — Telephone Encounter (Signed)
Message copied by Phillis Knack on Tue Jul 29, 2012 10:19 AM ------      Message from: Wandalee Ferdinand      Created: Tue Jul 29, 2012 10:11 AM                   ----- Message -----         From: [name], MD         Sent: 07/26/2012   8:33 PM           To: Wandalee Ferdinand, RN, Glori Luis, RN, #            Please call patient, same coumadin, check PT 12/27

## 2012-08-01 ENCOUNTER — Telehealth: Payer: Self-pay | Admitting: Oncology

## 2012-08-01 ENCOUNTER — Ambulatory Visit (HOSPITAL_BASED_OUTPATIENT_CLINIC_OR_DEPARTMENT_OTHER): Payer: Self-pay | Admitting: Oncology

## 2012-08-01 ENCOUNTER — Other Ambulatory Visit (HOSPITAL_BASED_OUTPATIENT_CLINIC_OR_DEPARTMENT_OTHER): Payer: Self-pay | Admitting: Lab

## 2012-08-01 VITALS — BP 169/74 | HR 63 | Temp 98.0°F | Resp 18 | Ht 72.0 in | Wt 350.5 lb

## 2012-08-01 DIAGNOSIS — C20 Malignant neoplasm of rectum: Secondary | ICD-10-CM

## 2012-08-01 DIAGNOSIS — I2699 Other pulmonary embolism without acute cor pulmonale: Secondary | ICD-10-CM

## 2012-08-01 DIAGNOSIS — Z7901 Long term (current) use of anticoagulants: Secondary | ICD-10-CM

## 2012-08-01 DIAGNOSIS — N289 Disorder of kidney and ureter, unspecified: Secondary | ICD-10-CM

## 2012-08-01 LAB — COMPREHENSIVE METABOLIC PANEL (CC13)
ALT: 53 U/L (ref 0–55)
AST: 47 U/L — ABNORMAL HIGH (ref 5–34)
CO2: 23 mEq/L (ref 22–29)
Chloride: 106 mEq/L (ref 98–107)
Creatinine: 1.4 mg/dL — ABNORMAL HIGH (ref 0.7–1.3)
Sodium: 142 mEq/L (ref 136–145)
Total Bilirubin: 1.24 mg/dL — ABNORMAL HIGH (ref 0.20–1.20)
Total Protein: 6.2 g/dL — ABNORMAL LOW (ref 6.4–8.3)

## 2012-08-01 LAB — CBC WITH DIFFERENTIAL/PLATELET
Basophils Absolute: 0 10*3/uL (ref 0.0–0.1)
Eosinophils Absolute: 0.1 10*3/uL (ref 0.0–0.5)
HCT: 38.4 % (ref 38.4–49.9)
HGB: 13.8 g/dL (ref 13.0–17.1)
LYMPH%: 12.8 % — ABNORMAL LOW (ref 14.0–49.0)
MCV: 102.4 fL — ABNORMAL HIGH (ref 79.3–98.0)
MONO%: 9.7 % (ref 0.0–14.0)
NEUT#: 3.2 10*3/uL (ref 1.5–6.5)
Platelets: 119 10*3/uL — ABNORMAL LOW (ref 140–400)

## 2012-08-01 LAB — PROTIME-INR: INR: 2 (ref 2.00–3.50)

## 2012-08-01 NOTE — Telephone Encounter (Signed)
gv and printed appt schedule to pt for Jan 2014

## 2012-08-01 NOTE — Progress Notes (Signed)
   [facility]    OFFICE PROGRESS NOTE   INTERVAL HISTORY:   She returns as scheduled. He completed another cycle of capecitabine yesterday. No mouth sores, nausea, diarrhea, or hand/foot pain. He continues Coumadin anticoagulation the  Objective:  Vital signs in last 24 hours:  Blood pressure 169/74, pulse 63, temperature 98 F (36.7 C), temperature source Oral, resp. rate 18, height 6' (1.829 m), weight 350 lb 8 oz (158.986 kg).    HEENT: No thrush or ulcers Resp: Lungs clear bilaterally Cardio: Regular rate and rhythm GI: No hepatosplenomegaly, nontender Vascular: Chronic stasis change at the low leg bilaterally, no leg edema  Skin: Skin thickening and mild dry superficial desquamation at the thumb bilaterally    Lab Results:  Lab Results  Component Value Date   WBC 4.4 08/01/2012   HGB 13.8 08/01/2012   HCT 38.4 08/01/2012   MCV 102.4* 08/01/2012   PLT 119* 08/01/2012   ANC 3.2  PT/INR 2.0   Medications: I have reviewed the patient's current medications.  Assessment/Plan: 1. Rectal cancer, stage II (pT3 pN0) status post low anterior resection 12/14/2011. The tumor was noted to be at 10 cm on a rigid proctoscopy at the time of surgery. He completed radiation and Xeloda from 01/31/2012-03/11/2012. He began a first cycle of adjuvant Xeloda on 04/22/2012. He completed cycle 5 beginning 07/17/2012. 2. History of iron deficiency anemia likely secondary to rectal bleeding. Resolved. 3. 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 He is maintained on Coumadin. He will continue at the current dose and return for a followup PT/INR on 08/12/2011. 4. Renal insufficiency. No dose adjustment needed on Xeloda per consultation with the cancer Center pharmacy. 5. Hypertension. 6. 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 Stable.  Disposition:  He appears stable. The plan is to begin the final cycle of active Xeloda on 08/09/2011. He will return for  a PT/INR checks on January 6 and January 17. He is scheduled for an office visit on 09/04/2012.   Thornton Papas, MD  08/01/2012  6:18 PM

## 2012-08-08 ENCOUNTER — Telehealth: Payer: Self-pay | Admitting: *Deleted

## 2012-08-08 NOTE — Telephone Encounter (Signed)
Victorino Dike from [employer] called stating that they were putting pt Xeloda on hold due to no insurance.  Called and spoke with Jennifer---informed per Axel Filler note on 06/27/12  Patient is approved for free xeloda through [place] from 06/25/12-06/25/13.

## 2012-08-11 ENCOUNTER — Other Ambulatory Visit (HOSPITAL_BASED_OUTPATIENT_CLINIC_OR_DEPARTMENT_OTHER): Payer: Self-pay | Admitting: Lab

## 2012-08-11 DIAGNOSIS — C20 Malignant neoplasm of rectum: Secondary | ICD-10-CM

## 2012-08-11 DIAGNOSIS — Z7901 Long term (current) use of anticoagulants: Secondary | ICD-10-CM

## 2012-08-11 DIAGNOSIS — I2699 Other pulmonary embolism without acute cor pulmonale: Secondary | ICD-10-CM

## 2012-08-11 LAB — PROTIME-INR: Protime: 24 Seconds — ABNORMAL HIGH (ref 10.6–13.4)

## 2012-08-12 ENCOUNTER — Telehealth: Payer: Self-pay | Admitting: *Deleted

## 2012-08-12 NOTE — Telephone Encounter (Signed)
Spoke with pt, instructed him to continue same dose of Coumadin. He voiced understanding.

## 2012-08-12 NOTE — Telephone Encounter (Signed)
Message copied by Caleb Popp on Tue Aug 12, 2012 10:36 AM ------      Message from: Thornton Papas B      Created: Mon Aug 11, 2012  8:55 PM       Please call patient, same coumadin, f/u as scheduled

## 2012-08-22 ENCOUNTER — Other Ambulatory Visit (HOSPITAL_BASED_OUTPATIENT_CLINIC_OR_DEPARTMENT_OTHER): Payer: Self-pay | Admitting: Lab

## 2012-08-22 DIAGNOSIS — C20 Malignant neoplasm of rectum: Secondary | ICD-10-CM

## 2012-08-22 DIAGNOSIS — I2699 Other pulmonary embolism without acute cor pulmonale: Secondary | ICD-10-CM

## 2012-08-22 LAB — PROTIME-INR: INR: 2.5 (ref 2.00–3.50)

## 2012-08-22 LAB — CBC WITH DIFFERENTIAL/PLATELET
Basophils Absolute: 0 10*3/uL (ref 0.0–0.1)
Eosinophils Absolute: 0.1 10*3/uL (ref 0.0–0.5)
HGB: 12.8 g/dL — ABNORMAL LOW (ref 13.0–17.1)
MONO#: 0.4 10*3/uL (ref 0.1–0.9)
NEUT#: 2.8 10*3/uL (ref 1.5–6.5)
RDW: 16.8 % — ABNORMAL HIGH (ref 11.0–14.6)
WBC: 3.8 10*3/uL — ABNORMAL LOW (ref 4.0–10.3)
lymph#: 0.5 10*3/uL — ABNORMAL LOW (ref 0.9–3.3)

## 2012-08-25 ENCOUNTER — Telehealth: Payer: Self-pay | Admitting: *Deleted

## 2012-08-25 NOTE — Telephone Encounter (Signed)
Message copied by Caleb Popp on Mon Aug 25, 2012  2:15 PM ------      Message from: Thornton Papas B      Created: Fri Aug 22, 2012  6:59 PM       Please call patient, same coumadin, f/u as scheduled

## 2012-08-25 NOTE — Telephone Encounter (Signed)
Left message on voicemail for pt to continue same dose of Coumadin. Will check PT INR 09/04/12.

## 2012-09-04 ENCOUNTER — Telehealth: Payer: Self-pay | Admitting: Oncology

## 2012-09-04 ENCOUNTER — Ambulatory Visit (HOSPITAL_BASED_OUTPATIENT_CLINIC_OR_DEPARTMENT_OTHER): Payer: Self-pay | Admitting: Lab

## 2012-09-04 ENCOUNTER — Other Ambulatory Visit (HOSPITAL_BASED_OUTPATIENT_CLINIC_OR_DEPARTMENT_OTHER): Payer: Self-pay | Admitting: Lab

## 2012-09-04 ENCOUNTER — Ambulatory Visit (HOSPITAL_BASED_OUTPATIENT_CLINIC_OR_DEPARTMENT_OTHER): Payer: Self-pay | Admitting: Nurse Practitioner

## 2012-09-04 VITALS — BP 141/76 | HR 74 | Temp 97.4°F | Resp 20 | Ht 72.0 in | Wt 356.1 lb

## 2012-09-04 DIAGNOSIS — I2699 Other pulmonary embolism without acute cor pulmonale: Secondary | ICD-10-CM

## 2012-09-04 DIAGNOSIS — N289 Disorder of kidney and ureter, unspecified: Secondary | ICD-10-CM

## 2012-09-04 DIAGNOSIS — C20 Malignant neoplasm of rectum: Secondary | ICD-10-CM

## 2012-09-04 DIAGNOSIS — D6959 Other secondary thrombocytopenia: Secondary | ICD-10-CM

## 2012-09-04 LAB — CBC WITH DIFFERENTIAL/PLATELET
BASO%: 0 % (ref 0.0–2.0)
EOS%: 2.7 % (ref 0.0–7.0)
MCH: 35.3 pg — ABNORMAL HIGH (ref 27.2–33.4)
MCHC: 35.3 g/dL (ref 32.0–36.0)
MONO%: 11.4 % (ref 0.0–14.0)
NEUT%: 75.4 % — ABNORMAL HIGH (ref 39.0–75.0)
RDW: 15.6 % — ABNORMAL HIGH (ref 11.0–14.6)
lymph#: 0.4 10*3/uL — ABNORMAL LOW (ref 0.9–3.3)

## 2012-09-04 LAB — PROTIME-INR: INR: 2.9 (ref 2.00–3.50)

## 2012-09-04 LAB — CEA: CEA: 0.8 ng/mL (ref 0.0–5.0)

## 2012-09-04 NOTE — Progress Notes (Signed)
OFFICE PROGRESS NOTE  Interval history:  [name] returns as scheduled. He completed the final cycle of adjuvant Xeloda on 08/21/2012. He denies nausea/vomiting. No mouth sores. No diarrhea. No hand or foot pain or redness. He continues Coumadin. He denies bleeding.   Objective: Blood pressure 141/76, pulse 74, temperature 97.4 F (36.3 C), temperature source Oral, resp. rate 20, height 6' (1.829 m), weight 356 lb 1.6 oz (161.526 kg).  Oropharynx is without thrush or ulceration. Lungs are clear. Regular cardiac rhythm. Abdomen is soft, obese. No hepatomegaly. Chronic stasis changes at the lower legs bilaterally. Trace edema at the lower legs. Mild dry superficial desquamation at the thumb and index finger bilaterally.  Lab Results: Lab Results  Component Value Date   WBC 3.3* 09/04/2012   HGB 13.0 09/04/2012   HCT 36.8* 09/04/2012   MCV 100.0* 09/04/2012   PLT 96* 09/04/2012    Chemistry:    Chemistry      Component Value Date/Time   NA 142 08/01/2012 1059   NA 137 02/22/2012 1222   K 4.4 08/01/2012 1059   K 4.2 02/22/2012 1222   CL 106 08/01/2012 1059   CL 105 02/22/2012 1222   CO2 23 08/01/2012 1059   CO2 22 02/22/2012 1222   BUN 21.0 08/01/2012 1059   BUN 18 02/22/2012 1222   CREATININE 1.4* 08/01/2012 1059   CREATININE 1.41* 02/22/2012 1222      Component Value Date/Time   CALCIUM 9.0 08/01/2012 1059   CALCIUM 9.2 02/22/2012 1222   ALKPHOS 62 08/01/2012 1059   ALKPHOS 59 02/22/2012 1222   AST 47* 08/01/2012 1059   AST 30 02/22/2012 1222   ALT 53 08/01/2012 1059   ALT 31 02/22/2012 1222   BILITOT 1.24* 08/01/2012 1059   BILITOT 0.7 02/22/2012 1222       Studies/Results: No results found.  Medications: I have reviewed the patient's current medications.  Assessment/Plan:  1. Rectal cancer, stage II (pT3 pN0) status post low anterior resection 12/14/2011. The tumor was noted to be at 10 cm on a rigid proctoscopy at the time of surgery. He completed radiation and  Xeloda from 01/31/2012-03/11/2012. He began a first cycle of adjuvant Xeloda on 04/22/2012. He completed cycle 5 beginning 07/17/2012. He completed cycle 6 beginning 08/08/2012. 2. History of iron deficiency anemia likely secondary to rectal bleeding. Resolved. 3. 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 He is maintained on Coumadin. The PT/INR is in therapeutic range. He will continue Coumadin at the current dose. He will return for a followup PT/INR on 09/12/2012. We are referring him back to the [facility] to resume Coumadin monitoring in approximately 6 weeks. 4. Renal insufficiency. No dose adjustment needed on Xeloda per consultation with the cancer Center pharmacy. 5. Hypertension. 6. 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 Stable.  Disposition-Richard Warner appears stable. He has completed the course of adjuvant therapy. We are obtaining a CEA today. He will return for a followup visit and CEA in 6 months. We are making a referral to Dr. Arlyce Dice for a one-year colonoscopy.  As noted above he will return here on 09/12/2012 for a PT/INR check with subsequent Coumadin monitoring through the [facility].  [name] will contact the office prior to his next visit with any problems.   Plan reviewed with Dr. Truett Perna.  Lonna Cobb ANP/GNP-BC

## 2012-09-04 NOTE — Telephone Encounter (Signed)
Gave pt apt for lab on 2/7, pt will see Coumadin clinic @ [place] on March 2014 then lab and MD on July 2014

## 2012-09-09 ENCOUNTER — Other Ambulatory Visit: Payer: Self-pay | Admitting: Nurse Practitioner

## 2012-09-09 NOTE — Telephone Encounter (Signed)
Message copied by Caleb Popp on Tue Sep 09, 2012  1:00 PM ------      Message from: Thornton Papas B      Created: Thu Sep 04, 2012 10:00 PM       Please call patient, cea is normal

## 2012-09-10 ENCOUNTER — Telehealth: Payer: Self-pay | Admitting: *Deleted

## 2012-09-10 NOTE — Telephone Encounter (Signed)
Message copied by Wandalee Ferdinand on Wed Sep 10, 2012  2:39 PM ------      Message from: Thornton Papas B      Created: Thu Sep 04, 2012 10:00 PM       Please call patient, cea is normal

## 2012-09-10 NOTE — Telephone Encounter (Signed)
Notified CEA normal

## 2012-09-12 ENCOUNTER — Other Ambulatory Visit (HOSPITAL_BASED_OUTPATIENT_CLINIC_OR_DEPARTMENT_OTHER): Payer: Self-pay | Admitting: Lab

## 2012-09-12 ENCOUNTER — Telehealth: Payer: Self-pay | Admitting: *Deleted

## 2012-09-12 DIAGNOSIS — I2699 Other pulmonary embolism without acute cor pulmonale: Secondary | ICD-10-CM

## 2012-09-12 DIAGNOSIS — I82409 Acute embolism and thrombosis of unspecified deep veins of unspecified lower extremity: Secondary | ICD-10-CM

## 2012-09-12 LAB — PROTIME-INR: Protime: 19.2 Seconds — ABNORMAL HIGH (ref 10.6–13.4)

## 2012-09-12 NOTE — Telephone Encounter (Signed)
Pt is taking Coumadin 2.5 mg daily. Instructed pt to increase to 5 mg daily. Will recheck lab on 09/15/12. Pt requests early AM appt.

## 2012-09-15 ENCOUNTER — Other Ambulatory Visit (HOSPITAL_BASED_OUTPATIENT_CLINIC_OR_DEPARTMENT_OTHER): Payer: Self-pay | Admitting: Lab

## 2012-09-15 DIAGNOSIS — I82409 Acute embolism and thrombosis of unspecified deep veins of unspecified lower extremity: Secondary | ICD-10-CM

## 2012-09-15 LAB — PROTIME-INR
INR: 1.9 — ABNORMAL LOW (ref 2.00–3.50)
Protime: 22.8 Seconds — ABNORMAL HIGH (ref 10.6–13.4)

## 2012-09-17 ENCOUNTER — Telehealth: Payer: Self-pay | Admitting: *Deleted

## 2012-09-17 NOTE — Telephone Encounter (Signed)
Message copied by Wandalee Ferdinand on Wed Sep 17, 2012  3:38 PM ------      Message from: Thornton Papas B      Created: Mon Sep 15, 2012 10:13 PM       Please call patient, same coumadin, needs PT checked next 2 weeks via coumadin clinic ------

## 2012-09-17 NOTE — Telephone Encounter (Signed)
Left VM to continue 5 mg daily and will have coumadin clinic call to schedule 2 week check.

## 2012-09-18 ENCOUNTER — Other Ambulatory Visit: Payer: Self-pay | Admitting: *Deleted

## 2012-09-18 DIAGNOSIS — I82409 Acute embolism and thrombosis of unspecified deep veins of unspecified lower extremity: Secondary | ICD-10-CM

## 2012-09-18 DIAGNOSIS — C20 Malignant neoplasm of rectum: Secondary | ICD-10-CM

## 2012-09-19 ENCOUNTER — Other Ambulatory Visit: Payer: Self-pay | Admitting: *Deleted

## 2012-09-19 ENCOUNTER — Telehealth: Payer: Self-pay | Admitting: *Deleted

## 2012-09-19 NOTE — Telephone Encounter (Signed)
error 

## 2012-09-19 NOTE — Progress Notes (Signed)
Per Dr. Truett Perna: Patient will return to [facility]. Will recheck PT here until that appointment in March is completed.

## 2012-09-22 ENCOUNTER — Telehealth: Payer: Self-pay | Admitting: Oncology

## 2012-10-01 ENCOUNTER — Other Ambulatory Visit (HOSPITAL_BASED_OUTPATIENT_CLINIC_OR_DEPARTMENT_OTHER): Payer: Self-pay | Admitting: Lab

## 2012-10-01 DIAGNOSIS — Z5181 Encounter for therapeutic drug level monitoring: Secondary | ICD-10-CM

## 2012-10-01 DIAGNOSIS — I2699 Other pulmonary embolism without acute cor pulmonale: Secondary | ICD-10-CM

## 2012-10-01 DIAGNOSIS — I82409 Acute embolism and thrombosis of unspecified deep veins of unspecified lower extremity: Secondary | ICD-10-CM

## 2012-10-01 DIAGNOSIS — Z7901 Long term (current) use of anticoagulants: Secondary | ICD-10-CM

## 2012-10-01 LAB — PROTIME-INR: INR: 2.3 (ref 2.00–3.50)

## 2012-10-02 ENCOUNTER — Telehealth: Payer: Self-pay | Admitting: *Deleted

## 2012-10-02 NOTE — Telephone Encounter (Signed)
Confirmed with pt that Richard Warner will be managing his coumadin.  Dr. Truett Perna made aware.

## 2012-10-02 NOTE — Telephone Encounter (Signed)
Message copied by Payten Beaumier P on Thu Oct 02, 2012  1:21 PM ------      Message from: Thornton Papas B      Created: Wed Oct 01, 2012  9:03 PM       Who is managing his coumadin? I thought we referred him back to cardiology for coumadin management ------

## 2012-10-09 ENCOUNTER — Telehealth: Payer: Self-pay | Admitting: Oncology

## 2012-10-09 NOTE — Telephone Encounter (Signed)
Talked to pt to r/s appt from 3/21 to April,pt informed me that his next appt is July 2014 emailed MD

## 2012-10-21 ENCOUNTER — Ambulatory Visit (INDEPENDENT_AMBULATORY_CARE_PROVIDER_SITE_OTHER): Payer: Self-pay | Admitting: *Deleted

## 2012-10-21 DIAGNOSIS — C20 Malignant neoplasm of rectum: Secondary | ICD-10-CM

## 2012-10-21 DIAGNOSIS — I2699 Other pulmonary embolism without acute cor pulmonale: Secondary | ICD-10-CM

## 2012-10-24 ENCOUNTER — Ambulatory Visit: Payer: Self-pay | Admitting: Oncology

## 2012-11-06 ENCOUNTER — Ambulatory Visit: Payer: Self-pay | Admitting: Oncology

## 2012-11-18 ENCOUNTER — Ambulatory Visit (INDEPENDENT_AMBULATORY_CARE_PROVIDER_SITE_OTHER): Payer: Self-pay

## 2012-11-18 DIAGNOSIS — C20 Malignant neoplasm of rectum: Secondary | ICD-10-CM

## 2012-11-18 DIAGNOSIS — I2699 Other pulmonary embolism without acute cor pulmonale: Secondary | ICD-10-CM

## 2012-11-18 MED ORDER — WARFARIN SODIUM 5 MG PO TABS: ORAL_TABLET | ORAL | Status: DC

## 2012-12-23 ENCOUNTER — Ambulatory Visit (INDEPENDENT_AMBULATORY_CARE_PROVIDER_SITE_OTHER): Payer: Medicare Other | Admitting: *Deleted

## 2012-12-23 DIAGNOSIS — C20 Malignant neoplasm of rectum: Secondary | ICD-10-CM

## 2012-12-23 DIAGNOSIS — I2699 Other pulmonary embolism without acute cor pulmonale: Secondary | ICD-10-CM

## 2012-12-23 LAB — POCT INR: INR: 3.1

## 2013-01-27 ENCOUNTER — Ambulatory Visit (INDEPENDENT_AMBULATORY_CARE_PROVIDER_SITE_OTHER): Payer: Medicare Other | Admitting: *Deleted

## 2013-01-27 DIAGNOSIS — I2699 Other pulmonary embolism without acute cor pulmonale: Secondary | ICD-10-CM

## 2013-01-27 DIAGNOSIS — C20 Malignant neoplasm of rectum: Secondary | ICD-10-CM

## 2013-02-14 ENCOUNTER — Telehealth: Payer: Self-pay | Admitting: *Deleted

## 2013-02-14 NOTE — Telephone Encounter (Signed)
Lm informing the pt that GBS will be out of the office on 03/05/13. gv appt d/t for 03/16/13 labs @ 11:15am and ov @ 11:45am. Made pt aware that i will mail a letter/cal as well...td

## 2013-03-05 ENCOUNTER — Other Ambulatory Visit: Payer: Self-pay | Admitting: Lab

## 2013-03-05 ENCOUNTER — Ambulatory Visit: Payer: Self-pay | Admitting: Oncology

## 2013-03-10 ENCOUNTER — Ambulatory Visit (INDEPENDENT_AMBULATORY_CARE_PROVIDER_SITE_OTHER): Payer: Medicare Other | Admitting: *Deleted

## 2013-03-10 DIAGNOSIS — C20 Malignant neoplasm of rectum: Secondary | ICD-10-CM

## 2013-03-10 DIAGNOSIS — I2699 Other pulmonary embolism without acute cor pulmonale: Secondary | ICD-10-CM

## 2013-03-16 ENCOUNTER — Telehealth: Payer: Self-pay | Admitting: Oncology

## 2013-03-16 ENCOUNTER — Other Ambulatory Visit (HOSPITAL_BASED_OUTPATIENT_CLINIC_OR_DEPARTMENT_OTHER): Payer: Medicare Other | Admitting: Lab

## 2013-03-16 ENCOUNTER — Ambulatory Visit (HOSPITAL_BASED_OUTPATIENT_CLINIC_OR_DEPARTMENT_OTHER): Payer: Medicare Other | Admitting: Nurse Practitioner

## 2013-03-16 VITALS — BP 147/78 | HR 56 | Temp 97.4°F | Resp 18 | Ht 72.0 in | Wt 362.2 lb

## 2013-03-16 DIAGNOSIS — C20 Malignant neoplasm of rectum: Secondary | ICD-10-CM

## 2013-03-16 DIAGNOSIS — N289 Disorder of kidney and ureter, unspecified: Secondary | ICD-10-CM

## 2013-03-16 DIAGNOSIS — D6959 Other secondary thrombocytopenia: Secondary | ICD-10-CM

## 2013-03-16 DIAGNOSIS — Z7901 Long term (current) use of anticoagulants: Secondary | ICD-10-CM

## 2013-03-16 DIAGNOSIS — I2699 Other pulmonary embolism without acute cor pulmonale: Secondary | ICD-10-CM

## 2013-03-16 LAB — CBC WITH DIFFERENTIAL/PLATELET
Basophils Absolute: 0 10*3/uL (ref 0.0–0.1)
Eosinophils Absolute: 0.1 10*3/uL (ref 0.0–0.5)
HCT: 43.8 % (ref 38.4–49.9)
LYMPH%: 11.6 % — ABNORMAL LOW (ref 14.0–49.0)
MCV: 90.5 fL (ref 79.3–98.0)
MONO#: 0.4 10*3/uL (ref 0.1–0.9)
NEUT#: 3.3 10*3/uL (ref 1.5–6.5)
NEUT%: 76.1 % — ABNORMAL HIGH (ref 39.0–75.0)
Platelets: 123 10*3/uL — ABNORMAL LOW (ref 140–400)
WBC: 4.3 10*3/uL (ref 4.0–10.3)

## 2013-03-16 NOTE — Progress Notes (Signed)
OFFICE PROGRESS NOTE  Interval history:  Richard Warner returns as scheduled. He overall is feeling well. Bowels moving regularly. No pain with bowel movements. No rectal bleeding. He is trying to lose weight by changing his diet and exercising. He has lost 5 pounds.   Objective: Blood pressure 147/78, pulse 56, temperature 97.4 F (36.3 C), temperature source Oral, resp. rate 18, height 6' (1.829 m), weight 362 lb 3.2 oz (164.293 kg).  Oropharynx is without thrush or ulceration. No palpable cervical, supraclavicular, axillary or inguinal lymph nodes. Lungs are clear. Regular cardiac rhythm. Abdomen is soft, obese. No obvious hepatomegaly. Chronic stasis changes at the lower legs bilaterally. Trace edema at the lower legs. Unable to perform complete rectal exam due to body habitus.  Lab Results: Lab Results  Component Value Date   WBC 4.3 03/16/2013   HGB 15.2 03/16/2013   HCT 43.8 03/16/2013   MCV 90.5 03/16/2013   PLT 123* 03/16/2013    Chemistry:    Chemistry      Component Value Date/Time   NA 142 08/01/2012 1059   NA 137 02/22/2012 1222   K 4.4 08/01/2012 1059   K 4.2 02/22/2012 1222   CL 106 08/01/2012 1059   CL 105 02/22/2012 1222   CO2 23 08/01/2012 1059   CO2 22 02/22/2012 1222   BUN 21.0 08/01/2012 1059   BUN 18 02/22/2012 1222   CREATININE 1.4* 08/01/2012 1059   CREATININE 1.41* 02/22/2012 1222      Component Value Date/Time   CALCIUM 9.0 08/01/2012 1059   CALCIUM 9.2 02/22/2012 1222   ALKPHOS 62 08/01/2012 1059   ALKPHOS 59 02/22/2012 1222   AST 47* 08/01/2012 1059   AST 30 02/22/2012 1222   ALT 53 08/01/2012 1059   ALT 31 02/22/2012 1222   BILITOT 1.24* 08/01/2012 1059   BILITOT 0.7 02/22/2012 1222     09/04/2012 CEA 0.8.  Studies/Results: No results found.  Medications: I have reviewed the patient's current medications.  Assessment/Plan:  1. Rectal cancer, stage II (pT3 pN0) status post low anterior resection 12/14/2011. The tumor was noted to be at 10 cm on  a rigid proctoscopy at the time of surgery. He completed radiation and Xeloda from 01/31/2012-03/11/2012. He began a first cycle of adjuvant Xeloda on 04/22/2012. He completed cycle 5 beginning 07/17/2012. He completed cycle 6 beginning 08/08/2012. 2. History of iron deficiency anemia likely secondary to rectal bleeding. Resolved. He will discontinue oral iron. 3. 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 He is maintained on Coumadin.   4. Renal insufficiency. No dose adjustment needed on Xeloda per consultation with the cancer Center pharmacy. 5. Hypertension. 6. 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 He continues to have mild thrombocytopenia.  Disposition-Richard Warner appears stable. He remains in clinical remission from rectal cancer. We will followup on the CEA from today. He will return for a followup visit and CEA in 6 months. He will contact the office in the interim with any problems.  We are referring him to Dr. Arlyce Dice for a one-year colonoscopy.  Plan reviewed with Dr. Truett Perna.  Lonna Cobb ANP/GNP-BC

## 2013-03-16 NOTE — Telephone Encounter (Signed)
gv and printed appt sched and avs for pt.Richard KitchenMarland KitchenMarland KitchenPt sched to see Dr .Arlyce Dice 9.10.14 @ 9:45am

## 2013-03-17 LAB — CEA: CEA: 0.7 ng/mL (ref 0.0–5.0)

## 2013-03-18 ENCOUNTER — Telehealth: Payer: Self-pay | Admitting: *Deleted

## 2013-03-18 NOTE — Telephone Encounter (Signed)
Message copied by Gala Romney on Wed Mar 18, 2013  3:10 PM ------      Message from: [name]      Created: Tue Mar 17, 2013  9:32 PM       Please call patient, cea is normal ------

## 2013-03-18 NOTE — Telephone Encounter (Signed)
Notified pt CEA normal & pt verbalized understanding.

## 2013-04-15 ENCOUNTER — Encounter: Payer: Self-pay | Admitting: Gastroenterology

## 2013-04-15 ENCOUNTER — Ambulatory Visit (INDEPENDENT_AMBULATORY_CARE_PROVIDER_SITE_OTHER): Payer: Medicare Other | Admitting: Gastroenterology

## 2013-04-15 ENCOUNTER — Telehealth: Payer: Self-pay | Admitting: *Deleted

## 2013-04-15 VITALS — BP 142/80 | HR 64 | Ht 69.5 in | Wt 366.0 lb

## 2013-04-15 DIAGNOSIS — C20 Malignant neoplasm of rectum: Secondary | ICD-10-CM

## 2013-04-15 MED ORDER — NA SULFATE-K SULFATE-MG SULF 17.5-3.13-1.6 GM/177ML PO SOLN: 1.0000 | Freq: Once | ORAL | Status: DC

## 2013-04-15 NOTE — Patient Instructions (Addendum)
You have been scheduled for a colonoscopy You will be contaced by our office prior to your procedure for directions on holding your Coumadin/Warfarin.  If you do not hear from our office 1 week prior to your scheduled procedure, please call [phone] to discuss.

## 2013-04-15 NOTE — Progress Notes (Signed)
History of Present Illness:  Pleasant 62 year old white male status post low anterior resection for colorectal cancer in May, 2013 for a rectal cancer, stage II, followed by radiation and chemotherapy (xeloda) here to schedule followup colonoscopy.  He has no GI complaints including change of bowel habits, abdominal pain, melena or hematochezia.    Review of Systems: Pertinent positive and negative review of systems were noted in the above HPI section. All other review of systems were otherwise negative.    Current Medications, Allergies, Past Medical History, Past Surgical History, Family History and Social History were reviewed in [employer] electronic medical record  Vital signs were reviewed in today's medical record. Physical Exam: General: He is an obese male o acute distress Skin: anicteric Head: Normocephalic and atraumatic Eyes:  sclerae anicteric, EOMI Ears: Normal auditory acuity Mouth: No deformity or lesions Lungs: Clear throughout to auscultation Heart: Regular rate and rhythm; no murmurs, rubs or bruits Abdomen: Soft, non tender and non distended. No masses, hepatosplenomegaly or hernias noted. Normal Bowel sounds Rectal:deferred Musculoskeletal: Symmetrical with no gross deformities  Pulses:  Normal pulses noted Extremities: No clubbing.  There are chronic venous stasis changes  Neurological: Alert oriented x 4, grossly nonfocal Psychological:  Alert and cooperative. Normal mood and affect

## 2013-04-15 NOTE — Assessment & Plan Note (Signed)
No evidence for recurrent disease.  Plan followup colonoscopy

## 2013-04-15 NOTE — Telephone Encounter (Signed)
[facility] [address] [place] [address]   04/15/2013    RE: Darin Arndt DOB: [birth year] MRN: [id number]   Dear  Olga Millers MD,    We have scheduled the above patient for an endoscopic procedure. Our records show that he is on anticoagulation therapy.   Please advise as to how long the patient may come off his therapy ofCoumadin prior to the procedure, which is scheduled for 05/15/2013.  Please fax back/ or route the completed form to Merri Ray at [phone].   Sincerely,  Merri Ray

## 2013-04-15 NOTE — Telephone Encounter (Signed)
Weston Brass do you manage this patients coumadin He said he gets it from [facility]

## 2013-04-15 NOTE — Telephone Encounter (Signed)
I have not seen this patient and do not manage his coumadin. Olga Millers

## 2013-04-15 NOTE — Telephone Encounter (Signed)
Pt originally started on Coumadin by Dr. Sherene Sires.  Also seen by Dr. Truett Perna who managed his Coumadin for some time.

## 2013-04-20 ENCOUNTER — Encounter: Payer: Self-pay | Admitting: Gastroenterology

## 2013-04-21 ENCOUNTER — Ambulatory Visit (INDEPENDENT_AMBULATORY_CARE_PROVIDER_SITE_OTHER): Payer: Medicare Other

## 2013-04-21 DIAGNOSIS — I2699 Other pulmonary embolism without acute cor pulmonale: Secondary | ICD-10-CM

## 2013-04-21 DIAGNOSIS — C20 Malignant neoplasm of rectum: Secondary | ICD-10-CM

## 2013-04-21 LAB — POCT INR: INR: 3.6

## 2013-05-06 NOTE — Telephone Encounter (Signed)
Dr Sherene Sires.............      Please advise on patients coumadin

## 2013-05-06 NOTE — Telephone Encounter (Signed)
I haven't seen him in 3 years so would need ov to re-establish if he wants me to approve or follow the coumadin.  He should check with Dr Haywood Lasso office and if they feel he needs it then follow it or refer him to someone who will

## 2013-05-07 ENCOUNTER — Telehealth: Payer: Self-pay | Admitting: *Deleted

## 2013-05-07 ENCOUNTER — Telehealth: Payer: Self-pay | Admitting: Internal Medicine

## 2013-05-07 NOTE — Telephone Encounter (Addendum)
Spoke with Richard Warner @ [facility]in Clinic-- Aware that pt is cleared for Colonoscopy per Dr Sherene Sires.  They will make patient aware of when to stop Coumadin and when to begin back after procedure.  Erica requests this message be sent to her once addressed by MW regarding Lovenox.  Dr Sherene Sires, will patient need to be bridged with Lovenox while off coumadin? Please advise thanks.

## 2013-05-07 NOTE — Telephone Encounter (Addendum)
Spoke with Dr Sherene Sires, Per MW pt is cleared for surgery for Colonoscopy 05/15/13 Per Dr Sherene Sires, Heme/Oncology OR Coumadin Clinic needs give okay to d/c the coumadin--pt needs to be cleared to stop Coumadin and Coumadin Clinic ([facility]) to be made aware of decision so this may be done 5 days prior to procedure.  LMOM for Dr Mariel Sleet Nurse at [facility] with Oncology to return our call ASAP.

## 2013-05-07 NOTE — Telephone Encounter (Signed)
Would need to see him first to rec stopping coumadin/ alternatively since he was referred to [facility] by Heme/onc who was in charge of the coumadin then could clear him and approve it be stopped

## 2013-05-07 NOTE — Telephone Encounter (Signed)
I'm not clear on the indication for continued coumadin since I haven't seen him so I can't comment on the need for bridge

## 2013-05-07 NOTE — Telephone Encounter (Signed)
Richard Warner, Please see message below for Dr Rolin Barry recs on Coumadin. When I spoke with Dr Sherene Sires, he mentioned the fact that he did not see any reason as to why patient needed to stay on Coumadin- Per Dr Sherene Sires, he states he advised pt x 1 yr ago to come off coumadin/no need for further therapy. Pt continued with Coumadin through the Coumadin Clinic. Per Dr Sherene Sires, at this time, he clears patient for Colonoscopy but since he does not see any indication as to why pt should be on continued coumadin, cannot comment on the need for bridging of Lovenox.  Please contact our office if any questions. Thanks.

## 2013-05-07 NOTE — Telephone Encounter (Signed)
Pt aware that Coumadin Clinic will be working with him on D/C Coumadin.  Pt aware to contact Coumadin Clinic if not heard anything by Lunch time tomorrow 10/3

## 2013-05-07 NOTE — Telephone Encounter (Signed)
Colonoscopy on 05/15/13-asking who should be monitoring/dosing his warfarin? Is being followed by [facility] now. He is out of treatment now and not seen again until Feb.

## 2013-05-07 NOTE — Telephone Encounter (Signed)
I spoke with pt. He is going to have colonoscopy done 05/15/13 by Dr. Arlyce Dice. He is still currently on coumadin. He is needing surgical clearance from MW. Last OV 10/2011 and no pending appt. Please advise MW thanks

## 2013-05-08 NOTE — Telephone Encounter (Signed)
Patient to hold coumadin starting tomorrow per [facility]  Patient states he soke with Amy and she stated that He may be able to stop coumadin all together. Patient has an appointment coming up to get all that straight

## 2013-05-08 NOTE — Telephone Encounter (Signed)
Contacted patient still have no word on coumadin/ he needs an appointment with Dr Sherene Sires  Patient has contacted the [facility] They are getting in touch with him today. Explained to patient if we did not hear back toay his procedure would be cancelled

## 2013-05-15 ENCOUNTER — Encounter (HOSPITAL_COMMUNITY)
Admission: RE | Disposition: A | Discharge status: Home / Self Care | Payer: Self-pay | Source: Ambulatory Visit | Attending: Gastroenterology

## 2013-05-15 ENCOUNTER — Encounter (HOSPITAL_COMMUNITY): Payer: Self-pay

## 2013-05-15 ENCOUNTER — Ambulatory Visit (HOSPITAL_COMMUNITY)
Admission: RE | Admit: 2013-05-15 | Discharge: 2013-05-15 | Disposition: A | Discharge status: Home / Self Care | Payer: Medicare Other | Source: Ambulatory Visit | Attending: Gastroenterology | Admitting: Gastroenterology

## 2013-05-15 DIAGNOSIS — D371 Neoplasm of uncertain behavior of stomach: Secondary | ICD-10-CM | POA: Insufficient documentation

## 2013-05-15 DIAGNOSIS — C20 Malignant neoplasm of rectum: Secondary | ICD-10-CM | POA: Insufficient documentation

## 2013-05-15 DIAGNOSIS — Z85038 Personal history of other malignant neoplasm of large intestine: Secondary | ICD-10-CM | POA: Insufficient documentation

## 2013-05-15 DIAGNOSIS — Z85048 Personal history of other malignant neoplasm of rectum, rectosigmoid junction, and anus: Secondary | ICD-10-CM

## 2013-05-15 DIAGNOSIS — D126 Benign neoplasm of colon, unspecified: Secondary | ICD-10-CM

## 2013-05-15 HISTORY — PX: COLONOSCOPY: SHX5424

## 2013-05-15 SURGERY — COLONOSCOPY
Anesthesia: Moderate Sedation

## 2013-05-15 MED ORDER — FENTANYL CITRATE 0.05 MG/ML IJ SOLN
INTRAMUSCULAR | Status: DC | PRN
  Administered 2013-05-15 (×2): 25 ug via INTRAVENOUS

## 2013-05-15 MED ORDER — MIDAZOLAM HCL 10 MG/2ML IJ SOLN
INTRAMUSCULAR | Status: AC
  Filled 2013-05-15: qty 2

## 2013-05-15 MED ORDER — MIDAZOLAM HCL 5 MG/5ML IJ SOLN
INTRAMUSCULAR | Status: DC | PRN
  Administered 2013-05-15 (×2): 2 mg via INTRAVENOUS
  Administered 2013-05-15: 1 mg via INTRAVENOUS

## 2013-05-15 MED ORDER — FENTANYL CITRATE 0.05 MG/ML IJ SOLN
INTRAMUSCULAR | Status: AC
  Filled 2013-05-15: qty 2

## 2013-05-15 MED ORDER — SODIUM CHLORIDE 0.9 % IV SOLN
INTRAVENOUS | Status: DC
  Administered 2013-05-15: 500 mL via INTRAVENOUS

## 2013-05-15 NOTE — H&P (View-Only) (Signed)
History of Present Illness:  Pleasant 62 year old white male status post low anterior resection for colorectal cancer in May, 2013 for a rectal cancer, stage II, followed by radiation and chemotherapy (xeloda) here to schedule followup colonoscopy.  He has no GI complaints including change of bowel habits, abdominal pain, melena or hematochezia.    Review of Systems: Pertinent positive and negative review of systems were noted in the above HPI section. All other review of systems were otherwise negative.    Current Medications, Allergies, Past Medical History, Past Surgical History, Family History and Social History were reviewed in [employer] electronic medical record  Vital signs were reviewed in today's medical record. Physical Exam: General: He is an obese male o acute distress Skin: anicteric Head: Normocephalic and atraumatic Eyes:  sclerae anicteric, EOMI Ears: Normal auditory acuity Mouth: No deformity or lesions Lungs: Clear throughout to auscultation Heart: Regular rate and rhythm; no murmurs, rubs or bruits Abdomen: Soft, non tender and non distended. No masses, hepatosplenomegaly or hernias noted. Normal Bowel sounds Rectal:deferred Musculoskeletal: Symmetrical with no gross deformities  Pulses:  Normal pulses noted Extremities: No clubbing.  There are chronic venous stasis changes  Neurological: Alert oriented x 4, grossly nonfocal Psychological:  Alert and cooperative. Normal mood and affect

## 2013-05-15 NOTE — Interval H&P Note (Signed)
History and Physical Interval Note:  05/15/2013 10:09 AM  Richard Warner  has presented today for surgery, with the diagnosis of rectal cancer  The various methods of treatment have been discussed with the patient and family. After consideration of risks, benefits and other options for treatment, the patient has consented to  Procedure(s): COLONOSCOPY (N/A) as a surgical intervention .  The patient's history has been reviewed, patient examined, no change in status, stable for surgery.  I have reviewed the patient's chart and labs.  Questions were answered to the patient's satisfaction.     The recent H&P (dated *04/15/13**) was reviewed, the patient was examined and there is no change in the patients condition since that H&P was completed.   [name]  05/15/2013, 10:09 AM   [name]

## 2013-05-15 NOTE — Op Note (Signed)
[facility] [address] [place], [address]   COLONOSCOPY PROCEDURE REPORT  PATIENT: Richard Warner, Richard Warner  MR#: [id number] BIRTHDATE: 1950/11/21 , 61  yrs. old GENDER: Male ENDOSCOPIST: [name], MD REFERRED HQ:[facility] Larena Sox, M.D.  [name], M.D. PROCEDURE DATE:  05/15/2013 PROCEDURE:   Colonoscopy with snare polypectomy ASA CLASS:   Class II INDICATIONS:High risk patient with personal history of colon cancer. 2013 MEDICATIONS: These medications were titrated to patient response per physician's verbal order, Versed 5 mg IV, and Fentanyl 50 mcg IV  DESCRIPTION OF PROCEDURE:   After the risks benefits and alternatives of the procedure were thoroughly explained, informed consent was obtained.  A digital rectal exam revealed no abnormalities of the rectum.   The     endoscope was introduced through the anus and advanced to the cecum, which was identified by both the appendix and ileocecal valve. No adverse events experienced.   The quality of the prep was Suprep good  The instrument was then slowly withdrawn as the colon was fully examined.      COLON FINDINGS: A sessile polyp measuring 2-3 mm in size was found at the cecum.  A polypectomy was performed with a cold snare.  The resection was complete and the polyp tissue was completely retrieved.   Two sessile polyps measuring 2-3 mm in size were found in the transverse colon.  A polypectomy was performed.  The resection was complete and the polyp tissue was completely retrieved.   The colon mucosa was otherwise normal.  Retroflexion was not performed due to a narrow rectal vault. The time to cecum= .  Withdrawal time=13 minutes 0 seconds.  The scope was withdrawn and the procedure completed. COMPLICATIONS: There were no complications.  ENDOSCOPIC IMPRESSION: 1.   Sessile polyp measuring 2-3 mm in size was found at the cecum; polypectomy was performed with a cold snare 2.   Two sessile polyps  measuring 2-3 mm in size were found in the transverse colon; polypectomy was performed 3.   The colon mucosa was otherwise normal  RECOMMENDATIONS: Colonoscopy in 3 years   eSigned:  [name], MD 05/15/2013 10:49 AM   cc:   PATIENT NAME:  [name], [name] MR#: [id number]

## 2013-05-18 ENCOUNTER — Encounter (HOSPITAL_COMMUNITY): Payer: Self-pay | Admitting: Gastroenterology

## 2013-05-19 ENCOUNTER — Encounter: Payer: Self-pay | Admitting: Gastroenterology

## 2013-05-22 ENCOUNTER — Ambulatory Visit (INDEPENDENT_AMBULATORY_CARE_PROVIDER_SITE_OTHER): Payer: Medicare Other | Admitting: *Deleted

## 2013-05-22 ENCOUNTER — Telehealth: Payer: Self-pay | Admitting: Internal Medicine

## 2013-05-22 DIAGNOSIS — I2699 Other pulmonary embolism without acute cor pulmonale: Secondary | ICD-10-CM

## 2013-05-22 DIAGNOSIS — C20 Malignant neoplasm of rectum: Secondary | ICD-10-CM

## 2013-05-22 NOTE — Telephone Encounter (Signed)
Yes needs ov for change in rx

## 2013-05-22 NOTE — Telephone Encounter (Signed)
Called, spoke with Jasmine December in Cards.  Informed her pt will need RX with MW to discuss this, and advised I would call pt to set this up.  She verbalized understanding and voiced no further questions or concerns at this time.  Called, spoke with pt's mother.  Pt is unable to come to the phone.  She will ask him to call us back.

## 2013-05-22 NOTE — Telephone Encounter (Signed)
Called and spoke with coumadin clinic and they are aware of MW recs that the pt should have stopped the coumadin a year ago but the pt continued with the coumadin therapy.  Coumadin clinic stated that MW is the one that started the therapy for the pt and they need for MW to state that the pt can stop the coumadin or stay on the coumadin.  MW do you want to the pt to come in for appt with you?  Please advise. Thanks  No Known Allergies

## 2013-05-25 NOTE — Telephone Encounter (Signed)
I spoke with pt. appt scheduled by pt. Nothing further needed

## 2013-05-26 ENCOUNTER — Ambulatory Visit (INDEPENDENT_AMBULATORY_CARE_PROVIDER_SITE_OTHER): Payer: Medicare Other | Admitting: Internal Medicine

## 2013-05-26 ENCOUNTER — Encounter: Payer: Self-pay | Admitting: Internal Medicine

## 2013-05-26 VITALS — BP 130/80 | HR 60 | Temp 98.1°F | Ht 71.0 in | Wt 373.0 lb

## 2013-05-26 DIAGNOSIS — I2699 Other pulmonary embolism without acute cor pulmonale: Secondary | ICD-10-CM

## 2013-05-26 NOTE — Progress Notes (Signed)
Subjective:     Patient ID: Richard Warner, male   DOB: March 19, 1951   MRN: [id number]   Primary = Dr Langley Gauss in [place]  Brief patient profile:  63 yowm with morbid obesity never smoked no previous resp problems prior to PE 02/16/2010  HPI  February 22, 2010 1st pulmonary office eval cc abrupt onset sob 7/14 > [facility] dx PE and Right  leg DVT discharged 7/16 with f/u at [facility] coumadin and lovenox bridge. breathing better. no pleuritic pain, no hemoptysis. H/o remote right ankle fx.   Mid Jan 2012 weak> sob  then to ER in [place] with bloody stools referred to [facility] with plans for endo March 4th and stopped coumadin Jan 22   09/14/11 Wert/ ov to discuss rx off coumadin, still on  Baby aspirin with no more bleeding but slt worse microcytic anemia on gi f/u 2/6.  Doe x exertion, not adls, no change in leg symptoms with tendency to swell if he's up and go down when he elevates. No cough rec Please see patient coordinator before you leave today  to schedule for Echocardiogram and Venous Dopplers  If you unexplained diffiulty with breathing or pain when you breath or sudden swelling of the either leg that doesn't go down when you elevate go to the ER  For now, continue the baby aspirin daily unless obvious bleeding in which case stop it  Wt loss is the longterm key to your health  W/u for anemia/gi bleed > pos colon ca > referred to [facility]  Rectal cancer, stage II (pT3 pN0) status post low anterior resection 12/14/2011. The tumor was noted to be at 10 cm on a rigid proctoscopy at the time of surgery. He completed radiation and Xeloda from 01/31/2012-03/11/2012. He began a first cycle of adjuvant Xeloda on 04/22/2012. He completed cycle 5 beginning 07/17/2012. He completed cycle 6 beginning 08/08/2012> surveillance q 6 m rec, no more rx     05/26/2013 f/u ov/Wert re: coumadin rx  Chief Complaint  Patient presents with  . Follow-up    Pt here to discuss coumadin dose. No changes in his  breathing since his last visit.   doe x  Exertion but not with slow walk flat grade    No obvious day to day or daytime variabilty or assoc chronic cough or cp or chest tightness, subjective wheeze overt sinus or hb symptoms. No unusual exp hx or h/o childhood pna/ asthma or knowledge of premature birth.  Sleeping ok without nocturnal  or early am exacerbation  of respiratory  c/o's or need for noct saba. Also denies any obvious fluctuation of symptoms with weather or environmental changes or other aggravating or alleviating factors except as outlined above   Current Medications, Allergies, Complete Past Medical History, Past Surgical History, Family History, and Social History were reviewed in [employer] record.  ROS  The following are not active complaints unless bolded sore throat, dysphagia, dental problems, itching, sneezing,  nasal congestion or excess/ purulent secretions, ear ache,   fever, chills, sweats, unintended wt loss, pleuritic or exertional cp, hemoptysis,  orthopnea pnd or leg swelling bilaterally s recent changes, presyncope, palpitations, heartburn, abdominal pain, anorexia, nausea, vomiting, diarrhea  or change in bowel or urinary habits, change in stools or urine, dysuria,hematuria,  rash, arthralgias, visual complaints, headache, numbness weakness or ataxia or problems with walking or coordination,  change in mood/affect or memory.           Past Medical  History:  Morbid Obesity  Hypertension  - Echo 02/17/10 wnl  Hyperlipidemia  PE/ R leg DVT  02/16/10  s/p remote ankle fx     Social History:  Separated and lives with Mother  Moved to [place] from [facility]  Worked for [employer]  Never smoker  No ETOH          Objective:   Physical Exam    wt 346 February 22, 2010 vs 369 2/8/132 > 10/24/2011  362  > 05/26/2013  373     10/24/11 362 lb (164.202 kg)  10/08/11 360 lb (163.295 kg)  10/08/11 360 lb (163.295 kg)    amb obese wm nad   HEENT: nl dentition, turbinates, and orophanx. Nl external ear canals without cough reflex  NECK : without JVD/Nodes/TM/ nl carotid upstrokes bilaterally  LUNGS: no acc muscle use, clear to A and P bilaterally without cough on insp or exp maneuvers  CV: RRR no s3 or murmur or increase in P2, no edema  ABD: soft and nontender with nl excursion in the supine position. No bruits or organomegaly, bowel sounds nl  MS: warm without deformities, calf tenderness, cyanosis or clubbing  SKIN: chronic venous stasis changes with hyperpigmentation both legs, trace edema bilaterally     Assessment:

## 2013-05-26 NOTE — Assessment & Plan Note (Signed)
Calorie balance issues reviewed  - See instructions for specific recommendations which were reviewed directly with the patient who was given a copy with highlighter outlining the key components.  

## 2013-05-26 NOTE — Patient Instructions (Signed)
Weight control is simply a matter of calorie balance which needs to be tilted in your favor by eating less and exercising more.  To get the most out of exercise, you need to be continuously aware that you are short of breath, but never out of breath, for 30 minutes daily. As you improve, it will actually be easier for you to do the same amount of exercise  in  30 minutes so always push to the level where you are short of breath.  If this does not result in gradual weight reduction then I strongly recommend you see a nutritionist (Dr Tyson Dense should referral since he is doing your thyroid treatment) with a food diary x 2 weeks so that we can work out a negative calorie balance which is universally effective in steady weight loss programs.  Think of your calorie balance like you do your bank account where in this case you want the balance to go down so you must take in less calories than you burn up.  It's just that simple:  Hard to do, but easy to understand.  Good luck!   Ok to continue coumadin for now  Please schedule a follow up visit in 3 months but call sooner if needed

## 2013-05-26 NOTE — Assessment & Plan Note (Signed)
PE/ R leg DVT 02/2010  s/p remote ankle fx  > repeat ven doppler 10/11/11 = negative - CT Chest 02/16/10  - Echo 02/17/10 wnl  > repeat  10/05/11 and no RVE or PAH  He clearly has MO complicated by venous insufficiency and actually gaining further wt so very high risk recurrent dvt/pe  I had an extended discussion with the patient today lasting 15 to 20 minutes of a 25 minute visit on the following issues: Discussed in detail all the  indications, usual  risks and alternatives  relative to the benefits with patient who agrees to proceed with continued coumadin, work on wt loss

## 2013-06-08 ENCOUNTER — Other Ambulatory Visit: Payer: Self-pay | Admitting: *Deleted

## 2013-06-08 MED ORDER — WARFARIN SODIUM 5 MG PO TABS: ORAL_TABLET | ORAL | Status: DC

## 2013-06-09 ENCOUNTER — Other Ambulatory Visit: Payer: Self-pay | Admitting: *Deleted

## 2013-06-09 MED ORDER — WARFARIN SODIUM 5 MG PO TABS: ORAL_TABLET | ORAL | Status: DC

## 2013-06-19 ENCOUNTER — Ambulatory Visit (INDEPENDENT_AMBULATORY_CARE_PROVIDER_SITE_OTHER): Payer: Medicare Other | Admitting: *Deleted

## 2013-06-19 DIAGNOSIS — I2699 Other pulmonary embolism without acute cor pulmonale: Secondary | ICD-10-CM

## 2013-06-19 DIAGNOSIS — C20 Malignant neoplasm of rectum: Secondary | ICD-10-CM

## 2013-06-19 LAB — POCT INR: INR: 3

## 2013-07-21 ENCOUNTER — Ambulatory Visit (INDEPENDENT_AMBULATORY_CARE_PROVIDER_SITE_OTHER): Payer: Medicare Other | Admitting: *Deleted

## 2013-07-21 DIAGNOSIS — I2699 Other pulmonary embolism without acute cor pulmonale: Secondary | ICD-10-CM

## 2013-07-21 DIAGNOSIS — C20 Malignant neoplasm of rectum: Secondary | ICD-10-CM

## 2013-07-21 LAB — POCT INR: INR: 2.3

## 2013-09-01 ENCOUNTER — Ambulatory Visit (INDEPENDENT_AMBULATORY_CARE_PROVIDER_SITE_OTHER): Payer: Medicare Other | Admitting: *Deleted

## 2013-09-01 DIAGNOSIS — C20 Malignant neoplasm of rectum: Secondary | ICD-10-CM

## 2013-09-01 DIAGNOSIS — I2699 Other pulmonary embolism without acute cor pulmonale: Secondary | ICD-10-CM

## 2013-09-01 DIAGNOSIS — Z5181 Encounter for therapeutic drug level monitoring: Secondary | ICD-10-CM

## 2013-09-01 DIAGNOSIS — I82409 Acute embolism and thrombosis of unspecified deep veins of unspecified lower extremity: Secondary | ICD-10-CM

## 2013-09-01 LAB — POCT INR: INR: 3.1

## 2013-09-08 ENCOUNTER — Telehealth: Payer: Self-pay | Admitting: *Deleted

## 2013-09-08 ENCOUNTER — Other Ambulatory Visit (HOSPITAL_BASED_OUTPATIENT_CLINIC_OR_DEPARTMENT_OTHER): Payer: Medicare Other

## 2013-09-08 ENCOUNTER — Encounter (INDEPENDENT_AMBULATORY_CARE_PROVIDER_SITE_OTHER): Payer: Self-pay

## 2013-09-08 ENCOUNTER — Ambulatory Visit (HOSPITAL_BASED_OUTPATIENT_CLINIC_OR_DEPARTMENT_OTHER): Payer: Medicare Other | Admitting: Oncology

## 2013-09-08 VITALS — BP 173/74 | HR 62 | Temp 98.0°F | Resp 18 | Ht 71.0 in | Wt 368.6 lb

## 2013-09-08 DIAGNOSIS — Z86718 Personal history of other venous thrombosis and embolism: Secondary | ICD-10-CM

## 2013-09-08 DIAGNOSIS — Z7901 Long term (current) use of anticoagulants: Secondary | ICD-10-CM

## 2013-09-08 DIAGNOSIS — C20 Malignant neoplasm of rectum: Secondary | ICD-10-CM

## 2013-09-08 DIAGNOSIS — N289 Disorder of kidney and ureter, unspecified: Secondary | ICD-10-CM

## 2013-09-08 DIAGNOSIS — D696 Thrombocytopenia, unspecified: Secondary | ICD-10-CM

## 2013-09-08 LAB — CBC WITH DIFFERENTIAL/PLATELET
BASO%: 0.9 % (ref 0.0–2.0)
Basophils Absolute: 0 10*3/uL (ref 0.0–0.1)
EOS%: 2.5 % (ref 0.0–7.0)
Eosinophils Absolute: 0.1 10*3/uL (ref 0.0–0.5)
HEMATOCRIT: 45.5 % (ref 38.4–49.9)
HGB: 15.7 g/dL (ref 13.0–17.1)
LYMPH%: 18.3 % (ref 14.0–49.0)
MCH: 31 pg (ref 27.2–33.4)
MCHC: 34.5 g/dL (ref 32.0–36.0)
MCV: 89.8 fL (ref 79.3–98.0)
MONO#: 0.5 10*3/uL (ref 0.1–0.9)
MONO%: 11.9 % (ref 0.0–14.0)
NEUT#: 2.8 10*3/uL (ref 1.5–6.5)
NEUT%: 66.4 % (ref 39.0–75.0)
PLATELETS: 124 10*3/uL — AB (ref 140–400)
RBC: 5.06 10*6/uL (ref 4.20–5.82)
RDW: 12.7 % (ref 11.0–14.6)
WBC: 4.3 10*3/uL (ref 4.0–10.3)
lymph#: 0.8 10*3/uL — ABNORMAL LOW (ref 0.9–3.3)

## 2013-09-08 NOTE — Telephone Encounter (Signed)
appts made and printed...td 

## 2013-09-08 NOTE — Progress Notes (Signed)
   [place]    OFFICE PROGRESS NOTE   INTERVAL HISTORY:   Mr. Brodrick returns for scheduled followup of colon cancer. He feels well. No difficulty with bowel function. He reports an intentional weight loss with changing his diet. He continues Coumadin anticoagulation, managed at the [place] [facility].  He underwent a surveillance colonoscopy on 05/15/2013. A sessile polyp was found at the cecum and removed. 2 sessile polyps were removed from the transverse colon. The pathology revealed tubular adenomas.  Objective:  Vital signs in last 24 hours:  Blood pressure 173/74, pulse 62, temperature 98 F (36.7 C), temperature source Oral, resp. rate 18, height 5\' 11"  (1.803 m), weight 368 lb 9.6 oz (167.196 kg).    HEENT: Neck without mass Lymphatics: No cervical, supraclavicular, axillary, or inguinal nodes Resp: Lungs clear bilaterally Cardio: Regular rate and rhythm GI: No hepatosplenomegaly, no mass, nontender Vascular: Trace edema with chronic stasis change at the low leg bilaterally    Lab Results:  Lab Results  Component Value Date   WBC 4.3 09/08/2013   HGB 15.7 09/08/2013   HCT 45.5 09/08/2013   MCV 89.8 09/08/2013   PLT 124* 09/08/2013   NEUTROABS 2.8 09/08/2013   CEA pending   Medications: I have reviewed the patient's current medications.  Assessment/Plan: 1. Rectal cancer, stage II (pT3 pN0) status post low anterior resection 12/14/2011. The tumor was noted to be at 10 cm on a rigid proctoscopy at the time of surgery. He completed radiation and Xeloda from 01/31/2012-03/11/2012. He began a first cycle of adjuvant Xeloda on 04/22/2012. He completed cycle 5 beginning 07/17/2012. He completed cycle 6 beginning 08/08/2012.  Surveillance colonoscopy 05/15/2013, status post removal of tubular adenomas 2. History of iron deficiency anemia likely secondary to rectal bleeding.  3. 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 He is maintained on Coumadin.   4. Renal insufficiency.  5. Hypertension. 6. Chronic mild thrombocytopenia   Disposition:  Richard Warner remains in clinical remission from colon cancer. He will return for an office visit and CEA in 6 months. He will seek medical attention for bleeding.   Betsy Coder, MD  09/08/2013  4:21 PM

## 2013-09-09 ENCOUNTER — Telehealth: Payer: Self-pay | Admitting: *Deleted

## 2013-09-09 LAB — CEA: CEA: 0.9 ng/mL (ref 0.0–5.0)

## 2013-09-09 NOTE — Telephone Encounter (Signed)
Message copied by Tania Ade on Wed Sep 09, 2013  3:52 PM ------      Message from: Betsy Coder B      Created: Wed Sep 09, 2013  3:34 PM       Please call patient, cea is normal ------

## 2013-09-09 NOTE — Telephone Encounter (Signed)
Notified of normal CEA. 

## 2013-10-05 ENCOUNTER — Encounter: Payer: Self-pay | Admitting: Internal Medicine

## 2013-10-05 ENCOUNTER — Ambulatory Visit (INDEPENDENT_AMBULATORY_CARE_PROVIDER_SITE_OTHER): Payer: Medicare Other | Admitting: Internal Medicine

## 2013-10-05 VITALS — BP 138/80 | HR 60 | Temp 98.0°F | Ht 72.0 in | Wt 372.8 lb

## 2013-10-05 DIAGNOSIS — I2699 Other pulmonary embolism without acute cor pulmonale: Secondary | ICD-10-CM

## 2013-10-05 NOTE — Patient Instructions (Signed)
Please schedule a follow up visit in 6 months but call sooner if needed  

## 2013-10-05 NOTE — Progress Notes (Signed)
Subjective:     Patient ID: Richard Warner, male   DOB: 1950/11/22   MRN: [id number]   Primary = Dr Wendall Papa in [place]  Brief patient profile:  33 yowm with morbid obesity never smoked no previous resp problems prior to PE 02/16/2010  HPI  February 22, 2010 1st pulmonary office eval cc abrupt onset sob 7/14 > [facility] dx PE and Right  leg DVT discharged 7/16 with f/u at [facility] coumadin and lovenox bridge. breathing better. no pleuritic pain, no hemoptysis. H/o remote right ankle fx.   Mid Jan 2012 weak> sob  then to ER in [place] with bloody stools referred to [facility] with plans for endo March 4th and stopped coumadin Jan 22   09/14/11 Wert/ ov to discuss rx off coumadin, still on  Baby aspirin with no more bleeding but slt worse microcytic anemia on gi f/u 2/6.  Doe x exertion, not adls, no change in leg symptoms with tendency to swell if he's up and go down when he elevates. No cough rec Please see patient coordinator before you leave today  to schedule for Echocardiogram and Venous Dopplers  If you unexplained diffiulty with breathing or pain when you breath or sudden swelling of the either leg that doesn't go down when you elevate go to the ER  For now, continue the baby aspirin daily unless obvious bleeding in which case stop it  Wt loss is the longterm key to your health  W/u for anemia/gi bleed > pos colon ca > referred to [facility]  Rectal cancer, stage II (pT3 pN0) status post low anterior resection 12/14/2011. The tumor was noted to be at 10 cm on a rigid proctoscopy at the time of surgery. He completed radiation and Xeloda from 01/31/2012-03/11/2012. He began a first cycle of adjuvant Xeloda on 04/22/2012. He completed cycle 5 beginning 07/17/2012. He completed cycle 6 beginning 08/08/2012> surveillance q 6 m rec, no more rx     05/26/2013 f/u ov/Wert re: coumadin rx  Chief Complaint  Patient presents with  . Follow-up    Pt here to discuss coumadin dose. No changes in his  breathing since his last visit.   doe x  Exertion but not with slow walk flat grade rec Weight control  Ok to continue coumadin for now   10/05/2013 f/u ov/Wert re: PE f/u  Chief Complaint  Patient presents with  . Follow-up    "Breathing is doing better". SOB only with strenous activity. Denies cough, wheezing, CP. No overall complaints.    Not limited by breathing from desired activities  Ut not able to lose any wt. No calf pain or excess leg swelling but moderate chronic scaling and hyperpigmentation bilaterally      No obvious day to day or daytime variabilty or assoc chronic cough or cp or chest tightness, subjective wheeze overt sinus or hb symptoms. No unusual exp hx or h/o childhood pna/ asthma or knowledge of premature birth.  Sleeping ok without nocturnal  or early am exacerbation  of respiratory  c/o's or need for noct saba. Also denies any obvious fluctuation of symptoms with weather or environmental changes or other aggravating or alleviating factors except as outlined above   Current Medications, Allergies, Complete Past Medical History, Past Surgical History, Family History, and Social History were reviewed in [employer] record.  ROS  The following are not active complaints unless bolded sore throat, dysphagia, dental problems, itching, sneezing,  nasal congestion or excess/ purulent secretions, ear  ache,   fever, chills, sweats, unintended wt loss, pleuritic or exertional cp, hemoptysis,  orthopnea pnd or leg swelling bilaterally  , presyncope, palpitations, heartburn, abdominal pain, anorexia, nausea, vomiting, diarrhea  or change in bowel or urinary habits, change in stools or urine, dysuria,hematuria,  rash, arthralgias, visual complaints, headache, numbness weakness or ataxia or problems with walking or coordination,  change in mood/affect or memory.           Past Medical History:  Morbid Obesity  Hypertension  - Echo 02/17/10 wnl   Hyperlipidemia  PE/ R leg DVT  02/16/10  s/p remote ankle fx     Social History:  Separated and lives with Mother  Moved to [place] from [facility]  Worked for [employer]  Never smoker  No ETOH          Objective:   Physical Exam    wt 346 February 22, 2010 vs 369 2/8/132 > 10/24/2011  362  > 05/26/2013  373 > 10/05/2013  373     10/24/11 362 lb (164.202 kg)  10/08/11 360 lb (163.295 kg)  10/08/11 360 lb (163.295 kg)    amb obese wm nad  HEENT: nl dentition, turbinates, and orophanx. Nl external ear canals without cough reflex  NECK : without JVD/Nodes/TM/ nl carotid upstrokes bilaterally  LUNGS: no acc muscle use, clear to A and P bilaterally without cough on insp or exp maneuvers  CV: RRR no s3 or murmur or increase in P2, no edema  ABD: soft and nontender with nl excursion in the supine position. No bruits or organomegaly, bowel sounds nl  MS: warm without deformities, calf tenderness, cyanosis or clubbing  SKIN: chronic venous stasis changes with hyperpigmentation both legs, trace edema bilaterally     Assessment:

## 2013-10-05 NOTE — Assessment & Plan Note (Signed)
PE/ R leg DVT 02/2010  s/p remote ankle fx  > repeat ven doppler 10/11/11 = negative - CT Chest 02/16/10  - Echo 02/17/10 wnl  > repeat  10/05/11 and no RVE or PAH  Main risk factor is obesity at this point assuming he is in complete remission from colon ca but he has not done nearly enough to address the former to consider taking him off coumadin at this point   Discussed in detail all the  indications, usual  risks and alternatives  relative to the benefits with patient who agrees to proceed with indefinite coumadin

## 2013-10-06 ENCOUNTER — Ambulatory Visit (INDEPENDENT_AMBULATORY_CARE_PROVIDER_SITE_OTHER): Payer: Medicare Other | Admitting: Pharmacist

## 2013-10-06 DIAGNOSIS — I82409 Acute embolism and thrombosis of unspecified deep veins of unspecified lower extremity: Secondary | ICD-10-CM

## 2013-10-06 DIAGNOSIS — C20 Malignant neoplasm of rectum: Secondary | ICD-10-CM

## 2013-10-06 DIAGNOSIS — I2699 Other pulmonary embolism without acute cor pulmonale: Secondary | ICD-10-CM

## 2013-10-06 DIAGNOSIS — Z5181 Encounter for therapeutic drug level monitoring: Secondary | ICD-10-CM

## 2013-10-06 LAB — POCT INR: INR: 3.1

## 2013-10-15 IMAGING — CT CT ABD-PELV W/ CM
2 of 5 series · 13 of 32 positions shown, 18 images · IV contrast (omnipaque)
Comparison: None

CLINICAL DATA: Bloody stool

CT ABDOMEN AND PELVIS WITH CONTRAST
TECHNIQUE: Multidetector CT imaging of the abdomen and pelvis was
performed following the standard protocol during bolus
administration of intravenous contrast.
Contrast: 100mL OMNIPAQUE IOHEXOL 300 MG/ML IV SOLN

[Series 2: routine abdomen · axial · 0.98mm/px · z∈[-458,-93]mm · 5 of 111 slices shown, 10 images]
[im 19/111  soft-tissue]
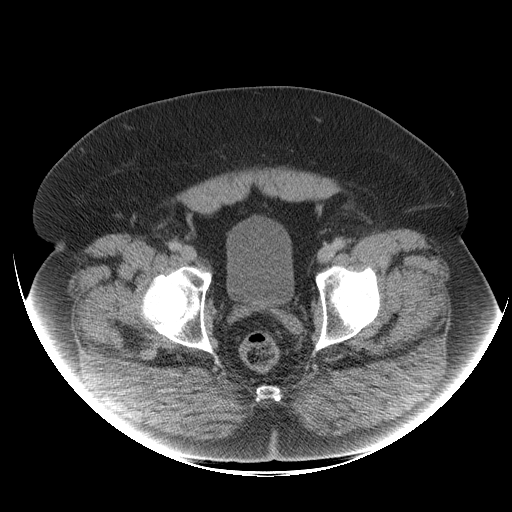
[im 19/111  bone]
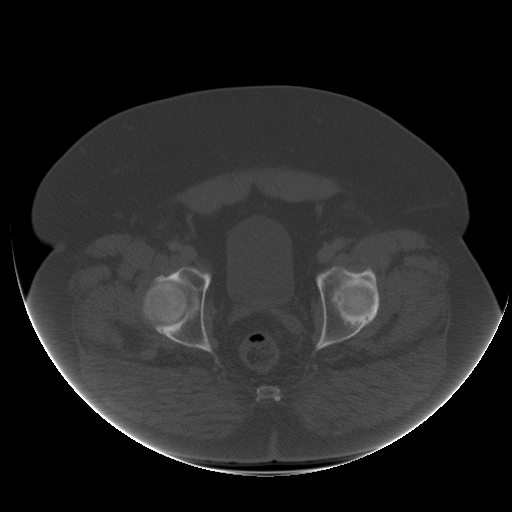
[im 37/111  soft-tissue]
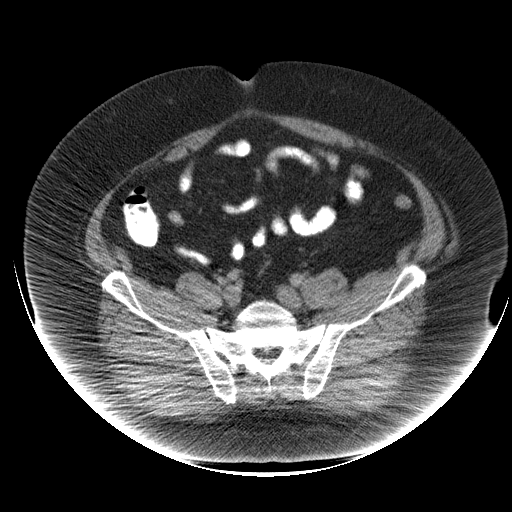
[im 37/111  lung]
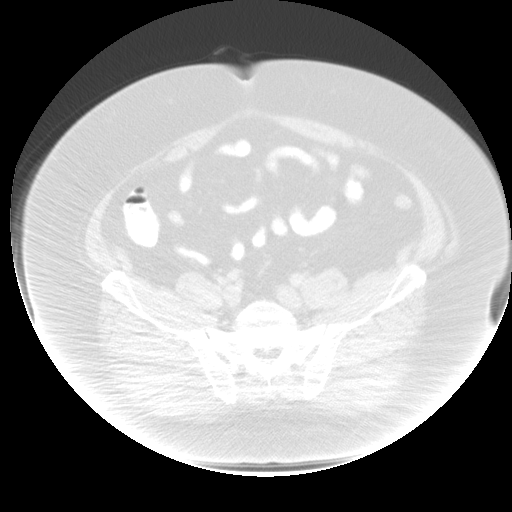
[im 56/111  soft-tissue]
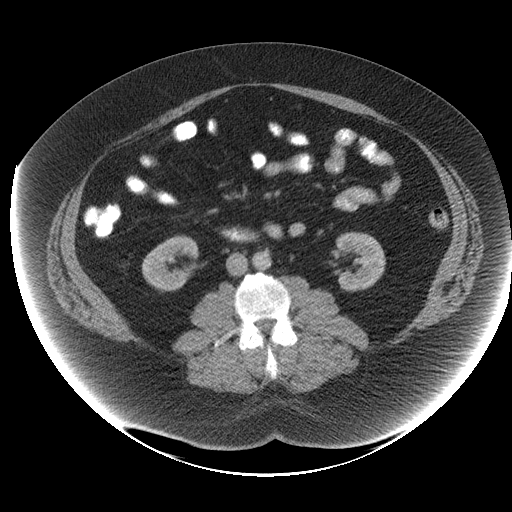
[im 56/111  lung]
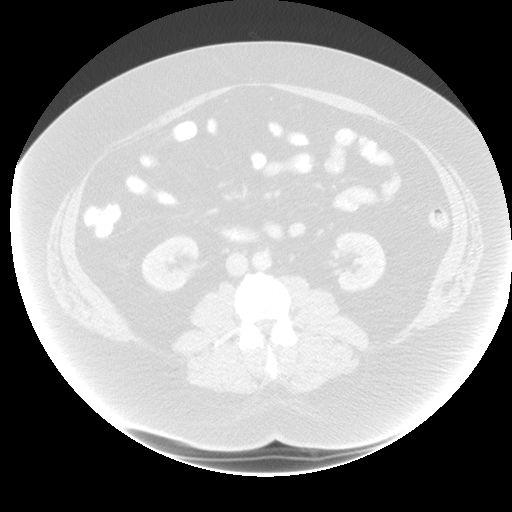
[im 74/111  soft-tissue]
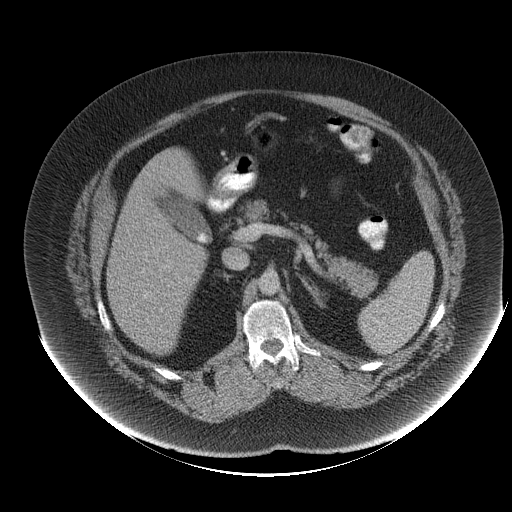
[im 74/111  lung]
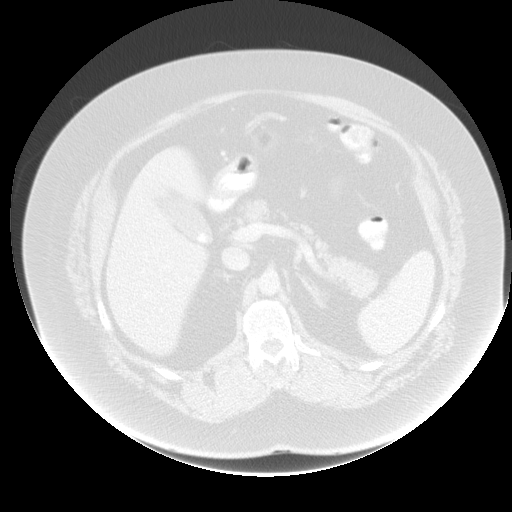
[im 92/111  soft-tissue]
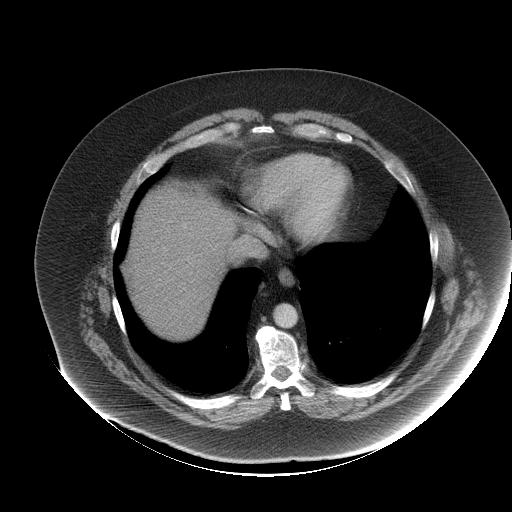
[im 92/111  lung]
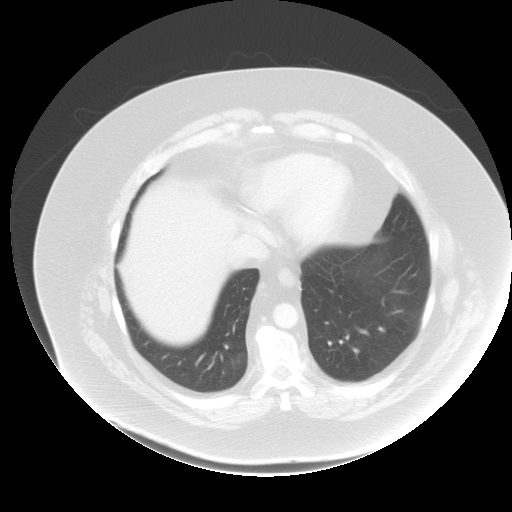

[Series 401: sag · sagittal · 1.10mm/px · 8 of 157 slices shown]
[im 16/157  soft-tissue]
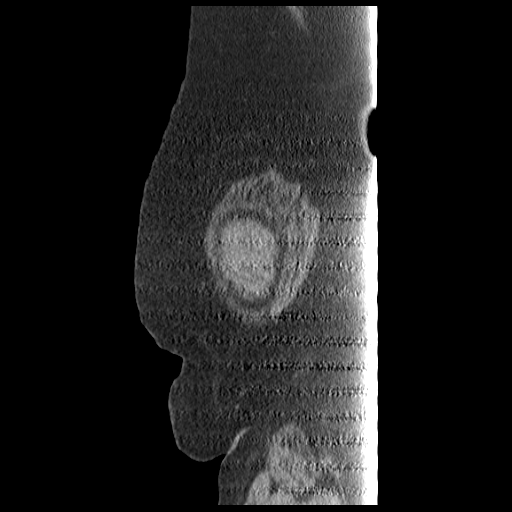
[im 32/157  soft-tissue]
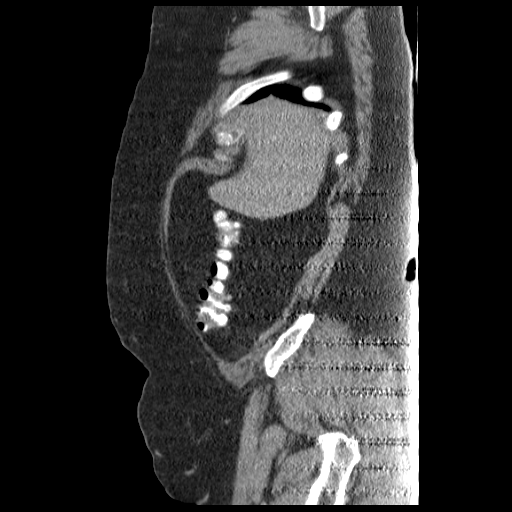
[im 47/157  soft-tissue]
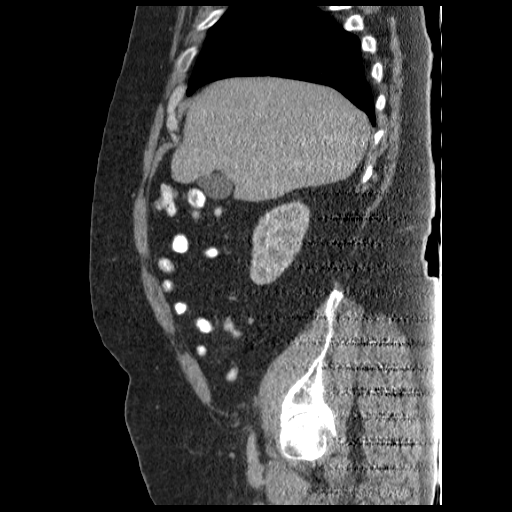
[im 63/157  soft-tissue]
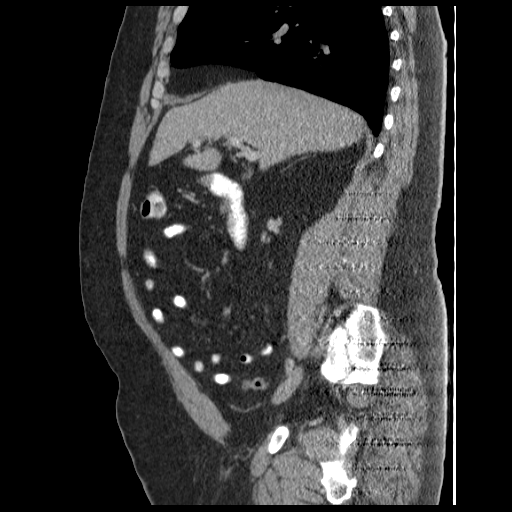
[im 94/157  soft-tissue]
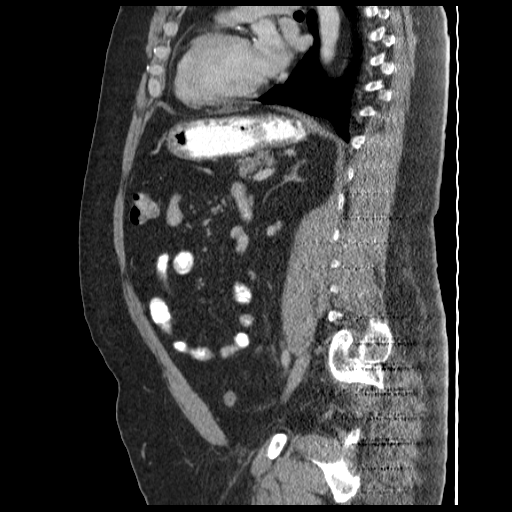
[im 110/157  soft-tissue]
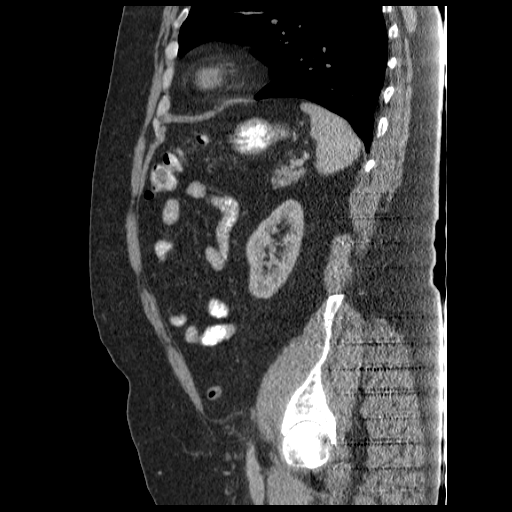
[im 125/157  soft-tissue]
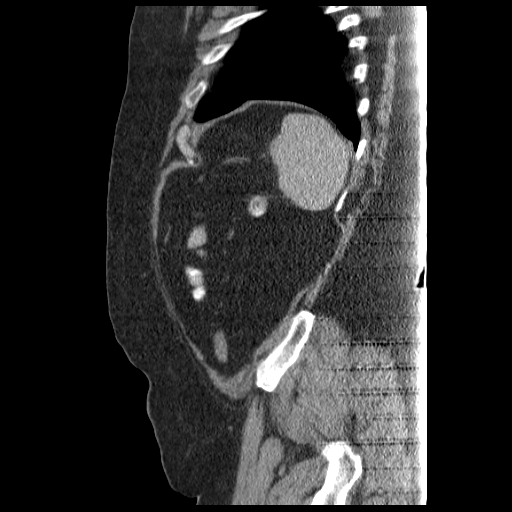
[im 141/157  soft-tissue]
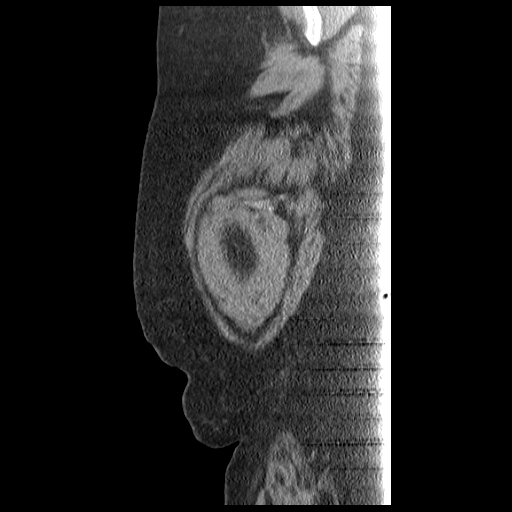

[13 of 32 positions shown; findings below may reference images not displayed]

FINDINGS: No pericardial or pleural effusion identified.

Lung bases appear clear.

Both adrenal glands are normal.

There is a stone identified within the neck of the gallbladder
which measures 9 mm. No gallbladder wall thickening or
pericholecystic fluid.

No focal liver abnormalities.  No biliary dilatation.  The pancreas
appears normal.

Negative appearance of the spleen.

Right kidney is negative.  The left kidney is negative.

No enlarged upper abdominal lymph nodes.

There is no pelvic or inguinal adenopathy noted.

Urinary bladder appears normal.

The stomach is within normal limits.

The small bowel loops are within normal limits.

The appendix is identified and appears normal.

Colon is unremarkable.

Fat containing left inguinal hernia is identified.

No free fluid or abnormal fluid collections within the abdomen or
pelvis.

Review of the visualized osseous structures is significant for mild
spondylosis.
IMPRESSION: 1.  No acute findings identified within the abdomen or pelvis.
2.  Gallstone.

## 2013-11-10 ENCOUNTER — Ambulatory Visit (INDEPENDENT_AMBULATORY_CARE_PROVIDER_SITE_OTHER): Payer: Medicare Other | Admitting: Pharmacist Clinician (PhC)/ Clinical Pharmacy Specialist

## 2013-11-10 DIAGNOSIS — I82409 Acute embolism and thrombosis of unspecified deep veins of unspecified lower extremity: Secondary | ICD-10-CM

## 2013-11-10 DIAGNOSIS — Z5181 Encounter for therapeutic drug level monitoring: Secondary | ICD-10-CM

## 2013-11-10 DIAGNOSIS — C20 Malignant neoplasm of rectum: Secondary | ICD-10-CM

## 2013-11-10 DIAGNOSIS — I2699 Other pulmonary embolism without acute cor pulmonale: Secondary | ICD-10-CM

## 2013-11-10 LAB — POCT INR: INR: 2.8

## 2013-12-22 ENCOUNTER — Ambulatory Visit (INDEPENDENT_AMBULATORY_CARE_PROVIDER_SITE_OTHER): Payer: Medicare Other

## 2013-12-22 DIAGNOSIS — I2699 Other pulmonary embolism without acute cor pulmonale: Secondary | ICD-10-CM

## 2013-12-22 DIAGNOSIS — I82409 Acute embolism and thrombosis of unspecified deep veins of unspecified lower extremity: Secondary | ICD-10-CM

## 2013-12-22 DIAGNOSIS — Z5181 Encounter for therapeutic drug level monitoring: Secondary | ICD-10-CM

## 2013-12-22 DIAGNOSIS — C20 Malignant neoplasm of rectum: Secondary | ICD-10-CM

## 2013-12-22 LAB — POCT INR: INR: 3.3

## 2014-01-19 ENCOUNTER — Ambulatory Visit (INDEPENDENT_AMBULATORY_CARE_PROVIDER_SITE_OTHER): Payer: Medicare Other | Admitting: Pharmacist Clinician (PhC)/ Clinical Pharmacy Specialist

## 2014-01-19 DIAGNOSIS — I82409 Acute embolism and thrombosis of unspecified deep veins of unspecified lower extremity: Secondary | ICD-10-CM

## 2014-01-19 DIAGNOSIS — I2699 Other pulmonary embolism without acute cor pulmonale: Secondary | ICD-10-CM

## 2014-01-19 DIAGNOSIS — C20 Malignant neoplasm of rectum: Secondary | ICD-10-CM

## 2014-01-19 DIAGNOSIS — Z5181 Encounter for therapeutic drug level monitoring: Secondary | ICD-10-CM

## 2014-01-19 LAB — POCT INR: INR: 2.9

## 2014-02-16 ENCOUNTER — Ambulatory Visit (INDEPENDENT_AMBULATORY_CARE_PROVIDER_SITE_OTHER): Payer: Medicare Other | Admitting: *Deleted

## 2014-02-16 DIAGNOSIS — I82409 Acute embolism and thrombosis of unspecified deep veins of unspecified lower extremity: Secondary | ICD-10-CM

## 2014-02-16 DIAGNOSIS — Z5181 Encounter for therapeutic drug level monitoring: Secondary | ICD-10-CM

## 2014-02-16 DIAGNOSIS — I2699 Other pulmonary embolism without acute cor pulmonale: Secondary | ICD-10-CM

## 2014-02-16 DIAGNOSIS — C20 Malignant neoplasm of rectum: Secondary | ICD-10-CM

## 2014-02-16 LAB — POCT INR: INR: 3.5

## 2014-03-09 ENCOUNTER — Ambulatory Visit (INDEPENDENT_AMBULATORY_CARE_PROVIDER_SITE_OTHER): Payer: Medicare Other

## 2014-03-09 ENCOUNTER — Other Ambulatory Visit (HOSPITAL_BASED_OUTPATIENT_CLINIC_OR_DEPARTMENT_OTHER): Payer: Medicare Other

## 2014-03-09 ENCOUNTER — Telehealth: Payer: Self-pay | Admitting: Oncology

## 2014-03-09 ENCOUNTER — Ambulatory Visit (HOSPITAL_BASED_OUTPATIENT_CLINIC_OR_DEPARTMENT_OTHER): Payer: Medicare Other | Admitting: Nurse Practitioner

## 2014-03-09 VITALS — BP 135/82 | HR 65 | Temp 98.6°F | Resp 20 | Ht 72.0 in | Wt 367.2 lb

## 2014-03-09 DIAGNOSIS — C20 Malignant neoplasm of rectum: Secondary | ICD-10-CM

## 2014-03-09 DIAGNOSIS — I2699 Other pulmonary embolism without acute cor pulmonale: Secondary | ICD-10-CM

## 2014-03-09 DIAGNOSIS — N289 Disorder of kidney and ureter, unspecified: Secondary | ICD-10-CM

## 2014-03-09 DIAGNOSIS — E039 Hypothyroidism, unspecified: Secondary | ICD-10-CM

## 2014-03-09 DIAGNOSIS — Z5181 Encounter for therapeutic drug level monitoring: Secondary | ICD-10-CM

## 2014-03-09 DIAGNOSIS — R634 Abnormal weight loss: Secondary | ICD-10-CM

## 2014-03-09 DIAGNOSIS — D696 Thrombocytopenia, unspecified: Secondary | ICD-10-CM

## 2014-03-09 DIAGNOSIS — I82409 Acute embolism and thrombosis of unspecified deep veins of unspecified lower extremity: Secondary | ICD-10-CM

## 2014-03-09 DIAGNOSIS — Z862 Personal history of diseases of the blood and blood-forming organs and certain disorders involving the immune mechanism: Secondary | ICD-10-CM

## 2014-03-09 LAB — CEA: CEA: 2.7 ng/mL (ref 0.0–5.0)

## 2014-03-09 LAB — POCT INR: INR: 2.2

## 2014-03-09 NOTE — Progress Notes (Signed)
  [place] OFFICE PROGRESS NOTE   Diagnosis:  Rectal cancer.  INTERVAL HISTORY:   Richard Warner returns as scheduled. He overall feels well. No change in bowel habits. No bloody or black stools. No pain with bowel movements. He denies abdominal pain. No nausea or vomiting. He has lost weight since beginning Synthroid. He continues Coumadin. No bleeding.  Objective:  Vital signs in last 24 hours:  Blood pressure 135/82, pulse 65, temperature 98.6 F (37 C), temperature source Oral, resp. rate 20, height 6' (1.829 m), weight 367 lb 3.2 oz (166.561 kg), SpO2 100.00%.    HEENT: No thrush or ulcerations. Lymphatics: No palpable cervical, supraclavicular, axillary or inguinal lymph nodes. Resp: Lungs clear. Cardio: Regular rate and rhythm. GI: Abdomen soft, obese. No obvious organomegaly. Vascular: Trace edema with chronic stasis changes at the lower legs bilaterally.    Lab Results:  Lab Results  Component Value Date   WBC 4.3 09/08/2013   HGB 15.7 09/08/2013   HCT 45.5 09/08/2013   MCV 89.8 09/08/2013   PLT 124* 09/08/2013   NEUTROABS 2.8 09/08/2013    Imaging:  No results found.  Medications: I have reviewed the patient's current medications.  Assessment/Plan:  1. Rectal cancer, stage II (pT3 pN0) status post low anterior resection 12/14/2011. The tumor was noted to be at 10 cm on a rigid proctoscopy at the time of surgery. He completed radiation and Xeloda from 01/31/2012-03/11/2012. He began a first cycle of adjuvant Xeloda on 04/22/2012. He completed cycle 5 beginning 07/17/2012. He completed cycle 6 beginning 08/08/2012. Surveillance colonoscopy 05/15/2013, status post removal of tubular adenomas. Next colonoscopy 3 years. 2. History of iron deficiency anemia likely secondary to rectal bleeding.  3. 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 He is maintained on Coumadin.  4. Renal insufficiency.  5. Hypertension. 6. Chronic mild  thrombocytopenia 7. Hypothyroid.  Disposition: Richard Warner remains in clinical remission from rectal cancer. We will followup on the CEA from today. He will return for an office visit and CEA in 6 months. He will contact the office in the interim with any problems  Plan reviewed with Dr. Benay Spice.   Ned Card ANP/GNP-BC   03/09/2014  3:33 PM

## 2014-03-09 NOTE — Telephone Encounter (Signed)
gv and printed appt sched and avs for pt for Jan 2016 °

## 2014-03-12 ENCOUNTER — Telehealth: Payer: Self-pay

## 2014-03-12 ENCOUNTER — Other Ambulatory Visit: Payer: Self-pay | Admitting: *Deleted

## 2014-03-12 DIAGNOSIS — C20 Malignant neoplasm of rectum: Secondary | ICD-10-CM

## 2014-03-12 NOTE — Telephone Encounter (Signed)
Called and informed patient of CEA results, and to schedule a follow up for 2 months. Patient verbalized understanding and denies any questions or concerns.

## 2014-03-12 NOTE — Telephone Encounter (Signed)
Message copied by Tyler Aas A on Fri Mar 12, 2014  2:42 PM ------      Message from: Wardell Heath      Created: Fri Mar 12, 2014  2:41 PM                   ----- Message -----         From: Ludwig Lean, RN         Sent: 03/12/2014   2:33 PM           To: Wardell Heath, LPN                        ----- Message -----         From: Ladell Pier, MD         Sent: 03/11/2014   6:56 PM           To: Tania Ade, RN, Ludwig Lean, RN, #            Please call patient, cea is normal, but higher in normal range, repeat in 2 months ------

## 2014-03-13 ENCOUNTER — Telehealth: Payer: Self-pay | Admitting: Oncology

## 2014-03-13 NOTE — Telephone Encounter (Signed)
labs added per 08/07 POF....Marland KitchenMarland KitchenKJ

## 2014-04-05 ENCOUNTER — Encounter: Payer: Self-pay | Admitting: Internal Medicine

## 2014-04-05 ENCOUNTER — Ambulatory Visit (INDEPENDENT_AMBULATORY_CARE_PROVIDER_SITE_OTHER): Payer: Medicare Other | Admitting: Internal Medicine

## 2014-04-05 VITALS — BP 124/70 | HR 61 | Temp 98.0°F | Ht 71.0 in | Wt 370.0 lb

## 2014-04-05 DIAGNOSIS — I2699 Other pulmonary embolism without acute cor pulmonale: Secondary | ICD-10-CM

## 2014-04-05 NOTE — Patient Instructions (Addendum)
Weight control is simply a matter of calorie balance which needs to be tilted in your favor by eating less and exercising more.  To get the most out of exercise, you need to be continuously aware that you are short of breath, but never out of breath, for 30 minutes daily. As you improve, it will actually be easier for you to do the same amount of exercise  in  30 minutes so always push to the level where you are short of breath.  If this does not result in gradual weight reduction then I strongly recommend you see a nutritionist with a food diary x 2 weeks so that we can work out a negative calorie balance which is universally effective in steady weight loss programs.  Think of your calorie balance like you do your bank account where in this case you want the balance to go down so you must take in less calories than you burn up.  It's just that simple:  Hard to do, but easy to understand.  Good luck!   Pulmonary follow up can be as needed.

## 2014-04-05 NOTE — Progress Notes (Signed)
Subjective:     Patient ID: Richard Warner, male   DOB: [birth year]   MRN: [id number]   Primary =  Dr Rachell Cipro   Brief patient profile:  66 yowm with morbid obesity never smoked no previous resp problems prior to PE 02/16/2010  HPI  February 22, 2010 1st pulmonary office eval cc abrupt onset sob 7/14 > [facility] dx PE and Right  leg DVT discharged 7/16 with f/u at [facility] coumadin and lovenox bridge. breathing better. no pleuritic pain, no hemoptysis. H/o remote right ankle fx.   Mid Jan 2012 weak> sob  then to ER in [place] with bloody stools referred to [facility] with plans for endo March 4th and stopped coumadin Jan 22   09/14/11 Lyndell Allaire/ ov to discuss rx off coumadin, still on  Baby aspirin with no more bleeding but slt worse microcytic anemia on gi f/u 2/6.  Doe x exertion, not adls, no change in leg symptoms with tendency to swell if he's up and go down when he elevates. No cough rec Please see patient coordinator before you leave today  to schedule for Echocardiogram and Venous Dopplers  If you unexplained diffiulty with breathing or pain when you breath or sudden swelling of the either leg that doesn't go down when you elevate go to the ER  For now, continue the baby aspirin daily unless obvious bleeding in which case stop it  Wt loss is the longterm key to your health  W/u for anemia/gi bleed > pos colon ca > referred to [facility]  Rectal cancer, stage II (pT3 pN0) status post low anterior resection 12/14/2011. The tumor was noted to be at 10 cm on a rigid proctoscopy at the time of surgery. He completed radiation and Xeloda from 01/31/2012-03/11/2012. He began a first cycle of adjuvant Xeloda on 04/22/2012. He completed cycle 5 beginning 07/17/2012. He completed cycle 6 beginning 08/08/2012> surveillance q 6 m rec, no more rx     05/26/2013 f/u ov/Javin Nong re: coumadin rx  Chief Complaint  Patient presents with  . Follow-up    Pt here to discuss coumadin dose. No changes in his breathing  since his last visit.   doe x  Exertion but not with slow walk flat grade rec Weight control  Ok to continue coumadin for now   10/05/2013 f/u ov/Darcy Barbara re: PE f/u  Chief Complaint  Patient presents with  . Follow-up    "Breathing is doing better". SOB only with strenous activity. Denies cough, wheezing, CP. No overall complaints.   Not limited by breathing from desired activities but not able to lose any wt. No calf pain or excess leg swelling but moderate chronic scaling and hyperpigmentation bilaterally  rec No change rx    04/05/2014 f/u ov/Makensie Mulhall re: obesity complicated by PE Chief Complaint  Patient presents with  . Follow-up    Pt states breathing is doing well and denies any co's today.   deniesexcess sleepiness  Trying to walk more s sob up and down steps x landing but still unable to lose wt  No obvious day to day or daytime variabilty activity tolerance assoc chronic cough or cp or chest tightness, subjective wheeze overt sinus or hb symptoms. No unusual exp hx or h/o childhood pna/ asthma or knowledge of premature birth.  Sleeping ok without nocturnal  or early am exacerbation  of respiratory  c/o's or need for noct saba. Also denies any obvious fluctuation of symptoms with weather or environmental changes or other aggravating or alleviating  factors except as outlined above   Current Medications, Allergies, Complete Past Medical History, Past Surgical History, Family History, and Social History were reviewed in [employer] record.  ROS  The following are not active complaints unless bolded sore throat, dysphagia, dental problems, itching, sneezing,  nasal congestion or excess/ purulent secretions, ear ache,   fever, chills, sweats, unintended wt loss, pleuritic or exertional cp, hemoptysis,  orthopnea pnd or leg swelling bilaterally  , presyncope, palpitations, heartburn, abdominal pain, anorexia, nausea, vomiting, diarrhea  or change in bowel or urinary  habits, change in stools or urine, dysuria,hematuria,  rash, arthralgias, visual complaints, headache, numbness weakness or ataxia or problems with walking or coordination,  change in mood/affect or memory.           Past Medical History:  Morbid Obesity  Hypertension  - Echo 02/17/10 wnl  Hyperlipidemia  PE/ R leg DVT  02/16/10  s/p remote ankle fx     Social History:  Separated and lives with Mother  Moved to [place] from [facility]  Worked for [employer]  Never smoker  No ETOH          Objective:   Physical Exam    wt 346 February 22, 2010 vs 369 2/8/132 > 10/24/2011  362  > 05/26/2013  373 > 10/05/2013  373 > 04/05/2014  370     10/24/11 362 lb (164.202 kg)  10/08/11 360 lb (163.295 kg)  10/08/11 360 lb (163.295 kg)    amb obese wm nad  HEENT: nl dentition, turbinates, and orophanx. Nl external ear canals without cough reflex  NECK : without JVD/Nodes/TM/ nl carotid upstrokes bilaterally  LUNGS: no acc muscle use, clear to A and P bilaterally without cough on insp or exp maneuvers  CV: RRR no s3 or murmur or increase in P2, no edema  ABD: soft and nontender with nl excursion in the supine position. No bruits or organomegaly, bowel sounds nl  MS: warm without deformities, calf tenderness, cyanosis or clubbing  SKIN: chronic venous stasis changes with hyperpigmentation both legs, trace edema bilaterally          Assessment:

## 2014-04-06 ENCOUNTER — Ambulatory Visit (INDEPENDENT_AMBULATORY_CARE_PROVIDER_SITE_OTHER): Payer: Medicare Other | Admitting: *Deleted

## 2014-04-06 DIAGNOSIS — I82409 Acute embolism and thrombosis of unspecified deep veins of unspecified lower extremity: Secondary | ICD-10-CM

## 2014-04-06 DIAGNOSIS — I2699 Other pulmonary embolism without acute cor pulmonale: Secondary | ICD-10-CM

## 2014-04-06 DIAGNOSIS — Z5181 Encounter for therapeutic drug level monitoring: Secondary | ICD-10-CM

## 2014-04-06 DIAGNOSIS — C20 Malignant neoplasm of rectum: Secondary | ICD-10-CM

## 2014-04-06 LAB — POCT INR: INR: 2.6

## 2014-04-06 NOTE — Assessment & Plan Note (Signed)
Calorie balance issues reviewed in detail > See instructions for specific recommendations which were reviewed directly with the patient who was given a copy with highlighter outlining the key components.

## 2014-04-06 NOTE — Assessment & Plan Note (Signed)
PE/ R leg DVT 02/2010  s/p remote ankle fx  > repeat ven doppler 10/11/11 = negative - CT Chest 02/16/10  - Echo 02/17/10 wnl  > repeat  10/05/11 and no RVE or PAH  Discussed in detail all the  indications, usual  risks and alternatives  relative to the benefits with patient who agrees to proceed with indefinite coumadin due to morbid obesity and clear evidence of venous insuff both legs with high risk of recurrent dz and no reason to stop coumadin in terms of side effects - alternative is xarelto

## 2014-05-04 ENCOUNTER — Ambulatory Visit (INDEPENDENT_AMBULATORY_CARE_PROVIDER_SITE_OTHER): Payer: Medicare Other | Admitting: Pharmacist Clinician (PhC)/ Clinical Pharmacy Specialist

## 2014-05-04 DIAGNOSIS — I2699 Other pulmonary embolism without acute cor pulmonale: Secondary | ICD-10-CM

## 2014-05-04 DIAGNOSIS — Z5181 Encounter for therapeutic drug level monitoring: Secondary | ICD-10-CM

## 2014-05-04 DIAGNOSIS — C20 Malignant neoplasm of rectum: Secondary | ICD-10-CM

## 2014-05-04 DIAGNOSIS — I82409 Acute embolism and thrombosis of unspecified deep veins of unspecified lower extremity: Secondary | ICD-10-CM

## 2014-05-04 LAB — POCT INR: INR: 2.9

## 2014-05-12 ENCOUNTER — Other Ambulatory Visit (HOSPITAL_BASED_OUTPATIENT_CLINIC_OR_DEPARTMENT_OTHER): Payer: Medicare Other

## 2014-05-12 DIAGNOSIS — D696 Thrombocytopenia, unspecified: Secondary | ICD-10-CM

## 2014-05-12 DIAGNOSIS — C20 Malignant neoplasm of rectum: Secondary | ICD-10-CM

## 2014-05-13 LAB — CEA: CEA: 4.7 ng/mL (ref 0.0–5.0)

## 2014-05-19 ENCOUNTER — Telehealth: Payer: Self-pay | Admitting: Oncology

## 2014-05-19 ENCOUNTER — Telehealth: Payer: Self-pay | Admitting: *Deleted

## 2014-05-19 NOTE — Telephone Encounter (Signed)
Notified patient of increase in CEA. Warrants follow up in a month. He understands and agrees to lab and office visit in 1 month. Made him aware that if CEA is higher, he will order scans. POF to scheduler.

## 2014-05-19 NOTE — Telephone Encounter (Signed)
Message copied by Tania Ade on Wed May 19, 2014  1:09 PM ------      Message from: Richard Warner      Created: Fri May 14, 2014  8:05 AM       Please call patient, cea is  Normal but higher in normal range, check in 32month and scheduled office visit few days later.  Will scheduled Ct scans if higher ------

## 2014-05-20 ENCOUNTER — Telehealth: Payer: Self-pay | Admitting: Oncology

## 2014-05-20 NOTE — Telephone Encounter (Signed)
CHANGED TIME FOR 11/17 F/U FROM 830 AM TO 1215 PM DUE TO BS PRESENTING AT [place] ([place]). NEW SCHEDULE MAILED.

## 2014-05-25 ENCOUNTER — Ambulatory Visit
Admission: RE | Admit: 2014-05-25 | Discharge: 2014-05-25 | Disposition: A | Discharge status: Home / Self Care | Payer: Medicare Other | Source: Ambulatory Visit | Attending: Family Medicine | Admitting: Family Medicine

## 2014-05-25 ENCOUNTER — Other Ambulatory Visit: Payer: Self-pay | Admitting: Family Medicine

## 2014-05-25 DIAGNOSIS — R7989 Other specified abnormal findings of blood chemistry: Secondary | ICD-10-CM

## 2014-06-01 ENCOUNTER — Ambulatory Visit (INDEPENDENT_AMBULATORY_CARE_PROVIDER_SITE_OTHER): Payer: Medicare Other

## 2014-06-01 DIAGNOSIS — Z5181 Encounter for therapeutic drug level monitoring: Secondary | ICD-10-CM

## 2014-06-01 DIAGNOSIS — C20 Malignant neoplasm of rectum: Secondary | ICD-10-CM

## 2014-06-01 DIAGNOSIS — I2699 Other pulmonary embolism without acute cor pulmonale: Secondary | ICD-10-CM

## 2014-06-01 DIAGNOSIS — I82409 Acute embolism and thrombosis of unspecified deep veins of unspecified lower extremity: Secondary | ICD-10-CM

## 2014-06-01 LAB — POCT INR: INR: 2.2

## 2014-06-14 ENCOUNTER — Other Ambulatory Visit: Payer: Self-pay | Admitting: Internal Medicine

## 2014-06-17 ENCOUNTER — Other Ambulatory Visit: Payer: Self-pay | Admitting: *Deleted

## 2014-06-17 DIAGNOSIS — C20 Malignant neoplasm of rectum: Secondary | ICD-10-CM

## 2014-06-18 ENCOUNTER — Other Ambulatory Visit (HOSPITAL_BASED_OUTPATIENT_CLINIC_OR_DEPARTMENT_OTHER): Payer: Medicare Other

## 2014-06-18 DIAGNOSIS — C20 Malignant neoplasm of rectum: Secondary | ICD-10-CM

## 2014-06-18 LAB — CEA: CEA: 6 ng/mL — ABNORMAL HIGH (ref 0.0–5.0)

## 2014-06-22 ENCOUNTER — Telehealth: Payer: Self-pay | Admitting: Oncology

## 2014-06-22 ENCOUNTER — Ambulatory Visit (HOSPITAL_BASED_OUTPATIENT_CLINIC_OR_DEPARTMENT_OTHER): Payer: Medicare Other | Admitting: Oncology

## 2014-06-22 VITALS — BP 153/61 | HR 60 | Temp 97.7°F | Resp 25 | Ht 71.0 in | Wt 367.8 lb

## 2014-06-22 DIAGNOSIS — E039 Hypothyroidism, unspecified: Secondary | ICD-10-CM

## 2014-06-22 DIAGNOSIS — I82409 Acute embolism and thrombosis of unspecified deep veins of unspecified lower extremity: Secondary | ICD-10-CM

## 2014-06-22 DIAGNOSIS — N289 Disorder of kidney and ureter, unspecified: Secondary | ICD-10-CM

## 2014-06-22 DIAGNOSIS — C20 Malignant neoplasm of rectum: Secondary | ICD-10-CM

## 2014-06-22 NOTE — Telephone Encounter (Signed)
Pt confirmed labs/ov per 11/17 POF, gave pt AVS..... KJ °

## 2014-06-22 NOTE — Progress Notes (Signed)
  Richard Warner OFFICE PROGRESS NOTE   Diagnosis: colon cancer  INTERVAL HISTORY:   Richard Warner reports feeling well. He is followed by Dr. Ernie Hew for primary care. He reports he is being referred to nephrology for renal insufficiency and proteinuria. He has occasional discomfort at the right mid back. No other complaint. His appetite has diminished since a change in the thyroid hormone dose.  Objective:  Vital signs in last 24 hours:  Blood pressure 153/61, pulse 60, temperature 97.7 F (36.5 C), temperature source Oral, resp. rate 25, height 5\' 11"  (1.803 m), weight 367 lb 12.8 oz (166.833 kg), SpO2 95 %.    HEENT: neck without mass Lymphatics: no cervical, supraclavicular, axillary, or inguinal nodes Resp: lungs clear bilaterally Cardio: regular rate and rhythm GI: no hepatomegaly, nontender, no mass, surgical scars without evidence of recurrent tumor Vascular: chronic stasis change at the lower leg bilaterally Musculoskeletal: Examination of the right mid back is unremarkable      Lab Results:  Lab Results  Component Value Date   WBC 4.3 09/08/2013   HGB 15.7 09/08/2013   HCT 45.5 09/08/2013   MCV 89.8 09/08/2013   PLT 124* 09/08/2013   NEUTROABS 2.8 09/08/2013      Lab Results  Component Value Date   CEA 6.0* 06/18/2014  CEA on 05/12/2014-4.7 CEA on 03/09/2014-2.7  Medications: I have reviewed the patient's current medications.  Assessment/Plan: 1. Rectal cancer, stage II (pT3 pN0) status post low anterior resection 12/14/2011. The tumor was noted to be at 10 cm on a rigid proctoscopy at the time of surgery. He completed radiation and Xeloda from 01/31/2012-03/11/2012. He began a first cycle of adjuvant Xeloda on 04/22/2012. He completed cycle 5 beginning 07/17/2012. He completed cycle 6 beginning 08/08/2012.  Surveillance colonoscopy 05/15/2013, status post removal of tubular adenomas. Next colonoscopy 3 years.  Elevated CEA  06/18/2014 2. History of iron deficiency anemia likely secondary to rectal bleeding.  3. 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 He is maintained on Coumadin.  4. Renal insufficiency.  5. Hypertension. 6. Chronic mild thrombocytopenia 7. Hypothyroid.   Disposition:  Richard Warner appears well. The CEA is mildly elevated. I discussed the differential diagnosis with Richard Warner. He had a colonoscopy approximately 1 year ago. We decided to proceed with a staging PET scan. He will return for an office visit after the PET scan.  Betsy Coder, MD  06/22/2014  12:13 PM

## 2014-06-30 ENCOUNTER — Ambulatory Visit (HOSPITAL_COMMUNITY)
Admission: RE | Admit: 2014-06-30 | Discharge: 2014-06-30 | Disposition: A | Discharge status: Home / Self Care | Payer: Medicare Other | Source: Ambulatory Visit | Attending: Oncology | Admitting: Oncology

## 2014-06-30 DIAGNOSIS — C20 Malignant neoplasm of rectum: Secondary | ICD-10-CM | POA: Insufficient documentation

## 2014-06-30 LAB — GLUCOSE, CAPILLARY: Glucose-Capillary: 128 mg/dL — ABNORMAL HIGH (ref 70–99)

## 2014-06-30 MED ORDER — FLUDEOXYGLUCOSE F - 18 (FDG) INJECTION
15.8500 | Freq: Once | INTRAVENOUS | Status: AC | PRN
  Administered 2014-06-30: 15.85 via INTRAVENOUS

## 2014-07-02 ENCOUNTER — Other Ambulatory Visit: Payer: Medicare Other

## 2014-07-02 ENCOUNTER — Ambulatory Visit (HOSPITAL_BASED_OUTPATIENT_CLINIC_OR_DEPARTMENT_OTHER): Payer: Medicare Other

## 2014-07-02 ENCOUNTER — Ambulatory Visit (HOSPITAL_BASED_OUTPATIENT_CLINIC_OR_DEPARTMENT_OTHER): Payer: Medicare Other | Admitting: Nurse Practitioner

## 2014-07-02 ENCOUNTER — Telehealth: Payer: Self-pay | Admitting: Oncology

## 2014-07-02 VITALS — BP 159/57 | HR 60 | Temp 98.1°F | Resp 20 | Ht 71.0 in | Wt 367.8 lb

## 2014-07-02 DIAGNOSIS — C20 Malignant neoplasm of rectum: Secondary | ICD-10-CM

## 2014-07-02 DIAGNOSIS — D696 Thrombocytopenia, unspecified: Secondary | ICD-10-CM

## 2014-07-02 DIAGNOSIS — N329 Bladder disorder, unspecified: Secondary | ICD-10-CM

## 2014-07-02 LAB — URINALYSIS, MICROSCOPIC - CHCC
BILIRUBIN (URINE): NEGATIVE
Glucose: NEGATIVE mg/dL
Ketones: NEGATIVE mg/dL
Leukocyte Esterase: NEGATIVE
NITRITE: NEGATIVE
Protein: NEGATIVE mg/dL
Specific Gravity, Urine: 1.02 (ref 1.003–1.035)
Urobilinogen, UR: 0.2 mg/dL (ref 0.2–1)
pH: 6 (ref 4.6–8.0)

## 2014-07-02 LAB — CEA: CEA: 6.6 ng/mL — ABNORMAL HIGH (ref 0.0–5.0)

## 2014-07-02 NOTE — Telephone Encounter (Signed)
Dr. Deatra Ina GI office is closed from holiday will call monday to sched

## 2014-07-02 NOTE — Progress Notes (Addendum)
  [place] OFFICE PROGRESS NOTE   Diagnosis:  Rectal cancer  INTERVAL HISTORY:   Richard Warner returns as scheduled. He feels well. He has a good appetite. No change in bowel habits. No hematuria.  Objective:  Vital signs in last 24 hours:  Blood pressure 159/57, pulse 60, temperature 98.1 F (36.7 C), temperature source Oral, resp. rate 20, height 5\' 11"  (1.803 m), weight 367 lb 12.8 oz (166.833 kg), SpO2 96 %.    HEENT: no thrush or ulcers. Lymphatics: no palpable cervical or supraclavicular lymph nodes. Resp: lungs clear bilaterally. Cardio: regular rate and rhythm. GI: abdomen soft and nontender. No hepatomegaly. Vascular: bilateral lower extremity edema with chronic stasis changes at the lower legs.   Lab Results:  Lab Results  Component Value Date   WBC 4.3 09/08/2013   HGB 15.7 09/08/2013   HCT 45.5 09/08/2013   MCV 89.8 09/08/2013   PLT 124* 09/08/2013   NEUTROABS 2.8 09/08/2013    Imaging:  No results found.  Medications: I have reviewed the patient's current medications.  Assessment/Plan: 1. Rectal cancer, stage II (pT3 pN0) status post low anterior resection 12/14/2011. The tumor was noted to be at 10 cm on a rigid proctoscopy at the time of surgery. He completed radiation and Xeloda from 01/31/2012-03/11/2012. He began a first cycle of adjuvant Xeloda on 04/22/2012. He completed cycle 5 beginning 07/17/2012. He completed cycle 6 beginning 08/08/2012.  Surveillance colonoscopy 05/15/2013, status post removal of tubular adenomas. Next colonoscopy 3 years.  Elevated CEA 06/18/2014  PET scan 06/30/2014 with mild focal hypermetabolism at the anorectal junction favored to be physiologic in the absence of associated CT abnormality; irregular bladder wall thickening. 2. History of iron deficiency anemia likely secondary to rectal bleeding.  3. 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 He is maintained on Coumadin.  4. Renal  insufficiency.  5. Hypertension. 6. Chronic mild thrombocytopenia 7. Hypothyroid.   Disposition: Richard Warner appears stable. Dr. Benay Spice reviewed the recent PET scan result with him. He understands there is no obvious etiology for the increase in the CEA. We are referring him to Dr. Deatra Ina to evaluate the area of focal hypermetabolism at the anorectal junction. We are also obtaining a urinalysis due to the irregular bladder wall thickening.  We will followup on the CEA from today and contact him with the result.  His case will be presented by Dr. Benay Spice at the upcoming GI tumor conference.  He is scheduled to return for a followup visit in approximately 2 months. He will contact the office in the interim with any problems.  Patient seen with Dr. Benay Spice.    Ned Card ANP/GNP-BC   07/02/2014  11:14 AM  This was a shared visit with Ned Card.   We will follow-up on the CEA from today. The PET scan reveals no evidence of distant metastatic disease. I will present his case at the GI tumor conference next week. He will be referred to Dr. Deatra Ina for an endoscopic evaluation of the PET scan finding at the anorectal junction. We will make a urology referral for a cystoscopy if the CEA is persistently elevated and the GI evaluation is negative.  Julieanne Manson, M.D.

## 2014-07-05 ENCOUNTER — Telehealth: Payer: Self-pay | Admitting: Oncology

## 2014-07-05 NOTE — Telephone Encounter (Signed)
Pt sched with Amy on 12.2 @ 10am..the patient ok and aware

## 2014-07-07 ENCOUNTER — Ambulatory Visit (INDEPENDENT_AMBULATORY_CARE_PROVIDER_SITE_OTHER): Payer: Medicare Other | Admitting: Physician Assistant

## 2014-07-07 ENCOUNTER — Telehealth: Payer: Self-pay | Admitting: Gastroenterology

## 2014-07-07 ENCOUNTER — Encounter: Payer: Self-pay | Admitting: Physician Assistant

## 2014-07-07 VITALS — BP 142/64 | HR 64 | Ht 71.0 in | Wt 369.0 lb

## 2014-07-07 DIAGNOSIS — R9389 Abnormal findings on diagnostic imaging of other specified body structures: Secondary | ICD-10-CM

## 2014-07-07 DIAGNOSIS — Z85048 Personal history of other malignant neoplasm of rectum, rectosigmoid junction, and anus: Secondary | ICD-10-CM

## 2014-07-07 DIAGNOSIS — R938 Abnormal findings on diagnostic imaging of other specified body structures: Secondary | ICD-10-CM

## 2014-07-07 MED ORDER — MOVIPREP 100 G PO SOLR: 1.0000 | Freq: Once | ORAL | Status: DC

## 2014-07-07 NOTE — Telephone Encounter (Signed)
07/07/2014   RE: Richard Warner DOB: 1951-06-10 MRN: [id number]   Dear Dr Melvyn Novas,    We have scheduled the above patient for a Colonoscopy procedure. Our records show that he is on anticoagulation therapy.   Please advise as to how long the patient may come off his therapy of Coumadin prior to the procedure, which is scheduled for 07/27/14.  Please fax back/ or route the completed form to [facility] at [phone].   Sincerely,    Eryn Marandola A.

## 2014-07-07 NOTE — Patient Instructions (Signed)
You have been scheduled for a colonoscopy. Please follow written instructions given to you at your visit today.  Please pick up your prep kit at the pharmacy within the next 1-3 days. If you use inhalers (even only as needed), please bring them with you on the day of your procedure. Your physician has requested that you go to www.startemmi.com and enter the access code given to you at your visit today. This web site gives a general overview about your procedure. However, you should still follow specific instructions given to you by our office regarding your preparation for the procedure.  You will be contaced by our office prior to your procedure for directions on holding your Coumadin/Warfarin.  If you do not hear from our office 1 week prior to your scheduled procedure, please call [phone] to discuss. 

## 2014-07-07 NOTE — Telephone Encounter (Signed)
Will route to [employer].

## 2014-07-07 NOTE — Progress Notes (Signed)
As the patient had a colonoscopy a year ago and PET scan lights up in the rectosigmoid I think it is adequate to do a sigmoidoscopy only.  In addition, if  anticipating taking biopsies only I think we can continue Coumadin.

## 2014-07-07 NOTE — Progress Notes (Signed)
Patient ID: Richard Warner, male   DOB: 05-13-1951, 63 y.o.   MRN: [id number]   Subjective:    Patient ID: Richard Warner, male    DOB: [birth year], 63 y.o.   MRN: [id number]  HPI Richard Warner is very nice 63 year old white male known to Richard Warner. He is currently being referred by Richard Warner  Because  of abnormality on recent PET scan. Patient has history of a stage II rectal cancer which was diagnosed in 2013. This was a T3 N0 lesion and he underwent a low anterior resection. He then completed a course of radiation and chemotherapy and then a course of adjuvant chemotherapy. He last had colonoscopy in October 2014 was noted to have 3 tubular adenomas all of which were removed area and Colon mucosa otherwise normal with a narrowed rectal vault. Patient had undergone recent PET scan for reevaluation on 06/20/2014 and was noted to have mild focal hypermetabolism at the anorectal junction possibly physiologic was also irregular bladder wall thickening. Patient states that he feels fine. He has not had any change in bowel habits, no melena or hematochezia, no rectal discomfort or pain. Recent CEA has gone from 2.7-6.6. Patient has history of DVT and pulmonary embolus in 2011 and has been on chronic Coumadin, also with history of thyroidism, renal insufficiency, hypertension, and morbid obesity.  Review of Systems Pertinent positive and negative review of systems were noted in the above HPI section.  All other review of systems was otherwise negative.  Outpatient Encounter Prescriptions as of 07/07/2014  Medication Sig  . aspirin EC 81 MG tablet Take 81 mg by mouth daily.  [name] atenolol (TENORMIN) 100 MG tablet Take 100 mg by mouth every evening.   . Cholecalciferol (VITAMIN D3) 5000 UNITS TABS Take 1 capsule by mouth daily.  [name] doxazosin (CARDURA) 2 MG tablet Take 2 mg by mouth at bedtime.  [name] ezetimibe (ZETIA) 10 MG tablet Take 10 mg by mouth daily.  . furosemide (LASIX) 20 MG tablet Take 10 mg by  mouth daily. Takes 10 mg daily  . levothyroxine (SYNTHROID, LEVOTHROID) 75 MCG tablet Take 75 mcg by mouth daily before breakfast.  . simvastatin (ZOCOR) 40 MG tablet Take 40 mg by mouth every evening.  . warfarin (COUMADIN) 5 MG tablet TAKE AS DIRECTED BY ANTICOAGULATION CLINIC  . MOVIPREP 100 G SOLR Take 1 kit (200 g total) by mouth once.  . [DISCONTINUED] Vitamin D, Ergocalciferol, (DRISDOL) 50000 UNITS CAPS capsule Take 50,000 Units by mouth daily.    No Known Allergies Patient Active Problem List   Diagnosis Date Noted  . Encounter for therapeutic drug monitoring 09/01/2013  . Rectal cancer, pT3pN0 (0/12 LN) - mid rectum 12/19/2011  . Clotting disorder   . Benign neoplasm of colon 10/08/2011  . Iron deficiency anemia, unspecified 09/12/2011  . DVT (deep venous thrombosis) 10/09/2010  . PULMONARY EMBOLISM AND INFARCTION 02/22/2010  . HYPOTHYROIDISM 02/21/2010  . DYSLIPIDEMIA 02/21/2010  . Obesity, Class III, BMI 40-49.9 (morbid obesity) 02/21/2010  . HYPERTENSION 02/21/2010  . RENAL DISEASE, CHRONIC, STAGE III 02/21/2010   History   Social History  . Marital Status: Single    Spouse Name: N/A    Number of Children: N/A  . Years of Education: N/A   Occupational History  . MERCHANT     SELF EMPLOYED   Social History Main Topics  . Smoking status: Never Smoker   . Smokeless tobacco: Never Used  . Alcohol Use: No  . Drug Use: No  .  Sexual Activity: Not on file   Other Topics Concern  . Not on file   Social History Narrative    Richard Warner's family history includes Cancer in his paternal grandfather; Diabetes in his father; Heart disease in his father; Hypertension in his paternal grandfather; Lung cancer in his paternal grandfather; Stroke in his maternal grandfather and maternal grandmother.      Objective:    Filed Vitals:   07/07/14 1001  BP: 142/64  Pulse: 64    Physical Exam  very pleasant morbidly obese older white male in no acute distress. Height  5 foot 11 weight 369 BMI 51.4. HEENT; nontraumatic normocephalic EOMI PERRLA sclera anicteric, Supple; no JVD, Cardiovascular; regular rate and rhythm with S1-S2 no murmur rub or gallop, Pulmonary; clear bilaterally though decreased breath sounds, Abdomen; very large, soft nontender bowel sounds are present no palpable mass or hepatosplenomegaly, Rectal; exam not done, Extremities; no clubbing cyanosis he does have chronic stasis changes bilaterally in the lower extremities, Psych; mood and affect appropriate     Assessment & Plan:   #36 63 year old white male with history of stage II rectal adenocarcinoma diagnosed 2013, status post low anterior resection then chemotherapy and radiation. Recent PET scan showing mild focal hypermetabolism the anorectal junction, possibly physiologic was also no red noted irregular bladder wall thickening. Findings on PET scan may be physiologic but will need to rule out recurrence of rectal cancer #2 chronic anticoagulation with Coumadin #3 history of DVT and pulmonary embolus #4 hypertension #5 hypothyroidism #6 chronic kidney disease stage III #7 morbid obesity #8 adenomatous colon polyps last colonoscopy October 2014  Plan; patient will be scheduled for colonoscopy with Richard Warner at the hospital due to BMI greater than 50. Procedure discussed in detail with the patient and he is agreeable to proceed We will obtain consent from his pulmonologist Richard Warner for him to hold Coumadin for 5 days prior to the procedure. Further plans pending results of colonoscopy.   Plan;  Richard Ferguson PA-C 07/07/2014

## 2014-07-07 NOTE — Telephone Encounter (Signed)
Ok to hold coumadin as prophylactic only at this point Send form

## 2014-07-08 ENCOUNTER — Telehealth: Payer: Self-pay

## 2014-07-08 NOTE — Telephone Encounter (Signed)
Called the patient and left a message for him to return the call to me concerning his medication.

## 2014-07-08 NOTE — Telephone Encounter (Signed)
Patient notified. Offered to change his prep. Patient opts to use the Moviprep.

## 2014-07-09 NOTE — Telephone Encounter (Signed)
Patient informed per Dr. Melvyn Novas, ok to hold Coumadin 5 days prior to procedure.

## 2014-07-13 ENCOUNTER — Ambulatory Visit (INDEPENDENT_AMBULATORY_CARE_PROVIDER_SITE_OTHER): Payer: Medicare Other

## 2014-07-13 ENCOUNTER — Encounter (HOSPITAL_COMMUNITY): Payer: Self-pay | Admitting: *Deleted

## 2014-07-13 DIAGNOSIS — C20 Malignant neoplasm of rectum: Secondary | ICD-10-CM

## 2014-07-13 DIAGNOSIS — I82409 Acute embolism and thrombosis of unspecified deep veins of unspecified lower extremity: Secondary | ICD-10-CM

## 2014-07-13 DIAGNOSIS — Z5181 Encounter for therapeutic drug level monitoring: Secondary | ICD-10-CM

## 2014-07-13 DIAGNOSIS — I2699 Other pulmonary embolism without acute cor pulmonale: Secondary | ICD-10-CM

## 2014-07-13 LAB — POCT INR: INR: 2.4

## 2014-07-27 ENCOUNTER — Ambulatory Visit (HOSPITAL_COMMUNITY)
Admission: RE | Admit: 2014-07-27 | Discharge: 2014-07-27 | Disposition: A | Discharge status: Home / Self Care | Payer: Medicare Other | Source: Ambulatory Visit | Attending: Gastroenterology | Admitting: Gastroenterology

## 2014-07-27 ENCOUNTER — Encounter (HOSPITAL_COMMUNITY)
Admission: RE | Disposition: A | Discharge status: Home / Self Care | Payer: Self-pay | Source: Ambulatory Visit | Attending: Gastroenterology

## 2014-07-27 ENCOUNTER — Encounter (HOSPITAL_COMMUNITY): Payer: Self-pay | Admitting: *Deleted

## 2014-07-27 ENCOUNTER — Ambulatory Visit (HOSPITAL_COMMUNITY): Payer: Medicare Other | Admitting: Anesthesiology

## 2014-07-27 DIAGNOSIS — Z86711 Personal history of pulmonary embolism: Secondary | ICD-10-CM | POA: Insufficient documentation

## 2014-07-27 DIAGNOSIS — Z7982 Long term (current) use of aspirin: Secondary | ICD-10-CM | POA: Insufficient documentation

## 2014-07-27 DIAGNOSIS — Z1211 Encounter for screening for malignant neoplasm of colon: Secondary | ICD-10-CM

## 2014-07-27 DIAGNOSIS — I129 Hypertensive chronic kidney disease with stage 1 through stage 4 chronic kidney disease, or unspecified chronic kidney disease: Secondary | ICD-10-CM | POA: Insufficient documentation

## 2014-07-27 DIAGNOSIS — Z7901 Long term (current) use of anticoagulants: Secondary | ICD-10-CM | POA: Diagnosis not present

## 2014-07-27 DIAGNOSIS — N183 Chronic kidney disease, stage 3 (moderate): Secondary | ICD-10-CM | POA: Diagnosis not present

## 2014-07-27 DIAGNOSIS — K648 Other hemorrhoids: Secondary | ICD-10-CM | POA: Insufficient documentation

## 2014-07-27 DIAGNOSIS — Z86718 Personal history of other venous thrombosis and embolism: Secondary | ICD-10-CM | POA: Insufficient documentation

## 2014-07-27 DIAGNOSIS — E039 Hypothyroidism, unspecified: Secondary | ICD-10-CM | POA: Insufficient documentation

## 2014-07-27 DIAGNOSIS — D122 Benign neoplasm of ascending colon: Secondary | ICD-10-CM | POA: Diagnosis not present

## 2014-07-27 DIAGNOSIS — E785 Hyperlipidemia, unspecified: Secondary | ICD-10-CM | POA: Diagnosis not present

## 2014-07-27 DIAGNOSIS — Z6841 Body Mass Index (BMI) 40.0 and over, adult: Secondary | ICD-10-CM | POA: Diagnosis not present

## 2014-07-27 DIAGNOSIS — Z8500 Personal history of malignant neoplasm of unspecified digestive organ: Secondary | ICD-10-CM

## 2014-07-27 HISTORY — PX: COLONOSCOPY: SHX5424

## 2014-07-27 SURGERY — COLONOSCOPY
Anesthesia: Monitor Anesthesia Care

## 2014-07-27 MED ORDER — LACTATED RINGERS IV SOLN: INTRAVENOUS | Status: DC

## 2014-07-27 MED ORDER — PROPOFOL 10 MG/ML IV BOLUS
INTRAVENOUS | Status: AC
  Filled 2014-07-27: qty 20

## 2014-07-27 MED ORDER — PROPOFOL 10 MG/ML IV BOLUS
INTRAVENOUS | Status: DC | PRN
  Administered 2014-07-27 (×2): 40 mg via INTRAVENOUS
  Administered 2014-07-27: 60 mg via INTRAVENOUS

## 2014-07-27 MED ORDER — LACTATED RINGERS IV SOLN
INTRAVENOUS | Status: DC | PRN
  Administered 2014-07-27: 09:00:00 via INTRAVENOUS

## 2014-07-27 NOTE — Anesthesia Postprocedure Evaluation (Signed)
Anesthesia Post Note  Patient: Richard Warner  Procedure(s) Performed: Procedure(s) (LRB): COLONOSCOPY (N/A)  Anesthesia type: MAC  Patient location: PACU  Post pain: Pain level controlled  Post assessment: Post-op Vital signs reviewed  Last Vitals: BP 181/84 mmHg  Pulse 47  Temp(Src) 36.7 C (Oral)  Resp 21  SpO2 97%  Post vital signs: Reviewed  Level of consciousness: awake  Complications: No apparent anesthesia complications

## 2014-07-27 NOTE — Interval H&P Note (Signed)
History and Physical Interval Note:  07/27/2014 9:15 AM  Richard Warner  has presented today for surgery, with the diagnosis of Abnormal Imaging, Hx of rectal cancer  The various methods of treatment have been discussed with the patient and family. After consideration of risks, benefits and other options for treatment, the patient has consented to  Procedure(s): FLEXIBLE SIGMOIDOSCOPY (N/A) as a surgical intervention .  The patient's history has been reviewed, patient examined, no change in status, stable for surgery.  I have reviewed the patient's chart and labs.  Questions were answered to the patient's satisfaction.    The recent H&P (dated **07/07/14*) was reviewed, the patient was examined and there is no change in the patients condition since that H&P was completed.   Richard Warner  07/27/2014, 9:15 AM    Richard Warner

## 2014-07-27 NOTE — Anesthesia Preprocedure Evaluation (Signed)
Anesthesia Evaluation  Patient identified by MRN, date of birth, ID band Patient awake    Reviewed: Allergy & Precautions, H&P , NPO status , Patient's Chart, lab work & pertinent test results  Airway Mallampati: II  TM Distance: >3 FB Neck ROM: Full    Dental no notable dental hx.    Pulmonary shortness of breath, sleep apnea ,  breath sounds clear to auscultation  Pulmonary exam normal       Cardiovascular hypertension, Pt. on home beta blockers and Pt. on medications Rhythm:Regular Rate:Normal  Dr. Acie Fredrickson and Dr. Gustavus Bryant notes reviewed. H/O DVT and PE in 2011   Neuro/Psych negative neurological ROS  negative psych ROS   GI/Hepatic negative GI ROS, Neg liver ROS,   Endo/Other  Hypothyroidism Morbid obesity  Renal/GU Renal InsufficiencyRenal diseaseCr 1.61     Musculoskeletal  (+) Arthritis -,   Abdominal (+) + obese,   Peds  Hematology negative hematology ROS (+) anemia ,   Anesthesia Other Findings   Reproductive/Obstetrics negative OB ROS                             Anesthesia Physical  Anesthesia Plan  ASA: III  Anesthesia Plan: MAC   Post-op Pain Management:    Induction: Intravenous  Airway Management Planned:   Additional Equipment:   Intra-op Plan:   Post-operative Plan:   Informed Consent: I have reviewed the patients History and Physical, chart, labs and discussed the procedure including the risks, benefits and alternatives for the proposed anesthesia with the patient or authorized representative who has indicated his/her understanding and acceptance.   Dental advisory given  Plan Discussed with: CRNA  Anesthesia Plan Comments:         Anesthesia Quick Evaluation

## 2014-07-27 NOTE — Discharge Instructions (Signed)
Resume Coumadin in a.m.

## 2014-07-27 NOTE — Transfer of Care (Signed)
Immediate Anesthesia Transfer of Care Note  Patient: Richard Warner  Procedure(s) Performed: Procedure(s): COLONOSCOPY (N/A)  Patient Location: PACU and Endoscopy Unit  Anesthesia Type:MAC  Level of Consciousness: awake, alert , oriented and patient cooperative  Airway & Oxygen Therapy: Patient Spontanous Breathing and Patient connected to face mask oxygen  Post-op Assessment: Report given to PACU RN, Post -op Vital signs reviewed and stable and Patient moving all extremities  Post vital signs: Reviewed and stable  Complications: No apparent anesthesia complications

## 2014-07-27 NOTE — H&P (View-Only) (Signed)
Patient ID: Richard Warner, male   DOB: 05-13-1951, 63 y.o.   MRN: [id number]   Subjective:    Patient ID: Richard Warner, male    DOB: [birth year], 63 y.o.   MRN: [id number]  HPI Richard Warner is very nice 63 year old white male known to Dr. Deatra Warner. He is currently being referred by Dr. Benay Warner  Because  of abnormality on recent PET scan. Patient has history of a stage II rectal cancer which was diagnosed in 2013. This was a T3 N0 lesion and he underwent a low anterior resection. He then completed a course of radiation and chemotherapy and then a course of adjuvant chemotherapy. He last had colonoscopy in October 2014 was noted to have 3 tubular adenomas all of which were removed area and Colon mucosa otherwise normal with a narrowed rectal vault. Patient had undergone recent PET scan for reevaluation on 06/20/2014 and was noted to have mild focal hypermetabolism at the anorectal junction possibly physiologic was also irregular bladder wall thickening. Patient states that he feels fine. He has not had any change in bowel habits, no melena or hematochezia, no rectal discomfort or pain. Recent CEA has gone from 2.7-6.6. Patient has history of DVT and pulmonary embolus in 2011 and has been on chronic Coumadin, also with history of thyroidism, renal insufficiency, hypertension, and morbid obesity.  Review of Systems Pertinent positive and negative review of systems were noted in the above HPI section.  All other review of systems was otherwise negative.  Outpatient Encounter Prescriptions as of 07/07/2014  Medication Sig  . aspirin EC 81 MG tablet Take 81 mg by mouth daily.  [name] atenolol (TENORMIN) 100 MG tablet Take 100 mg by mouth every evening.   . Cholecalciferol (VITAMIN D3) 5000 UNITS TABS Take 1 capsule by mouth daily.  [name] doxazosin (CARDURA) 2 MG tablet Take 2 mg by mouth at bedtime.  [name] ezetimibe (ZETIA) 10 MG tablet Take 10 mg by mouth daily.  . furosemide (LASIX) 20 MG tablet Take 10 mg by  mouth daily. Takes 10 mg daily  . levothyroxine (SYNTHROID, LEVOTHROID) 75 MCG tablet Take 75 mcg by mouth daily before breakfast.  . simvastatin (ZOCOR) 40 MG tablet Take 40 mg by mouth every evening.  . warfarin (COUMADIN) 5 MG tablet TAKE AS DIRECTED BY ANTICOAGULATION CLINIC  . MOVIPREP 100 G SOLR Take 1 kit (200 g total) by mouth once.  . [DISCONTINUED] Vitamin D, Ergocalciferol, (DRISDOL) 50000 UNITS CAPS capsule Take 50,000 Units by mouth daily.    No Known Allergies Patient Active Problem List   Diagnosis Date Noted  . Encounter for therapeutic drug monitoring 09/01/2013  . Rectal cancer, pT3pN0 (0/12 LN) - mid rectum 12/19/2011  . Clotting disorder   . Benign neoplasm of colon 10/08/2011  . Iron deficiency anemia, unspecified 09/12/2011  . DVT (deep venous thrombosis) 10/09/2010  . PULMONARY EMBOLISM AND INFARCTION 02/22/2010  . HYPOTHYROIDISM 02/21/2010  . DYSLIPIDEMIA 02/21/2010  . Obesity, Class III, BMI 40-49.9 (morbid obesity) 02/21/2010  . HYPERTENSION 02/21/2010  . RENAL DISEASE, CHRONIC, STAGE III 02/21/2010   History   Social History  . Marital Status: Single    Spouse Name: N/A    Number of Children: N/A  . Years of Education: N/A   Occupational History  . MERCHANT     SELF EMPLOYED   Social History Main Topics  . Smoking status: Never Smoker   . Smokeless tobacco: Never Used  . Alcohol Use: No  . Drug Use: No  .  Sexual Activity: Not on file   Other Topics Concern  . Not on file   Social History Narrative    Richard Warner's family history includes Cancer in his paternal grandfather; Diabetes in his father; Heart disease in his father; Hypertension in his paternal grandfather; Lung cancer in his paternal grandfather; Stroke in his maternal grandfather and maternal grandmother.      Objective:    Filed Vitals:   07/07/14 1001  BP: 142/64  Pulse: 64    Physical Exam  very pleasant morbidly obese older white male in no acute distress. Height  5 foot 11 weight 369 BMI 51.4. HEENT; nontraumatic normocephalic EOMI PERRLA sclera anicteric, Supple; no JVD, Cardiovascular; regular rate and rhythm with S1-S2 no murmur rub or gallop, Pulmonary; clear bilaterally though decreased breath sounds, Abdomen; very large, soft nontender bowel sounds are present no palpable mass or hepatosplenomegaly, Rectal; exam not done, Extremities; no clubbing cyanosis he does have chronic stasis changes bilaterally in the lower extremities, Psych; mood and affect appropriate     Assessment & Plan:   #36 63 year old white male with history of stage II rectal adenocarcinoma diagnosed 2013, status post low anterior resection then chemotherapy and radiation. Recent PET scan showing mild focal hypermetabolism the anorectal junction, possibly physiologic was also no red noted irregular bladder wall thickening. Findings on PET scan may be physiologic but will need to rule out recurrence of rectal cancer #2 chronic anticoagulation with Coumadin #3 history of DVT and pulmonary embolus #4 hypertension #5 hypothyroidism #6 chronic kidney disease stage III #7 morbid obesity #8 adenomatous colon polyps last colonoscopy October 2014  Plan; patient will be scheduled for colonoscopy with Dr. Deatra Warner at the hospital due to BMI greater than 50. Procedure discussed in detail with the patient and he is agreeable to proceed We will obtain consent from his pulmonologist Richard Warner for him to hold Coumadin for 5 days prior to the procedure. Further plans pending results of colonoscopy.   Plan;  Richard Ferguson PA-C 07/07/2014

## 2014-07-27 NOTE — Op Note (Signed)
[facility] [place], [address]   COLONOSCOPY PROCEDURE REPORT     EXAM DATE: 08/16/14  PATIENT NAME:      Richard Warner, Richard Warner           MR #: [id number] BIRTHDATE:       [birth year]      VISIT #:     [id number]  ATTENDING:     Inda Castle, MD     STATUS:     outpatient REFERRING MD:      Arturo Morton, M.D. ASA CLASS:        Class III  INDICATIONS:  The patient is a 63 yr old male here for a colonoscopy due to history of colon cancer.  Recent PET scan raising possibility of local recurrence in the rectal vault. PROCEDURE PERFORMED:     Colonoscopy with snare polypectomy MEDICATIONS:     Monitored anesthesia care ESTIMATED BLOOD LOSS:     None  CONSENT: The patient understands the risks and benefits of the procedure and understands that these risks include, but are not limited to: sedation, allergic reaction, infection, perforation and/or bleeding. Alternative means of evaluation and treatment include, among others: physical exam, x-rays, and/or surgical intervention. The patient elects to proceed with this endoscopic procedure.  DESCRIPTION OF PROCEDURE: During intra-op preparation period all mechanical & medical equipment was checked for proper function. Hand hygiene and appropriate measures for infection prevention was taken. After the risks, benefits and alternatives of the procedure were thoroughly explained, Informed consent was verified, confirmed and timeout was successfully executed by the treatment team. A digital exam revealed no abnormalities of the rectum.      The    endoscope was introduced through the anus and advanced to the cecum, which was identified by both the appendix and ileocecal valve. No adverse events experienced. The prep was Suprep good. The instrument was then slowly withdrawn as the colon was fully examined.   COLON FINDINGS: A sessile polyp measuring 3 mm in size was found in the ascending colon.   A polypectomy was performed with a cold snare.  The resection was complete, the polyp tissue was completely retrieved and sent to histology.   Internal hemorrhoids were found. The examination was otherwise normal. Retroflexed views revealed no abnormalities.  The scope was then completely withdrawn from the patient and the procedure terminated. WITHDRAWAL TIME: 11 minutes 0 seconds    ADVERSE EVENTS:      There were no immediate complications.  IMPRESSIONS:     1.  Sessile polyp was found in the ascending colon; polypectomy was performed with a cold snare 2.  Internal hemorrhoids 3.  The examination was otherwise normal  RECOMMENDATIONS:     Colonoscopy 3 years RECALL:  Inda Castle, MD eSigned:  Inda Castle, MD 08-16-14 10:17 AM   cc:  CPT CODES: ICD CODES:  The ICD and CPT codes recommended by this software are interpretations from the data that the clinical staff has captured with the software.  The verification of the translation of this report to the ICD and CPT codes and modifiers is the sole responsibility of the health care institution and practicing physician where this report was generated.  [employer] will not be held responsible for the validity of the ICD and CPT codes included on this report.  AMA assumes no liability for data contained or not contained herein. CPT is a Designer, television/film set of the [employer].  PATIENT NAME:  Richard Warner, Richard Warner MR#: [id number]

## 2014-07-28 ENCOUNTER — Encounter (HOSPITAL_COMMUNITY): Payer: Self-pay | Admitting: Gastroenterology

## 2014-08-01 ENCOUNTER — Encounter: Payer: Self-pay | Admitting: Gastroenterology

## 2014-08-03 ENCOUNTER — Ambulatory Visit (INDEPENDENT_AMBULATORY_CARE_PROVIDER_SITE_OTHER): Payer: Medicare Other | Admitting: *Deleted

## 2014-08-03 DIAGNOSIS — Z5181 Encounter for therapeutic drug level monitoring: Secondary | ICD-10-CM

## 2014-08-03 DIAGNOSIS — C20 Malignant neoplasm of rectum: Secondary | ICD-10-CM

## 2014-08-03 DIAGNOSIS — I2699 Other pulmonary embolism without acute cor pulmonale: Secondary | ICD-10-CM

## 2014-08-03 DIAGNOSIS — I82409 Acute embolism and thrombosis of unspecified deep veins of unspecified lower extremity: Secondary | ICD-10-CM

## 2014-08-03 LAB — POCT INR: INR: 1.6

## 2014-08-10 ENCOUNTER — Ambulatory Visit (INDEPENDENT_AMBULATORY_CARE_PROVIDER_SITE_OTHER): Payer: Medicare Other

## 2014-08-10 DIAGNOSIS — Z5181 Encounter for therapeutic drug level monitoring: Secondary | ICD-10-CM

## 2014-08-10 DIAGNOSIS — I2699 Other pulmonary embolism without acute cor pulmonale: Secondary | ICD-10-CM

## 2014-08-10 DIAGNOSIS — I82409 Acute embolism and thrombosis of unspecified deep veins of unspecified lower extremity: Secondary | ICD-10-CM

## 2014-08-10 DIAGNOSIS — C20 Malignant neoplasm of rectum: Secondary | ICD-10-CM

## 2014-08-10 LAB — POCT INR: INR: 2

## 2014-08-30 ENCOUNTER — Other Ambulatory Visit: Payer: Self-pay | Admitting: *Deleted

## 2014-08-30 DIAGNOSIS — C20 Malignant neoplasm of rectum: Secondary | ICD-10-CM

## 2014-08-31 ENCOUNTER — Telehealth: Payer: Self-pay | Admitting: Oncology

## 2014-08-31 ENCOUNTER — Ambulatory Visit (HOSPITAL_BASED_OUTPATIENT_CLINIC_OR_DEPARTMENT_OTHER): Payer: Medicare Other | Admitting: Oncology

## 2014-08-31 ENCOUNTER — Other Ambulatory Visit (HOSPITAL_BASED_OUTPATIENT_CLINIC_OR_DEPARTMENT_OTHER): Payer: Medicare Other

## 2014-08-31 ENCOUNTER — Ambulatory Visit (HOSPITAL_BASED_OUTPATIENT_CLINIC_OR_DEPARTMENT_OTHER): Payer: Medicare Other

## 2014-08-31 VITALS — BP 143/68 | HR 63 | Temp 98.0°F | Resp 22 | Ht 71.0 in | Wt 362.3 lb

## 2014-08-31 DIAGNOSIS — C20 Malignant neoplasm of rectum: Secondary | ICD-10-CM

## 2014-08-31 DIAGNOSIS — R312 Other microscopic hematuria: Secondary | ICD-10-CM

## 2014-08-31 DIAGNOSIS — R3129 Other microscopic hematuria: Secondary | ICD-10-CM

## 2014-08-31 LAB — URINALYSIS, MICROSCOPIC - CHCC
Bilirubin (Urine): NEGATIVE
Glucose: NEGATIVE mg/dL
KETONES: NEGATIVE mg/dL
Leukocyte Esterase: NEGATIVE
Nitrite: NEGATIVE
PH: 6 (ref 4.6–8.0)
Protein: NEGATIVE mg/dL
Specific Gravity, Urine: 1.01 (ref 1.003–1.035)
UROBILINOGEN UR: 0.2 mg/dL (ref 0.2–1)
WBC UA: NEGATIVE (ref 0–2)

## 2014-08-31 NOTE — Telephone Encounter (Signed)
Pt confirmed labs/ov per 01/26 POF,  AVS/Calendar given by AB, sent msg to MR to fax information pertaining to referral for Dr. Johney Maine office per staff member and they will contact pt once the MD has review the information... KJ

## 2014-08-31 NOTE — Telephone Encounter (Signed)
Faxed pt medical records to Dr. Johney Maine

## 2014-08-31 NOTE — Progress Notes (Signed)
  [facility] OFFICE PROGRESS NOTE   Diagnosis: Rectal cancer  INTERVAL HISTORY:   He returns as scheduled. He feels well. He underwent a colonoscopy by Dr. Deatra Ina on 07/27/2014. A 3 mm sessile polyp was found in the ascending colon. A polypectomy was performed. There were internal hemorrhoids. The exam was otherwise normal. The polyp returned as a she with her adenoma. No high-grade dysplasia or malignancy. He noted a "pimple "at the right upper anterior chest approximate 1 month ago. He tried to extrude liquid from this lesion and it subsequently grew. He has been applying a topical medication recommended by a pharmacist to "dry "the lesion.  Objective:  Vital signs in last 24 hours:  Blood pressure 143/68, pulse 63, temperature 98 F (36.7 C), temperature source Oral, resp. rate 22, height 5\' 11"  (1.803 m), weight 362 lb 4.8 oz (164.338 kg), SpO2 96 %.    HEENT: Neck without mass. Oral cavity without visible mass. Lymphatics: No cervical, supra-clavicular, axillary, or inguinal nodes Resp: Lungs clear bilaterally Cardio: Regular rate and rhythm GI: No hepatomegaly, low abdomen scar without evidence of recurrent tumor, nontender, no mass Vascular: No leg edema  Skin: Raised 3 cm firm cutaneous mass at the right upper anterior chest wall. The mass appears to be growing out of the skin. Anterior ulceration. The mass appears to be pedunculated on a wide base.   Lab Results:  Lab Results  Component Value Date   CEA 6.6* 07/02/2014    Medications: I have reviewed the patient's current medications.  Assessment/Plan: 1. Rectal cancer, stage II (pT3 pN0) status post low anterior resection 12/14/2011. The tumor was noted to be at 10 cm on a rigid proctoscopy at the time of surgery. He completed radiation and Xeloda from 01/31/2012-03/11/2012. He began a first cycle of adjuvant Xeloda on 04/22/2012. He completed cycle 5 beginning 07/17/2012. He completed cycle 6 beginning  08/08/2012.  Surveillance colonoscopy 05/15/2013, status post removal of tubular adenomas. Next colonoscopy 3 years.  Elevated CEA 06/18/2014  PET scan 06/30/2014 with mild focal hypermetabolism at the anorectal junction favored to be physiologic in the absence of associated CT abnormality; irregular bladder wall thickening.  Colonoscopy 07/27/2014 with removal of a sessile ascending colon polyp-tubular adenoma, no other significant findings 2. History of iron deficiency anemia likely secondary to rectal bleeding.  3. 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 He is maintained on Coumadin.  4. Renal insufficiency.  5. Hypertension. 6. Chronic mild thrombocytopenia 7. Hypothyroid. 8. Microscopic hematuria on a urinalysis 07/02/2014 9. Right anterior chest wall skin lesion/mass   Disposition:  Mr. Dismore appears well. He has undergone an extensive diagnostic evaluation for the elevated CEA including a PET scan and colonoscopy without evidence of recurrent or new colorectal cancer. A repeat urinalysis today revealed 0-2 red cells. I have a low clinical suspicion for a urinary tract malignancy.  He has a raised firm skin lesion at the right anterior chest on exam today. I am suspicious this could represent a metastasis. He will be referred to Dr. Johney Maine for a biopsy. Mr. Sally will return for an office visit here an approximate 3 weeks. Betsy Coder, MD  08/31/2014  1:17 PM

## 2014-09-01 LAB — CEA: CEA: 8.6 ng/mL — ABNORMAL HIGH (ref 0.0–5.0)

## 2014-09-07 ENCOUNTER — Other Ambulatory Visit (INDEPENDENT_AMBULATORY_CARE_PROVIDER_SITE_OTHER): Payer: Self-pay | Admitting: Surgery

## 2014-09-07 ENCOUNTER — Ambulatory Visit (INDEPENDENT_AMBULATORY_CARE_PROVIDER_SITE_OTHER): Payer: Medicare Other

## 2014-09-07 DIAGNOSIS — Z5181 Encounter for therapeutic drug level monitoring: Secondary | ICD-10-CM

## 2014-09-07 DIAGNOSIS — I2699 Other pulmonary embolism without acute cor pulmonale: Secondary | ICD-10-CM

## 2014-09-07 DIAGNOSIS — I82409 Acute embolism and thrombosis of unspecified deep veins of unspecified lower extremity: Secondary | ICD-10-CM

## 2014-09-07 DIAGNOSIS — C20 Malignant neoplasm of rectum: Secondary | ICD-10-CM

## 2014-09-07 LAB — POCT INR: INR: 2.6

## 2014-09-07 MED ORDER — WARFARIN SODIUM 5 MG PO TABS: ORAL_TABLET | ORAL | Status: DC

## 2014-09-07 NOTE — H&P (Signed)
Richard Warner. Richard Warner 09/07/2014 12:48 PM Location: [facility] Patient #: [id number] DOB: [birth year] Single / Language: Richard Warner / Race: White Male History of Present Illness [name] MD; 09/07/2014 1:13 PM) Patient words: chest lesion hx of rectal ca Elevated CEA.  The patient is a 64 year old male who presents with a soft tissue mass. Pleasant morbidly obese male fully anticoagulated for PE. History of rectal cancer status post low anterior resection 2013. Had pre-and post-operative chemotherapy. Completed chemotherapy January 2014. Follow-up colonoscopy in a year showed a few adenomas. No cancer. His CEA became elevated a few months ago. PET scan revealed mild signalling at the anastomosis. Endoscopy noted normal anastomosis. A few small polyps. Removed. This was late December. Patient incidentally noted mass on her right chest wall. Thought it was a ruptured cyst. However getting larger. More ulcerated. Not particularly painful. Some mild bleeding with epinephrine not severe. No major bleeding issues on his warfarin. Usually comes off his warfarin for procedures without problems. Other Problems Richard Warner [place], [place]; 09/07/2014 12:48 PM) Colon Cancer High blood pressure Hypercholesterolemia Kidney Richard Warner  Past Surgical History Richard Warner [place], [place]; 09/07/2014 12:48 PM) Colon Polyp Removal - Colonoscopy  Diagnostic Studies History Richard Warner [place], [place]; 09/07/2014 12:48 PM) Colonoscopy within last year  Allergies Richard Warner [place]; 09/07/2014 12:50 PM) No Known Drug Allergies 09/07/2014  Medication History (Richard [place], [place]; 09/07/2014 12:51 PM) Atenolol (100MG  Tablet, Oral) Active. Doxazosin Mesylate (2MG  Tablet, Oral) Active. Zetia (10MG  Tablet, Oral) Active. Furosemide (20MG  Tablet, Oral) Active. Levothyroxine Sodium (75MCG Tablet, Oral) Active. Simvastatin (40MG  Tablet, Oral) Active. Warfarin Sodium (5MG  Tablet, Oral) Active. Baby  Aspirin (81MG  Tablet Chewable, Oral) Active. Vitamin D3 (50000UNIT Capsule, Oral) Active. Medications Reconciled  Social History Richard Warner [place], [place]; 09/07/2014 12:48 PM) Caffeine use Carbonated beverages. No alcohol use No drug use Tobacco use Never smoker.  Family History Richard Warner [place], [place]; 09/07/2014 12:48 PM) Alcohol Abuse Father. Hypertension Father, Mother.     Review of Systems Richard Warner [place]; 09/07/2014 12:48 PM) General Not Present- Appetite Loss, Chills, Fatigue, Fever, Night Sweats, Weight Gain and Weight Loss. Skin Present- New Lesions. Not Present- Change in Wart/Mole, Dryness, Hives, Jaundice, Non-Healing Wounds, Rash and Ulcer. HEENT Not Present- Earache, Hearing Loss, Hoarseness, Nose Bleed, Oral Ulcers, Ringing in the Ears, Seasonal Allergies, Sinus Pain, Sore Throat, Visual Disturbances, Wears glasses/contact lenses and Yellow Eyes. Respiratory Not Present- Bloody sputum, Chronic Cough, Difficulty Breathing, Snoring and Wheezing. Breast Not Present- Breast Mass, Breast Pain, Nipple Discharge and Skin Changes. Cardiovascular Present- Swelling of Extremities. Not Present- Chest Pain, Difficulty Breathing Lying Down, Leg Cramps, Palpitations, Rapid Heart Rate and Shortness of Breath. Gastrointestinal Not Present- Abdominal Pain, Bloating, Bloody Stool, Change in Bowel Habits, Chronic diarrhea, Constipation, Difficulty Swallowing, Excessive gas, Gets full quickly at meals, Hemorrhoids, Indigestion, Nausea, Rectal Pain and Vomiting. Male Genitourinary Not Present- Blood in Urine, Change in Urinary Stream, Frequency, Impotence, Nocturia, Painful Urination, Urgency and Urine Leakage.  Vitals (Richard [place] MA; 09/07/2014 12:49 PM) 09/07/2014 12:49 PM Weight: 366 lb Height: 71in Body Surface Area: 2.88 m Body Mass Index: 51.05 kg/m Pulse: 66 (Regular)  BP: 144/72 (Sitting, Left Arm, Standard)     Physical Exam [name] MD; 09/07/2014 1:13  PM)  General Mental Status-Alert. General Appearance-Not in acute distress, Not Sickly. Orientation-Oriented X3. Hydration-Well hydrated. Voice-Normal. Note: Morbidly obese with Apple body habitus. Moves around well.   Integumentary Global Assessment Upon inspection and palpation of skin surfaces of the - Axillae: non-tender, no inflammation or ulceration,  no drainage. and Distribution of scalp and body hair is normal. General Characteristics Temperature - normal warmth is noted. Note: 4x3x2cm raised pedunculated ulcerated mass on infraclavicular fossa anterior chest wall   Head and Neck Head-normocephalic, atraumatic with no lesions or palpable masses. Face Global Assessment - atraumatic, no absence of expression. Neck Global Assessment - no abnormal movements, no bruit auscultated on the right, no bruit auscultated on the left, no decreased range of motion, non-tender. Trachea-midline. Thyroid Gland Characteristics - non-tender.  Eye Eyeball - Left-Extraocular movements intact, No Nystagmus. Eyeball - Right-Extraocular movements intact, No Nystagmus. Cornea - Left-No Hazy. Cornea - Right-No Hazy. Sclera/Conjunctiva - Left-No scleral icterus, No Discharge. Sclera/Conjunctiva - Right-No scleral icterus, No Discharge. Pupil - Left-Direct reaction to light normal. Pupil - Right-Direct reaction to light normal.  ENMT Ears Pinna - Left - no drainage observed, no generalized tenderness observed. Right - no drainage observed, no generalized tenderness observed. Nose and Sinuses External Inspection of the Nose - no destructive lesion observed. Inspection of the nares - Left - quiet respiration. Right - quiet respiration. Mouth and Throat Lips - Upper Lip - no fissures observed, no pallor noted. Lower Lip - no fissures observed, no pallor noted. Nasopharynx - no discharge present. Oral Cavity/Oropharynx - Tongue - no dryness observed. Oral Mucosa -  no cyanosis observed. Hypopharynx - no evidence of airway distress observed.  Chest and Lung Exam Inspection Movements - Normal and Symmetrical. Accessory muscles - No use of accessory muscles in breathing. Palpation Palpation of the chest reveals - Non-tender. Auscultation Breath sounds - Normal and Clear.  Cardiovascular Auscultation Rhythm - Regular. Murmurs & Other Heart Sounds - Auscultation of the heart reveals - No Murmurs and No Systolic Clicks.  Abdomen Inspection Inspection of the abdomen reveals - No Visible peristalsis and No Abnormal pulsations. Umbilicus - No Bleeding, No Urine drainage. Palpation/Percussion Palpation and Percussion of the abdomen reveal - Soft, Non Tender, No Rebound tenderness, No Rigidity (guarding) and No Cutaneous hyperesthesia.  Male Genitourinary Sexual Maturity Tanner 5 - Adult hair pattern and Adult penile size and shape.  Rectal Note: Rectal examination deferred given colonoscopy 5 weeks ago.   Peripheral Vascular Upper Extremity Inspection - Left - No Cyanotic nailbeds, Not Ischemic. Right - No Cyanotic nailbeds, Not Ischemic.  Neurologic Neurologic evaluation reveals -normal attention span and ability to concentrate, able to name objects and repeat phrases. Appropriate fund of knowledge , normal sensation and normal coordination. Mental Status Affect - not angry, not paranoid. Cranial Nerves-Normal Bilaterally. Gait-Normal.  Neuropsychiatric Mental status exam performed with findings of-able to articulate well with normal speech/language, rate, volume and coherence, thought content normal with ability to perform basic computations and apply abstract reasoning and no evidence of hallucinations, delusions, obsessions or homicidal/suicidal ideation.  Musculoskeletal Global Assessment Spine, Ribs and Pelvis - no instability, subluxation or laxity. Right Upper Extremity - no instability, subluxation or  laxity.  Lymphatic Head & Neck  General Head & Neck Lymphatics: Bilateral - Description - No Localized lymphadenopathy. Axillary  General Axillary Region: Bilateral - Description - No Localized lymphadenopathy. Femoral & Inguinal  Generalized Femoral & Inguinal Lymphatics: Left - Description - No Localized lymphadenopathy. Right - Description - No Localized lymphadenopathy.    Assessment & Plan [name] MD; 09/07/2014 1:11 PM)  CHEST WALL MASS (786.6  R22.2) Impression: Firm ulcerated mass suspicious for skin cancer. Metastasis from rectal cancer seems less likely but not unheard of. Either way, I think this warrants excision. Given its size  and his Foley anticoagulation, would best be do this in the operating room in an outpatient setting.  The partial diagnosis was discussed. Techniques Ris benefits alternatives discussed. Most likely to do primary closure. Increased risk of bleeding and hematoma formation discussed as well. He agrees.  Should be able to come off warfarin perioperatively as he is done many times before for endoscopies etc.  Current Plans Schedule for Surgery  The pathophysiology of skin & subcutaneous masses was discussed.  Natural history risks without surgery were discussed.  I recommended surgery to remove the mass.  I explained the technique of removal with use of local anesthesia & possible need for more aggressive sedation/anesthesia for patient comfort.    Risks such as bleeding, infection, wound breakdown, heart attack, death, and other risks were discussed.  I noted a good likelihood this will help address the problem.   Possibility that this will not correct all symptoms was explained. Possibility of regrowth/recurrence of the mass was discussed.  We will work to minimize complications. Questions were answered.  The patient expresses understanding & wishes to proceed with surgery.

## 2014-09-09 NOTE — Patient Instructions (Addendum)
DEVONTE MIGUES  09/09/2014   Your procedure is scheduled on: 09/17/2014    Report to [facility] Main  Entrance and follow signs to               [place] at      0700 AM.  Call this number if you have problems the morning of surgery [phone]   Remember:  Do not eat food or drink liquids :After Midnight.     Take these medicines the morning of surgery with A SIP OF WATER: synthroid                                You may not have any metal on your body including hair pins and              piercings  Do not wear jewelry,  lotions, powders or perfumes., deodorant                            Men may shave face and neck.   Do not bring valuables to the hospital. [place].  Contacts, dentures or bridgework may not be worn into surgery.       Patients discharged the day of surgery will not be allowed to drive home.  Name and phone number of your driver:  Special Instructions: coughing and deep breathing exercises, leg exercises               Please read over the following fact sheets you were given: _____________________________________________________________________             [facility] - Preparing for Surgery Before surgery, you can play an important role.  Because skin is not sterile, your skin needs to be as free of germs as possible.  You can reduce the number of germs on your skin by washing with CHG (chlorahexidine gluconate) soap before surgery.  CHG is an antiseptic cleaner which kills germs and bonds with the skin to continue killing germs even after washing. Please DO NOT use if you have an allergy to CHG or antibacterial soaps.  If your skin becomes reddened/irritated stop using the CHG and inform your nurse when you arrive at Short Stay. Do not shave (including legs and underarms) for at least 48 hours prior to the first CHG shower.  You may shave your face/neck. Please follow these  instructions carefully:  1.  Shower with CHG Soap the night before surgery and the  morning of Surgery.  2.  If you choose to wash your hair, wash your hair first as usual with your  normal  shampoo.  3.  After you shampoo, rinse your hair and body thoroughly to remove the  shampoo.                           4.  Use CHG as you would any other liquid soap.  You can apply chg directly  to the skin and wash                       Gently with a scrungie or clean washcloth.  5.  Apply the  CHG Soap to your body ONLY FROM THE NECK DOWN.   Do not use on face/ open                           Wound or open sores. Avoid contact with eyes, ears mouth and genitals (private parts).                       Wash face,  Genitals (private parts) with your normal soap.             6.  Wash thoroughly, paying special attention to the area where your surgery  will be performed.  7.  Thoroughly rinse your body with warm water from the neck down.  8.  DO NOT shower/wash with your normal soap after using and rinsing off  the CHG Soap.                9.  Pat yourself dry with a clean towel.            10.  Wear clean pajamas.            11.  Place clean sheets on your bed the night of your first shower and do not  sleep with pets. Day of Surgery : Do not apply any lotions/deodorants the morning of surgery.  Please wear clean clothes to the hospital/surgery center.  FAILURE TO FOLLOW THESE INSTRUCTIONS MAY RESULT IN THE CANCELLATION OF YOUR SURGERY PATIENT SIGNATURE_________________________________  NURSE SIGNATURE__________________________________  ________________________________________________________________________

## 2014-09-13 ENCOUNTER — Encounter (HOSPITAL_COMMUNITY): Payer: Self-pay

## 2014-09-13 ENCOUNTER — Encounter (INDEPENDENT_AMBULATORY_CARE_PROVIDER_SITE_OTHER): Payer: Self-pay

## 2014-09-13 ENCOUNTER — Encounter (HOSPITAL_COMMUNITY)
Admission: RE | Admit: 2014-09-13 | Discharge: 2014-09-13 | Disposition: A | Discharge status: Home / Self Care | Payer: Medicare Other | Source: Ambulatory Visit | Attending: Surgery | Admitting: Surgery

## 2014-09-13 ENCOUNTER — Ambulatory Visit (HOSPITAL_COMMUNITY)
Admission: RE | Admit: 2014-09-13 | Discharge: 2014-09-13 | Disposition: A | Discharge status: Home / Self Care | Payer: Medicare Other | Source: Ambulatory Visit | Attending: Anesthesiology | Admitting: Anesthesiology

## 2014-09-13 DIAGNOSIS — I10 Essential (primary) hypertension: Secondary | ICD-10-CM | POA: Diagnosis not present

## 2014-09-13 DIAGNOSIS — Z01818 Encounter for other preprocedural examination: Secondary | ICD-10-CM

## 2014-09-13 LAB — BASIC METABOLIC PANEL
ANION GAP: 7 (ref 5–15)
BUN: 25 mg/dL — ABNORMAL HIGH (ref 6–23)
CHLORIDE: 106 mmol/L (ref 96–112)
CO2: 27 mmol/L (ref 19–32)
CREATININE: 1.54 mg/dL — AB (ref 0.50–1.35)
Calcium: 9.4 mg/dL (ref 8.4–10.5)
GFR calc Af Amer: 53 mL/min — ABNORMAL LOW (ref >90)
GFR, EST NON AFRICAN AMERICAN: 46 mL/min — AB (ref >90)
Glucose, Bld: 124 mg/dL — ABNORMAL HIGH (ref 70–99)
Potassium: 4.9 mmol/L (ref 3.5–5.1)
SODIUM: 140 mmol/L (ref 135–145)

## 2014-09-13 LAB — CBC
HEMATOCRIT: 44 % (ref 39.0–52.0)
Hemoglobin: 15 g/dL (ref 13.0–17.0)
MCH: 30.2 pg (ref 26.0–34.0)
MCHC: 34.1 g/dL (ref 30.0–36.0)
MCV: 88.7 fL (ref 78.0–100.0)
PLATELETS: 128 10*3/uL — AB (ref 150–400)
RBC: 4.96 MIL/uL (ref 4.22–5.81)
RDW: 13.2 % (ref 11.5–15.5)
WBC: 5.2 10*3/uL (ref 4.0–10.5)

## 2014-09-13 LAB — APTT: aPTT: 38 seconds — ABNORMAL HIGH (ref 24–37)

## 2014-09-13 LAB — PROTIME-INR
INR: 2.09 — ABNORMAL HIGH (ref 0.00–1.49)
Prothrombin Time: 23.7 seconds — ABNORMAL HIGH (ref 11.6–15.2)

## 2014-09-13 NOTE — Progress Notes (Signed)
Your patient scored a 5 on the STOP BANG TOOL Assessment TOOl for Obstructive Sleep Apnea during a preop appointment.    This patient is at an elevated risk for Obstructive Sleep Apnea using this Tool.

## 2014-09-13 NOTE — Progress Notes (Signed)
Cbc, cmet, pt, ptt, results routed to dr Vernia Buff inbasket by epic

## 2014-09-13 NOTE — Progress Notes (Signed)
10/2011 LOV with Dr Acie Fredrickson - EPIC  03/08/2014  LOV in EPIC with Dr Melvyn Novas  2013 ECHO in [facility]

## 2014-09-17 ENCOUNTER — Encounter (HOSPITAL_COMMUNITY): Payer: Self-pay | Admitting: *Deleted

## 2014-09-17 ENCOUNTER — Ambulatory Visit (HOSPITAL_COMMUNITY): Payer: Medicare Other | Admitting: Anesthesiology

## 2014-09-17 ENCOUNTER — Encounter (HOSPITAL_COMMUNITY)
Admission: RE | Disposition: A | Discharge status: Home / Self Care | Payer: Self-pay | Source: Ambulatory Visit | Attending: Surgery

## 2014-09-17 ENCOUNTER — Ambulatory Visit (HOSPITAL_COMMUNITY)
Admission: RE | Admit: 2014-09-17 | Discharge: 2014-09-17 | Disposition: A | Discharge status: Home / Self Care | Payer: Medicare Other | Source: Ambulatory Visit | Attending: Surgery | Admitting: Surgery

## 2014-09-17 DIAGNOSIS — Z6841 Body Mass Index (BMI) 40.0 and over, adult: Secondary | ICD-10-CM | POA: Diagnosis not present

## 2014-09-17 DIAGNOSIS — R970 Elevated carcinoembryonic antigen [CEA]: Secondary | ICD-10-CM | POA: Insufficient documentation

## 2014-09-17 DIAGNOSIS — Z85048 Personal history of other malignant neoplasm of rectum, rectosigmoid junction, and anus: Secondary | ICD-10-CM | POA: Insufficient documentation

## 2014-09-17 DIAGNOSIS — R222 Localized swelling, mass and lump, trunk: Secondary | ICD-10-CM | POA: Diagnosis present

## 2014-09-17 DIAGNOSIS — Z86711 Personal history of pulmonary embolism: Secondary | ICD-10-CM | POA: Insufficient documentation

## 2014-09-17 DIAGNOSIS — Z86718 Personal history of other venous thrombosis and embolism: Secondary | ICD-10-CM | POA: Diagnosis not present

## 2014-09-17 DIAGNOSIS — E780 Pure hypercholesterolemia: Secondary | ICD-10-CM | POA: Diagnosis not present

## 2014-09-17 DIAGNOSIS — I10 Essential (primary) hypertension: Secondary | ICD-10-CM | POA: Insufficient documentation

## 2014-09-17 DIAGNOSIS — C44529 Squamous cell carcinoma of skin of other part of trunk: Secondary | ICD-10-CM | POA: Diagnosis not present

## 2014-09-17 DIAGNOSIS — E039 Hypothyroidism, unspecified: Secondary | ICD-10-CM | POA: Insufficient documentation

## 2014-09-17 HISTORY — PX: CYST REMOVAL TRUNK: SHX6283

## 2014-09-17 LAB — PROTIME-INR
INR: 1.16 (ref 0.00–1.49)
PROTHROMBIN TIME: 14.9 s (ref 11.6–15.2)

## 2014-09-17 SURGERY — CYST REMOVAL TRUNK
Anesthesia: Monitor Anesthesia Care

## 2014-09-17 MED ORDER — HYDROMORPHONE HCL 1 MG/ML IJ SOLN: 0.2500 mg | INTRAMUSCULAR | Status: DC | PRN

## 2014-09-17 MED ORDER — FENTANYL CITRATE 0.05 MG/ML IJ SOLN
INTRAMUSCULAR | Status: DC | PRN
  Administered 2014-09-17 (×3): 50 ug via INTRAVENOUS

## 2014-09-17 MED ORDER — CHLORHEXIDINE GLUCONATE 4 % EX LIQD: 1.0000 "application " | Freq: Once | CUTANEOUS | Status: DC

## 2014-09-17 MED ORDER — BUPIVACAINE-EPINEPHRINE 0.25% -1:200000 IJ SOLN
INTRAMUSCULAR | Status: DC | PRN
  Administered 2014-09-17: 50 mL

## 2014-09-17 MED ORDER — ONDANSETRON HCL 4 MG/2ML IJ SOLN
INTRAMUSCULAR | Status: DC | PRN
  Administered 2014-09-17: 4 mg via INTRAVENOUS

## 2014-09-17 MED ORDER — DEXAMETHASONE SODIUM PHOSPHATE 10 MG/ML IJ SOLN
INTRAMUSCULAR | Status: AC
  Filled 2014-09-17: qty 1

## 2014-09-17 MED ORDER — MIDAZOLAM HCL 5 MG/5ML IJ SOLN
INTRAMUSCULAR | Status: DC | PRN
  Administered 2014-09-17 (×2): 1 mg via INTRAVENOUS

## 2014-09-17 MED ORDER — PROMETHAZINE HCL 25 MG/ML IJ SOLN: 6.2500 mg | INTRAMUSCULAR | Status: DC | PRN

## 2014-09-17 MED ORDER — HYDROCODONE-ACETAMINOPHEN 5-325 MG PO TABS: 1.0000 | ORAL_TABLET | Freq: Four times a day (QID) | ORAL | Status: DC | PRN

## 2014-09-17 MED ORDER — PROPOFOL 10 MG/ML IV BOLUS
INTRAVENOUS | Status: AC
  Filled 2014-09-17: qty 20

## 2014-09-17 MED ORDER — LIDOCAINE HCL (CARDIAC) 20 MG/ML IV SOLN
INTRAVENOUS | Status: DC | PRN
  Administered 2014-09-17: 100 mg via INTRAVENOUS

## 2014-09-17 MED ORDER — PROPOFOL 10 MG/ML IV BOLUS
INTRAVENOUS | Status: DC | PRN
  Administered 2014-09-17: 200 mg via INTRAVENOUS

## 2014-09-17 MED ORDER — MEPERIDINE HCL 50 MG/ML IJ SOLN: 6.2500 mg | INTRAMUSCULAR | Status: DC | PRN

## 2014-09-17 MED ORDER — LIDOCAINE HCL (CARDIAC) 20 MG/ML IV SOLN
INTRAVENOUS | Status: AC
  Filled 2014-09-17: qty 5

## 2014-09-17 MED ORDER — ONDANSETRON HCL 4 MG/2ML IJ SOLN
INTRAMUSCULAR | Status: AC
  Filled 2014-09-17: qty 2

## 2014-09-17 MED ORDER — LACTATED RINGERS IV SOLN
INTRAVENOUS | Status: DC
  Administered 2014-09-17: 1000 mL via INTRAVENOUS
  Administered 2014-09-17: 10:00:00 via INTRAVENOUS

## 2014-09-17 MED ORDER — FENTANYL CITRATE 0.05 MG/ML IJ SOLN
INTRAMUSCULAR | Status: AC
  Filled 2014-09-17: qty 5

## 2014-09-17 MED ORDER — DEXTROSE 5 % IV SOLN
3.0000 g | INTRAVENOUS | Status: AC
  Administered 2014-09-17: 3 g via INTRAVENOUS
  Filled 2014-09-17: qty 3000

## 2014-09-17 MED ORDER — MIDAZOLAM HCL 2 MG/2ML IJ SOLN
INTRAMUSCULAR | Status: AC
  Filled 2014-09-17: qty 2

## 2014-09-17 SURGICAL SUPPLY — 33 items
APL SKNCLS STERI-STRIP NONHPOA (GAUZE/BANDAGES/DRESSINGS)
BENZOIN TINCTURE PRP APPL 2/3 (GAUZE/BANDAGES/DRESSINGS) IMPLANT
BLADE HEX COATED 2.75 (ELECTRODE) ×3 IMPLANT
CLOSURE WOUND 1/2 X4 (GAUZE/BANDAGES/DRESSINGS) ×1
DECANTER SPIKE VIAL GLASS SM (MISCELLANEOUS) ×2 IMPLANT
DRAPE LAPAROTOMY T 102X78X121 (DRAPES) IMPLANT
DRAPE LAPAROTOMY TRNSV 102X78 (DRAPE) ×2 IMPLANT
DRAPE PED LAPAROTOMY (DRAPES) ×2 IMPLANT
DRSG TEGADERM 4X4.75 (GAUZE/BANDAGES/DRESSINGS) ×2 IMPLANT
ELECT REM PT RETURN 9FT ADLT (ELECTROSURGICAL) ×3
ELECTRODE REM PT RTRN 9FT ADLT (ELECTROSURGICAL) ×1 IMPLANT
GAUZE SPONGE 2X2 8PLY STRL LF (GAUZE/BANDAGES/DRESSINGS) IMPLANT
GAUZE SPONGE 4X4 12PLY STRL (GAUZE/BANDAGES/DRESSINGS) ×3 IMPLANT
GLOVE BIOGEL PI IND STRL 7.0 (GLOVE) ×1 IMPLANT
GLOVE BIOGEL PI INDICATOR 7.0 (GLOVE) ×2
GLOVE ECLIPSE 8.0 STRL XLNG CF (GLOVE) ×3 IMPLANT
GLOVE INDICATOR 8.0 STRL GRN (GLOVE) ×6 IMPLANT
GOWN STRL REUS W/TWL LRG LVL3 (GOWN DISPOSABLE) ×3 IMPLANT
GOWN STRL REUS W/TWL XL LVL3 (GOWN DISPOSABLE) ×6 IMPLANT
KIT BASIN OR (CUSTOM PROCEDURE TRAY) ×3 IMPLANT
NEEDLE HYPO 22GX1.5 SAFETY (NEEDLE) ×3 IMPLANT
NS IRRIG 1000ML POUR BTL (IV SOLUTION) ×3 IMPLANT
PACK GENERAL/GYN (CUSTOM PROCEDURE TRAY) ×3 IMPLANT
PEN SKIN MARKING BROAD (MISCELLANEOUS) IMPLANT
SPONGE GAUZE 2X2 STER 10/PKG (GAUZE/BANDAGES/DRESSINGS) ×2
SPONGE LAP 4X18 X RAY DECT (DISPOSABLE) IMPLANT
STRIP CLOSURE SKIN 1/2X4 (GAUZE/BANDAGES/DRESSINGS) ×1 IMPLANT
SUT MNCRL AB 4-0 PS2 18 (SUTURE) ×2 IMPLANT
SUT VIC AB 3-0 SH 18 (SUTURE) ×2 IMPLANT
SUT VIC AB 3-0 SH 27 (SUTURE) ×3
SUT VIC AB 3-0 SH 27XBRD (SUTURE) IMPLANT
SYR 20CC LL (SYRINGE) ×3 IMPLANT
TOWEL OR 17X26 10 PK STRL BLUE (TOWEL DISPOSABLE) ×3 IMPLANT

## 2014-09-17 NOTE — Anesthesia Postprocedure Evaluation (Signed)
Anesthesia Post Note  Patient: Richard Warner  Procedure(s) Performed: Procedure(s) (LRB): REMOVAL OF ANTERIOR CHEST WALL SKIN MASS (N/A)  Anesthesia type: General  Patient location: PACU  Post pain: Pain level controlled  Post assessment: Post-op Vital signs reviewed  Last Vitals: BP 134/63 mmHg  Pulse 59  Temp(Src) 36.4 C (Oral)  Resp 16  Ht 5\' 11"  (1.803 m)  Wt 364 lb (165.109 kg)  BMI 50.79 kg/m2  SpO2 96%  Post vital signs: Reviewed  Level of consciousness: sedated  Complications: No apparent anesthesia complications

## 2014-09-17 NOTE — Op Note (Signed)
09/17/2014  10:06 AM  PATIENT:  Richard Warner  64 y.o. male  Patient Care Team: Rachell Cipro, MD as PCP - General (Family Medicine) Inda Castle, MD as Consulting Physician (Gastroenterology) Tanda Rockers, MD as Consulting Physician (Pulmonary Disease) Lelon Perla, MD as Consulting Physician (Cardiology) Ladell Pier, MD as Consulting Physician (Medical Oncology) Jodelle Coltan Spinello, MD as Consulting Physician (Radiation Oncology) Michael Boston, MD (General Surgery)  PRE-OPERATIVE DIAGNOSIS:  Chest Wall Mass  POST-OPERATIVE DIAGNOSIS:  Chest wall mass  PROCEDURE:  Procedure(s): REMOVAL OF ANTERIOR CHEST WALL SKIN MASS  SURGEON:  Surgeon(s): Michael Boston, MD  ASSISTANT: RN   ANESTHESIA:   local and general  EBL:  Total I/O In: 700 [I.V.:700] Out: -   Delay start of Pharmacological VTE agent (>24hrs) due to surgical blood loss or risk of bleeding:  no  DRAINS: none   SPECIMEN:  Source of Specimen:  Right anterior skin/subcutaneous chest wall mass  DISPOSITION OF SPECIMEN:  PATHOLOGY  COUNTS:  YES  PLAN OF CARE: Discharge to home after PACU  PATIENT DISPOSITION:  PACU - hemodynamically stable.  INDICATION: Pleasant morbidly obese male.  History of rectal cancer.  Elevated CEA.  PET scan negative for any metastatic disease.  However patient has had an enlarging chest wall mass ulcerating on the skin in the infraclavicular space of the right chest wall anteriorly.  Based on concerns, excision was recommended.  Technique risk benefits alternatives discussed.  Questions answered and agreed to proceed.  OR FINDINGS: Ulcerated elevated skin mass going into subcutaneous tissues.  3 by 2 x 2 centimeters in size.  1 cm rim of erythema  DESCRIPTION:   Informed consent was confirmed.   The patient received IV antibiotics & underwent general anesthesia without any difficulty. The patient was positioned supine. SCDs were active during the entire case.  The chest was  prepped and draped in a sterile fashion.  A surgical timeout confirmed our plan.  I excised the chest wall mass using an oblique elliptical incision along the skin lines in the infraclavicular fossa.  1 cm to 2 cm margins.  Went completely through the dermis and the SQ tissues.  Went down nearly to the level of pectoralis fascia for good margins.  I assured hemostasis.  I reapproximated the 6.5 x 3.5cm wound using interrupted 3-0 Vicryl deep dermal sutures.  I closed the skin using a running 4-0 Monocryl subcuticular stitch. Sterile dressing was applied. Patient is going to recovery room in stable condition. I anticipate her to leave later today.  I discussed postoperative care with the patient in my office. I discussed in the holding area. Instructions are written. I am about to discuss it with her family as well  [name], M.D., F.A.C.S. Gastrointestinal and Minimally Invasive Surgery [facility] [address], [place], [place] [address] [phone] Main / Paging

## 2014-09-17 NOTE — Anesthesia Procedure Notes (Signed)
Procedure Name: LMA Insertion Date/Time: 09/17/2014 9:29 AM Performed by: Ofilia Neas Pre-anesthesia Checklist: Patient identified, Timeout performed, Emergency Drugs available, Patient being monitored and Suction available Patient Re-evaluated:Patient Re-evaluated prior to inductionOxygen Delivery Method: Circle system utilized Preoxygenation: Pre-oxygenation with 100% oxygen Intubation Type: IV induction LMA: LMA inserted LMA Size: 4.0 Number of attempts: 1 Placement Confirmation: positive ETCO2 Tube secured with: Tape Dental Injury: Teeth and Oropharynx as per pre-operative assessment

## 2014-09-17 NOTE — Interval H&P Note (Signed)
History and Physical Interval Note:  09/17/2014 8:47 AM  Richard Warner  has presented today for surgery, with the diagnosis of Chest Wall Mass  The various methods of treatment have been discussed with the patient and family. After consideration of risks, benefits and other options for treatment, the patient has consented to  Procedure(s): REMOVAL OF ANTERIOR CHEST WALL SKIN MASS (N/A) as a surgical intervention .  The patient's history has been reviewed, patient examined, no change in status, stable for surgery.  I have reviewed the patient's chart and labs.  Questions were answered to the patient's satisfaction.     Leonna Schlee C.

## 2014-09-17 NOTE — H&P (View-Only) (Signed)
Richard Warner. [place] 09/07/2014 12:48 PM Location: [facility] Patient #: [id number] DOB: 08-31-1950 Single / Language: Richard Warner / Race: White Male History of Present Illness Richard Hector MD; 09/07/2014 1:13 PM) Patient words: chest lesion hx of rectal ca Elevated CEA.  The patient is a 64 year old male who presents with a soft tissue mass. Pleasant morbidly obese male fully anticoagulated for PE. History of rectal cancer status post low anterior resection 2013. Had pre-and post-operative chemotherapy. Completed chemotherapy January 2014. Follow-up colonoscopy in a year showed a few adenomas. No cancer. His CEA became elevated a few months ago. PET scan revealed mild signalling at the anastomosis. Endoscopy noted normal anastomosis. A few small polyps. Removed. This was late December. Patient incidentally noted mass on her right chest wall. Thought it was a ruptured cyst. However getting larger. More ulcerated. Not particularly painful. Some mild bleeding with epinephrine not severe. No major bleeding issues on his warfarin. Usually comes off his warfarin for procedures without problems. Other Problems Richard Mage [place], MA; 09/07/2014 12:48 PM) Colon Cancer High blood pressure Hypercholesterolemia Kidney Richard Warner  Past Surgical History Richard Mage [place], [place]; 09/07/2014 12:48 PM) Colon Polyp Removal - Colonoscopy  Diagnostic Studies History Richard Mage [place], [place]; 09/07/2014 12:48 PM) Colonoscopy within last year  Allergies Richard Mage [place], MA; 09/07/2014 12:50 PM) No Known Drug Allergies 09/07/2014  Medication History (Richard Spill[place], MA; 09/07/2014 12:51 PM) Atenolol (100MG  Tablet, Oral) Active. Doxazosin Mesylate (2MG  Tablet, Oral) Active. Zetia (10MG  Tablet, Oral) Active. Furosemide (20MG  Tablet, Oral) Active. Levothyroxine Sodium (75MCG Tablet, Oral) Active. Simvastatin (40MG  Tablet, Oral) Active. Warfarin Sodium (5MG  Tablet, Oral) Active. Baby  Aspirin (81MG  Tablet Chewable, Oral) Active. Vitamin D3 (50000UNIT Capsule, Oral) Active. Medications Reconciled  Social History Richard Mage [place], [place]; 09/07/2014 12:48 PM) Caffeine use Carbonated beverages. No alcohol use No drug use Tobacco use Never smoker.  Family History Richard Mage [place], [place]; 09/07/2014 12:48 PM) Alcohol Abuse Father. Hypertension Father, Mother.     Review of Systems Richard Mage [place]; 09/07/2014 12:48 PM) General Not Present- Appetite Loss, Chills, Fatigue, Fever, Night Sweats, Weight Gain and Weight Loss. Skin Present- New Lesions. Not Present- Change in Wart/Mole, Dryness, Hives, Jaundice, Non-Healing Wounds, Rash and Ulcer. HEENT Not Present- Earache, Hearing Loss, Hoarseness, Nose Bleed, Oral Ulcers, Ringing in the Ears, Seasonal Allergies, Sinus Pain, Sore Throat, Visual Disturbances, Wears glasses/contact lenses and Yellow Eyes. Respiratory Not Present- Bloody sputum, Chronic Cough, Difficulty Breathing, Snoring and Wheezing. Breast Not Present- Breast Mass, Breast Pain, Nipple Discharge and Skin Changes. Cardiovascular Present- Swelling of Extremities. Not Present- Chest Pain, Difficulty Breathing Lying Down, Leg Cramps, Palpitations, Rapid Heart Rate and Shortness of Breath. Gastrointestinal Not Present- Abdominal Pain, Bloating, Bloody Stool, Change in Bowel Habits, Chronic diarrhea, Constipation, Difficulty Swallowing, Excessive gas, Gets full quickly at meals, Hemorrhoids, Indigestion, Nausea, Rectal Pain and Vomiting. Male Genitourinary Not Present- Blood in Urine, Change in Urinary Stream, Frequency, Impotence, Nocturia, Painful Urination, Urgency and Urine Leakage.  Vitals (Richard Spillers MA; 09/07/2014 12:49 PM) 09/07/2014 12:49 PM Weight: 366 lb Height: 71in Body Surface Area: 2.88 m Body Mass Index: 51.05 kg/m Pulse: 66 (Regular)  BP: 144/72 (Sitting, Left Arm, Standard)     Physical Exam Richard Hector MD; 09/07/2014 1:13  PM)  General Mental Status-Alert. General Appearance-Not in acute distress, Not Sickly. Orientation-Oriented X3. Hydration-Well hydrated. Voice-Normal. Note: Morbidly obese with Apple body habitus. Moves around well.   Integumentary Global Assessment Upon inspection and palpation of skin surfaces of the - Axillae: non-tender, no inflammation or ulceration,  no drainage. and Distribution of scalp and body hair is normal. General Characteristics Temperature - normal warmth is noted. Note: 4x3x2cm raised pedunculated ulcerated mass on infraclavicular fossa anterior chest wall   Head and Neck Head-normocephalic, atraumatic with no lesions or palpable masses. Face Global Assessment - atraumatic, no absence of expression. Neck Global Assessment - no abnormal movements, no bruit auscultated on the right, no bruit auscultated on the left, no decreased range of motion, non-tender. Trachea-midline. Thyroid Gland Characteristics - non-tender.  Eye Eyeball - Left-Extraocular movements intact, No Nystagmus. Eyeball - Right-Extraocular movements intact, No Nystagmus. Cornea - Left-No Hazy. Cornea - Right-No Hazy. Sclera/Conjunctiva - Left-No scleral icterus, No Discharge. Sclera/Conjunctiva - Right-No scleral icterus, No Discharge. Pupil - Left-Direct reaction to light normal. Pupil - Right-Direct reaction to light normal.  ENMT Ears Pinna - Left - no drainage observed, no generalized tenderness observed. Right - no drainage observed, no generalized tenderness observed. Nose and Sinuses External Inspection of the Nose - no destructive lesion observed. Inspection of the nares - Left - quiet respiration. Right - quiet respiration. Mouth and Throat Lips - Upper Lip - no fissures observed, no pallor noted. Lower Lip - no fissures observed, no pallor noted. Nasopharynx - no discharge present. Oral Cavity/Oropharynx - Tongue - no dryness observed. Oral Mucosa -  no cyanosis observed. Hypopharynx - no evidence of airway distress observed.  Chest and Lung Exam Inspection Movements - Normal and Symmetrical. Accessory muscles - No use of accessory muscles in breathing. Palpation Palpation of the chest reveals - Non-tender. Auscultation Breath sounds - Normal and Clear.  Cardiovascular Auscultation Rhythm - Regular. Murmurs & Other Heart Sounds - Auscultation of the heart reveals - No Murmurs and No Systolic Clicks.  Abdomen Inspection Inspection of the abdomen reveals - No Visible peristalsis and No Abnormal pulsations. Umbilicus - No Bleeding, No Urine drainage. Palpation/Percussion Palpation and Percussion of the abdomen reveal - Soft, Non Tender, No Rebound tenderness, No Rigidity (guarding) and No Cutaneous hyperesthesia.  Male Genitourinary Sexual Maturity Tanner 5 - Adult hair pattern and Adult penile size and shape.  Rectal Note: Rectal examination deferred given colonoscopy 5 weeks ago.   Peripheral Vascular Upper Extremity Inspection - Left - No Cyanotic nailbeds, Not Ischemic. Right - No Cyanotic nailbeds, Not Ischemic.  Neurologic Neurologic evaluation reveals -normal attention span and ability to concentrate, able to name objects and repeat phrases. Appropriate fund of knowledge , normal sensation and normal coordination. Mental Status Affect - not angry, not paranoid. Cranial Nerves-Normal Bilaterally. Gait-Normal.  Neuropsychiatric Mental status exam performed with findings of-able to articulate well with normal speech/language, rate, volume and coherence, thought content normal with ability to perform basic computations and apply abstract reasoning and no evidence of hallucinations, delusions, obsessions or homicidal/suicidal ideation.  Musculoskeletal Global Assessment Spine, Ribs and Pelvis - no instability, subluxation or laxity. Right Upper Extremity - no instability, subluxation or  laxity.  Lymphatic Head & Neck  General Head & Neck Lymphatics: Bilateral - Description - No Localized lymphadenopathy. Axillary  General Axillary Region: Bilateral - Description - No Localized lymphadenopathy. Femoral & Inguinal  Generalized Femoral & Inguinal Lymphatics: Left - Description - No Localized lymphadenopathy. Right - Description - No Localized lymphadenopathy.    Assessment & Plan Richard Hector MD; 09/07/2014 1:11 PM)  CHEST WALL MASS (786.6  R22.2) Impression: Firm ulcerated mass suspicious for skin cancer. Metastasis from rectal cancer seems less likely but not unheard of. Either way, I think this warrants excision. Given its size  and his Foley anticoagulation, would best be do this in the operating room in an outpatient setting.  The partial diagnosis was discussed. Techniques Ris benefits alternatives discussed. Most likely to do primary closure. Increased risk of bleeding and hematoma formation discussed as well. He agrees.  Should be able to come off warfarin perioperatively as he is done many times before for endoscopies etc.  Current Plans Schedule for Surgery  The pathophysiology of skin & subcutaneous masses was discussed.  Natural history risks without surgery were discussed.  I recommended surgery to remove the mass.  I explained the technique of removal with use of local anesthesia & possible need for more aggressive sedation/anesthesia for patient comfort.    Risks such as bleeding, infection, wound breakdown, heart attack, death, and other risks were discussed.  I noted a good likelihood this will help address the problem.   Possibility that this will not correct all symptoms was explained. Possibility of regrowth/recurrence of the mass was discussed.  We will work to minimize complications. Questions were answered.  The patient expresses understanding & wishes to proceed with surgery.

## 2014-09-17 NOTE — Transfer of Care (Signed)
Immediate Anesthesia Transfer of Care Note  Patient: Richard Warner  Procedure(s) Performed: Procedure(s): REMOVAL OF ANTERIOR CHEST WALL SKIN MASS (N/A)  Patient Location: PACU  Anesthesia Type:General  Level of Consciousness: awake, alert , oriented and patient cooperative  Airway & Oxygen Therapy: Patient Spontanous Breathing and Patient connected to face mask oxygen  Post-op Assessment: Report given to RN, Post -op Vital signs reviewed and stable and Patient moving all extremities  Post vital signs: Reviewed and stable  Last Vitals:  Filed Vitals:   09/17/14 0639  BP: 155/67  Pulse: 65  Temp: 36.6 C  Resp: 16    Complications: No apparent anesthesia complications

## 2014-09-17 NOTE — Anesthesia Preprocedure Evaluation (Addendum)
Anesthesia Evaluation  Patient identified by MRN, date of birth, ID band Patient awake    Reviewed: Allergy & Precautions, H&P , NPO status , Patient's Chart, lab work & pertinent test results  Airway Mallampati: II  TM Distance: >3 FB Neck ROM: Full    Dental no notable dental hx.    Pulmonary shortness of breath, sleep apnea ,  breath sounds clear to auscultation  Pulmonary exam normal       Cardiovascular hypertension, Pt. on home beta blockers and Pt. on medications Rhythm:Regular Rate:Normal  Dr. Acie Fredrickson and Dr. Gustavus Bryant notes reviewed. H/O DVT and PE in 2011   Neuro/Psych negative neurological ROS  negative psych ROS   GI/Hepatic negative GI ROS, Neg liver ROS,   Endo/Other  Hypothyroidism Morbid obesity  Renal/GU Renal InsufficiencyRenal diseaseCr 1.61     Musculoskeletal  (+) Arthritis -,   Abdominal (+) + obese,   Peds  Hematology negative hematology ROS (+) anemia ,   Anesthesia Other Findings   Reproductive/Obstetrics negative OB ROS                            Anesthesia Physical  Anesthesia Plan  ASA: III  Anesthesia Plan: MAC   Post-op Pain Management:    Induction: Intravenous  Airway Management Planned:   Additional Equipment:   Intra-op Plan:   Post-operative Plan:   Informed Consent: I have reviewed the patients History and Physical, chart, labs and discussed the procedure including the risks, benefits and alternatives for the proposed anesthesia with the patient or authorized representative who has indicated his/her understanding and acceptance.   Dental advisory given  Plan Discussed with: CRNA  Anesthesia Plan Comments:         Anesthesia Quick Evaluation

## 2014-09-17 NOTE — Discharge Instructions (Signed)
GENERAL SURGERY: POST OP INSTRUCTIONS ° °1. DIET: Follow a light bland diet the first 24 hours after arrival home, such as soup, liquids, crackers, etc.  Be sure to include lots of fluids daily.  Avoid fast food or heavy meals as your are more likely to get nauseated.   °2. Take your usually prescribed home medications unless otherwise directed. °3. PAIN CONTROL: °a. Pain is best controlled by a usual combination of three different methods TOGETHER: °i. Ice/Heat °ii. Over the counter pain medication °iii. Prescription pain medication °b. Most patients will experience some swelling and bruising around the incisions.  Ice packs or heating pads (30-60 minutes up to 6 times a day) will help. Use ice for the first few days to help decrease swelling and bruising, then switch to heat to help relax tight/sore spots and speed recovery.  Some people prefer to use ice alone, heat alone, alternating between ice & heat.  Experiment to what works for you.  Swelling and bruising can take several weeks to resolve.   °c. It is helpful to take an over-the-counter pain medication regularly for the first few weeks.  Choose one of the following that works best for you: °i. Naproxen (Aleve, etc)  Two 220mg tabs twice a day °ii. Ibuprofen (Advil, etc) Three 200mg tabs four times a day (every meal & bedtime) °iii. Acetaminophen (Tylenol, etc) 500-650mg four times a day (every meal & bedtime) °d. A  prescription for pain medication (such as oxycodone, hydrocodone, etc) should be given to you upon discharge.  Take your pain medication as prescribed.  °i. If you are having problems/concerns with the prescription medicine (does not control pain, nausea, vomiting, rash, itching, etc), please call us [phone] to see if we need to switch you to a different pain medicine that will work better for you and/or control your side effect better. °ii. If you need a refill on your pain medication, please contact your pharmacy.  They will contact our  office to request authorization. Prescriptions will not be filled after 5 pm or on week-ends. °4. Avoid getting constipated.  Between the surgery and the pain medications, it is common to experience some constipation.  Increasing fluid intake and taking a fiber supplement (such as Metamucil, Citrucel, FiberCon, MiraLax, etc) 1-2 times a day regularly will usually help prevent this problem from occurring.  A mild laxative (prune juice, Milk of Magnesia, MiraLax, etc) should be taken according to package directions if there are no bowel movements after 48 hours.   °5. Wash / shower every day.  You may shower over the dressings as they are waterproof.  Continue to shower over incision(s) after the dressing is off. °6. Remove your waterproof bandages 5 days after surgery.  You may leave the incision open to air.  You may have skin tapes (Steri Strips) covering the incision(s).  Leave them on until one week, then remove.  You may replace a dressing/Band-Aid to cover the incision for comfort if you wish.  ° ° ° ° °7. ACTIVITIES as tolerated:   °a. You may resume regular (light) daily activities beginning the next day--such as daily self-care, walking, climbing stairs--gradually increasing activities as tolerated.  If you can walk 30 minutes without difficulty, it is safe to try more intense activity such as jogging, treadmill, bicycling, low-impact aerobics, swimming, etc. °b. Save the most intensive and strenuous activity for last such as sit-ups, heavy lifting, contact sports, etc  Refrain from any heavy lifting or straining until you   are off narcotics for pain control.   c. DO NOT PUSH THROUGH PAIN.  Let pain be your guide: If it hurts to do something, don't do it.  Pain is your body warning you to avoid that activity for another week until the pain goes down. d. You may drive when you are no longer taking prescription pain medication, you can comfortably wear a seatbelt, and you can safely maneuver your car and  apply brakes. e. Dennis Bast may have sexual intercourse when it is comfortable.  8. FOLLOW UP in our office a. Please call [facility] at [phone] to set up an appointment to see your surgeon in the office for a follow-up appointment approximately 2-3 weeks after your surgery. b. Make sure that you call for this appointment the day you arrive home to insure a convenient appointment time. 9. IF YOU HAVE DISABILITY OR FAMILY LEAVE FORMS, BRING THEM TO THE OFFICE FOR PROCESSING.  DO NOT GIVE THEM TO YOUR DOCTOR.   WHEN TO CALL us [phone]: 1. Poor pain control 2. Reactions / problems with new medications (rash/itching, nausea, etc)  3. Fever over 101.5 F (38.5 C) 4. Worsening swelling or bruising 5. Continued bleeding from incision. 6. Increased pain, redness, or drainage from the incision 7. Difficulty breathing / swallowing   The clinic staff is available to answer your questions during regular business hours (8:30am-5pm).  Please dont hesitate to call and ask to speak to one of our nurses for clinical concerns.   If you have a medical emergency, go to the nearest emergency room or call 911.  A surgeon from [facility] is always on call at the [facility], [address], [place], [place], [place]  [address] ? MAIN: [phone] ? TOLL FREE: [phone] ?  FAX [phone] [email or web address]  Managing Pain  Pain after surgery or related to activity is often due to strain/injury to muscle, tendon, nerves and/or incisions.  This pain is usually short-term and will improve in a few months.   Many people find it helpful to do the following things TOGETHER to help speed the process of healing and to get back to regular activity more quickly:  1. Avoid heavy physical activity a.  no lifting greater than 20 pounds b. Do not push through the pain.  Listen to your body and avoid positions and maneuvers than reproduce the  pain c. Walking is okay as tolerated, but go slowly and stop when getting sore.  d. Remember: If it hurts to do it, then dont do it! 2. Take Anti-inflammatory medication  a. Take with food/snack around the clock for 1-2 weeks i. This helps the muscle and nerve tissues become less irritable and calm down faster b. Choose ONE of the following over-the-counter medications: i. Naproxen 220mg  tabs (ex. Aleve) 1-2 pills twice a day  ii. Ibuprofen 200mg  tabs (ex. Advil, Motrin) 3-4 pills with every meal and just before bedtime iii. Acetaminophen 500mg  tabs (Tylenol) 1-2 pills with every meal and just before bedtime 3. Use a Heating pad or Ice/Cold Pack a. 4-6 times a day b. May use warm bath/hottub  or showers 4. Try Gentle Massage and/or Stretching  a. at the area of pain many times a day b. stop if you feel pain - do not overdo it  Try these steps together to help you body heal faster and avoid making things get worse.  Doing just one of these things may not be  enough.    If you are not getting better after two weeks or are noticing you are getting worse, contact our office for further advice; we may need to re-evaluate you & see what other things we can do to help.  Excision of Skin Lesions Excision of a skin lesion refers to the removal of a section of skin by making small cuts (incisions) in the skin. This is typically done to remove a cancerous growth (basal cell carcinoma, squamous cell carcinoma, or melanoma) or a noncancerous growth (cyst). It may be done to treat or prevent cancer or infection. It may also be done to improve cosmetic appearance (removal of mole, skin tag). LET YOUR CAREGIVER KNOW ABOUT:   Allergies to food or medicine.  Medicines taken, including vitamins, herbs, eyedrops, over-the-counter medicines, and creams.  Use of steroids (by mouth or creams).  Previous problems with anesthetics or numbing medicines.  History of bleeding problems or blood clots.  History  of any prostheses.  Previous surgery.  Other health problems, including diabetes and kidney problems.  Possibility of pregnancy, if this applies. RISKS AND COMPLICATIONS  Many complications can be managed. With appropriate treatment and rehabilitation, the following complications are very uncommon:  Bleeding.  Infection.  Scarring.  Recurrence of cyst or cancer.  Changes in skin sensation or appearance (discoloration, swelling).  Reaction to anesthesia.  Allergic reaction to surgical materials or ointments.  Damage to nerves, blood vessels, muscles, or other structures.  Continued pain. BEFORE THE PROCEDURE  It is important to follow your caregiver's instructions prior to your procedure to avoid complications. Steps before your procedure may include:  Physical exam, blood tests, other procedures, such as removing a small sample for examination under a microscope (biopsy).  Your caregiver may review the procedure, the anesthesia being used, and what to expect after the procedure with you. You may be asked to:  Stop taking certain medicines, such as blood thinners (including aspirin, clopidogrel, ibuprofen), for several days prior to your procedure.  Take certain medicines.  Stop smoking. It is a good idea to arrange for a ride home after surgery and to have someone to help you with activities during recovery. PROCEDURE  There are several excision techniques. The type of excision or surgical technique used will depend on your condition, the location of the lesion, and your overall health. After the lesion is sterilized and a local anesthetic is applied, the following may be performed: Complete surgical excision The area to be removed is marked with a pen. Using a small scalpel and scissors, the surgeon gently cuts around and under the lesion until it is completely removed. The lesion is placed in a special fluid and sent to the lab for examination. If necessary, bleeding  will be controlled with a device that delivers heat. The edges of the wound are stitched together and a dressing is applied. This procedure may be performed to treat a cancerous growth or noncancerous cyst or lesion. Surgeons commonly perform an elliptical excision, to minimize scarring.  AFTER THE PROCEDURE  How well you heal depends on many factors. Most patients heal quite well with proper techniques and self-care. Scarring will lessen over time. HOME CARE INSTRUCTIONS   Take medicines for pain as directed.  Keep the incision area clean, dry, and protected for at least 48 hours. Change dressings as directed.  For bleeding, apply gentle but firm pressure to the wound using a folded towel for 20 minutes. Call your caregiver if bleeding does not stop.  Avoid high-impact exercise and activities until the stitches are removed or the area heals.  Follow your caregiver's instructions to minimize scarring. Avoid sun exposure until the area has healed. Scarring should lessen over time.  Follow up with your caregiver as directed. Removal of stitches within 4 to 14 days may be necessary. Finding out the results of your test Not all test results are available during your visit. If your test results are not back during the visit, make an appointment with your caregiver to find out the results. Do not assume everything is normal if you have not heard from your caregiver or the medical facility. It is important for you to follow up on all of your test results. SEEK MEDICAL CARE IF:   You or your child has an oral temperature above 102 F (38.9 C).  You develop signs of infection (chills, feeling unwell).  You notice bleeding, pain, discharge, redness, or swelling at the incision site.  You notice skin irregularities or changes in sensation. MAKE SURE YOU:   Understand these instructions.  Will watch your condition.  Will get help right away if you are not doing well or get worse. FOR MORE  INFORMATION  American Academy of Family Physicians: www.AromatherapyParty.no American Academy of Dermatology: http://jones-macias.info/ Document Released: 10/17/2009 Document Revised: 10/15/2011 Document Reviewed: 10/17/2009 [facility] Patient Information 2015 [place], [place]. This information is not intended to replace advice given to you by your health care provider. Make sure you discuss any questions you have with your health care provider.

## 2014-09-21 ENCOUNTER — Telehealth: Payer: Self-pay | Admitting: Nurse Practitioner

## 2014-09-21 ENCOUNTER — Encounter (HOSPITAL_COMMUNITY): Payer: Self-pay | Admitting: Surgery

## 2014-09-21 ENCOUNTER — Other Ambulatory Visit: Payer: Medicare Other

## 2014-09-21 ENCOUNTER — Ambulatory Visit (HOSPITAL_BASED_OUTPATIENT_CLINIC_OR_DEPARTMENT_OTHER): Payer: Medicare Other | Admitting: Nurse Practitioner

## 2014-09-21 VITALS — BP 159/60 | HR 63 | Temp 98.3°F | Resp 20 | Ht 71.0 in | Wt 363.5 lb

## 2014-09-21 DIAGNOSIS — N289 Disorder of kidney and ureter, unspecified: Secondary | ICD-10-CM

## 2014-09-21 DIAGNOSIS — C20 Malignant neoplasm of rectum: Secondary | ICD-10-CM

## 2014-09-21 DIAGNOSIS — D696 Thrombocytopenia, unspecified: Secondary | ICD-10-CM

## 2014-09-21 DIAGNOSIS — C44529 Squamous cell carcinoma of skin of other part of trunk: Secondary | ICD-10-CM

## 2014-09-21 DIAGNOSIS — R3129 Other microscopic hematuria: Secondary | ICD-10-CM

## 2014-09-21 DIAGNOSIS — E039 Hypothyroidism, unspecified: Secondary | ICD-10-CM

## 2014-09-21 DIAGNOSIS — I82409 Acute embolism and thrombosis of unspecified deep veins of unspecified lower extremity: Secondary | ICD-10-CM

## 2014-09-21 NOTE — Progress Notes (Addendum)
  [facility] OFFICE PROGRESS NOTE   Diagnosis:  Rectal cancer  INTERVAL HISTORY:   Richard Warner returns as scheduled. He underwent excision of the right chest wall mass by Dr. Johney Maine on 09/17/2014. He overall is feeling well. He notes some "tightness" at the right chest wall with certain movements. He has had recent constipation. He attributes this to the surgery. He is taking a stool softener with good results. No nausea or vomiting.  Objective:  Vital signs in last 24 hours:  Blood pressure 159/60, pulse 63, temperature 98.3 F (36.8 C), temperature source Oral, resp. rate 20, height 5\' 11"  (1.803 m), weight 363 lb 8 oz (164.883 kg), SpO2 95 %.    Resp: Lungs clear bilaterally Cardio: Regular rate and rhythm GI: Abdomen soft and nontender. No hepatomegaly. Vascular: 1+ lower leg edema bilaterally with chronic venous stasis change.  Skin: Healing incision right upper chest wall. No surrounding erythema or drainage. Resolving surrounding ecchymosis.   Lab Results:  Lab Results  Component Value Date   WBC 5.2 09/13/2014   HGB 15.0 09/13/2014   HCT 44.0 09/13/2014   MCV 88.7 09/13/2014   PLT 128* 09/13/2014   NEUTROABS 2.8 09/08/2013    Imaging:  No results found.  Medications: I have reviewed the patient's current medications.  Assessment/Plan: 1. Rectal cancer, stage II (pT3 pN0) status post low anterior resection 12/14/2011. The tumor was noted to be at 10 cm on a rigid proctoscopy at the time of surgery. He completed radiation and Xeloda from 01/31/2012-03/11/2012. He began a first cycle of adjuvant Xeloda on 04/22/2012. He completed cycle 5 beginning 07/17/2012. He completed cycle 6 beginning 08/08/2012.  Surveillance colonoscopy 05/15/2013, status post removal of tubular adenomas. Next colonoscopy 3 years.  Elevated CEA 06/18/2014  PET scan 06/30/2014 with mild focal hypermetabolism at the anorectal junction favored to be physiologic in the  absence of associated CT abnormality; irregular bladder wall thickening.  Colonoscopy 07/27/2014 with removal of a sessile ascending colon polyp-tubular adenoma, no other significant findings 2. History of iron deficiency anemia likely secondary to rectal bleeding.  3. 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 He is maintained on Coumadin.  4. Renal insufficiency.  5. Hypertension. 6. Chronic mild thrombocytopenia 7. Hypothyroid. 8. Microscopic hematuria on a urinalysis 07/02/2014 9. Right anterior chest wall skin lesion/mass status post excision 09/20/2014. 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 Margins not involved. No evidence of angiolymphatic invasion or perineural invasion.   Disposition: [name] underwent excision of the chest wall skin lesion/mass. Pathology showed squamous cell carcinoma with negative margins.  We will follow-up on the CEA from today. If it remains elevated we will repeat in 3 weeks. Otherwise he will return for a follow-up visit and CEA in 3 months.  Patient seen with Dr. Benay Spice.    Richard Warner ANP/GNP-BC   09/21/2014  10:41 AM This was a shared visit with Richard Warner. The elevated CEA may be related to the skin cancer.  Julieanne Manson, MD

## 2014-09-21 NOTE — Telephone Encounter (Signed)
Gave avs & calendar for May. °

## 2014-09-22 ENCOUNTER — Telehealth: Payer: Self-pay | Admitting: Oncology

## 2014-09-22 ENCOUNTER — Telehealth: Payer: Self-pay | Admitting: *Deleted

## 2014-09-22 ENCOUNTER — Other Ambulatory Visit: Payer: Self-pay | Admitting: Nurse Practitioner

## 2014-09-22 ENCOUNTER — Telehealth: Payer: Self-pay | Admitting: Nurse Practitioner

## 2014-09-22 DIAGNOSIS — C20 Malignant neoplasm of rectum: Secondary | ICD-10-CM

## 2014-09-22 LAB — CEA: CEA: 10.2 ng/mL — ABNORMAL HIGH (ref 0.0–5.0)

## 2014-09-22 NOTE — Telephone Encounter (Signed)
-----   Message from Bevelyn Ngo, LPN sent at 03/23/5908  1:49 PM EST -----   ----- Message -----    From: Owens Shark, NP    Sent: 09/22/2014   1:11 PM      To: Ladell Pier, MD, Bevelyn Ngo, LPN  Please let him know the CEA was slightly higher. We will repeat in 3 weeks.

## 2014-09-22 NOTE — Telephone Encounter (Signed)
Pt advised CEA was higher and will be calling pt for scheduling in 3 weeks. Lisa sent request to scheduling earlier today.  Pt understands.

## 2014-09-22 NOTE — Telephone Encounter (Signed)
Close encounter 

## 2014-09-22 NOTE — Telephone Encounter (Signed)
S/w pt confirming labs added on per 02/17 POF..... Richard Warner

## 2014-10-12 ENCOUNTER — Ambulatory Visit: Payer: Medicare Other | Admitting: Nurse Practitioner

## 2014-10-12 ENCOUNTER — Other Ambulatory Visit (HOSPITAL_BASED_OUTPATIENT_CLINIC_OR_DEPARTMENT_OTHER): Payer: Medicare Other

## 2014-10-12 ENCOUNTER — Ambulatory Visit (INDEPENDENT_AMBULATORY_CARE_PROVIDER_SITE_OTHER): Payer: Medicare Other | Admitting: *Deleted

## 2014-10-12 ENCOUNTER — Other Ambulatory Visit: Payer: Medicare Other

## 2014-10-12 DIAGNOSIS — N289 Disorder of kidney and ureter, unspecified: Secondary | ICD-10-CM

## 2014-10-12 DIAGNOSIS — C20 Malignant neoplasm of rectum: Secondary | ICD-10-CM

## 2014-10-12 DIAGNOSIS — Z5181 Encounter for therapeutic drug level monitoring: Secondary | ICD-10-CM

## 2014-10-12 DIAGNOSIS — I2699 Other pulmonary embolism without acute cor pulmonale: Secondary | ICD-10-CM

## 2014-10-12 DIAGNOSIS — I82409 Acute embolism and thrombosis of unspecified deep veins of unspecified lower extremity: Secondary | ICD-10-CM

## 2014-10-12 LAB — POCT INR: INR: 2.6

## 2014-10-12 LAB — CEA: CEA: 12.9 ng/mL — ABNORMAL HIGH (ref 0.0–5.0)

## 2014-10-14 ENCOUNTER — Telehealth: Payer: Self-pay | Admitting: Nurse Practitioner

## 2014-10-14 ENCOUNTER — Other Ambulatory Visit: Payer: Self-pay | Admitting: Nurse Practitioner

## 2014-10-14 DIAGNOSIS — C20 Malignant neoplasm of rectum: Secondary | ICD-10-CM

## 2014-10-14 NOTE — Telephone Encounter (Signed)
I notified Richard Warner the CEA was higher. Dr. Benay Spice recommends a referral to urology for evaluation of the irregular bladder wall thickening noted on PET scan November 2015 and microscopic hematuria. Orders entered into EPIC and POF submitted.

## 2014-10-15 ENCOUNTER — Telehealth: Payer: Self-pay | Admitting: Oncology

## 2014-10-15 NOTE — Telephone Encounter (Signed)
Patient confirm appointment for referral 04/13 @ 9:15

## 2014-11-11 ENCOUNTER — Telehealth: Payer: Self-pay | Admitting: Oncology

## 2014-11-11 NOTE — Telephone Encounter (Signed)
Medical records faxed to [facility] [phone]

## 2014-11-17 ENCOUNTER — Other Ambulatory Visit: Payer: Self-pay | Admitting: Urology

## 2014-11-23 ENCOUNTER — Ambulatory Visit (INDEPENDENT_AMBULATORY_CARE_PROVIDER_SITE_OTHER): Payer: Medicare Other | Admitting: *Deleted

## 2014-11-23 DIAGNOSIS — I82409 Acute embolism and thrombosis of unspecified deep veins of unspecified lower extremity: Secondary | ICD-10-CM | POA: Diagnosis not present

## 2014-11-23 DIAGNOSIS — C20 Malignant neoplasm of rectum: Secondary | ICD-10-CM

## 2014-11-23 DIAGNOSIS — I2699 Other pulmonary embolism without acute cor pulmonale: Secondary | ICD-10-CM

## 2014-11-23 DIAGNOSIS — Z5181 Encounter for therapeutic drug level monitoring: Secondary | ICD-10-CM | POA: Diagnosis not present

## 2014-11-23 LAB — POCT INR: INR: 2.6

## 2014-11-24 ENCOUNTER — Other Ambulatory Visit (HOSPITAL_COMMUNITY): Payer: Self-pay | Admitting: *Deleted

## 2014-11-25 ENCOUNTER — Encounter (HOSPITAL_COMMUNITY)
Admission: RE | Admit: 2014-11-25 | Discharge: 2014-11-25 | Disposition: A | Discharge status: Home / Self Care | Payer: Medicare Other | Source: Ambulatory Visit | Attending: Urology | Admitting: Urology

## 2014-11-25 ENCOUNTER — Encounter (HOSPITAL_COMMUNITY): Payer: Self-pay

## 2014-11-25 DIAGNOSIS — Z01818 Encounter for other preprocedural examination: Secondary | ICD-10-CM | POA: Diagnosis present

## 2014-11-25 HISTORY — DX: Long term (current) use of anticoagulants: Z79.01

## 2014-11-25 HISTORY — DX: Secondary hyperparathyroidism of renal origin: N25.81

## 2014-11-25 LAB — BASIC METABOLIC PANEL
ANION GAP: 8 (ref 5–15)
BUN: 24 mg/dL — AB (ref 6–23)
CO2: 26 mmol/L (ref 19–32)
Calcium: 9.1 mg/dL (ref 8.4–10.5)
Chloride: 109 mmol/L (ref 96–112)
Creatinine, Ser: 1.74 mg/dL — ABNORMAL HIGH (ref 0.50–1.35)
GFR calc non Af Amer: 40 mL/min — ABNORMAL LOW (ref >90)
GFR, EST AFRICAN AMERICAN: 46 mL/min — AB (ref >90)
Glucose, Bld: 133 mg/dL — ABNORMAL HIGH (ref 70–99)
POTASSIUM: 5.3 mmol/L — AB (ref 3.5–5.1)
Sodium: 143 mmol/L (ref 135–145)

## 2014-11-25 LAB — PROTIME-INR
INR: 2.19 — AB (ref 0.00–1.49)
Prothrombin Time: 24.5 seconds — ABNORMAL HIGH (ref 11.6–15.2)

## 2014-11-25 LAB — CBC
HCT: 44.5 % (ref 39.0–52.0)
Hemoglobin: 15.1 g/dL (ref 13.0–17.0)
MCH: 30.4 pg (ref 26.0–34.0)
MCHC: 33.9 g/dL (ref 30.0–36.0)
MCV: 89.5 fL (ref 78.0–100.0)
Platelets: 123 10*3/uL — ABNORMAL LOW (ref 150–400)
RBC: 4.97 MIL/uL (ref 4.22–5.81)
RDW: 13.2 % (ref 11.5–15.5)
WBC: 4.7 10*3/uL (ref 4.0–10.5)

## 2014-11-25 LAB — APTT: APTT: 40 s — AB (ref 24–37)

## 2014-11-25 NOTE — Progress Notes (Signed)
States will stop coumadin and aspirin 5 days pre op.states edema legs unchanged-"actually is better"

## 2014-11-25 NOTE — Progress Notes (Signed)
   11/25/14 0923  OBSTRUCTIVE SLEEP APNEA  Have you ever been diagnosed with sleep apnea through a sleep study? No  Do you snore loudly (loud enough to be heard through closed doors)?  0  Do you often feel tired, fatigued, or sleepy during the daytime? 0  Has anyone observed you stop breathing during your sleep? 0  Do you have, or are you being treated for high blood pressure? 1  BMI more than 35 kg/m2? 1  Age over 65 years old? 1  Neck circumference greater than 40 cm/16 inches? 1  Gender: 1

## 2014-11-25 NOTE — Progress Notes (Signed)
lov dr patel 2/16 chart

## 2014-11-25 NOTE — Progress Notes (Signed)
Faxed cbc, bmp to dr Janice Norrie via epic

## 2014-11-25 NOTE — Patient Instructions (Addendum)
[facility] - Preparing for Surgery Before surgery, you can play an important role.  Because skin is not sterile, your skin needs to be as free of germs as possible.  You can reduce the number of germs on your skin by washing with CHG (chlorahexidine gluconate) soap before surgery.  CHG is an antiseptic cleaner which kills germs and bonds with the skin to continue killing germs even after washing. Please DO NOT use if you have an allergy to CHG or antibacterial soaps.  If your skin becomes reddened/irritated stop using the CHG and inform your nurse when you arrive at Short Stay. Do not shave (including legs and underarms) for at least 48 hours prior to the first CHG shower.  You may shave your face/neck. Please follow these instructions carefully:  1.  Shower with CHG Soap the night before surgery and the  morning of Surgery.  2.  If you choose to wash your hair, wash your hair first as usual with your  normal  shampoo.  3.  After you shampoo, rinse your hair and body thoroughly to remove the  shampoo.                           4.  Use CHG as you would any other liquid soap.  You can apply chg directly  to the skin and wash                       Gently with a scrungie or clean washcloth.  5.  Apply the CHG Soap to your body ONLY FROM THE NECK DOWN.   Do not use on face/ open                           Wound or open sores. Avoid contact with eyes, ears mouth and genitals (private parts).                       Wash face,  Genitals (private parts) with your normal soap.             6.  Wash thoroughly, paying special attention to the area where your surgery  will be performed.  7.  Thoroughly rinse your body with warm water from the neck down.  8.  DO NOT shower/wash with your normal soap after using and rinsing off  the CHG Soap.                9.  Pat yourself dry with a clean towel.            10.  Wear clean pajamas.            11.  Place clean sheets on your bed the night of your first shower and  do not  sleep with pets. Day of Surgery : Do not apply any lotions/deodorants the morning of surgery.  Please wear clean clothes to the hospital/surgery center.  FAILURE TO FOLLOW THESE INSTRUCTIONS MAY RESULT IN THE CANCELLATION OF YOUR SURGERY PATIENT SIGNATURE_________________________________  NURSE SIGNATURE__________________________________  ________________________________________________________________________      Your procedure is scheduled on:  12/03/14  FRIDAY  Report to [facility] at      [place] AM.   Call this number if you have problems the morning of surgery: [phone]  Do not eat food  Or drink :After Midnight. Thursday NIGHT   Take these medicines the morning of surgery with A SIP OF WATER: LEVOTHYROXINE   .  Contacts, dentures or partial plates, or metal hairpins  can not be worn to surgery. Your family will be responsible for glasses, dentures, hearing aides while you are in surgery  Leave suitcase in the car. After surgery it may be brought to your room.  For patients admitted to the hospital, checkout time is 11:00 AM day of  discharge.         [facility] IS NOT RESPONSIBLE FOR ANY VALUABLES  Patients discharged the day of surgery will not be allowed to drive home. IF going home the day of surgery, you must have a driver and someone to stay with you for the first 24 hours                      Name and phone number of your driver:         Mother-- Makael Stein

## 2014-11-25 NOTE — Progress Notes (Signed)
ekg 2/16 epic

## 2014-11-25 NOTE — Progress Notes (Signed)
States has two bottles of hibiclins at home and does not desire bottle today

## 2014-12-01 NOTE — Anesthesia Preprocedure Evaluation (Addendum)
Anesthesia Evaluation  Patient identified by MRN, date of birth, ID band Patient awake    Reviewed: Allergy & Precautions, NPO status , Patient's Chart, lab work & pertinent test results, reviewed documented beta blocker date and time   Airway Mallampati: III   Neck ROM: Limited    Dental  (+) Teeth Intact, Dental Advisory Given   Pulmonary  breath sounds clear to auscultation        Cardiovascular hypertension, Pt. on medications DVT (HX PE) Rhythm:Regular  ECHO 2013 EF 65%   Neuro/Psych    GI/Hepatic   Endo/Other  Hypothyroidism Morbid obesity  Renal/GU Renal InsufficiencyRenal diseaseCreat 1.7     Musculoskeletal   Abdominal (+) + obese,   Peds  Hematology 15/44   Anesthesia Other Findings Has had multiple GA in past, did not have soar throat afterwards  Reproductive/Obstetrics                           Anesthesia Physical Anesthesia Plan  ASA: III  Anesthesia Plan: General   Post-op Pain Management:    Induction: Intravenous  Airway Management Planned: Oral ETT and Video Laryngoscope Planned  Additional Equipment:   Intra-op Plan:   Post-operative Plan: Extubation in OR  Informed Consent: I have reviewed the patients History and Physical, chart, labs and discussed the procedure including the risks, benefits and alternatives for the proposed anesthesia with the patient or authorized representative who has indicated his/her understanding and acceptance.     Plan Discussed with:   Anesthesia Plan Comments: (Have Glide ready)       Anesthesia Quick Evaluation

## 2014-12-02 MED ORDER — DEXTROSE 5 % IV SOLN
3.0000 g | INTRAVENOUS | Status: AC
  Administered 2014-12-03: 3 g via INTRAVENOUS
  Filled 2014-12-02: qty 3000

## 2014-12-03 ENCOUNTER — Ambulatory Visit (HOSPITAL_COMMUNITY)
Admission: RE | Admit: 2014-12-03 | Discharge: 2014-12-03 | Disposition: A | Discharge status: Home / Self Care | Payer: Medicare Other | Source: Ambulatory Visit | Attending: Urology | Admitting: Urology

## 2014-12-03 ENCOUNTER — Ambulatory Visit (HOSPITAL_COMMUNITY): Payer: Medicare Other | Admitting: Anesthesiology

## 2014-12-03 ENCOUNTER — Encounter (HOSPITAL_COMMUNITY)
Admission: RE | Disposition: A | Discharge status: Home / Self Care | Payer: Self-pay | Source: Ambulatory Visit | Attending: Urology

## 2014-12-03 ENCOUNTER — Encounter (HOSPITAL_COMMUNITY): Payer: Self-pay | Admitting: *Deleted

## 2014-12-03 DIAGNOSIS — E039 Hypothyroidism, unspecified: Secondary | ICD-10-CM | POA: Insufficient documentation

## 2014-12-03 DIAGNOSIS — Z79899 Other long term (current) drug therapy: Secondary | ICD-10-CM | POA: Diagnosis not present

## 2014-12-03 DIAGNOSIS — Z6841 Body Mass Index (BMI) 40.0 and over, adult: Secondary | ICD-10-CM | POA: Insufficient documentation

## 2014-12-03 DIAGNOSIS — Z7982 Long term (current) use of aspirin: Secondary | ICD-10-CM | POA: Insufficient documentation

## 2014-12-03 DIAGNOSIS — N3289 Other specified disorders of bladder: Secondary | ICD-10-CM | POA: Diagnosis not present

## 2014-12-03 DIAGNOSIS — N329 Bladder disorder, unspecified: Secondary | ICD-10-CM | POA: Diagnosis present

## 2014-12-03 DIAGNOSIS — N289 Disorder of kidney and ureter, unspecified: Secondary | ICD-10-CM | POA: Insufficient documentation

## 2014-12-03 DIAGNOSIS — Z86711 Personal history of pulmonary embolism: Secondary | ICD-10-CM | POA: Diagnosis not present

## 2014-12-03 DIAGNOSIS — Z85048 Personal history of other malignant neoplasm of rectum, rectosigmoid junction, and anus: Secondary | ICD-10-CM | POA: Insufficient documentation

## 2014-12-03 DIAGNOSIS — I10 Essential (primary) hypertension: Secondary | ICD-10-CM | POA: Diagnosis not present

## 2014-12-03 HISTORY — PX: CYSTOSCOPY WITH BIOPSY: SHX5122

## 2014-12-03 LAB — PROTIME-INR
INR: 1.1 (ref 0.00–1.49)
PROTHROMBIN TIME: 14.3 s (ref 11.6–15.2)

## 2014-12-03 SURGERY — CYSTOSCOPY, WITH BIOPSY
Anesthesia: General

## 2014-12-03 MED ORDER — MEPERIDINE HCL 50 MG/ML IJ SOLN: 6.2500 mg | INTRAMUSCULAR | Status: DC | PRN

## 2014-12-03 MED ORDER — 0.9 % SODIUM CHLORIDE (POUR BTL) OPTIME
TOPICAL | Status: DC | PRN
  Administered 2014-12-03: 1000 mL

## 2014-12-03 MED ORDER — LACTATED RINGERS IV SOLN
INTRAVENOUS | Status: DC
  Administered 2014-12-03: 1000 mL via INTRAVENOUS

## 2014-12-03 MED ORDER — DEXAMETHASONE SODIUM PHOSPHATE 10 MG/ML IJ SOLN
INTRAMUSCULAR | Status: DC | PRN
  Administered 2014-12-03: 10 mg via INTRAVENOUS

## 2014-12-03 MED ORDER — PROMETHAZINE HCL 25 MG/ML IJ SOLN: 6.2500 mg | INTRAMUSCULAR | Status: DC | PRN

## 2014-12-03 MED ORDER — FENTANYL CITRATE (PF) 100 MCG/2ML IJ SOLN
INTRAMUSCULAR | Status: AC
Start: 2014-12-03 — End: 2014-12-03
  Filled 2014-12-03: qty 2

## 2014-12-03 MED ORDER — LIDOCAINE HCL (CARDIAC) 20 MG/ML IV SOLN
INTRAVENOUS | Status: AC
  Filled 2014-12-03: qty 5

## 2014-12-03 MED ORDER — FENTANYL CITRATE (PF) 100 MCG/2ML IJ SOLN
INTRAMUSCULAR | Status: DC | PRN
  Administered 2014-12-03: 100 ug via INTRAVENOUS

## 2014-12-03 MED ORDER — FENTANYL CITRATE (PF) 100 MCG/2ML IJ SOLN: 25.0000 ug | INTRAMUSCULAR | Status: DC | PRN

## 2014-12-03 MED ORDER — MIDAZOLAM HCL 5 MG/5ML IJ SOLN
INTRAMUSCULAR | Status: DC | PRN
  Administered 2014-12-03 (×2): 1 mg via INTRAVENOUS

## 2014-12-03 MED ORDER — SODIUM CHLORIDE 0.9 % IR SOLN
Status: DC | PRN
  Administered 2014-12-03: 3000 mL

## 2014-12-03 MED ORDER — SODIUM CHLORIDE 0.9 % IV SOLN
INTRAVENOUS | Status: DC | PRN
  Administered 2014-12-03 (×2): via INTRAVENOUS

## 2014-12-03 MED ORDER — EPHEDRINE SULFATE 50 MG/ML IJ SOLN
INTRAMUSCULAR | Status: AC
  Filled 2014-12-03: qty 1

## 2014-12-03 MED ORDER — MIDAZOLAM HCL 2 MG/2ML IJ SOLN
INTRAMUSCULAR | Status: AC
  Filled 2014-12-03: qty 2

## 2014-12-03 MED ORDER — PROPOFOL 10 MG/ML IV BOLUS
INTRAVENOUS | Status: AC
  Filled 2014-12-03: qty 20

## 2014-12-03 MED ORDER — STERILE WATER FOR IRRIGATION IR SOLN
Status: DC | PRN
  Administered 2014-12-03: 3000 mL

## 2014-12-03 MED ORDER — PROPOFOL 10 MG/ML IV BOLUS
INTRAVENOUS | Status: DC | PRN
  Administered 2014-12-03: 200 mg via INTRAVENOUS
  Administered 2014-12-03: 50 mg via INTRAVENOUS

## 2014-12-03 MED ORDER — LIDOCAINE HCL (CARDIAC) 20 MG/ML IV SOLN
INTRAVENOUS | Status: DC | PRN
  Administered 2014-12-03: 100 mg via INTRAVENOUS

## 2014-12-03 MED ORDER — ONDANSETRON HCL 4 MG/2ML IJ SOLN
INTRAMUSCULAR | Status: AC
  Filled 2014-12-03: qty 2

## 2014-12-03 MED ORDER — SUCCINYLCHOLINE CHLORIDE 20 MG/ML IJ SOLN
INTRAMUSCULAR | Status: DC | PRN
  Administered 2014-12-03: 150 mg via INTRAVENOUS

## 2014-12-03 MED ORDER — SODIUM CHLORIDE 0.9 % IJ SOLN
INTRAMUSCULAR | Status: AC
  Filled 2014-12-03: qty 10

## 2014-12-03 SURGICAL SUPPLY — 16 items
BAG URINE DRAINAGE (UROLOGICAL SUPPLIES) IMPLANT
BAG URO CATCHER STRL LF (DRAPE) ×3 IMPLANT
DRAPE CAMERA CLOSED 9X96 (DRAPES) ×3 IMPLANT
ELECT REM PT RETURN 9FT ADLT (ELECTROSURGICAL) ×3
ELECTRODE REM PT RTRN 9FT ADLT (ELECTROSURGICAL) ×1 IMPLANT
GLOVE BIOGEL M 7.0 STRL (GLOVE) ×3 IMPLANT
GOWN STRL REUS W/TWL LRG LVL3 (GOWN DISPOSABLE) ×6 IMPLANT
JUMPSUIT BLUE BOOT COVER DISP (PROTECTIVE WEAR) ×3 IMPLANT
KIT ASPIRATION TUBING (SET/KITS/TRAYS/PACK) IMPLANT
LOOP CUT BIPOLAR 24F LRG (ELECTROSURGICAL) ×2 IMPLANT
MANIFOLD NEPTUNE II (INSTRUMENTS) ×3 IMPLANT
MARKER SKIN DUAL TIP RULER LAB (MISCELLANEOUS) ×3 IMPLANT
NS IRRIG 1000ML POUR BTL (IV SOLUTION) ×3 IMPLANT
PACK CYSTO (CUSTOM PROCEDURE TRAY) ×3 IMPLANT
TUBING CONNECTING 10 (TUBING) ×2 IMPLANT
TUBING CONNECTING 10' (TUBING) ×1

## 2014-12-03 NOTE — Op Note (Signed)
LEILAND MIHELICH is a 64 y.o.   12/03/2014  General  Preop diagnosis: Bladder mass.  Postop diagnosis: Same  Procedure done: Cystoscopy bladder biopsy  Surgeon: Charlene Brooke. Cambria Osten  Anesthesia: Gen.  Indication:  Patient is a 64 years old male who has a history of rectal cancer diagnosed in May 2013. PET scan in November 2015 revealed irregular wall thickening of the bladder. Cystoscopy showed some edema of the left anterior wall of the bladder. There is no evidence of papillary tumor in the bladder. Patient is scheduled today for cystoscopy bladder biopsy.  Procedure: Patient was identified by his wrist band and proper timeout was taken.  Under general anesthesia he was prepped and draped and placed in the dorsolithotomy position. He hasn't meatal stenosis eyebrow was able to pass a cystoscope through the meatus. A panendoscope was inserted in the bladder. The urethra is normal. He has moderate prostatic hypertrophy. I was not able to identify the area of edema of the anterior wall of the bladder with a 30 lens. With a 70 lens I was able to visualize the edematous area. That area is reddened. There is no evidence of papillary tumor in the bladder. The ureteral orifices are in normal position and shape. The cystoscope was removed. The anterior urethra was dilated with Von Buren sounds up to #30 [place]. I then passed a #26 [place] resectoscope in the bladder. I was again unable to visualize the edematous area of the bladder. I attempted different maneuvers to visualize that area but was unable to. I removed the resectoscope and passed the flexible cystoscope in the bladder. With the flexible cystoscope I was able to identify that area but I was not able to grasp tissues with the forceps for biopsy. I removed and the flexible cystoscope in passed a panendoscope in the bladder and with a 70 lens I was able to biopsy the suspicious area of the bladder. The specimen was sent to pathology. I then  reinserted the cystoscope in the bladder and there was no evidence of bleeding. The bladder was then emptied and the cystoscope removed.  The patient tolerated the procedure well and left the OR in satisfactory condition to postanesthesia care unit.  EBL: None  CC: Lenise Arena, M.D.

## 2014-12-03 NOTE — Discharge Instructions (Signed)
Cystoscopy, Care After   Refer to this sheet in the next few weeks. These instructions provide you with information on caring for yourself after your procedure. Your caregiver may also give you more specific instructions. Your treatment has been planned according to current medical practices, but problems sometimes occur. Call your caregiver if you have any problems or questions after your procedure.   HOME CARE INSTRUCTIONS   Things you can do to ease any discomfort after your procedure include:   Drinking enough water and fluids to keep your urine clear or pale yellow.   Taking a warm bath to relieve any burning feelings.  SEEK IMMEDIATE MEDICAL CARE IF:   You have an increase in blood in your urine.   You notice blood clots in your urine.   You have difficulty passing urine.   You have the chills.   You have abdominal pain.   You have a fever or persistent symptoms for more than 2-3 days.   You have a fever and your symptoms suddenly get worse.  MAKE SURE YOU:   Understand these instructions.   Will watch your condition.   Will get help right away if you are not doing well or get worse.  Document Released: 02/09/2005 Document Revised: 03/25/2013 Document Reviewed: 01/14/2012   ExitCare® Patient Information ©2015 [employer]. This information is not intended to replace advice given to you by your health care provider. Make sure you discuss any questions you have with your health care provider.

## 2014-12-03 NOTE — H&P (Signed)
History of Present Illness [name] is referred by Dr Richard Warner. He has a history of rectal cancer diagnosed in May 2013. His CEA has recently been going up. PET scan in November revealed irregular wall thickening of the bladder. He is sent to our office for further evaluation. He states that he does not have any voiding symptoms. He does not have dysuria, straining on urination, hematuria.   Past Medical History Problems  1. History of Colorectal cancer (C19) 2. History of malignant melanoma (Z85.820) 3. History of pulmonary embolism (Z86.711)  Surgical History Problems  1. History of Colon Surgery 2. History of Skin Debridement 3. History of Tonsillectomy  Current Meds 1. Aspirin 81 MG Oral Tablet;  Therapy: (Recorded:13Apr2016) to Recorded 2. Atenolol 100 MG Oral Tablet;  Therapy: (Recorded:13Apr2016) to Recorded 3. Doxazosin Mesylate 2 MG Oral Tablet;  Therapy: (Recorded:13Apr2016) to Recorded 4. Levothyroxine Sodium 75 MCG Oral Tablet;  Therapy: (Recorded:13Apr2016) to Recorded 5. Simvastatin 40 MG Oral Tablet;  Therapy: (Recorded:13Apr2016) to Recorded 6. Vitamin D TABS;  Therapy: (Recorded:13Apr2016) to Recorded 7. Zetia 10 MG Oral Tablet;  Therapy: (Recorded:13Apr2016) to Recorded  Allergies Medication  1. No Known Drug Allergies  Family History Problems  1. Family history of diabetes mellitus (Z83.3) : Father  Social History Problems    Denied: History of Alcohol use   Caffeine use (F15.90)   Divorced   Father deceased   Mother alive and healthy   Never a smoker   Number of children   Retired  Review of Systems Genitourinary, constitutional, skin, eye, otolaryngeal, hematologic/lymphatic, cardiovascular, pulmonary, endocrine, musculoskeletal, gastrointestinal, neurological and psychiatric system(s) were reviewed and pertinent findings if present are noted and are otherwise negative.  Cardiovascular: leg swelling.    Vitals Vital Signs  [Data Includes: Last 1 Day]  Recorded: 13Apr2016 09:31AM  Height: 5 ft 11 in Weight: 370 lb  BMI Calculated: 51.6 BSA Calculated: 2.74 Blood Pressure: 162 / 69 Heart Rate: 62 Respiration: 18  Physical Exam Constitutional: Well nourished and well developed . No acute distress.  ENT:. The ears and nose are normal in appearance.  Neck: The appearance of the neck is normal and no neck mass is present.  Pulmonary: No respiratory distress and normal respiratory rhythm and effort.  Cardiovascular: Heart rate and rhythm are normal . No peripheral edema.  Abdomen: The abdomen is soft and nontender. No masses are palpated. No CVA tenderness. No hernias are palpable. No hepatosplenomegaly noted.  Rectal: Rectal exam demonstrates normal sphincter tone, no tenderness and no masses. Estimated prostate size is 2+. The prostate has no nodularity and is not tender. The left seminal vesicle is nonpalpable. The right seminal vesicle is nonpalpable. The perineum is normal on inspection.  Genitourinary: Examination of the penis demonstrates no discharge, no masses, no lesions and a normal meatus. The scrotum is without lesions. The right epididymis is palpably normal and non-tender. The left epididymis is palpably normal and non-tender. The right testis is non-tender and without masses. The left testis is non-tender and without masses.  Lymphatics: The femoral and inguinal nodes are not enlarged or tender.  Skin: Normal skin turgor, no visible rash and no visible skin lesions.  Neuro/Psych:. Mood and affect are appropriate.    Results/Data Urine [Data Includes: Last 1 Day]   13Apr2016  COLOR YELLOW   APPEARANCE CLEAR   SPECIFIC GRAVITY 1.020   pH 5.5   GLUCOSE NEG mg/dL  BILIRUBIN NEG   KETONE NEG mg/dL  BLOOD MOD   PROTEIN NEG  mg/dL  UROBILINOGEN 0.2 mg/dL  NITRITE NEG   LEUKOCYTE ESTERASE NEG   SQUAMOUS EPITHELIAL/HPF RARE   WBC NONE SEEN WBC/hpf  RBC 0-2 RBC/hpf  BACTERIA NONE SEEN   CRYSTALS  NONE SEEN   CASTS NONE SEEN    Procedure  Procedure: Cystoscopy   Informed Consent: Risks, benefits, and potential adverse events were discussed and informed consent was obtained from the patient . Specific risks including, but not limited to bleeding, infection, pain, allergic reaction etc. were explained.  Prep: The patient was prepped with betadine.  Anesthesia:. Local anesthesia was administered intraurethrally with 2% lidocaine jelly.  Antibiotic prophylaxis: Ciprofloxacin.  Procedure Note:  Urethral meatus:. No abnormalities.  Anterior urethra: No abnormalities.  Prostatic urethra:. The lateral prostatic lobes were enlarged.  Bladder: Visulization was clear. Examination of the bladder demonstrated edema located on the anterior aspect, on the left side of the bladder . There is edema of the left anterior bladder wall. There is no papillary tumor in the bladder.    Assessment Assessed  1. 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 UA With REFLEX; [Do Not Release]; Status:Complete;   Done: [id number] 09:15AM  He needs bladder biopsy. The procedure, risks, benefits were explained to the patient. The risks include but are not limited to hemorrhage, infection, bladder injury. He understands and wishes to proceed.

## 2014-12-03 NOTE — Anesthesia Postprocedure Evaluation (Signed)
  Anesthesia Post-op Note  Patient: Richard Warner  Procedure(s) Performed: Procedure(s): CYSTOSCOPY WITH BIOPSY (N/A)  Patient Location: PACU  Anesthesia Type:General  Level of Consciousness: awake and alert   Airway and Oxygen Therapy: Patient Spontanous Breathing  Post-op Pain: mild  Post-op Assessment: Post-op Vital signs reviewed  Post-op Vital Signs: Reviewed and stable  Last Vitals:  Filed Vitals:   12/03/14 1330  BP: 129/66  Pulse: 69  Temp:   Resp: 18    Complications: No apparent anesthesia complications

## 2014-12-03 NOTE — Transfer of Care (Signed)
Immediate Anesthesia Transfer of Care Note  Patient: Richard Warner  Procedure(s) Performed: Procedure(s): CYSTOSCOPY WITH BIOPSY (N/A)  Patient Location: PACU  Anesthesia Type:General  Level of Consciousness: awake, alert , oriented and patient cooperative  Airway & Oxygen Therapy: Patient Spontanous Breathing and Patient connected to face mask oxygen  Post-op Assessment: Report given to RN, Post -op Vital signs reviewed and stable and Patient moving all extremities  Post vital signs: Reviewed and stable  Last Vitals:  Filed Vitals:   12/03/14 0902  BP: 139/56  Pulse: 61  Temp: 36.6 C  Resp: 16    Complications: No apparent anesthesia complications

## 2014-12-03 NOTE — Progress Notes (Addendum)
Patient to resume Coumadin Monday (12/06/14) and Aspirin 81 mg tomorrow (12/04/14) per Dr. Janice Norrie.

## 2014-12-06 ENCOUNTER — Encounter (HOSPITAL_COMMUNITY): Payer: Self-pay | Admitting: Urology

## 2014-12-07 ENCOUNTER — Telehealth: Payer: Self-pay | Admitting: *Deleted

## 2014-12-07 NOTE — Telephone Encounter (Addendum)
Message from [place] with Dr. Ernie Hew reporting pt is having hematuria. Resumed Warfarin 4 days after bladder biopsy. Should pt continue Warfarin? It appears Dr. Melvyn Novas and [facility] are managing pt's Coumadin.  Requested Lisa contact Dr. Melvyn Novas for anticoagulant instructions.

## 2014-12-17 ENCOUNTER — Ambulatory Visit (INDEPENDENT_AMBULATORY_CARE_PROVIDER_SITE_OTHER): Payer: Medicare Other | Admitting: *Deleted

## 2014-12-17 DIAGNOSIS — Z5181 Encounter for therapeutic drug level monitoring: Secondary | ICD-10-CM

## 2014-12-17 DIAGNOSIS — I82409 Acute embolism and thrombosis of unspecified deep veins of unspecified lower extremity: Secondary | ICD-10-CM | POA: Diagnosis not present

## 2014-12-17 DIAGNOSIS — I2699 Other pulmonary embolism without acute cor pulmonale: Secondary | ICD-10-CM | POA: Diagnosis not present

## 2014-12-17 DIAGNOSIS — C20 Malignant neoplasm of rectum: Secondary | ICD-10-CM | POA: Diagnosis not present

## 2014-12-17 LAB — POCT INR: INR: 1.2

## 2014-12-21 ENCOUNTER — Ambulatory Visit (HOSPITAL_BASED_OUTPATIENT_CLINIC_OR_DEPARTMENT_OTHER): Payer: Medicare Other | Admitting: Oncology

## 2014-12-21 ENCOUNTER — Other Ambulatory Visit: Payer: Self-pay | Admitting: *Deleted

## 2014-12-21 ENCOUNTER — Telehealth: Payer: Self-pay | Admitting: Oncology

## 2014-12-21 ENCOUNTER — Other Ambulatory Visit (HOSPITAL_BASED_OUTPATIENT_CLINIC_OR_DEPARTMENT_OTHER): Payer: Medicare Other

## 2014-12-21 VITALS — BP 162/67 | HR 62 | Temp 98.2°F | Resp 19 | Ht 71.0 in | Wt 368.9 lb

## 2014-12-21 DIAGNOSIS — Z86718 Personal history of other venous thrombosis and embolism: Secondary | ICD-10-CM

## 2014-12-21 DIAGNOSIS — D696 Thrombocytopenia, unspecified: Secondary | ICD-10-CM

## 2014-12-21 DIAGNOSIS — C20 Malignant neoplasm of rectum: Secondary | ICD-10-CM

## 2014-12-21 DIAGNOSIS — D500 Iron deficiency anemia secondary to blood loss (chronic): Secondary | ICD-10-CM | POA: Diagnosis not present

## 2014-12-21 DIAGNOSIS — Z86711 Personal history of pulmonary embolism: Secondary | ICD-10-CM

## 2014-12-21 DIAGNOSIS — N289 Disorder of kidney and ureter, unspecified: Secondary | ICD-10-CM | POA: Diagnosis not present

## 2014-12-21 DIAGNOSIS — E039 Hypothyroidism, unspecified: Secondary | ICD-10-CM

## 2014-12-21 DIAGNOSIS — C44529 Squamous cell carcinoma of skin of other part of trunk: Secondary | ICD-10-CM | POA: Diagnosis not present

## 2014-12-21 DIAGNOSIS — I10 Essential (primary) hypertension: Secondary | ICD-10-CM

## 2014-12-21 DIAGNOSIS — Z85048 Personal history of other malignant neoplasm of rectum, rectosigmoid junction, and anus: Secondary | ICD-10-CM

## 2014-12-21 DIAGNOSIS — E669 Obesity, unspecified: Secondary | ICD-10-CM

## 2014-12-21 NOTE — Telephone Encounter (Signed)
Gave adn printed appt sched and avs for pt for Aug °

## 2014-12-21 NOTE — Progress Notes (Signed)
[place] OFFICE PROGRESS NOTE   Diagnosis: Rectal cancer  INTERVAL HISTORY:   Richard Warner returns as scheduled. He saw Dr. Janice Norrie and was noted to have edema of the left anterior wall of the bladder on a cystoscopy. He was taken to a cystoscopy and bladder biopsy on 12/03/2014. The reddened edematous area was biopsied. The pathology revealed no atypia or malignancy. Denuded urothelium with stromal hemorrhage and fibrosis was noted.  He was taken off of Coumadin for the biopsy procedure. He would like to consider discontinuing Coumadin as he feels this is interfering with his weight loss diet.  Richard Warner reports intermittent discomfort at the right lower back for the past 3-4 months. He reports no discomfort since the cystoscopy procedure.  Objective:  Vital signs in last 24 hours:  Blood pressure 162/67, pulse 62, temperature 98.2 F (36.8 C), temperature source Oral, resp. rate 19, height 5\' 11"  (1.803 m), weight 368 lb 14.4 oz (167.332 kg), SpO2 97 %.    HEENT: Neck without mass Lymphatics: No cervical, supraclavicular, left axillary, or inguinal nodes. Soft 1-1.5 cm lymph node versus prominent fat tissue at the medial right axilla under the pectoralis Resp: Lungs clear bilaterally Cardio: Rate and rhythm GI: No hepatosplenomegaly Vascular: Chronic stasis change at the lower leg bilaterally  Skin: Right upper chest scar without evidence of recurrent tumor     Lab Results:   Lab Results  Component Value Date   CEA 12.9* 10/12/2014    Imaging:  No results found.  Medications: I have reviewed the patient's current medications.  Assessment/Plan: 1. Rectal cancer, stage II (pT3 pN0) status post low anterior resection 12/14/2011. The tumor was noted to be at 10 cm on a rigid proctoscopy at the time of surgery. He completed radiation and Xeloda from 01/31/2012-03/11/2012. He began a first cycle of adjuvant Xeloda on 04/22/2012. He completed cycle 5  beginning 07/17/2012. He completed cycle 6 beginning 08/08/2012.  Surveillance colonoscopy 05/15/2013, status post removal of tubular adenomas. Next colonoscopy 3 years.  Elevated CEA 06/18/2014  PET scan 06/30/2014 with mild focal hypermetabolism at the anorectal junction favored to be physiologic in the absence of associated CT abnormality; irregular bladder wall thickening.  Colonoscopy 07/27/2014 with removal of a sessile ascending colon polyp-tubular adenoma, no other significant findings 2. History of iron deficiency anemia likely secondary to rectal bleeding.  3. 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 He is maintained on Coumadin.  4. Renal insufficiency.  5. Hypertension. 6. Chronic mild thrombocytopenia 7. Hypothyroid. 8. Microscopic hematuria on a urinalysis 07/02/2014  Evaluated by Dr. Janice Norrie including a cystoscopic biopsy of an erythematous/edematous area of the bladder on 12/03/2014-negative for malignancy 9. Right anterior chest wall skin lesion/mass status post excision 09/20/2014. 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 Margins not involved. No evidence of angiolymphatic invasion or perineural invasion.   Disposition:  Richard Warner appears well. The urology evaluation is negative for malignancy to date. The CEA remained elevated after the chest wall squamous cell carcinoma was removed in February. We repeated a CEA today. We will consider additional imaging if the CEA remains elevated.  I recommend he continue anticoagulation therapy secondary to his history of a pulmonary embolism. He has obesity and may have a malignancy. He will continue management of the Coumadin anticoagulation via the [place] anticoagulation clinic. He could consider switching to xarelto if recommended by Dr. Melvyn Novas and Dr. Ernie Hew.  Richard Warner will be scheduled for an office visit and CEA in 3  months.  Betsy Coder, MD  12/21/2014    11:06 AM

## 2014-12-22 ENCOUNTER — Telehealth: Payer: Self-pay | Admitting: *Deleted

## 2014-12-22 LAB — CEA: CEA: 24.3 ng/mL — AB (ref 0.0–5.0)

## 2014-12-22 NOTE — Telephone Encounter (Signed)
-----   Message from Ladell Pier, MD sent at 12/22/2014  4:15 PM EDT ----- Please call patient, cea is still elevated, I will present case at conference 5/25 and contact him with plan for repeat CEA or additional xrays Copy result to Dr. Janice Norrie

## 2014-12-23 NOTE — Telephone Encounter (Signed)
Received call back from pt this am. Returned his call and shared information from Dr. Benay Spice.  Pt. Voiced understanding. No other needs identified.

## 2014-12-24 ENCOUNTER — Telehealth: Payer: Self-pay | Admitting: Physician Assistant

## 2014-12-24 ENCOUNTER — Ambulatory Visit (INDEPENDENT_AMBULATORY_CARE_PROVIDER_SITE_OTHER): Payer: Medicare Other | Admitting: *Deleted

## 2014-12-24 ENCOUNTER — Other Ambulatory Visit: Payer: Self-pay | Admitting: *Deleted

## 2014-12-24 ENCOUNTER — Telehealth: Payer: Self-pay | Admitting: *Deleted

## 2014-12-24 DIAGNOSIS — I82409 Acute embolism and thrombosis of unspecified deep veins of unspecified lower extremity: Secondary | ICD-10-CM | POA: Diagnosis not present

## 2014-12-24 DIAGNOSIS — C20 Malignant neoplasm of rectum: Secondary | ICD-10-CM

## 2014-12-24 DIAGNOSIS — Z5181 Encounter for therapeutic drug level monitoring: Secondary | ICD-10-CM

## 2014-12-24 DIAGNOSIS — I2699 Other pulmonary embolism without acute cor pulmonale: Secondary | ICD-10-CM | POA: Diagnosis not present

## 2014-12-24 LAB — POCT INR: INR: 2.2

## 2014-12-24 NOTE — Telephone Encounter (Signed)
Received records from [facility] forwarded to [place] 12/24/14 fbg.

## 2014-12-24 NOTE — Telephone Encounter (Signed)
Please scheduled for GI tumor conference 12/29/2014 or 01/05/2015. Radiology only-review PET scan Thanks

## 2014-12-24 NOTE — Telephone Encounter (Signed)
Called pt, Dr. Benay Spice recommends PET scan. Spoke with Richard Warner in radiology, he will have schedulers contact pt with an appointment for PET prior to 5/25.

## 2014-12-27 ENCOUNTER — Encounter (HOSPITAL_COMMUNITY)
Admission: RE | Admit: 2014-12-27 | Discharge: 2014-12-27 | Disposition: A | Discharge status: Home / Self Care | Payer: Medicare Other | Source: Ambulatory Visit | Attending: Oncology | Admitting: Oncology

## 2014-12-27 DIAGNOSIS — K8020 Calculus of gallbladder without cholecystitis without obstruction: Secondary | ICD-10-CM | POA: Insufficient documentation

## 2014-12-27 DIAGNOSIS — I517 Cardiomegaly: Secondary | ICD-10-CM | POA: Diagnosis not present

## 2014-12-27 DIAGNOSIS — C20 Malignant neoplasm of rectum: Secondary | ICD-10-CM | POA: Diagnosis present

## 2014-12-27 DIAGNOSIS — I2510 Atherosclerotic heart disease of native coronary artery without angina pectoris: Secondary | ICD-10-CM | POA: Insufficient documentation

## 2014-12-27 DIAGNOSIS — K4090 Unilateral inguinal hernia, without obstruction or gangrene, not specified as recurrent: Secondary | ICD-10-CM | POA: Insufficient documentation

## 2014-12-27 DIAGNOSIS — K760 Fatty (change of) liver, not elsewhere classified: Secondary | ICD-10-CM | POA: Insufficient documentation

## 2014-12-27 LAB — GLUCOSE, CAPILLARY: Glucose-Capillary: 122 mg/dL — ABNORMAL HIGH (ref 65–99)

## 2014-12-27 MED ORDER — FLUDEOXYGLUCOSE F - 18 (FDG) INJECTION
15.9800 | Freq: Once | INTRAVENOUS | Status: AC | PRN
  Administered 2014-12-27: 15.98 via INTRAVENOUS

## 2014-12-27 MED ORDER — FLUDEOXYGLUCOSE F - 18 (FDG) INJECTION: 15.9000 | Freq: Once | INTRAVENOUS | Status: AC | PRN

## 2014-12-28 ENCOUNTER — Telehealth: Payer: Self-pay | Admitting: *Deleted

## 2014-12-28 NOTE — Telephone Encounter (Signed)
-----   Message from Tania Ade, RN sent at 12/28/2014  8:16 AM EDT ----- Regarding: Call results Tareq Dwan, I already put him on for GI conference. Could you call the results to him per Dr. Gearldine Shown note. Thanks ----- Message -----    From: Ladell Pier, MD    Sent: 12/27/2014   6:36 PM      To: Tania Ade, RN  Please put him on for conference 6/1 Please tell him PET shows no clear evidence of cancer, we will present at conference and call him with reocmmendations ----- Message -----    From: Rad Results In Interface    Sent: 12/27/2014  12:54 PM      To: Ladell Pier, MD

## 2014-12-28 NOTE — Telephone Encounter (Signed)
Per Dr. Benay Spice; notified pt that PET shows no clear evidence of cancer and case will be presented at GI conference and office will call him with recommendations.  Pt verbalized understanding of information and expressed appreciation for call back.

## 2014-12-29 ENCOUNTER — Telehealth: Payer: Self-pay

## 2014-12-29 NOTE — Telephone Encounter (Signed)
-----   Message from [name], MD sent at 12/29/2014 11:21 AM EDT ----- Chong Sicilian, He needs lower EUS, radial +/- linear, next available EUS Thursday, ++MAC, OK to stay on coumadin for it.  Dx, possible recurrent rectal cancer.  Thanks    ----- Message -----    From: [name], MD    Sent: 12/29/2014   8:08 AM      To: [name], MD  Lower EUS  Recurrent rectal cancer

## 2014-12-30 ENCOUNTER — Encounter (HOSPITAL_COMMUNITY): Payer: Self-pay | Admitting: *Deleted

## 2014-12-30 ENCOUNTER — Other Ambulatory Visit: Payer: Self-pay

## 2014-12-30 DIAGNOSIS — C20 Malignant neoplasm of rectum: Secondary | ICD-10-CM

## 2014-12-30 NOTE — Telephone Encounter (Signed)
EUS scheduled, pt instructed and medications reviewed.  Patient instructions mailed to home.  Patient to call with any questions or concerns.  

## 2015-01-05 ENCOUNTER — Telehealth: Payer: Self-pay | Admitting: Gastroenterology

## 2015-01-05 ENCOUNTER — Encounter (HOSPITAL_COMMUNITY): Payer: Self-pay | Admitting: Anesthesiology

## 2015-01-05 NOTE — Telephone Encounter (Signed)
Pt has been re instructed and all questions answered

## 2015-01-06 ENCOUNTER — Ambulatory Visit (HOSPITAL_COMMUNITY)
Admission: RE | Admit: 2015-01-06 | Discharge: 2015-01-06 | Disposition: A | Discharge status: Home / Self Care | Payer: Medicare Other | Source: Ambulatory Visit | Attending: Gastroenterology | Admitting: Gastroenterology

## 2015-01-06 ENCOUNTER — Encounter (HOSPITAL_COMMUNITY)
Admission: RE | Disposition: A | Discharge status: Home / Self Care | Payer: Self-pay | Source: Ambulatory Visit | Attending: Gastroenterology

## 2015-01-06 DIAGNOSIS — D696 Thrombocytopenia, unspecified: Secondary | ICD-10-CM | POA: Diagnosis not present

## 2015-01-06 DIAGNOSIS — Z923 Personal history of irradiation: Secondary | ICD-10-CM | POA: Insufficient documentation

## 2015-01-06 DIAGNOSIS — I10 Essential (primary) hypertension: Secondary | ICD-10-CM | POA: Insufficient documentation

## 2015-01-06 DIAGNOSIS — Z86711 Personal history of pulmonary embolism: Secondary | ICD-10-CM | POA: Insufficient documentation

## 2015-01-06 DIAGNOSIS — E039 Hypothyroidism, unspecified: Secondary | ICD-10-CM | POA: Insufficient documentation

## 2015-01-06 DIAGNOSIS — Z7901 Long term (current) use of anticoagulants: Secondary | ICD-10-CM | POA: Diagnosis not present

## 2015-01-06 DIAGNOSIS — D509 Iron deficiency anemia, unspecified: Secondary | ICD-10-CM | POA: Diagnosis not present

## 2015-01-06 DIAGNOSIS — Z980 Intestinal bypass and anastomosis status: Secondary | ICD-10-CM | POA: Diagnosis not present

## 2015-01-06 DIAGNOSIS — C20 Malignant neoplasm of rectum: Secondary | ICD-10-CM | POA: Diagnosis not present

## 2015-01-06 HISTORY — PX: EUS: SHX5427

## 2015-01-06 SURGERY — ULTRASOUND, LOWER GI TRACT, ENDOSCOPIC
Anesthesia: Moderate Sedation

## 2015-01-06 MED ORDER — FENTANYL CITRATE (PF) 100 MCG/2ML IJ SOLN
INTRAMUSCULAR | Status: DC | PRN
  Administered 2015-01-06 (×2): 25 ug via INTRAVENOUS

## 2015-01-06 MED ORDER — MIDAZOLAM HCL 10 MG/2ML IJ SOLN
INTRAMUSCULAR | Status: AC
  Filled 2015-01-06: qty 2

## 2015-01-06 MED ORDER — SODIUM CHLORIDE 0.9 % IV SOLN: INTRAVENOUS | Status: DC

## 2015-01-06 MED ORDER — PROPOFOL 10 MG/ML IV BOLUS
INTRAVENOUS | Status: AC
  Filled 2015-01-06: qty 20

## 2015-01-06 MED ORDER — ONDANSETRON HCL 4 MG/2ML IJ SOLN
INTRAMUSCULAR | Status: AC
  Filled 2015-01-06: qty 2

## 2015-01-06 MED ORDER — CIPROFLOXACIN IN D5W 400 MG/200ML IV SOLN
INTRAVENOUS | Status: AC
  Filled 2015-01-06: qty 200

## 2015-01-06 MED ORDER — LIDOCAINE HCL (CARDIAC) 20 MG/ML IV SOLN
INTRAVENOUS | Status: AC
  Filled 2015-01-06: qty 5

## 2015-01-06 MED ORDER — CIPROFLOXACIN IN D5W 400 MG/200ML IV SOLN: 400.0000 mg | INTRAVENOUS | Status: DC

## 2015-01-06 MED ORDER — FENTANYL CITRATE (PF) 100 MCG/2ML IJ SOLN
INTRAMUSCULAR | Status: AC
  Filled 2015-01-06: qty 2

## 2015-01-06 MED ORDER — ONDANSETRON HCL 4 MG/2ML IJ SOLN
4.0000 mg | Freq: Once | INTRAMUSCULAR | Status: AC
  Administered 2015-01-06: 4 mg via INTRAVENOUS

## 2015-01-06 MED ORDER — CIPROFLOXACIN HCL 500 MG PO TABS: 500.0000 mg | ORAL_TABLET | Freq: Two times a day (BID) | ORAL | Status: DC

## 2015-01-06 MED ORDER — MIDAZOLAM HCL 10 MG/2ML IJ SOLN
INTRAMUSCULAR | Status: DC | PRN
  Administered 2015-01-06: 1 mg via INTRAVENOUS
  Administered 2015-01-06 (×2): 2 mg via INTRAVENOUS

## 2015-01-06 NOTE — Interval H&P Note (Signed)
History and Physical Interval Note:  01/06/2015 8:07 AM  Richard Warner  has presented today for surgery, with the diagnosis of rectal cancer  The various methods of treatment have been discussed with the patient and family. After consideration of risks, benefits and other options for treatment, the patient has consented to  Procedure(s): LOWER ENDOSCOPIC ULTRASOUND (EUS) (N/A) as a surgical intervention .  The patient's history has been reviewed, patient examined, no change in status, stable for surgery.  I have reviewed the patient's chart and labs.  Questions were answered to the patient's satisfaction.     [name]

## 2015-01-06 NOTE — Discharge Instructions (Signed)

## 2015-01-06 NOTE — Op Note (Signed)
[place], [id number]   ENDOSCOPIC ULTRASOUND PROCEDURE REPORT  PATIENT: Richard Warner, Richard Warner  MR#: [id number] BIRTHDATE: 08/17/1950  GENDER: male ENDOSCOPIST: [name], MD REFERRED BY:  Arturo Morton, M.D. PROCEDURE DATE:  01/06/2015 PROCEDURE:   Lower EUS, with FNA ASA CLASS:      Class III INDICATIONS:   Rectal cancer, stage II (pT3 pN0) status post low anterior resection 12/14/2011.  The tumor was noted to be at 10 cm on a rigid proctoscopy at the time of surgery.  He completed radiation and Xeloda from 01/31/2012-03/11/2012.  He began a first cycle of adjuvant Xeloda on 04/22/2012.  He completed cycle 5 beginning 07/17/2012.  He completed cycle 6 beginning 08/08/2012; rising CEA (up to 24), PET scan shows no clear recurrence. MEDICATIONS: Fentanyl 50 mcg IV and Versed 5 mg IV, cipro 400mg  IV   DESCRIPTION OF PROCEDURE:   After the risks benefits and alternatives of the procedure were  explained, informed consent was obtained. The patient was then placed in the left, lateral, decubitus postion and IV sedation was administered. Throughout the procedure, the patients blood pressure, pulse and oxygen saturations were monitored continuously.  Under direct visualization, the PENTAX EUS SCOPE  endoscope was introduced through the anus  and advanced to the sigmoid colon .  Water was used as necessary to provide an acoustic interface.  Upon completion of the imaging, water was removed and the patient was sent to the recovery room in satisfactory condition.  Sigmoidoscopic findings: 1. LAR anastomosis at  5-6cm from anal verge, appeared normal.  EUS findingins: 1. The surgical anastomosis staple line was clearly visible by EUS. Just distal to this there was non-circumferential, very non-specific thickening of the muscularis propria layer (4-64mm) of the rectal wall. This was NOT clearly mass like however given the clinical question  (signficantly rising CEA) I elected to perform FNA using a single pass with a 25 gauge needle. 2. No perirectal adenopathy.  ENDOSCOPIC IMPRESSION: Non-specific 4-46mm thickening of the rectal wall just distal to the LAR anastomosis.  This was NOT clearly mass-like and possibly represents normal perianstomotic anatomy. Initial cytology review shows benign appearing mucosa.  Await final pathology.  RECOMMENDATIONS: Follow clinically with repeat CEA in several weeks. He will complete 3 days of twice daily cipro.  _______________________________ eSigned:  [name], MD 01/06/2015 9:00 AM revised [place] Copy]  PATIENT NAME:  Richard, Warner MR#: [id number]

## 2015-01-06 NOTE — H&P (View-Only) (Signed)
[place] OFFICE PROGRESS NOTE   Diagnosis: Rectal cancer  INTERVAL HISTORY:   Richard Warner returns as scheduled. He saw Dr. Janice Norrie and was noted to have edema of the left anterior wall of the bladder on a cystoscopy. He was taken to a cystoscopy and bladder biopsy on 12/03/2014. The reddened edematous area was biopsied. The pathology revealed no atypia or malignancy. Denuded urothelium with stromal hemorrhage and fibrosis was noted.  He was taken off of Coumadin for the biopsy procedure. He would like to consider discontinuing Coumadin as he feels this is interfering with his weight loss diet.  Richard Warner reports intermittent discomfort at the right lower back for the past 3-4 months. He reports no discomfort since the cystoscopy procedure.  Objective:  Vital signs in last 24 hours:  Blood pressure 162/67, pulse 62, temperature 98.2 F (36.8 C), temperature source Oral, resp. rate 19, height 5\' 11"  (1.803 m), weight 368 lb 14.4 oz (167.332 kg), SpO2 97 %.    HEENT: Neck without mass Lymphatics: No cervical, supraclavicular, left axillary, or inguinal nodes. Soft 1-1.5 cm lymph node versus prominent fat tissue at the medial right axilla under the pectoralis Resp: Lungs clear bilaterally Cardio: Rate and rhythm GI: No hepatosplenomegaly Vascular: Chronic stasis change at the lower leg bilaterally  Skin: Right upper chest scar without evidence of recurrent tumor     Lab Results:   Lab Results  Component Value Date   CEA 12.9* 10/12/2014    Imaging:  No results found.  Medications: I have reviewed the patient's current medications.  Assessment/Plan: 1. Rectal cancer, stage II (pT3 pN0) status post low anterior resection 12/14/2011. The tumor was noted to be at 10 cm on a rigid proctoscopy at the time of surgery. He completed radiation and Xeloda from 01/31/2012-03/11/2012. He began a first cycle of adjuvant Xeloda on 04/22/2012. He completed cycle 5  beginning 07/17/2012. He completed cycle 6 beginning 08/08/2012.  Surveillance colonoscopy 05/15/2013, status post removal of tubular adenomas. Next colonoscopy 3 years.  Elevated CEA 06/18/2014  PET scan 06/30/2014 with mild focal hypermetabolism at the anorectal junction favored to be physiologic in the absence of associated CT abnormality; irregular bladder wall thickening.  Colonoscopy 07/27/2014 with removal of a sessile ascending colon polyp-tubular adenoma, no other significant findings 2. History of iron deficiency anemia likely secondary to rectal bleeding.  3. 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 He is maintained on Coumadin.  4. Renal insufficiency.  5. Hypertension. 6. Chronic mild thrombocytopenia 7. Hypothyroid. 8. Microscopic hematuria on a urinalysis 07/02/2014  Evaluated by Dr. Janice Norrie including a cystoscopic biopsy of an erythematous/edematous area of the bladder on 12/03/2014-negative for malignancy 9. Right anterior chest wall skin lesion/mass status post excision 09/20/2014. 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 Margins not involved. No evidence of angiolymphatic invasion or perineural invasion.   Disposition:  Richard Warner appears well. The urology evaluation is negative for malignancy to date. The CEA remained elevated after the chest wall squamous cell carcinoma was removed in February. We repeated a CEA today. We will consider additional imaging if the CEA remains elevated.  I recommend he continue anticoagulation therapy secondary to his history of a pulmonary embolism. He has obesity and may have a malignancy. He will continue management of the Coumadin anticoagulation via the [facility] anticoagulation clinic. He could consider switching to xarelto if recommended by Dr. Melvyn Novas and Dr. Ernie Hew.  Richard Warner will be scheduled for an office visit and CEA in 3  months.  Betsy Coder, MD  12/21/2014    11:06 AM

## 2015-01-10 ENCOUNTER — Encounter (HOSPITAL_COMMUNITY): Payer: Self-pay | Admitting: Gastroenterology

## 2015-01-11 ENCOUNTER — Ambulatory Visit (INDEPENDENT_AMBULATORY_CARE_PROVIDER_SITE_OTHER): Payer: Medicare Other | Admitting: *Deleted

## 2015-01-11 DIAGNOSIS — I82409 Acute embolism and thrombosis of unspecified deep veins of unspecified lower extremity: Secondary | ICD-10-CM | POA: Diagnosis not present

## 2015-01-11 DIAGNOSIS — I2699 Other pulmonary embolism without acute cor pulmonale: Secondary | ICD-10-CM | POA: Diagnosis not present

## 2015-01-11 DIAGNOSIS — Z5181 Encounter for therapeutic drug level monitoring: Secondary | ICD-10-CM

## 2015-01-11 DIAGNOSIS — C20 Malignant neoplasm of rectum: Secondary | ICD-10-CM

## 2015-01-11 LAB — POCT INR: INR: 2.1

## 2015-01-12 ENCOUNTER — Other Ambulatory Visit: Payer: Self-pay | Admitting: *Deleted

## 2015-01-12 ENCOUNTER — Telehealth: Payer: Self-pay | Admitting: Oncology

## 2015-01-12 DIAGNOSIS — C20 Malignant neoplasm of rectum: Secondary | ICD-10-CM

## 2015-01-12 NOTE — Telephone Encounter (Signed)
Confirmed appointment for July.

## 2015-02-09 ENCOUNTER — Other Ambulatory Visit: Payer: Medicare Other

## 2015-02-09 ENCOUNTER — Ambulatory Visit (HOSPITAL_BASED_OUTPATIENT_CLINIC_OR_DEPARTMENT_OTHER): Payer: Medicare Other | Admitting: Nurse Practitioner

## 2015-02-09 ENCOUNTER — Ambulatory Visit (HOSPITAL_BASED_OUTPATIENT_CLINIC_OR_DEPARTMENT_OTHER): Payer: Medicare Other

## 2015-02-09 ENCOUNTER — Telehealth: Payer: Self-pay | Admitting: Oncology

## 2015-02-09 ENCOUNTER — Ambulatory Visit (INDEPENDENT_AMBULATORY_CARE_PROVIDER_SITE_OTHER): Payer: Medicare Other | Admitting: *Deleted

## 2015-02-09 VITALS — BP 126/69 | HR 76 | Temp 98.5°F | Resp 18 | Ht 71.0 in | Wt 373.1 lb

## 2015-02-09 DIAGNOSIS — C20 Malignant neoplasm of rectum: Secondary | ICD-10-CM | POA: Diagnosis not present

## 2015-02-09 DIAGNOSIS — N289 Disorder of kidney and ureter, unspecified: Secondary | ICD-10-CM

## 2015-02-09 DIAGNOSIS — I2699 Other pulmonary embolism without acute cor pulmonale: Secondary | ICD-10-CM

## 2015-02-09 DIAGNOSIS — Z86711 Personal history of pulmonary embolism: Secondary | ICD-10-CM

## 2015-02-09 DIAGNOSIS — D696 Thrombocytopenia, unspecified: Secondary | ICD-10-CM

## 2015-02-09 DIAGNOSIS — Z5181 Encounter for therapeutic drug level monitoring: Secondary | ICD-10-CM

## 2015-02-09 DIAGNOSIS — I82409 Acute embolism and thrombosis of unspecified deep veins of unspecified lower extremity: Secondary | ICD-10-CM

## 2015-02-09 DIAGNOSIS — R310 Gross hematuria: Secondary | ICD-10-CM

## 2015-02-09 DIAGNOSIS — Z86718 Personal history of other venous thrombosis and embolism: Secondary | ICD-10-CM

## 2015-02-09 DIAGNOSIS — E039 Hypothyroidism, unspecified: Secondary | ICD-10-CM

## 2015-02-09 DIAGNOSIS — C44529 Squamous cell carcinoma of skin of other part of trunk: Secondary | ICD-10-CM | POA: Diagnosis not present

## 2015-02-09 DIAGNOSIS — I10 Essential (primary) hypertension: Secondary | ICD-10-CM

## 2015-02-09 LAB — CBC WITH DIFFERENTIAL/PLATELET
BASO%: 0.6 % (ref 0.0–2.0)
Basophils Absolute: 0 10*3/uL (ref 0.0–0.1)
EOS%: 2.6 % (ref 0.0–7.0)
Eosinophils Absolute: 0.1 10*3/uL (ref 0.0–0.5)
HEMATOCRIT: 29.2 % — AB (ref 38.4–49.9)
HEMOGLOBIN: 9.6 g/dL — AB (ref 13.0–17.1)
LYMPH#: 0.5 10*3/uL — AB (ref 0.9–3.3)
LYMPH%: 11.7 % — AB (ref 14.0–49.0)
MCH: 28.2 pg (ref 27.2–33.4)
MCHC: 32.8 g/dL (ref 32.0–36.0)
MCV: 85.9 fL (ref 79.3–98.0)
MONO#: 0.5 10*3/uL (ref 0.1–0.9)
MONO%: 10.5 % (ref 0.0–14.0)
NEUT#: 3.3 10*3/uL (ref 1.5–6.5)
NEUT%: 74.6 % (ref 39.0–75.0)
PLATELETS: 208 10*3/uL (ref 140–400)
RBC: 3.4 10*6/uL — ABNORMAL LOW (ref 4.20–5.82)
RDW: 13.9 % (ref 11.0–14.6)
WBC: 4.4 10*3/uL (ref 4.0–10.3)

## 2015-02-09 LAB — POCT INR: INR: 1.9

## 2015-02-09 LAB — HOLD TUBE, BLOOD BANK

## 2015-02-09 LAB — CEA: CEA: 30.9 ng/mL — ABNORMAL HIGH (ref 0.0–5.0)

## 2015-02-09 NOTE — Progress Notes (Addendum)
[facility] OFFICE PROGRESS NOTE   Diagnosis:  Rectal cancer  INTERVAL HISTORY:   Richard Warner returns as scheduled. He reports intermittent hematuria since the bladder biopsy. He is pushing fluids. He states that he is tired. He denies chest pain. He notes increased dyspnea on exertion. Appetite is poor. He reports his weight is stable. Bowels moving. He reports a recent adjustment of his Synthroid dose.  Objective:  Vital signs in last 24 hours:  Blood pressure 126/69, pulse 76, temperature 98.5 F (36.9 C), temperature source Oral, resp. rate 18, height 5\' 11"  (1.803 m), weight 373 lb 1.6 oz (169.237 kg), SpO2 97 %.    HEENT: No thrush or ulcers. Lymphatics: No palpable cervical, supraclavicular, axillary or inguinal lymph nodes. Resp: Lungs clear bilaterally. Cardio: Regular rate and rhythm. GI: Abdomen obese. No obvious organomegaly. Vascular: Chronic stasis changes the lower legs bilaterally.  Skin: Right upper chest scar without evidence of recurrent tumor.    Lab Results:  Lab Results  Component Value Date   WBC 4.7 11/25/2014   HGB 15.1 11/25/2014   HCT 44.5 11/25/2014   MCV 89.5 11/25/2014   PLT 123* 11/25/2014   NEUTROABS 2.8 09/08/2013    Imaging:  No results found.  Medications: I have reviewed the patient's current medications.  Assessment/Plan: 1. Rectal cancer, stage II (pT3 pN0) status post low anterior resection 12/14/2011. The tumor was noted to be at 10 cm on a rigid proctoscopy at the time of surgery. He completed radiation and Xeloda from 01/31/2012-03/11/2012. He began a first cycle of adjuvant Xeloda on 04/22/2012. He completed cycle 5 beginning 07/17/2012. He completed cycle 6 beginning 08/08/2012.  Surveillance colonoscopy 05/15/2013, status post removal of tubular adenomas. Next colonoscopy 3 years.  Elevated CEA 06/18/2014  PET scan 06/30/2014 with mild focal hypermetabolism at the anorectal junction favored to be  physiologic in the absence of associated CT abnormality; irregular bladder wall thickening.  Colonoscopy 07/27/2014 with removal of a sessile ascending colon polyp-tubular adenoma, no other significant findings  PET scan 12/27/2014: status post low anterior resection. No evidence of hypermetabolic recurrent or metastatic disease. Similar hypermetabolism about the anus.  Lower endoscopic ultrasound 01/06/2015 with findings of nonspecific 4-5 mm thickening of the rectal wall just distal to the LAR anastomosis. Fine-needle aspiration was negative for malignant cells. 2. History of iron deficiency anemia likely secondary to rectal bleeding.  3. 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 He is maintained on Coumadin.  4. Renal insufficiency.  5. Hypertension. 6. Chronic mild thrombocytopenia 7. Hypothyroid. 8. Microscopic hematuria on a urinalysis 07/02/2014  Evaluated by Dr. Janice Norrie including a cystoscopic biopsy of an erythematous/edematous area of the bladder on 12/03/2014-negative for malignancy 9. Right anterior chest wall skin lesion/mass status post excision 09/20/2014. 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 Margins not involved. No evidence of angiolymphatic invasion or perineural invasion. 10. Gross hematuria, intermittent since bladder biopsy 12/03/2014.   Disposition: We will follow-up on the CEA from today. He continues to have intermittent gross hematuria. We will obtain a CBC today. Dr. Benay Spice will discuss his case with Dr. Janice Norrie. He will return for a follow-up visit in approximately 4 weeks with a CEA. He will contact the office in the interim with any problems.  Patient seen with Dr. Benay Spice.    Ned Card ANP/GNP-BC   02/09/2015  10:42 AM  This was a shared visit with Ned Card. Richard Warner had rising CEA. I remain concerned he has recurrent rectal cancer or  another malignancy. I discussed the case with Dr.  Janice Norrie. The plan is for a repeat cystoscopy.  Richard Warner has developed severe anemia secondary to hematuria. He declined a red cell transfusion.  Julieanne Manson, M.D.

## 2015-02-09 NOTE — Telephone Encounter (Signed)
Gave and printed appt sched and avs for pt for Aug °

## 2015-02-15 ENCOUNTER — Other Ambulatory Visit: Payer: Self-pay | Admitting: Urology

## 2015-02-16 NOTE — Patient Instructions (Addendum)
JAI STEIL  02/16/2015   Your procedure is scheduled on:    7/20./2016    Report to [facility] Main  Entrance take [facility]  elevators to 3rd floor to  [place] at        [place] AM.  Call this number if you have problems the morning of surgery [phone]   Remember: ONLY 1 PERSON MAY GO WITH YOU TO SHORT STAY TO GET  READY MORNING OF [place].  Do not eat food or drink liquids :After Midnight.  Bowel Prep: follow surgeons instruction.              Coumadin:  Follow surgeons insrtuctions   Take these medicines the morning of surgery with A SIP OF WATER:   Synthroid ( Levothyroxine )                                You may not have any metal on your body including hair pins and              piercings  Do not wear jewelry, , lotions, powders or perfumes, deodorant                        Men may shave face and neck.   Do not bring valuables to the hospital. [place].  Contacts, dentures or bridgework may not be worn into surgery.       Patients discharged the day of surgery will not be allowed to drive home.  Name and phone number of your driver:  Special Instructions: coughing and deep breathing exercises, leg exercises               Please read over the following fact sheets you were given: _____________________________________________________________________             [facility] - Preparing for Surgery Before surgery, you can play an important role.  Because skin is not sterile, your skin needs to be as free of germs as possible.  You can reduce the number of germs on your skin by washing with CHG (chlorahexidine gluconate) soap before surgery.  CHG is an antiseptic cleaner which kills germs and bonds with the skin to continue killing germs even after washing. Please DO NOT use if you have an allergy to CHG or antibacterial soaps.  If your skin becomes reddened/irritated stop using the CHG  and inform your nurse when you arrive at Short Stay. Do not shave (including legs and underarms) for at least 48 hours prior to the first CHG shower.  You may shave your face/neck. Please follow these instructions carefully:  1.  Shower with CHG Soap the night before surgery and the  morning of Surgery.  2.  If you choose to wash your hair, wash your hair first as usual with your  normal  shampoo.  3.  After you shampoo, rinse your hair and body thoroughly to remove the  shampoo.                           4.  Use CHG as you would any other liquid soap.  You can apply chg directly  to the  skin and wash                       Gently with a scrungie or clean washcloth.  5.  Apply the CHG Soap to your body ONLY FROM THE NECK DOWN.   Do not use on face/ open                           Wound or open sores. Avoid contact with eyes, ears mouth and genitals (private parts).                       Wash face,  Genitals (private parts) with your normal soap.             6.  Wash thoroughly, paying special attention to the area where your surgery  will be performed.  7.  Thoroughly rinse your body with warm water from the neck down.  8.  DO NOT shower/wash with your normal soap after using and rinsing off  the CHG Soap.                9.  Pat yourself dry with a clean towel.            10.  Wear clean pajamas.            11.  Place clean sheets on your bed the night of your first shower and do not  sleep with pets. Day of Surgery : Do not apply any lotions/deodorants the morning of surgery.  Please wear clean clothes to the hospital/surgery center.  FAILURE TO FOLLOW THESE INSTRUCTIONS MAY RESULT IN THE CANCELLATION OF YOUR SURGERY PATIENT SIGNATURE_________________________________  NURSE SIGNATURE__________________________________  ________________________________________________________________________

## 2015-02-17 ENCOUNTER — Encounter (HOSPITAL_COMMUNITY): Payer: Self-pay

## 2015-02-17 ENCOUNTER — Encounter (HOSPITAL_COMMUNITY)
Admission: RE | Admit: 2015-02-17 | Discharge: 2015-02-17 | Disposition: A | Discharge status: Home / Self Care | Payer: Medicare Other | Source: Ambulatory Visit | Attending: Urology | Admitting: Urology

## 2015-02-17 DIAGNOSIS — Z01812 Encounter for preprocedural laboratory examination: Secondary | ICD-10-CM | POA: Diagnosis present

## 2015-02-17 DIAGNOSIS — N329 Bladder disorder, unspecified: Secondary | ICD-10-CM | POA: Insufficient documentation

## 2015-02-17 LAB — BASIC METABOLIC PANEL
Anion gap: 6 (ref 5–15)
BUN: 22 mg/dL — AB (ref 6–20)
CALCIUM: 8.9 mg/dL (ref 8.9–10.3)
CHLORIDE: 108 mmol/L (ref 101–111)
CO2: 25 mmol/L (ref 22–32)
Creatinine, Ser: 1.54 mg/dL — ABNORMAL HIGH (ref 0.61–1.24)
GFR calc Af Amer: 53 mL/min — ABNORMAL LOW (ref >60)
GFR, EST NON AFRICAN AMERICAN: 46 mL/min — AB (ref >60)
GLUCOSE: 136 mg/dL — AB (ref 65–99)
Potassium: 4.6 mmol/L (ref 3.5–5.1)
Sodium: 139 mmol/L (ref 135–145)

## 2015-02-17 LAB — CBC
HCT: 29.9 % — ABNORMAL LOW (ref 39.0–52.0)
HEMOGLOBIN: 9.5 g/dL — AB (ref 13.0–17.0)
MCH: 26.5 pg (ref 26.0–34.0)
MCHC: 31.8 g/dL (ref 30.0–36.0)
MCV: 83.5 fL (ref 78.0–100.0)
Platelets: 178 10*3/uL (ref 150–400)
RBC: 3.58 MIL/uL — AB (ref 4.22–5.81)
RDW: 13.3 % (ref 11.5–15.5)
WBC: 5 10*3/uL (ref 4.0–10.5)

## 2015-02-17 LAB — PROTIME-INR
INR: 1.88 — AB (ref 0.00–1.49)
Prothrombin Time: 21.5 seconds — ABNORMAL HIGH (ref 11.6–15.2)

## 2015-02-17 LAB — APTT: aPTT: 35 seconds (ref 24–37)

## 2015-02-17 NOTE — Progress Notes (Addendum)
02-09-15 - LOV (oncologist) Dr. Marcello Moores - [facility] 09-13-14 - EKG - [facility] 09-13-14 - CXR -[facility] 04-05-14 - LOV with pulmonary - [facility]  Hx. Of DVT, Pulmonary Embolism

## 2015-02-17 NOTE — Progress Notes (Signed)
02-17-15 - Faxed CBC, BMP and PT labs drawn at preop appt. On 02-17-15 via [facility]

## 2015-02-17 NOTE — Progress Notes (Signed)
   02/17/15 0941  OBSTRUCTIVE SLEEP APNEA  Have you ever been diagnosed with sleep apnea through a sleep study? No  Do you snore loudly (loud enough to be heard through closed doors)?  0  Do you often feel tired, fatigued, or sleepy during the daytime? 1  Has anyone observed you stop breathing during your sleep? 0  Do you have, or are you being treated for high blood pressure? 1  BMI more than 35 kg/m2? 1  Age over 65 years old? 1  Neck circumference greater than 40 cm/16 inches? 1  Gender: 1  Obstructive Sleep Apnea Score 6

## 2015-02-18 NOTE — Progress Notes (Signed)
02-18-15 - Faxed lab results from preop visit on 02-17-15 (CBC, BMP, PT) to Dr. Janice Norrie via [facility]

## 2015-02-22 ENCOUNTER — Encounter: Payer: Self-pay | Admitting: Neurology

## 2015-02-22 ENCOUNTER — Ambulatory Visit (INDEPENDENT_AMBULATORY_CARE_PROVIDER_SITE_OTHER): Payer: Medicare Other | Admitting: Neurology

## 2015-02-22 DIAGNOSIS — I82401 Acute embolism and thrombosis of unspecified deep veins of right lower extremity: Secondary | ICD-10-CM

## 2015-02-22 DIAGNOSIS — Z85048 Personal history of other malignant neoplasm of rectum, rectosigmoid junction, and anus: Secondary | ICD-10-CM

## 2015-02-22 DIAGNOSIS — R0683 Snoring: Secondary | ICD-10-CM | POA: Diagnosis not present

## 2015-02-22 DIAGNOSIS — I2699 Other pulmonary embolism without acute cor pulmonale: Secondary | ICD-10-CM | POA: Diagnosis not present

## 2015-02-22 DIAGNOSIS — R351 Nocturia: Secondary | ICD-10-CM | POA: Diagnosis not present

## 2015-02-22 MED ORDER — DEXTROSE 5 % IV SOLN
3.0000 g | INTRAVENOUS | Status: AC
  Administered 2015-02-23: 3 g via INTRAVENOUS
  Filled 2015-02-22: qty 3000

## 2015-02-22 NOTE — Progress Notes (Signed)
Subjective:    Patient ID: Richard Warner is a 64 y.o. male.  HPI     Star Age, MD, PhD [facility] [address], [address] [place],  [address]  Dear Dr. Ernie Hew,   I saw your patient, Richard Warner, upon your kind request, in my neurologic clinic today for initial consultation of Richard Warner sleep disorder, in particular, concern for underlying obstructive sleep apnea. The patient is unaccompanied today. As you know, Richard Warner is a 64 year old right-handed gentleman with an underlying complex medical history of hypothyroidism, DVT and PE, hyperlipidemia, chronic kidney disease, rectal cancer (diagnosed in 2013, treated with surgery and chemo-radiation), morbid obesity, pulmonary edema, peripheral edema, hypothyroidism, vitamin D deficiency and hypertension, who reports snoring and excessive daytime somnolence. He has nocturia once per night and denies morning headaches. He denies restless leg symptoms. He is not aware of any sleep apnea in the family. He gained a lot of weight after he was diagnosed with Richard Warner cancer. He had a DVT and PE in 2011. He usually goes to bed between 11:30 PM and midnight. He has no trouble falling asleep. He does wake up in the middle of the night. He has to go to the bathroom once in the early morning and then goes back to bed. He takes Richard Warner thyroid medicine when he goes to the bathroom in the early morning. Richard Warner Epworth sleepiness score is 4 out of 24 today. Richard Warner fatigue scores 38 out of 63. Richard Warner rise time varies. He does not watch TV in bed. He does not drink caffeine every day but likes to drink diet cola and sweet tea a few times a week. He is a nonsmoker. He does not drink alcohol. He lives with Richard Warner mother. He is single and used to drive trucks. He is on disability. He has a 87 year old daughter.  Richard Warner Past Medical History Is Significant For: Past Medical History  Diagnosis Date  . Hypertension   . Pulmonary embolism  12-03-11    01-17-10 s/p DVT left leg -Coumadin tx.until 08-28-11  . Hyperlipidemia   . Leg swelling 12-03-11    bilateral if up most of day,equally swells, up to knee level, improved now  . Fractures 12-03-11    s/p right ankle and right wrist- no problems now  . Hypothyroidism   . Dyslipidemia   . Anemia 12-03-11    tx. oral iron supplement  . Arthritis 12-03-11    mild lower back  . 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 Chronic kidney disease 12-03-11    kidney stone x1 ,none recent stage III  . Secondary hyperparathyroidism, renal   . Chronic anticoagulation   . Shortness of breath 12-03-11    hx. 6'11- during pulmonary emboli episode, none now, extreme exertion only.  . Morbid obesity   . Vitamin D deficiency   . Chronic pulmonary edema   . Dorsalgia     Richard Warner Past Surgical History Is Significant For: Past Surgical History  Procedure Laterality Date  . Colonoscopy  10/08/2011    Procedure: COLONOSCOPY;  Surgeon: Inda Castle, MD;  Location: [facility];  Service: Endoscopy;  Laterality: N/A;  . Tonsillectomy  12-03-11    child  . Proctoscopy  12/14/2011    Procedure: PROCTOSCOPY;  Surgeon: [name], MD;  Location: [facility];  Service: General;  Laterality:  N/A;  . Low anterior bowel resection  12/14/2011    Mid/proximal rectal cancer. T3 N0  . Colonoscopy N/A 05/15/2013    Procedure: COLONOSCOPY;  Surgeon: Inda Castle, MD;  Location: [facility];  Service: Endoscopy;  Laterality: N/A;  . Colonoscopy N/A 07/27/2014    Procedure: COLONOSCOPY;  Surgeon: Inda Castle, MD;  Location: [facility];  Service: Endoscopy;  Laterality: N/A;  . Cyst removal trunk N/A 09/17/2014    Procedure: REMOVAL OF ANTERIOR CHEST WALL SKIN MASS;  Surgeon: Michael Boston, MD;  Location: [facility];  Service: General;   Laterality: N/A;  . Cystoscopy with biopsy N/A 12/03/2014    Procedure: CYSTOSCOPY WITH BIOPSY;  Surgeon: Lowella Bandy, MD;  Location: [facility];  Service: Urology;  Laterality: N/A;  . Eus N/A 01/06/2015    Procedure: LOWER ENDOSCOPIC ULTRASOUND (EUS);  Surgeon: [name], MD;  Location: [facility];  Service: Endoscopy;  Laterality: N/A;    Richard Warner Family History Is Significant For: Family History  Problem Relation Age of Onset  . Diabetes Father   . Heart disease Father   . Stroke Maternal Grandmother   . Stroke Maternal Grandfather   . Hypertension Paternal Grandfather   . Lung cancer Paternal Grandfather   . Cancer Paternal Grandfather     Richard Warner Social History Is Significant For: History   Social History  . Marital Status: Single    Spouse Name: N/A  . Number of Children: 1  . Years of Education: HS    Occupational History  . Disability     Social History Main Topics  . Smoking status: Never Smoker   . Smokeless tobacco: Never Used  . Alcohol Use: No  . Drug Use: No  . Sexual Activity: Not on file   Other Topics Concern  . None   Social History Narrative   Drink 2-3 Diet cokes a week     Richard Warner Allergies Are:  No Known Allergies:   Richard Warner Current Medications Are:  Outpatient Encounter Prescriptions as of 02/22/2015  Medication Sig  . aspirin EC 81 MG tablet Take 81 mg by mouth every morning.   [name] atenolol (TENORMIN) 100 MG tablet Take 100 mg by mouth every evening.   . Cholecalciferol (VITAMIN D3) 5000 UNITS TABS Take 1 capsule by mouth at bedtime.   [name] doxazosin (CARDURA) 2 MG tablet Take 2 mg by mouth at bedtime.  [name] ezetimibe (ZETIA) 10 MG tablet Take 10 mg by mouth at bedtime.   . furosemide (LASIX) 20 MG tablet Take 10 mg by mouth at bedtime.   [name] levothyroxine (SYNTHROID, LEVOTHROID) 88 MCG tablet Take 88 mcg by mouth daily before breakfast.  . simvastatin (ZOCOR) 40 MG tablet Take 40 mg by mouth every evening.  . warfarin (COUMADIN) 5 MG tablet TAKE AS DIRECTED BY  ANTICOAGULATION CLINIC (Patient taking differently: Take 2.5-5 mg by mouth daily. Takes 1 tablet everyday except for Saturday takes 1/2 tablet)   No facility-administered encounter medications on file as of 02/22/2015.  :  Review of Systems:  Out of a complete 14 point review of systems, all are reviewed and negative with the exception of these symptoms as listed below:   Review of Systems  Constitutional: Positive for fatigue.  Respiratory: Positive for shortness of breath.   Cardiovascular: Positive for leg swelling.  Neurological:       No trouble falling or staying asleep, snoring, no witnessed apnea, no choking reported, wakes up in the morning feeling tired, tired throughout day,  reports taking naps during the day.   Psychiatric/Behavioral:       Decreased energy     Objective:  Neurologic Exam  Physical Exam Physical Examination:   Filed Vitals:   02/22/15 1010  BP: 119/71  Pulse: 63  Resp: 18    General Examination: The patient is a very pleasant 64 y.o. male in no acute distress. He appears well-developed and well-nourished and adequately groomed. He is morbidly obese.  HEENT: Normocephalic, atraumatic, pupils are equal, round and reactive to light and accommodation. Funduscopic exam is normal with sharp disc margins noted. Extraocular tracking is good without limitation to gaze excursion or nystagmus noted. Normal smooth pursuit is noted. Hearing is grossly intact. Tympanic membranes are clear bilaterally. Face is symmetric with normal facial animation and normal facial sensation. Speech is clear with no dysarthria noted. There is no hypophonia. There is no lip, neck/head, jaw or voice tremor. Neck is supple with full range of passive and active motion. There are no carotid bruits on auscultation. Oropharynx exam reveals: mild mouth dryness, adequate dental hygiene and mild airway crowding, due to wider uvula. Mallampati is class I. Tongue protrudes centrally and palate  elevates symmetrically. Tonsils are absent. Neck size is 22.5 inches. He has a Mild overbite. Nasal inspection reveals no significant nasal mucosal bogginess or redness and no septal deviation.   Chest: Clear to auscultation without wheezing, rhonchi or crackles noted.  Heart: S1+S2+0, regular and normal without murmurs, rubs or gallops noted.   Abdomen: Soft, non-tender and non-distended with normal bowel sounds appreciated on auscultation.  Extremities: There is 3+ pitting edema in the distal lower extremities bilaterally. Chronic discoloration is noted in both distal lower extremities with thickening of the skin. Pedal pulses are intact.   Skin: Warm and dry without trophic changes noted.  Musculoskeletal: exam reveals no obvious joint deformities, tenderness or joint swelling or erythema.   Neurologically:   Mental status: The patient is awake, alert and oriented in all 4 spheres. Richard Warner immediate and remote memory, attention, language skills and fund of knowledge are appropriate. There is no evidence of aphasia, agnosia, apraxia or anomia. Speech is clear with normal prosody and enunciation. Thought process is linear. Mood is normal and affect is normal.  Cranial nerves II - XII are as described above under HEENT exam. In addition: shoulder shrug is normal with equal shoulder height noted. Motor exam: Normal bulk, strength and tone is noted. There is no drift, tremor or rebound. Romberg is negative. Reflexes are 2+ throughout. Babinski: Toes are flexor bilaterally. Fine motor skills and coordination: intact with normal finger taps, normal hand movements, normal rapid alternating patting, normal foot taps and normal foot agility.  Cerebellar testing: No dysmetria or intention tremor on finger to nose testing. Heel to shin is unremarkable bilaterally. There is no truncal or gait ataxia.  Sensory exam: intact to light touch, pinprick, vibration, temperature sense in the upper and lower  extremities.  Gait, station and balance: He stands with difficulty. No veering to one side is noted. No leaning to one side is noted. Posture is age-appropriate and stance is narrow based. Gait shows normal stride length and normal pace. No problems turning are noted. He turns en bloc. Tandem walk is difficult for him.       Assessment and Plan:   In summary, Richard Warner is a very pleasant 64 y.o.-year old male with an underlying complex medical history of hypothyroidism, DVT and PE, hyperlipidemia, chronic kidney disease, rectal cancer (  diagnosed in 2013, treated with surgery and chemo-radiation), morbid obesity, pulmonary edema, peripheral edema, hypothyroidism, vitamin D deficiency and hypertension, who reports snoring, nocturia and excessive daytime somnolence. Richard Warner history and physical exam are indeed concerning for obstructive sleep apnea (OSA). I had a long chat with the patient about my findings and the diagnosis of OSA, its prognosis and treatment options. We talked about medical treatments, surgical interventions and non-pharmacological approaches. I explained in particular the risks and ramifications of untreated moderate to severe OSA, especially with respect to developing cardiovascular disease down the Road, including congestive heart failure, difficult to treat hypertension, cardiac arrhythmias, or stroke. Even type 2 diabetes has, in part, been linked to untreated OSA. Symptoms of untreated OSA include daytime sleepiness, memory problems, mood irritability and mood disorder such as depression and anxiety, lack of energy, as well as recurrent headaches, especially morning headaches. We talked about trying to maintain a healthy lifestyle in general, as well as the importance of weight control. I encouraged the patient to eat healthy, exercise daily and keep well hydrated, to keep a scheduled bedtime and wake time routine, to not skip any meals and eat healthy snacks in between meals. I  advised the patient not to drive when feeling sleepy. I recommended the following at this time: sleep study with potential positive airway pressure titration. (We will score hypopneas at 4% and split the sleep study into diagnostic and treatment portion, if the estimated. 2 hour AHI is >15/h).   I explained the sleep test procedure to the patient and also outlined possible surgical and non-surgical treatment options of OSA, including the use of a custom-made dental device (which would require a referral to a specialist dentist or oral surgeon), upper airway surgical options, such as pillar implants, radiofrequency surgery, tongue base surgery, and UPPP (which would involve a referral to an ENT surgeon). Rarely, jaw surgery such as mandibular advancement may be considered.  I also explained the CPAP treatment option to the patient, who indicated that he would be willing to try CPAP if the need arises. I explained the importance of being compliant with PAP treatment, not only for insurance purposes but primarily to improve Richard Warner symptoms, and for the patient's long term health benefit, including to reduce Richard Warner cardiovascular risks. I answered all Richard Warner questions today and the patient was in agreement. I would like to see him back after the sleep study is completed and encouraged him to call with any interim questions, concerns, problems or updates.   Thank you very much for allowing me to participate in the care of this nice patient. If I can be of any further assistance to you please do not hesitate to call me at [phone].  Sincerely,   Star Age, MD, PhD

## 2015-02-22 NOTE — H&P (Signed)
History of Present Illness Richard Warner has been having gross hematuria with blood clots on and off for the past 3-4 weeks. Recent PET scan did not indicate any obstructive uropathy.  He is s/p cystoscopy/bladder biopsy on 12/03/14. He has a history of rectal cancer diagnosed in May 2013. Bladder biopsy showed chronic inflammation, no malignancy. He still has gross hematuria on and off. As per Dr Benay Spice his CEA continues to go up. He had a recent anastomotic biopsy that showed no malignant cells. Cystoscopy today shows a sessile mass in the anterior wall of the bladder. I asked Dr Risa Grill for his opinion regarding the bladder lesion and he agrees that he needs TUR biopsy of the bladder mass. It appears slightly enlarged compared to the cystoscopy findings of 2 months ago. He needs cystoscopy and repeat bladder biopsy under anesthesia. The procedure, risks, benefits were reviewed with the patient. He understands and is agreeable. Recent PET scan showed no renal mass.   BP: 154/66  P: 65  R: 18  T: 97.7  Wt:  366 lbs   Past Medical History Problems  1. History of Colorectal cancer (C19) 2. History of malignant melanoma (Z85.820) 3. History of pulmonary embolism (Z86.711)  Surgical History Problems  1. History of Colon Surgery 2. History of Cystoscopy With Biopsy 3. History of Skin Debridement 4. History of Tonsillectomy  Current Meds 1. Aspirin 81 MG TABS;  Therapy: (Recorded:13Apr2016) to Recorded 2. Atenolol 100 MG Oral Tablet;  Therapy: (Recorded:13Apr2016) to Recorded 3. Doxazosin Mesylate 2 MG Oral Tablet;  Therapy: (Recorded:13Apr2016) to Recorded 4. Levothyroxine Sodium 75 MCG Oral Tablet;  Therapy: (Recorded:13Apr2016) to Recorded 5. Simvastatin 40 MG Oral Tablet;  Therapy: (Recorded:13Apr2016) to Recorded 6. Vitamin D TABS;  Therapy: (Recorded:13Apr2016) to Recorded 7. Zetia 10 MG Oral Tablet;  Therapy: (Recorded:13Apr2016) to Recorded  Allergies Medication  1. No  Known Drug Allergies  Family History Problems  1. Family history of diabetes mellitus (Z83.3) : Father  Social History Problems  1. Denied: History of Alcohol use 2. Caffeine use (F15.90) 3. Divorced 4. Father deceased   54 5. Mother alive and healthy   30 6. Never a smoker 7. Number of children   1 daughter 8. Retired  Review of Systems Genitourinary, constitutional, skin, eye, otolaryngeal, hematologic/lymphatic, cardiovascular, pulmonary, endocrine, musculoskeletal, gastrointestinal, neurological and psychiatric system(s) were reviewed and pertinent findings if present are noted and are otherwise negative.  Genitourinary: hematuria.  Constitutional: feeling tired (fatigue).    Physical Exam Constitutional: Well nourished and well developed . No acute distress.  ENT:. The ears and nose are normal in appearance.  Neck: The appearance of the neck is normal and no neck mass is present.  Pulmonary: No respiratory distress and normal respiratory rhythm and effort.  Cardiovascular: Heart rate and rhythm are normal . No peripheral edema.  Abdomen: The abdomen is obese. The abdomen is soft and nontender. No masses are palpated. No CVA tenderness. No hernias are palpable. No hepatosplenomegaly noted.  Genitourinary: Examination of the penis demonstrates no discharge, no masses, no lesions and a normal meatus. The scrotum is without lesions. The right epididymis is palpably normal and non-tender. The left epididymis is palpably normal and non-tender. The right testis is non-tender and without masses. The left testis is non-tender and without masses.  Lymphatics: The femoral and inguinal nodes are not enlarged or tender.  Skin: Normal skin turgor, no visible rash and no visible skin lesions.  Neuro/Psych:. Mood and affect are appropriate.    Results/Data  Urine [Data Includes: Last 1 Day]   [id number]  COLOR YELLOW   APPEARANCE CLEAR   SPECIFIC GRAVITY 1.010   pH 6.0   GLUCOSE NEG  mg/dL  BILIRUBIN NEG   KETONE NEG mg/dL  BLOOD LARGE   PROTEIN NEG mg/dL  UROBILINOGEN 0.2 mg/dL  NITRITE NEG   LEUKOCYTE ESTERASE NEG   SQUAMOUS EPITHELIAL/HPF FEW   WBC 3-6 WBC/hpf  RBC 7-10 RBC/hpf  BACTERIA RARE   CRYSTALS NONE SEEN   CASTS NONE SEEN    Procedure  Procedure: Cystoscopy   Indication: Hematuria.  Informed Consent: Risks, benefits, and potential adverse events were discussed and informed consent was obtained from the patient . Specific risks including, but not limited to bleeding, infection, pain, allergic reaction etc. were explained.  Prep: The patient was prepped with betadine.  Anesthesia:. Local anesthesia was administered intraurethrally with 2% lidocaine jelly.  Procedure Note:  Urethral meatus:. No abnormalities.  Anterior urethra: No abnormalities.  Prostatic urethra:. The lateral prostatic lobes were enlarged.  Bladder: Visulization was clear. The ureteral orifices were in the normal anatomic position bilaterally. A nodular tumor was seen in the bladder. This tumor was located on the left side, on the anterior aspect of the bladder. The patient tolerated the procedure well.  Complications: None.    Assessment Assessed  1. 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 Gross hematuria (R31.0)  Plan Gross hematuria  1. URINE CYTOLOGY; Status:In Progress - Specimen/Data Collected;   Done: [id number] Health Maintenance  2. UA With REFLEX; [Do Not Release]; Status:Resulted - Requires Verification;   Done:  [id number] 01:26PM  Urine cytology. He needs cystoscopy. TUR Bladder mass. I asked Dr Risa Grill for his assistance since it was very difficult to biopsy the mass 2 months ago.

## 2015-02-22 NOTE — Patient Instructions (Signed)

## 2015-02-22 NOTE — Anesthesia Preprocedure Evaluation (Addendum)
Anesthesia Evaluation  Patient identified by MRN, date of birth, ID band Patient awake    Reviewed: Allergy & Precautions, H&P , NPO status , Patient's Chart, lab work & pertinent test results, reviewed documented beta blocker date and time   Airway Mallampati: III  TM Distance: >3 FB Neck ROM: Limited    Dental  (+) Dental Advisory Given, Missing Left upper front tooth missing:   Pulmonary shortness of breath and with exertion, PE breath sounds clear to auscultation  Pulmonary exam normal       Cardiovascular Exercise Tolerance: Good hypertension, Pt. on medications and Pt. on home beta blockers +CHF and DVT (HX PE) Normal cardiovascular examRhythm:Regular Rate:Normal  ECHO 2013 EF 65%. Chronic pulmonary edema   Neuro/Psych negative neurological ROS  negative psych ROS   GI/Hepatic negative GI ROS, Neg liver ROS,   Endo/Other  Hypothyroidism Morbid obesitySecondary hyperparathyroidism  Renal/GU Renal InsufficiencyRenal diseaseCreat 1.54  negative genitourinary   Musculoskeletal   Abdominal (+) + obese,   Peds  Hematology negative hematology ROS (+) anemia , hgb 9.5   Anesthesia Other Findings   Reproductive/Obstetrics negative OB ROS                           Anesthesia Physical Anesthesia Plan  ASA: IV  Anesthesia Plan: General   Post-op Pain Management:    Induction: Intravenous  Airway Management Planned: LMA  Additional Equipment:   Intra-op Plan:   Post-operative Plan:   Informed Consent: I have reviewed the patients History and Physical, chart, labs and discussed the procedure including the risks, benefits and alternatives for the proposed anesthesia with the patient or authorized representative who has indicated his/her understanding and acceptance.   Dental Advisory Given  Plan Discussed with: CRNA and Surgeon  Anesthesia Plan Comments:        Anesthesia Quick  Evaluation

## 2015-02-23 ENCOUNTER — Encounter (HOSPITAL_COMMUNITY)
Admission: RE | Disposition: A | Discharge status: Home / Self Care | Payer: Self-pay | Source: Ambulatory Visit | Attending: Urology

## 2015-02-23 ENCOUNTER — Ambulatory Visit (HOSPITAL_COMMUNITY)
Admission: RE | Admit: 2015-02-23 | Discharge: 2015-02-23 | Disposition: A | Discharge status: Home / Self Care | Payer: Medicare Other | Source: Ambulatory Visit | Attending: Urology | Admitting: Urology

## 2015-02-23 ENCOUNTER — Encounter (HOSPITAL_COMMUNITY): Payer: Self-pay | Admitting: Certified Registered Nurse Anesthetist

## 2015-02-23 ENCOUNTER — Ambulatory Visit (HOSPITAL_COMMUNITY): Payer: Medicare Other | Admitting: Anesthesiology

## 2015-02-23 DIAGNOSIS — Z86711 Personal history of pulmonary embolism: Secondary | ICD-10-CM | POA: Insufficient documentation

## 2015-02-23 DIAGNOSIS — D414 Neoplasm of uncertain behavior of bladder: Secondary | ICD-10-CM | POA: Diagnosis present

## 2015-02-23 DIAGNOSIS — Z85820 Personal history of malignant melanoma of skin: Secondary | ICD-10-CM | POA: Diagnosis not present

## 2015-02-23 DIAGNOSIS — E039 Hypothyroidism, unspecified: Secondary | ICD-10-CM | POA: Diagnosis not present

## 2015-02-23 DIAGNOSIS — Z85038 Personal history of other malignant neoplasm of large intestine: Secondary | ICD-10-CM | POA: Diagnosis not present

## 2015-02-23 DIAGNOSIS — R310 Gross hematuria: Secondary | ICD-10-CM | POA: Diagnosis not present

## 2015-02-23 DIAGNOSIS — N400 Enlarged prostate without lower urinary tract symptoms: Secondary | ICD-10-CM | POA: Diagnosis not present

## 2015-02-23 DIAGNOSIS — Z6841 Body Mass Index (BMI) 40.0 and over, adult: Secondary | ICD-10-CM | POA: Insufficient documentation

## 2015-02-23 DIAGNOSIS — Z79899 Other long term (current) drug therapy: Secondary | ICD-10-CM | POA: Insufficient documentation

## 2015-02-23 DIAGNOSIS — Z85048 Personal history of other malignant neoplasm of rectum, rectosigmoid junction, and anus: Secondary | ICD-10-CM | POA: Diagnosis not present

## 2015-02-23 DIAGNOSIS — I10 Essential (primary) hypertension: Secondary | ICD-10-CM | POA: Diagnosis not present

## 2015-02-23 DIAGNOSIS — E213 Hyperparathyroidism, unspecified: Secondary | ICD-10-CM | POA: Diagnosis not present

## 2015-02-23 DIAGNOSIS — D649 Anemia, unspecified: Secondary | ICD-10-CM | POA: Insufficient documentation

## 2015-02-23 DIAGNOSIS — C679 Malignant neoplasm of bladder, unspecified: Secondary | ICD-10-CM | POA: Insufficient documentation

## 2015-02-23 DIAGNOSIS — Z7982 Long term (current) use of aspirin: Secondary | ICD-10-CM | POA: Diagnosis not present

## 2015-02-23 HISTORY — PX: TRANSURETHRAL RESECTION OF BLADDER TUMOR: SHX2575

## 2015-02-23 HISTORY — PX: CYSTOSCOPY: SHX5120

## 2015-02-23 LAB — PROTIME-INR
INR: 1.21 (ref 0.00–1.49)
PROTHROMBIN TIME: 15.5 s — AB (ref 11.6–15.2)

## 2015-02-23 SURGERY — TURBT (TRANSURETHRAL RESECTION OF BLADDER TUMOR)
Anesthesia: General | Site: Bladder

## 2015-02-23 MED ORDER — LIDOCAINE HCL (CARDIAC) 20 MG/ML IV SOLN
INTRAVENOUS | Status: AC
  Filled 2015-02-23: qty 5

## 2015-02-23 MED ORDER — PROPOFOL 10 MG/ML IV BOLUS
INTRAVENOUS | Status: AC
  Filled 2015-02-23: qty 20

## 2015-02-23 MED ORDER — LACTATED RINGERS IV SOLN
INTRAVENOUS | Status: DC | PRN
  Administered 2015-02-23 (×2): via INTRAVENOUS

## 2015-02-23 MED ORDER — ONDANSETRON HCL 4 MG/2ML IJ SOLN
INTRAMUSCULAR | Status: AC
  Filled 2015-02-23: qty 2

## 2015-02-23 MED ORDER — FENTANYL CITRATE (PF) 100 MCG/2ML IJ SOLN
INTRAMUSCULAR | Status: AC
  Filled 2015-02-23: qty 2

## 2015-02-23 MED ORDER — STERILE WATER FOR IRRIGATION IR SOLN
Status: DC | PRN
Start: 2015-02-23 — End: 2015-02-23
  Administered 2015-02-23: 4000 mL

## 2015-02-23 MED ORDER — EPHEDRINE SULFATE 50 MG/ML IJ SOLN
INTRAMUSCULAR | Status: AC
  Filled 2015-02-23: qty 1

## 2015-02-23 MED ORDER — DEXAMETHASONE SODIUM PHOSPHATE 10 MG/ML IJ SOLN
INTRAMUSCULAR | Status: DC | PRN
  Administered 2015-02-23: 20 mg via INTRAVENOUS

## 2015-02-23 MED ORDER — STERILE WATER FOR IRRIGATION IR SOLN
Status: DC | PRN
  Administered 2015-02-23: 15000 mL

## 2015-02-23 MED ORDER — LIDOCAINE HCL (CARDIAC) 20 MG/ML IV SOLN
INTRAVENOUS | Status: DC | PRN
  Administered 2015-02-23: 75 mg via INTRAVENOUS

## 2015-02-23 MED ORDER — FENTANYL CITRATE (PF) 100 MCG/2ML IJ SOLN
INTRAMUSCULAR | Status: DC | PRN
  Administered 2015-02-23: 100 ug via INTRAVENOUS

## 2015-02-23 MED ORDER — MIDAZOLAM HCL 5 MG/5ML IJ SOLN
INTRAMUSCULAR | Status: DC | PRN
  Administered 2015-02-23 (×2): 1 mg via INTRAVENOUS

## 2015-02-23 MED ORDER — ONDANSETRON HCL 4 MG/2ML IJ SOLN
INTRAMUSCULAR | Status: DC | PRN
  Administered 2015-02-23: 4 mg via INTRAVENOUS

## 2015-02-23 MED ORDER — SODIUM CHLORIDE 0.9 % IJ SOLN
INTRAMUSCULAR | Status: AC
  Filled 2015-02-23: qty 10

## 2015-02-23 MED ORDER — METOCLOPRAMIDE HCL 5 MG/ML IJ SOLN
INTRAMUSCULAR | Status: DC | PRN
  Administered 2015-02-23: 5 mg via INTRAVENOUS

## 2015-02-23 MED ORDER — MIDAZOLAM HCL 2 MG/2ML IJ SOLN
INTRAMUSCULAR | Status: AC
  Filled 2015-02-23: qty 2

## 2015-02-23 MED ORDER — SODIUM CHLORIDE 0.9 % IR SOLN
Status: DC | PRN
  Administered 2015-02-23: 3000 mL

## 2015-02-23 MED ORDER — PROPOFOL 10 MG/ML IV BOLUS
INTRAVENOUS | Status: DC | PRN
  Administered 2015-02-23: 200 mg via INTRAVENOUS
  Administered 2015-02-23 (×2): 50 mg via INTRAVENOUS
  Administered 2015-02-23: 100 mg via INTRAVENOUS
  Administered 2015-02-23: 50 mg via INTRAVENOUS

## 2015-02-23 SURGICAL SUPPLY — 23 items
BAG URINE DRAINAGE (UROLOGICAL SUPPLIES) ×2 IMPLANT
BAG URO CATCHER STRL LF (DRAPE) ×3 IMPLANT
CATH FOLEY 2WAY SLVR  5CC 18FR (CATHETERS) ×2
CATH FOLEY 2WAY SLVR 5CC 18FR (CATHETERS) IMPLANT
CATH ROBINSON RED A/P 16FR (CATHETERS) IMPLANT
CLOTH BEACON ORANGE TIMEOUT ST (SAFETY) ×3 IMPLANT
DRAPE CAMERA CLOSED 9X96 (DRAPES) ×3 IMPLANT
ELECT REM PT RETURN 9FT ADLT (ELECTROSURGICAL) ×3
ELECTRODE REM PT RTRN 9FT ADLT (ELECTROSURGICAL) ×1 IMPLANT
GLOVE BIOGEL M 7.0 STRL (GLOVE) ×3 IMPLANT
GOWN STRL REUS W/ TWL XL LVL3 (GOWN DISPOSABLE) ×1 IMPLANT
GOWN STRL REUS W/TWL LRG LVL3 (GOWN DISPOSABLE) ×6 IMPLANT
GOWN STRL REUS W/TWL XL LVL3 (GOWN DISPOSABLE) ×3
HOVERMATT HALF SINGLE USE (PATIENT TRANSFER) ×2 IMPLANT
KIT ASPIRATION TUBING (SET/KITS/TRAYS/PACK) ×2 IMPLANT
LOOP CUT BIPOLAR 24F LRG (ELECTROSURGICAL) ×2 IMPLANT
MANIFOLD NEPTUNE II (INSTRUMENTS) ×3 IMPLANT
MARKER SKIN DUAL TIP RULER LAB (MISCELLANEOUS) ×3 IMPLANT
NS IRRIG 1000ML POUR BTL (IV SOLUTION) ×3 IMPLANT
PACK CYSTO (CUSTOM PROCEDURE TRAY) ×3 IMPLANT
SYRINGE IRR TOOMEY STRL 70CC (SYRINGE) ×2 IMPLANT
TUBING CONNECTING 10 (TUBING) ×2 IMPLANT
TUBING CONNECTING 10' (TUBING) ×1

## 2015-02-23 NOTE — Transfer of Care (Signed)
Immediate Anesthesia Transfer of Care Note  Patient: Richard Warner  Procedure(s) Performed: Procedure(s): TRANSURETHRAL RESECTION OF BLADDER MASS (N/A) CYSTOSCOPY (N/A)  Patient Location: PACU  Anesthesia Type:General  Level of Consciousness: awake, oriented, patient cooperative, lethargic and responds to stimulation  Airway & Oxygen Therapy: Patient Spontanous Breathing and Patient connected to face mask oxygen  Post-op Assessment: Report given to RN, Post -op Vital signs reviewed and stable and Patient moving all extremities  Post vital signs: Reviewed and stable  Last Vitals:  Filed Vitals:   02/23/15 0620  BP: 154/66  Pulse: 65  Temp: 36.5 C  Resp: 18    Complications: No apparent anesthesia complications

## 2015-02-23 NOTE — Anesthesia Procedure Notes (Signed)
Procedure Name: Intubation Date/Time: 02/23/2015 8:39 AM Performed by: Ofilia Neas Pre-anesthesia Checklist: Patient identified, Emergency Drugs available, Suction available, Patient being monitored and Timeout performed Patient Re-evaluated:Patient Re-evaluated prior to inductionOxygen Delivery Method: Circle system utilized Preoxygenation: Pre-oxygenation with 100% oxygen (8 min preo2) Intubation Type: IV induction Ventilation: Mask ventilation without difficulty Laryngoscope Size: Glidescope and 4 Grade View: Grade IV Tube type: Oral Tube size: 7.5 mm Number of attempts: 3 Airway Equipment and Method: Video-laryngoscopy Placement Confirmation: ETT inserted through vocal cords under direct vision,  positive ETCO2 and breath sounds checked- equal and bilateral Secured at: 22 cm Tube secured with: Tape Dental Injury: Injury to lip  Difficulty Due To: Difficulty was anticipated, Difficult Airway- due to large tongue, Difficult Airway- due to reduced neck mobility, Difficult Airway- due to dentition, Difficult Airway- due to limited oral opening, Difficult Airway- due to anterior larynx and Difficult Airway- due to immobile epiglottis Future Recommendations: Recommend- induction with short-acting agent, and alternative techniques readily available Comments: Elective glidescope due to past intubation hx.  Head on one regular pillow and donut.

## 2015-02-23 NOTE — Anesthesia Postprocedure Evaluation (Signed)
  Anesthesia Post-op Note  Patient: Richard Warner  Procedure(s) Performed: Procedure(s) (LRB): TRANSURETHRAL RESECTION OF BLADDER MASS (N/A) CYSTOSCOPY (N/A)  Patient Location: PACU  Anesthesia Type: General  Level of Consciousness: awake and alert   Airway and Oxygen Therapy: Patient Spontanous Breathing  Post-op Pain: mild  Post-op Assessment: Post-op Vital signs reviewed, Patient's Cardiovascular Status Stable, Respiratory Function Stable, Patent Airway and No signs of Nausea or vomiting  Last Vitals:  Filed Vitals:   02/23/15 1319  BP: 159/66  Pulse: 68  Temp:   Resp: 18    Post-op Vital Signs: stable   Complications: No apparent anesthesia complications

## 2015-02-23 NOTE — Progress Notes (Signed)
Up and walked in hall. Tol well.  Cath draining light red urine with occ small clot

## 2015-02-23 NOTE — Discharge Instructions (Signed)
Home with Foley Catheter   General Anesthesia, Care After Refer to this sheet in the next few weeks. These instructions provide you with information on caring for yourself after your procedure. Your health care provider may also give you more specific instructions. Your treatment has been planned according to current medical practices, but problems sometimes occur. Call your health care provider if you have any problems or questions after your procedure. WHAT TO EXPECT AFTER THE PROCEDURE After the procedure, it is typical to experience:  Sleepiness.  Nausea and vomiting. HOME CARE INSTRUCTIONS  For the first 24 hours after general anesthesia:  Have a responsible person with you.  Do not drive a car. If you are alone, do not take public transportation.  Do not drink alcohol.  Do not take medicine that has not been prescribed by your health care provider.  Do not sign important papers or make important decisions.  You may resume a normal diet and activities as directed by your health care provider.  Change bandages (dressings) as directed.  If you have questions or problems that seem related to general anesthesia, call the hospital and ask for the anesthetist or anesthesiologist on call. SEEK MEDICAL CARE IF:  You have nausea and vomiting that continue the day after anesthesia.  You develop a rash. SEEK IMMEDIATE MEDICAL CARE IF:   You have difficulty breathing.  You have chest pain.  You have any allergic problems. Document Released: 10/29/2000 Document Revised: 07/28/2013 Document Reviewed: 02/05/2013 [facility] Patient Information 2015 [place], [place]. This information is not intended to replace advice given to you by your health care provider. Make sure you discuss any questions you have with your health care provider.     Indwelling Urinary Catheter Care You have been given a flexible tube (catheter) used to drain the bladder. Catheters are often used when a  person has difficulty urinating due to blockage, bleeding, infection, or inability to control bladder or bowel movements (incontinence). A catheter requires daily care to prevent infection and blockage. HOME CARE INSTRUCTIONS  Do the following to reduce the risk of infection. Antibiotic medicines cannot prevent infections. Limit the number of bacteria entering your bladder  Wash your hands for 2 minutes with soapy water before and after handling the catheter.  Wash your bottom and the entire catheter twice daily, as well as after each bowel movement. Wash the tip of the penis or just above the vaginal opening with soap and warm water, rinse, and then wash the rectal area. Always wash from front to back.  When changing from the leg bag to overnight bag or from the overnight bag to leg bag, thoroughly clean the end of the catheter where it connects to the tubing with an alcohol wipe.  Clean the leg bag and overnight bag daily after use. Replace your drainage bags weekly.  Always keep the tubing and bag below the level of your bladder. This allows your urine to drain properly. Lifting the bag or tubing above the level of your bladder will cause dirty urine to flow back into your bladder. If you must briefly lift the bag higher than your bladder, pinch the catheter or tubing to prevent backflow.  Drink enough water and fluids to keep your urine clear or pale yellow, or as directed by your caregiver. This will flush bacteria out of the bladder. Protect tissues from injury  Attach the catheter to your leg so there is no tension on the catheter. Use adhesive tape or a leg strap.  If you are using adhesive tape, remove any sticky residue left behind by the previous tape you used.  Place your leg bag on your lower leg. Fasten the straps securely and comfortably.  Do not remove the catheter yourself unless you have been instructed how to do so. Keep the urinary pathway open  Check throughout the day to  be sure your catheter is working and urine is draining freely. Make sure the tubing does not become kinked.  Do not let the drainage bag overfill. SEEK IMMEDIATE MEDICAL CARE IF:   The catheter becomes blocked. Urine is not draining.  Urine is leaking.  You have any pain.  You have a fever. Document Released: 07/23/2005 Document Revised: 07/09/2012 Document Reviewed: 12/22/2009 [facility] Patient Information 2015 [place], [place]. This information is not intended to replace advice given to you by your health care provider. Make sure you discuss any questions you have with your health care provider.  Post Anesthesia Home Care Instructions  Activity: Get plenty of rest for the remainder of the day. A responsible adult should stay with you for 24 hours following the procedure.  For the next 24 hours, DO NOT: -Drive a car -Paediatric nurse -Drink alcoholic beverages -Take any medication unless instructed by your physician -Make any legal decisions or sign important papers.  Meals: Start with liquid foods such as gelatin or soup. Progress to regular foods as tolerated. Avoid greasy, spicy, heavy foods. If nausea and/or vomiting occur, drink only clear liquids until the nausea and/or vomiting subsides. Call your physician if vomiting continues.  Special Instructions/Symptoms: Your throat may feel dry or sore from the anesthesia or the breathing tube placed in your throat during surgery. If this causes discomfort, gargle with warm salt water. The discomfort should disappear within 24 hours.  If you had a scopolamine patch placed behind your ear for the management of post- operative nausea and/or vomiting:  1. The medication in the patch is effective for 72 hours, after which it should be removed.  Wrap patch in a tissue and discard in the trash. Wash hands thoroughly with soap and water. 2. You may remove the patch earlier than 72 hours if you experience unpleasant side effects which may  include dry mouth, dizziness or visual disturbances. 3. Avoid touching the patch. Wash your hands with soap and water after contact with the patch.

## 2015-02-23 NOTE — Op Note (Signed)
Richard Warner is a 64 y.o.   02/23/2015  General  Preop diagnosis: Bladder tumor  Postop diagnosis: Same  Procedure done: Cystoscopy, TUR bladder tumor  Surgeons: Charlene Brooke. Ximenna Fonseca and Dr. Rana Snare  Anesthesia: Gen.  Indication: Patient is a 64 years old male with a history of resection rectal cancer in 2013 treated with  radiation and chemotherapy.  His CEA has been rising. PET scan in November 2015 showed  thickened bladder wall. Cystoscopy showed a sessile mass in the anterior wall of the bladder. Biopsy of the bladder mass revealed chronic inflammation. His CEA continued to go up. Recent biopsy of the rectal anastomosis was negative for malignancy. He continued to have  gross hematuria. Repeat cystoscopy showed a mass in the bladder that measured about 3 cm and appeared to have increased in size. He needs a repeat cystoscopy and TUR bladder mass. Recent PET scan showed no renal mass. Because the previous biopsy was difficult I asked  Dr. Risa Grill for his assistance. He is scheduled today for the procedure.  Procedure: Patient was identified by his wrist band and proper timeout was taken.  Under general anesthesia he was prepped and draped and placed in the dorsolithotomy position. A panendoscope was inserted in the bladder. The anterior urethra is normal. He has trilobar prostatic hypertrophy. There is a 3 cm sessile tumor on the anterior wall of the bladder near the dome. The tumor does not appear to be papillary. The cystoscope was removed. The meatus was dilated with Von Buren sounds. Then a #24 resectoscope was inserted in the bladder. Once again it was difficult to identify the tumor in the bladder. But with Dr. Cy Blamer assistance I was able to visualize the tumor. Resection of the bladder tumor was done. The bladder was irrigated with normal saline and the specimen were irrigated out of the bladder. The specimen were sent to pathology for definitive diagnosis. The area of resection of  the bladder tumor was fulgurated with electrocautery. There was no evidence of bleeding at the end of the procedure. The bladder was again irrigated with normal saline and the returns were clear. The resectoscope was then removed. A #18 French Foley catheter was then inserted in the bladder.  The patient tolerated the procedure well and left the OR in satisfactory condition to postanesthesia care unit.  EBL: Minimal  CC: Julieanne Manson, M.D.

## 2015-02-23 NOTE — Progress Notes (Signed)
Patient does not want to wear leg bag.  Sent home with large bag and instructions given on care and how to empty it.

## 2015-02-24 ENCOUNTER — Other Ambulatory Visit: Payer: Self-pay | Admitting: Nurse Practitioner

## 2015-02-24 ENCOUNTER — Encounter (HOSPITAL_COMMUNITY): Payer: Self-pay | Admitting: Urology

## 2015-02-25 ENCOUNTER — Telehealth: Payer: Self-pay | Admitting: Oncology

## 2015-02-25 ENCOUNTER — Telehealth: Payer: Self-pay | Admitting: *Deleted

## 2015-02-25 NOTE — Telephone Encounter (Signed)
PT.'S APPOINTMENTS ARE ON 03/01/15 1:45PM LAB AND SEE LISA THOMAS,NP AT 2:15PM. HE VOICES UNDERSTANDING.

## 2015-02-25 NOTE — Telephone Encounter (Signed)
lvm for pt regarding to July appt. °

## 2015-02-27 ENCOUNTER — Encounter (HOSPITAL_COMMUNITY)
Admission: EM | Disposition: A | Discharge status: Home / Self Care | Payer: Medicare Other | Source: Home / Self Care | Attending: Cardiovascular Disease

## 2015-02-27 ENCOUNTER — Ambulatory Visit (HOSPITAL_COMMUNITY): Admit: 2015-02-27 | Payer: Self-pay | Admitting: Cardiovascular Disease

## 2015-02-27 ENCOUNTER — Inpatient Hospital Stay (HOSPITAL_COMMUNITY)
Admission: EM | Admit: 2015-02-27 | Discharge: 2015-03-02 | DRG: 247 | Disposition: A | Discharge status: Home / Self Care | Payer: Medicare Other | Attending: Cardiovascular Disease | Admitting: Cardiovascular Disease

## 2015-02-27 ENCOUNTER — Emergency Department (HOSPITAL_COMMUNITY): Payer: Medicare Other

## 2015-02-27 ENCOUNTER — Encounter (HOSPITAL_COMMUNITY): Payer: Self-pay

## 2015-02-27 DIAGNOSIS — E785 Hyperlipidemia, unspecified: Secondary | ICD-10-CM | POA: Diagnosis present

## 2015-02-27 DIAGNOSIS — D500 Iron deficiency anemia secondary to blood loss (chronic): Secondary | ICD-10-CM | POA: Diagnosis not present

## 2015-02-27 DIAGNOSIS — Z85048 Personal history of other malignant neoplasm of rectum, rectosigmoid junction, and anus: Secondary | ICD-10-CM

## 2015-02-27 DIAGNOSIS — D649 Anemia, unspecified: Secondary | ICD-10-CM | POA: Diagnosis present

## 2015-02-27 DIAGNOSIS — Z955 Presence of coronary angioplasty implant and graft: Secondary | ICD-10-CM

## 2015-02-27 DIAGNOSIS — I2129 ST elevation (STEMI) myocardial infarction involving other sites: Secondary | ICD-10-CM | POA: Diagnosis present

## 2015-02-27 DIAGNOSIS — R319 Hematuria, unspecified: Secondary | ICD-10-CM

## 2015-02-27 DIAGNOSIS — Z6841 Body Mass Index (BMI) 40.0 and over, adult: Secondary | ICD-10-CM | POA: Diagnosis not present

## 2015-02-27 DIAGNOSIS — N182 Chronic kidney disease, stage 2 (mild): Secondary | ICD-10-CM | POA: Diagnosis present

## 2015-02-27 DIAGNOSIS — Z79899 Other long term (current) drug therapy: Secondary | ICD-10-CM

## 2015-02-27 DIAGNOSIS — E039 Hypothyroidism, unspecified: Secondary | ICD-10-CM | POA: Diagnosis present

## 2015-02-27 DIAGNOSIS — Z9221 Personal history of antineoplastic chemotherapy: Secondary | ICD-10-CM

## 2015-02-27 DIAGNOSIS — Z923 Personal history of irradiation: Secondary | ICD-10-CM

## 2015-02-27 DIAGNOSIS — C7911 Secondary malignant neoplasm of bladder: Secondary | ICD-10-CM | POA: Diagnosis present

## 2015-02-27 DIAGNOSIS — I214 Non-ST elevation (NSTEMI) myocardial infarction: Secondary | ICD-10-CM

## 2015-02-27 DIAGNOSIS — Z86718 Personal history of other venous thrombosis and embolism: Secondary | ICD-10-CM | POA: Diagnosis not present

## 2015-02-27 DIAGNOSIS — E559 Vitamin D deficiency, unspecified: Secondary | ICD-10-CM | POA: Diagnosis present

## 2015-02-27 DIAGNOSIS — Z7982 Long term (current) use of aspirin: Secondary | ICD-10-CM | POA: Diagnosis not present

## 2015-02-27 DIAGNOSIS — K5900 Constipation, unspecified: Secondary | ICD-10-CM | POA: Diagnosis present

## 2015-02-27 DIAGNOSIS — I2510 Atherosclerotic heart disease of native coronary artery without angina pectoris: Secondary | ICD-10-CM | POA: Diagnosis present

## 2015-02-27 DIAGNOSIS — R310 Gross hematuria: Secondary | ICD-10-CM | POA: Diagnosis not present

## 2015-02-27 DIAGNOSIS — Z7901 Long term (current) use of anticoagulants: Secondary | ICD-10-CM | POA: Diagnosis not present

## 2015-02-27 DIAGNOSIS — I2582 Chronic total occlusion of coronary artery: Secondary | ICD-10-CM | POA: Diagnosis present

## 2015-02-27 DIAGNOSIS — R079 Chest pain, unspecified: Secondary | ICD-10-CM | POA: Diagnosis present

## 2015-02-27 DIAGNOSIS — I200 Unstable angina: Secondary | ICD-10-CM

## 2015-02-27 DIAGNOSIS — I129 Hypertensive chronic kidney disease with stage 1 through stage 4 chronic kidney disease, or unspecified chronic kidney disease: Secondary | ICD-10-CM | POA: Diagnosis present

## 2015-02-27 DIAGNOSIS — N2581 Secondary hyperparathyroidism of renal origin: Secondary | ICD-10-CM | POA: Diagnosis present

## 2015-02-27 DIAGNOSIS — I2119 ST elevation (STEMI) myocardial infarction involving other coronary artery of inferior wall: Secondary | ICD-10-CM | POA: Diagnosis present

## 2015-02-27 DIAGNOSIS — Z86711 Personal history of pulmonary embolism: Secondary | ICD-10-CM | POA: Diagnosis not present

## 2015-02-27 DIAGNOSIS — I10 Essential (primary) hypertension: Secondary | ICD-10-CM

## 2015-02-27 DIAGNOSIS — C20 Malignant neoplasm of rectum: Secondary | ICD-10-CM | POA: Diagnosis not present

## 2015-02-27 DIAGNOSIS — I213 ST elevation (STEMI) myocardial infarction of unspecified site: Secondary | ICD-10-CM | POA: Diagnosis not present

## 2015-02-27 DIAGNOSIS — I2111 ST elevation (STEMI) myocardial infarction involving right coronary artery: Secondary | ICD-10-CM

## 2015-02-27 DIAGNOSIS — Z85828 Personal history of other malignant neoplasm of skin: Secondary | ICD-10-CM

## 2015-02-27 HISTORY — DX: ST elevation (STEMI) myocardial infarction involving other sites: I21.29

## 2015-02-27 HISTORY — PX: CARDIAC CATHETERIZATION: SHX172

## 2015-02-27 LAB — BASIC METABOLIC PANEL
ANION GAP: 8 (ref 5–15)
BUN: 29 mg/dL — ABNORMAL HIGH (ref 6–20)
CO2: 24 mmol/L (ref 22–32)
Calcium: 8.6 mg/dL — ABNORMAL LOW (ref 8.9–10.3)
Chloride: 110 mmol/L (ref 101–111)
Creatinine, Ser: 1.61 mg/dL — ABNORMAL HIGH (ref 0.61–1.24)
GFR calc Af Amer: 51 mL/min — ABNORMAL LOW (ref >60)
GFR, EST NON AFRICAN AMERICAN: 44 mL/min — AB (ref >60)
GLUCOSE: 172 mg/dL — AB (ref 65–99)
Potassium: 4.3 mmol/L (ref 3.5–5.1)
SODIUM: 142 mmol/L (ref 135–145)

## 2015-02-27 LAB — CBC
HCT: 30.5 % — ABNORMAL LOW (ref 39.0–52.0)
HEMATOCRIT: 28.6 % — AB (ref 39.0–52.0)
HEMOGLOBIN: 9.1 g/dL — AB (ref 13.0–17.0)
Hemoglobin: 9.5 g/dL — ABNORMAL LOW (ref 13.0–17.0)
MCH: 25.7 pg — AB (ref 26.0–34.0)
MCH: 25.9 pg — AB (ref 26.0–34.0)
MCHC: 31.1 g/dL (ref 30.0–36.0)
MCHC: 31.8 g/dL (ref 30.0–36.0)
MCV: 82.4 fL (ref 78.0–100.0)
MCV: 81.5 fL (ref 78.0–100.0)
Platelets: 203 10*3/uL (ref 150–400)
Platelets: 192 10*3/uL (ref 150–400)
RBC: 3.7 MIL/uL — AB (ref 4.22–5.81)
RBC: 3.51 MIL/uL — ABNORMAL LOW (ref 4.22–5.81)
RDW: 14.2 % (ref 11.5–15.5)
RDW: 14.4 % (ref 11.5–15.5)
WBC: 6.3 10*3/uL (ref 4.0–10.5)
WBC: 6.8 10*3/uL (ref 4.0–10.5)

## 2015-02-27 LAB — MRSA PCR SCREENING: MRSA BY PCR: NEGATIVE

## 2015-02-27 LAB — BRAIN NATRIURETIC PEPTIDE: B NATRIURETIC PEPTIDE 5: 165.3 pg/mL — AB (ref 0.0–100.0)

## 2015-02-27 LAB — TROPONIN I
Troponin I: 0.14 ng/mL — ABNORMAL HIGH (ref <0.031)
Troponin I: >65 ng/mL (ref <0.031)

## 2015-02-27 LAB — I-STAT TROPONIN, ED: TROPONIN I, POC: 0.13 ng/mL — AB (ref 0.00–0.08)

## 2015-02-27 LAB — CREATININE, SERUM
Creatinine, Ser: 1.52 mg/dL — ABNORMAL HIGH (ref 0.61–1.24)
GFR calc Af Amer: 54 mL/min — ABNORMAL LOW (ref >60)
GFR calc non Af Amer: 47 mL/min — ABNORMAL LOW (ref >60)

## 2015-02-27 LAB — PROTIME-INR
INR: 1.17 (ref 0.00–1.49)
PROTHROMBIN TIME: 15 s (ref 11.6–15.2)

## 2015-02-27 LAB — POCT ACTIVATED CLOTTING TIME: Activated Clotting Time: 417 seconds

## 2015-02-27 SURGERY — CORONARY STENT INTERVENTION

## 2015-02-27 MED ORDER — CETYLPYRIDINIUM CHLORIDE 0.05 % MT LIQD
7.0000 mL | Freq: Two times a day (BID) | OROMUCOSAL | Status: DC
  Administered 2015-02-28 – 2015-03-02 (×5): 7 mL via OROMUCOSAL

## 2015-02-27 MED ORDER — CLOPIDOGREL BISULFATE 75 MG PO TABS
ORAL_TABLET | ORAL | Status: DC | PRN
  Administered 2015-02-27: 600 mg via ORAL

## 2015-02-27 MED ORDER — NITROGLYCERIN 0.4 MG SL SUBL: 0.4000 mg | SUBLINGUAL_TABLET | SUBLINGUAL | Status: DC | PRN

## 2015-02-27 MED ORDER — IOHEXOL 350 MG/ML SOLN
INTRAVENOUS | Status: DC | PRN
  Administered 2015-02-27: 145 mL via INTRAVENOUS

## 2015-02-27 MED ORDER — CLOPIDOGREL BISULFATE 75 MG PO TABS
75.0000 mg | ORAL_TABLET | Freq: Every day | ORAL | Status: DC
  Administered 2015-02-28 – 2015-03-02 (×3): 75 mg via ORAL
  Filled 2015-02-27 (×4): qty 1

## 2015-02-27 MED ORDER — VERAPAMIL HCL 2.5 MG/ML IV SOLN
INTRAVENOUS | Status: AC
  Filled 2015-02-27: qty 2

## 2015-02-27 MED ORDER — DIPHENHYDRAMINE HCL 50 MG/ML IJ SOLN
12.5000 mg | Freq: Once | INTRAMUSCULAR | Status: AC
  Administered 2015-02-27: 12.5 mg via INTRAVENOUS
  Filled 2015-02-27: qty 1

## 2015-02-27 MED ORDER — METOPROLOL TARTRATE 12.5 MG HALF TABLET
12.5000 mg | ORAL_TABLET | Freq: Two times a day (BID) | ORAL | Status: DC
  Administered 2015-02-27 – 2015-03-02 (×6): 12.5 mg via ORAL
  Filled 2015-02-27 (×7): qty 1

## 2015-02-27 MED ORDER — SODIUM CHLORIDE 0.9 % IV SOLN
1.7500 mg/kg/h | INTRAVENOUS | Status: AC
  Filled 2015-02-27: qty 250

## 2015-02-27 MED ORDER — MIDAZOLAM HCL 2 MG/2ML IJ SOLN
INTRAMUSCULAR | Status: DC | PRN
  Administered 2015-02-27: 2 mg via INTRAVENOUS

## 2015-02-27 MED ORDER — SODIUM CHLORIDE 0.9 % IV SOLN
250.0000 mg | INTRAVENOUS | Status: DC | PRN
  Administered 2015-02-27: 1.75 mg/kg/h via INTRAVENOUS

## 2015-02-27 MED ORDER — HEPARIN SODIUM (PORCINE) 1000 UNIT/ML IJ SOLN
INTRAMUSCULAR | Status: DC | PRN
  Administered 2015-02-27: 5000 [IU] via INTRAVENOUS

## 2015-02-27 MED ORDER — LEVOTHYROXINE SODIUM 88 MCG PO TABS
88.0000 ug | ORAL_TABLET | Freq: Every day | ORAL | Status: DC
  Administered 2015-02-28 – 2015-03-02 (×3): 88 ug via ORAL
  Filled 2015-02-27 (×4): qty 1

## 2015-02-27 MED ORDER — LIDOCAINE HCL (PF) 1 % IJ SOLN
INTRAMUSCULAR | Status: AC
  Filled 2015-02-27: qty 30

## 2015-02-27 MED ORDER — CLOPIDOGREL BISULFATE 300 MG PO TABS
ORAL_TABLET | ORAL | Status: AC
  Filled 2015-02-27: qty 2

## 2015-02-27 MED ORDER — METOCLOPRAMIDE HCL 5 MG/ML IJ SOLN
10.0000 mg | Freq: Once | INTRAMUSCULAR | Status: AC
  Administered 2015-02-27: 10 mg via INTRAVENOUS
  Filled 2015-02-27: qty 2

## 2015-02-27 MED ORDER — HEPARIN SODIUM (PORCINE) 5000 UNIT/ML IJ SOLN
5000.0000 [IU] | Freq: Three times a day (TID) | INTRAMUSCULAR | Status: DC
  Administered 2015-02-27 – 2015-03-02 (×8): 5000 [IU] via SUBCUTANEOUS
  Filled 2015-02-27 (×10): qty 1

## 2015-02-27 MED ORDER — ACETAMINOPHEN 325 MG PO TABS: 650.0000 mg | ORAL_TABLET | ORAL | Status: DC | PRN

## 2015-02-27 MED ORDER — ONDANSETRON HCL 4 MG/2ML IJ SOLN: 4.0000 mg | Freq: Four times a day (QID) | INTRAMUSCULAR | Status: DC | PRN

## 2015-02-27 MED ORDER — HEPARIN (PORCINE) IN NACL 100-0.45 UNIT/ML-% IJ SOLN
1550.0000 [IU]/h | INTRAMUSCULAR | Status: DC
  Administered 2015-02-27: 1550 [IU]/h via INTRAVENOUS
  Filled 2015-02-27 (×2): qty 250

## 2015-02-27 MED ORDER — DIPHENHYDRAMINE HCL 50 MG/ML IJ SOLN
INTRAMUSCULAR | Status: AC
  Filled 2015-02-27: qty 1

## 2015-02-27 MED ORDER — HEPARIN BOLUS VIA INFUSION
4000.0000 [IU] | Freq: Once | INTRAVENOUS | Status: AC
  Administered 2015-02-27: 4000 [IU] via INTRAVENOUS
  Filled 2015-02-27: qty 4000

## 2015-02-27 MED ORDER — HEPARIN (PORCINE) IN NACL 2-0.9 UNIT/ML-% IJ SOLN
INTRAMUSCULAR | Status: AC
  Filled 2015-02-27: qty 1500

## 2015-02-27 MED ORDER — ATORVASTATIN CALCIUM 80 MG PO TABS
80.0000 mg | ORAL_TABLET | Freq: Every day | ORAL | Status: DC
  Filled 2015-02-27: qty 1

## 2015-02-27 MED ORDER — WARFARIN SODIUM 5 MG PO TABS
5.0000 mg | ORAL_TABLET | Freq: Once | ORAL | Status: AC
  Administered 2015-02-27: 5 mg via ORAL
  Filled 2015-02-27: qty 1

## 2015-02-27 MED ORDER — NITROGLYCERIN 1 MG/10 ML FOR IR/CATH LAB
INTRA_ARTERIAL | Status: AC
  Filled 2015-02-27: qty 10

## 2015-02-27 MED ORDER — SODIUM CHLORIDE 0.9 % IV SOLN: 250.0000 mL | INTRAVENOUS | Status: DC | PRN

## 2015-02-27 MED ORDER — HEPARIN SODIUM (PORCINE) 1000 UNIT/ML IJ SOLN
INTRAMUSCULAR | Status: AC
  Filled 2015-02-27: qty 1

## 2015-02-27 MED ORDER — SODIUM CHLORIDE 0.9 % IJ SOLN
3.0000 mL | Freq: Two times a day (BID) | INTRAMUSCULAR | Status: DC
  Administered 2015-02-28: 3 mL via INTRAVENOUS

## 2015-02-27 MED ORDER — ASPIRIN EC 81 MG PO TBEC
81.0000 mg | DELAYED_RELEASE_TABLET | Freq: Every morning | ORAL | Status: DC
  Administered 2015-02-28 – 2015-03-02 (×3): 81 mg via ORAL
  Filled 2015-02-27 (×3): qty 1

## 2015-02-27 MED ORDER — BIVALIRUDIN BOLUS VIA INFUSION - CUPID
INTRAVENOUS | Status: DC | PRN
  Administered 2015-02-27: 124.5 mg via INTRAVENOUS

## 2015-02-27 MED ORDER — FENTANYL CITRATE (PF) 100 MCG/2ML IJ SOLN
INTRAMUSCULAR | Status: DC | PRN
  Administered 2015-02-27: 25 ug via INTRAVENOUS

## 2015-02-27 MED ORDER — BIVALIRUDIN 250 MG IV SOLR
INTRAVENOUS | Status: AC
  Filled 2015-02-27: qty 250

## 2015-02-27 MED ORDER — VERAPAMIL HCL 2.5 MG/ML IV SOLN
INTRAVENOUS | Status: DC | PRN
  Administered 2015-02-27: 14:00:00 via INTRA_ARTERIAL

## 2015-02-27 MED ORDER — MIDAZOLAM HCL 2 MG/2ML IJ SOLN
INTRAMUSCULAR | Status: AC
  Filled 2015-02-27: qty 2

## 2015-02-27 MED ORDER — DIPHENHYDRAMINE HCL 50 MG/ML IJ SOLN
INTRAMUSCULAR | Status: DC | PRN
  Administered 2015-02-27: 50 mg via INTRAVENOUS

## 2015-02-27 MED ORDER — OXYCODONE-ACETAMINOPHEN 5-325 MG PO TABS: 1.0000 | ORAL_TABLET | ORAL | Status: DC | PRN

## 2015-02-27 MED ORDER — NITROGLYCERIN IN D5W 200-5 MCG/ML-% IV SOLN
10.0000 ug/min | INTRAVENOUS | Status: DC
  Administered 2015-02-27: 10 ug/min via INTRAVENOUS
  Filled 2015-02-27: qty 250

## 2015-02-27 MED ORDER — SODIUM CHLORIDE 0.9 % IJ SOLN: 3.0000 mL | INTRAMUSCULAR | Status: DC | PRN

## 2015-02-27 MED ORDER — WARFARIN - PHARMACIST DOSING INPATIENT
Freq: Every day | Status: DC
  Administered 2015-02-28: 18:00:00

## 2015-02-27 MED ORDER — MORPHINE SULFATE 2 MG/ML IJ SOLN: 2.0000 mg | INTRAMUSCULAR | Status: DC | PRN

## 2015-02-27 MED ORDER — SODIUM CHLORIDE 0.9 % IV SOLN: INTRAVENOUS | Status: AC

## 2015-02-27 MED ORDER — FENTANYL CITRATE (PF) 100 MCG/2ML IJ SOLN
INTRAMUSCULAR | Status: AC
  Filled 2015-02-27: qty 2

## 2015-02-27 MED ORDER — EZETIMIBE 10 MG PO TABS
10.0000 mg | ORAL_TABLET | Freq: Every day | ORAL | Status: DC
  Administered 2015-02-27 – 2015-03-01 (×3): 10 mg via ORAL
  Filled 2015-02-27 (×4): qty 1

## 2015-02-27 MED ORDER — ATORVASTATIN CALCIUM 80 MG PO TABS
80.0000 mg | ORAL_TABLET | Freq: Every day | ORAL | Status: DC
  Administered 2015-02-28 – 2015-03-01 (×2): 80 mg via ORAL
  Filled 2015-02-27 (×3): qty 1

## 2015-02-27 MED ORDER — DOXAZOSIN MESYLATE 2 MG PO TABS
2.0000 mg | ORAL_TABLET | Freq: Every day | ORAL | Status: DC
  Administered 2015-02-27 – 2015-03-01 (×3): 2 mg via ORAL
  Filled 2015-02-27 (×4): qty 1

## 2015-02-27 SURGICAL SUPPLY — 27 items
BALLN TREK RX 2.5X15 (BALLOONS) ×3
BALLN ~~LOC~~ EMERGE MR 3.25X15 (BALLOONS) ×3
BALLOON TREK RX 2.5X15 (BALLOONS) IMPLANT
BALLOON ~~LOC~~ EMERGE MR 3.25X15 (BALLOONS) IMPLANT
BRACE RADIAL COMPRESSION RADST (HEMOSTASIS) ×2 IMPLANT
CATH EXTRAC PRONTO 5.5F 138CM (CATHETERS) ×2 IMPLANT
CATH HEARTRAIL 6F IL3.5 (CATHETERS) ×2 IMPLANT
CATH INFINITI 5 FR JL3.5 (CATHETERS) ×3 IMPLANT
CATH INFINITI 5FR ANG PIGTAIL (CATHETERS) ×3 IMPLANT
CATH INFINITI 5FR MULTPACK ANG (CATHETERS) IMPLANT
CATH INFINITI JR4 5F (CATHETERS) ×3 IMPLANT
CATH VISTA GUIDE 6FR XBLAD3.5 (CATHETERS) ×2 IMPLANT
DEVICE RAD COMP TR BAND LRG (VASCULAR PRODUCTS) ×3 IMPLANT
ELECT DEFIB PAD ADLT CADENCE (PAD) ×2 IMPLANT
GLIDESHEATH SLEND SS 6F .021 (SHEATH) ×3 IMPLANT
HOVERMATT SINGLE USE (MISCELLANEOUS) ×2 IMPLANT
KIT ENCORE 26 ADVANTAGE (KITS) ×2 IMPLANT
KIT HEART LEFT (KITS) ×3 IMPLANT
PACK CARDIAC CATHETERIZATION (CUSTOM PROCEDURE TRAY) ×3 IMPLANT
SHEATH PINNACLE 5F 10CM (SHEATH) IMPLANT
STENT SYNERGY DES 3X24 (Permanent Stent) ×2 IMPLANT
SYR MEDRAD MARK V 150ML (SYRINGE) ×3 IMPLANT
TRANSDUCER W/STOPCOCK (MISCELLANEOUS) ×3 IMPLANT
TUBING CIL FLEX 10 FLL-RA (TUBING) ×3 IMPLANT
WIRE COUGAR XT STRL 300CM (WIRE) ×2 IMPLANT
WIRE EMERALD 3MM-J .035X150CM (WIRE) IMPLANT
WIRE SAFE-T 1.5MM-J .035X260CM (WIRE) ×3 IMPLANT

## 2015-02-27 NOTE — Progress Notes (Signed)
Chaplain responded to code stemi for cath lab emergency.  Patient and sister were present in room.  Patient asked for prayer.  Chaplain prayed.  Rev. [place], [place]

## 2015-02-27 NOTE — ED Notes (Signed)
Troponin 0.13 Dr. Darl Householder made aware. Repeat EKG performed

## 2015-02-27 NOTE — Progress Notes (Signed)
When RadStat band was moved to the 6th notch, the pressure device was no longer touching pt's skin. At this point, RN applied a pressure dressing to the R radial site. Site is level 0. RN reapplied the Radstat to pt's wrist to prevent him from bending and to be a reminder of not using this wrist. Will continue to monitor pt.

## 2015-02-27 NOTE — H&P (Signed)
ADMISSION HISTORY AND PHYSICAL   Date: 02/27/2015               Patient Name:  Richard Warner MRN: [id number]  DOB: 1951-02-01 Age / Sex: 64 y.o., male        PCP: [facility] Primary Cardiologist: Stanford Breed         History of Present Illness: Patient is a 64 y.o. male with a PMHx of morbid obesity, hypertension, pulmonary embolus, rectal cancer, who was admitted to [facility] on 02/27/2015 for evaluation of ongoing chest discomfort, headache, and signs of an ST segment elevation myocardial infarction..   The patient has a history of pulmonary emboli. He recently stopped his Coumadin because of a procedure. He started his Coumadin back last night but his INR is still normal.  He woke this morning with severe headache, and chest discomfort. It was described as a heaviness that radiated out to his left arm. It was not pleuritic. Does not get any regular exercise. He is retired. 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  Has a history of hyperlipidemia.  The pain is now been going on for 3 or 4 hours. He's been relieved slightly with IV nitroglycerin and IV heparin. He's also received some IV morphine.  His troponins are mildly elevated.  His EKG revealed very mild ST segment elevation in the inferior and lateral leads. He also has ST segment depression in leads V1 and V2. All of these EKG changes are new.   Medications: Outpatient medications:  (Not in a hospital admission)  No Known Allergies   Past Medical History  Diagnosis Date  . Hypertension   . Pulmonary embolism 12-03-11    01-17-10 s/p DVT left leg -Coumadin tx.until 08-28-11  . Hyperlipidemia   . Leg swelling 12-03-11    bilateral if up most of day,equally swells, up to knee level, improved now  . Fractures 12-03-11    s/p right ankle and right wrist- no problems now  . Hypothyroidism   . Dyslipidemia   . Anemia 12-03-11    tx. oral iron supplement  . Arthritis 12-03-11    mild lower back  . 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 Chronic kidney disease 12-03-11    kidney stone x1 ,none recent stage III  . Secondary hyperparathyroidism, renal   . Chronic anticoagulation   . Shortness of breath 12-03-11    hx. 6'11- during pulmonary emboli episode, none now, extreme exertion only.  . Morbid obesity   . Vitamin D deficiency   . Chronic pulmonary edema   . Dorsalgia     Past Surgical History  Procedure Laterality Date  . Colonoscopy  10/08/2011    Procedure: COLONOSCOPY;  Surgeon: Inda Castle, MD;  Location: [facility];  Service: Endoscopy;  Laterality: N/A;  . Tonsillectomy  12-03-11    child  . Proctoscopy  12/14/2011    Procedure: PROCTOSCOPY;  Surgeon: [name], MD;  Location: [facility] ORS;  Service: General;  Laterality: N/A;  . Low anterior bowel resection  12/14/2011    Mid/proximal rectal cancer. T3 N0  . Colonoscopy N/A 05/15/2013    Procedure: COLONOSCOPY;  Surgeon: Inda Castle, MD;  Location: [facility];  Service: Endoscopy;  Laterality: N/A;  . Colonoscopy N/A 07/27/2014    Procedure: COLONOSCOPY;  Surgeon: Inda Castle,  MD;  Location: [facility];  Service: Endoscopy;  Laterality: N/A;  . Cyst removal trunk N/A 09/17/2014    Procedure: REMOVAL OF ANTERIOR CHEST WALL SKIN MASS;  Surgeon: Michael Boston, MD;  Location: [facility];  Service: General;  Laterality: N/A;  . Cystoscopy with biopsy N/A 12/03/2014    Procedure: CYSTOSCOPY WITH BIOPSY;  Surgeon: Lowella Bandy, MD;  Location: [facility];  Service: Urology;  Laterality: N/A;  . Eus N/A 01/06/2015    Procedure: LOWER ENDOSCOPIC ULTRASOUND (EUS);  Surgeon: [name], MD;  Location: [facility];  Service: Endoscopy;  Laterality: N/A;  . Transurethral resection of bladder tumor N/A 02/23/2015    Procedure: TRANSURETHRAL RESECTION OF BLADDER  MASS;  Surgeon: Lowella Bandy, MD;  Location: [facility];  Service: Urology;  Laterality: N/A;  . Cystoscopy N/A 02/23/2015    Procedure: CYSTOSCOPY;  Surgeon: Lowella Bandy, MD;  Location: [facility];  Service: Urology;  Laterality: N/A;    Family History  Problem Relation Age of Onset  . Diabetes Father   . Heart disease Father   . Stroke Maternal Grandmother   . Stroke Maternal Grandfather   . Hypertension Paternal Grandfather   . Lung cancer Paternal Grandfather   . Cancer Paternal Grandfather     Social History:  reports that he has never smoked. He has never used smokeless tobacco. He reports that he does not drink alcohol or use illicit drugs.   Review of Systems: Constitutional:  denies fever, chills, diaphoresis, appetite change and fatigue.  HEENT: denies photophobia, eye pain, redness, hearing loss, ear pain, congestion, sore throat, rhinorrhea, sneezing, neck pain, neck stiffness and tinnitus.  Respiratory: denies SOB, DOE, cough, chest tightness, and wheezing.  Cardiovascular: admits to chest pain,  leg swelling.  Gastrointestinal: denies nausea, vomiting, abdominal pain, diarrhea, constipation, blood in stool.  Genitourinary: denies dysuria, urgency, frequency, hematuria, flank pain and difficulty urinating.  Musculoskeletal: denies  myalgias, back pain, joint swelling, arthralgias and gait problem.   Skin: denies pallor, rash and wound.  Neurological: denies dizziness, seizures, syncope, weakness, light-headedness, numbness and headaches.   Hematological: denies adenopathy, easy bruising, personal or family bleeding history.  Psychiatric/ Behavioral: denies suicidal ideation, mood changes, confusion, nervousness, sleep disturbance and agitation.    Physical Exam: BP 112/43 mmHg  Pulse 55  Temp(Src) 98.4 F (36.9 C)  Resp 12  SpO2 100%  Wt Readings from Last 3 Encounters:  02/23/15 166.017 kg (366 lb)  02/22/15 166.017 kg (366 lb)  02/17/15 167.831 kg (370 lb)    General:  Vital signs reviewed and noted. He has morbid obesity.   Head: Normocephalic, atraumatic, sclera anicteric, mucus membranes are moist   Neck: Supple. Negative for carotid bruits. JVD not elevated.   Lungs:  Clear bilaterally to auscultation without wheezes, rales, or rhonchi. Breathing is normal   Heart: RRR with S1 S2. No murmurs, rubs, or gallops.   Abdomen:  Soft, non-tender, non-distended with normoactive bowel sounds. No hepatomegaly. No rebound/guarding. No obvious abdominal masses   MSK: Strength and the appear normal for age.   Extremities: No clubbing or cyanosis. He has 2-3+ leg edema. He has extensive chronic stasis changes. His skin is quite scaly and discolored.  Right radial pulses 2+ and is normal.   Neurologic: Alert and oriented X 3. Moves all extremities spontaneously   Psych:  normal     Lab results: Basic Metabolic Panel:  Recent Labs Lab 02/27/15 0945  NA 142  K 4.3  CL 110  CO2 24  GLUCOSE 172*  BUN 29*  CREATININE 1.61*  CALCIUM 8.6*    Liver Function Tests: No results for input(s): AST, ALT, ALKPHOS, BILITOT, PROT, ALBUMIN in the last 168 hours. No results for input(s): LIPASE, AMYLASE in the last 168 hours.  CBC:  Recent Labs Lab 02/27/15 0945  WBC 6.3  HGB 9.5*  HCT 30.5*  MCV 82.4  PLT 203    Cardiac Enzymes:  Recent Labs Lab 02/27/15 0945  TROPONINI 0.14*    BNP: Invalid input(s): POCBNP  CBG: No results for input(s): GLUCAP in the last 168 hours.  Coagulation Studies:  Recent Labs  02/27/15 0945  LABPROT 15.0  INR 1.17     Other results:  EKG":  I reviewed all of today's ECGs as well as old ECGs. He has normal sinus rhythm. He has mild ST segment depression in leads V1 and V2. He has mild ST segment elevation in the inferior lateral leads.   Imaging: Dg Chest 2 View  02/27/2015   CLINICAL DATA:  Chest pain and shortness of breath. History of rectal carcinoma.  EXAM: CHEST - 2 VIEW  COMPARISON:  09/13/2014   FINDINGS: Stable cardiac enlargement. Lung volumes are low with bibasilar atelectasis present. There is no evidence of pulmonary edema, consolidation, pneumothorax, nodule or pleural fluid. Stable osteopenia and spondylosis of the thoracic spine.  IMPRESSION: Low volumes with bibasilar atelectasis.  No active disease.   Electronically Signed   By: Aletta Edouard M.D.   On: 02/27/2015 11:28   Ct Head Wo Contrast  02/27/2015   CLINICAL DATA:  Prior low anterior resection of rectal carcinoma in May, 2013. Recent TURBT 02/23/2015 for transitional cell carcinoma. Prior history of melanoma. Patient presents with a generalized headache which began acutely earlier today. Patient had a similar episode 2 days ago.  EXAM: CT HEAD WITHOUT CONTRAST  TECHNIQUE: Contiguous axial images were obtained from the base of the skull through the vertex without intravenous contrast.  COMPARISON:  None.  FINDINGS: Ventricular system normal in size and appearance for age. No mass lesion. No midline shift. No acute hemorrhage or hematoma. No extra-axial fluid collections. No evidence of acute infarction. No focal brain parenchymal abnormality.  No skull fracture or other focal osseous abnormality involving the skull. Visualized paranasal sinuses, bilateral mastoid air cells and bilateral middle ear cavities well-aerated. Minimal bilateral carotid siphon atherosclerosis.  IMPRESSION: Normal intracranially.   Electronically Signed   By: Evangeline Dakin M.D.   On: 02/27/2015 11:15         Assessment & Plan:  1. Coronary artery disease: The patient presents with symptoms consistent with an ST segment elevation myocardial infarction. Has mild troponin elevation as well as mild ST segment elevation in the inferior and lateral leads. I discussed the case with the interventional team. We will proceed with cardiac catheterization today. I discussed with the patient and his family the risks, benefits, and options of cardiac  catheterization and urgent PCI. They understand and agree to proceed.  2. History of pulmonary emboli: The patient has a history of pulmonary blood. He'll likely need to go back on Coumadin.    3. Chronic renal insufficiency: Dr. Burt Knack is aware and will use minimal contrast.  4. Morbid obesity :   5. Essential hypertension:     DVT PPX -    Thayer Headings, Brooke Bonito., MD, [facility] 02/27/2015, 12:51 PM

## 2015-02-27 NOTE — Progress Notes (Addendum)
ANTICOAGULATION CONSULT NOTE - Initial Consult  Pharmacy Consult for Heparin and Warfarin Indication: chest pain/ACS  No Known Allergies  Patient Measurements:   Heparin Dosing Weight: 112 kg   Vital Signs: Temp: 98.4 F (36.9 C) (07/24 0945) BP: 139/61 mmHg (07/24 0948) Pulse Rate: 60 (07/24 0948)  Labs:  Recent Labs  02/27/15 0945  HGB 9.5*  HCT 30.5*  PLT 203  LABPROT 15.0  INR 1.17  CREATININE 1.61*  TROPONINI 0.14*    Estimated Creatinine Clearance: 73.2 mL/min (by C-G formula based on Cr of 1.61).   Medical History: Past Medical History  Diagnosis Date  . Hypertension   . Pulmonary embolism 12-03-11    01-17-10 s/p DVT left leg -Coumadin tx.until 08-28-11  . Hyperlipidemia   . Leg swelling 12-03-11    bilateral if up most of day,equally swells, up to knee level, improved now  . Fractures 12-03-11    s/p right ankle and right wrist- no problems now  . Hypothyroidism   . Dyslipidemia   . Anemia 12-03-11    tx. oral iron supplement  . Arthritis 12-03-11    mild lower back  . 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 Chronic kidney disease 12-03-11    kidney stone x1 ,none recent stage III  . Secondary hyperparathyroidism, renal   . Chronic anticoagulation   . Shortness of breath 12-03-11    hx. 6'11- during pulmonary emboli episode, none now, extreme exertion only.  . Morbid obesity   . Vitamin D deficiency   . Chronic pulmonary edema   . Dorsalgia     Medications:   (Not in a hospital admission)  Assessment: 51 YOM with h/o HTN and PE on coumadin presented with severe headaches and left sided chest pain radiating to the left arm and jaw. Troponin was positive in the ED. Pharmacy consulted to start IV heparin for ACS. INR is sub-therapeutic at 1.17 because patient had stopped  taking Coumadin for a procedure and restarted it yesterday. H/H low, Plt wnl   PTA Warfarin Dose: 2.5mg  Sat and 5mg  AODs  Goal of Therapy:  INR 2-3 Heparin level 0.3-0.7 units/ml Monitor platelets by anticoagulation protocol: Yes   Plan:  -Give heparin 4000 units IV bolus followed by infusion at 1550 units/hr -F/u 6 hour HL -Monitor daily HL, CBC and s/s of bleeding  Albertina Parr, PharmD., BCPS Clinical Pharmacist Pager [phone]  ADDN: Pharmacy is consulted to dose warfarin for history of PE. INR is 1.17.  Plan: Warfarin 5mg  tonight x1 Daily INR  Andrey Cota. Diona Foley, PharmD Clinical Pharmacist Pager [phone]

## 2015-02-27 NOTE — ED Provider Notes (Signed)
CSN: [id number]     Arrival date & time 02/27/15  0940 History   First MD Initiated Contact with Patient 02/27/15 1003     Chief Complaint  Patient presents with  . Chest Pain  . Headache     (Consider location/radiation/quality/duration/timing/severity/associated sxs/prior Treatment) The history is provided by the patient.  Richard Warner is a 64 y.o. male hx of HTN, PE on coumadin, rectal cancer here with headache, chest pain. Headaches 2 days ago, diffuse, improved with tylenol then resolved. Today, woke up with severe headaches again that is diffuse. Also had left sided chest pain radiate to the left arm and jaw. Denies shortness of breath. His coumadin for held for several days since he had a bladder biopsy several days ago. States that he never had this kind of headaches before. Saw Dr. Stanford Breed in the past, denies hx of CAD or cardiac stents. Given ASA, nitro by EMS and pain improved.    Past Medical History  Diagnosis Date  . Hypertension   . Pulmonary embolism 12-03-11    01-17-10 s/p DVT left leg -Coumadin tx.until 08-28-11  . Hyperlipidemia   . Leg swelling 12-03-11    bilateral if up most of day,equally swells, up to knee level, improved now  . Fractures 12-03-11    s/p right ankle and right wrist- no problems now  . Hypothyroidism   . Dyslipidemia   . Anemia 12-03-11    tx. oral iron supplement  . Arthritis 12-03-11    mild lower back  . 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 Chronic kidney disease 12-03-11    kidney stone x1 ,none recent stage III  . Secondary hyperparathyroidism, renal   . Chronic anticoagulation   . Shortness of breath 12-03-11    hx. 6'11- during pulmonary emboli episode, none now, extreme exertion only.  . Morbid obesity   . Vitamin D deficiency   . Chronic pulmonary  edema   . Dorsalgia    Past Surgical History  Procedure Laterality Date  . Colonoscopy  10/08/2011    Procedure: COLONOSCOPY;  Surgeon: Inda Castle, MD;  Location: [facility];  Service: Endoscopy;  Laterality: N/A;  . Tonsillectomy  12-03-11    child  . Proctoscopy  12/14/2011    Procedure: PROCTOSCOPY;  Surgeon: [name], MD;  Location: [facility];  Service: General;  Laterality: N/A;  . Low anterior bowel resection  12/14/2011    Mid/proximal rectal cancer. T3 N0  . Colonoscopy N/A 05/15/2013    Procedure: COLONOSCOPY;  Surgeon: Inda Castle, MD;  Location: [facility];  Service: Endoscopy;  Laterality: N/A;  . Colonoscopy N/A 07/27/2014    Procedure: COLONOSCOPY;  Surgeon: Inda Castle, MD;  Location: [facility];  Service: Endoscopy;  Laterality: N/A;  . Cyst removal trunk N/A 09/17/2014    Procedure: REMOVAL OF ANTERIOR CHEST WALL SKIN MASS;  Surgeon: Michael Boston, MD;  Location: [facility];  Service: General;  Laterality: N/A;  . Cystoscopy with biopsy N/A 12/03/2014    Procedure: CYSTOSCOPY WITH BIOPSY;  Surgeon: Lowella Bandy, MD;  Location: [facility];  Service: Urology;  Laterality: N/A;  . Eus N/A 01/06/2015    Procedure: LOWER ENDOSCOPIC ULTRASOUND (EUS);  Surgeon: [name], MD;  Location: [facility];  Service: Endoscopy;  Laterality: N/A;  . Transurethral resection of bladder tumor N/A 02/23/2015  Procedure: TRANSURETHRAL RESECTION OF BLADDER MASS;  Surgeon: Lowella Bandy, MD;  Location: [facility];  Service: Urology;  Laterality: N/A;  . Cystoscopy N/A 02/23/2015    Procedure: CYSTOSCOPY;  Surgeon: Lowella Bandy, MD;  Location: [facility];  Service: Urology;  Laterality: N/A;   Family History  Problem Relation Age of Onset  . Diabetes Father   . Heart disease Father   . Stroke Maternal Grandmother   . Stroke Maternal Grandfather   . Hypertension Paternal Grandfather   . Lung cancer Paternal Grandfather   . Cancer Paternal Grandfather    History  Substance Use Topics  .  Smoking status: Never Smoker   . Smokeless tobacco: Never Used  . Alcohol Use: No    Review of Systems  Cardiovascular: Positive for chest pain.  Neurological: Positive for headaches.  All other systems reviewed and are negative.     Allergies  Review of patient's allergies indicates no known allergies.  Home Medications   Prior to Admission medications   Medication Sig Start Date End Date Taking? Authorizing Provider  acetaminophen (TYLENOL) 500 MG tablet Take 1,000 mg by mouth every 6 (six) hours as needed for headache.   Yes Historical Provider, MD  aspirin EC 81 MG tablet Take 81 mg by mouth every morning.    Yes Historical Provider, MD  atenolol (TENORMIN) 100 MG tablet Take 100 mg by mouth every evening.    Yes Historical Provider, MD  Cholecalciferol (VITAMIN D3) 5000 UNITS TABS Take 1 capsule by mouth at bedtime.    Yes Historical Provider, MD  doxazosin (CARDURA) 2 MG tablet Take 2 mg by mouth at bedtime.   Yes Historical Provider, MD  ezetimibe (ZETIA) 10 MG tablet Take 10 mg by mouth at bedtime.    Yes Historical Provider, MD  furosemide (LASIX) 20 MG tablet Take 10 mg by mouth at bedtime.    Yes Historical Provider, MD  HYDROcodone-acetaminophen (NORCO/VICODIN) 5-325 MG per tablet Take 1 tablet by mouth every 6 (six) hours as needed for moderate pain.   Yes Historical Provider, MD  levothyroxine (SYNTHROID, LEVOTHROID) 88 MCG tablet Take 88 mcg by mouth daily before breakfast.   Yes Historical Provider, MD  nitroGLYCERIN (NITROSTAT) 0.4 MG SL tablet Place 0.4 mg under the tongue every 5 (five) minutes as needed for chest pain.   Yes Historical Provider, MD  simvastatin (ZOCOR) 40 MG tablet Take 40 mg by mouth every evening.   Yes Historical Provider, MD  warfarin (COUMADIN) 5 MG tablet TAKE AS DIRECTED BY ANTICOAGULATION CLINIC Patient taking differently: Take 2.5-5 mg by mouth daily. Takes 1 tablet everyday except for Saturday takes 1/2 tablet 09/07/14  Yes Tanda Rockers,  MD   BP 134/67 mmHg  Pulse 56  Temp(Src) 98.4 F (36.9 C)  Resp 14  SpO2 100% Physical Exam  Constitutional: He is oriented to person, place, and time.  Uncomfortable, obese   HENT:  Head: Normocephalic.  Mouth/Throat: Oropharynx is clear and moist.  Eyes: Conjunctivae are normal. Pupils are equal, round, and reactive to light.  Neck: Normal range of motion. Neck supple.  Cardiovascular: Normal rate, regular rhythm and normal heart sounds.   Pulmonary/Chest: Effort normal and breath sounds normal. No respiratory distress. He has no wheezes. He has no rales.  Abdominal: Soft. Bowel sounds are normal. He exhibits no distension. There is no tenderness. There is no rebound.  Musculoskeletal: Normal range of motion. He exhibits no edema or tenderness.  Neurological: He is alert and oriented to  person, place, and time. No cranial nerve deficit. Coordination normal.  Nl strength throughout. CN 2-12 intact   Skin: Skin is warm and dry.  Psychiatric: He has a normal mood and affect. His behavior is normal. Judgment and thought content normal.  Nursing note and vitals reviewed.   ED Course  Procedures (including critical care time)  CRITICAL CARE Performed by: Darl Householder, Guthrie Lemme   Total critical care time: 30 min   Critical care time was exclusive of separately billable procedures and treating other patients.  Critical care was necessary to treat or prevent imminent or life-threatening deterioration.  Critical care was time spent personally by me on the following activities: development of treatment plan with patient and/or surrogate as well as nursing, discussions with consultants, evaluation of patient's response to treatment, examination of patient, obtaining history from patient or surrogate, ordering and performing treatments and interventions, ordering and review of laboratory studies, ordering and review of radiographic studies, pulse oximetry and re-evaluation of patient's  condition.   Labs Review Labs Reviewed  BASIC METABOLIC PANEL - Abnormal; Notable for the following:    Glucose, Bld 172 (*)    BUN 29 (*)    Creatinine, Ser 1.61 (*)    Calcium 8.6 (*)    GFR calc non Af Amer 44 (*)    GFR calc Af Amer 51 (*)    All other components within normal limits  CBC - Abnormal; Notable for the following:    RBC 3.70 (*)    Hemoglobin 9.5 (*)    HCT 30.5 (*)    MCH 25.7 (*)    All other components within normal limits  BRAIN NATRIURETIC PEPTIDE - Abnormal; Notable for the following:    B Natriuretic Peptide 165.3 (*)    All other components within normal limits  TROPONIN I - Abnormal; Notable for the following:    Troponin I 0.14 (*)    All other components within normal limits  I-STAT TROPOININ, ED - Abnormal; Notable for the following:    Troponin i, poc 0.13 (*)    All other components within normal limits  PROTIME-INR    Imaging Review Dg Chest 2 View  02/27/2015   CLINICAL DATA:  Chest pain and shortness of breath. History of rectal carcinoma.  EXAM: CHEST - 2 VIEW  COMPARISON:  09/13/2014  FINDINGS: Stable cardiac enlargement. Lung volumes are low with bibasilar atelectasis present. There is no evidence of pulmonary edema, consolidation, pneumothorax, nodule or pleural fluid. Stable osteopenia and spondylosis of the thoracic spine.  IMPRESSION: Low volumes with bibasilar atelectasis.  No active disease.   Electronically Signed   By: Aletta Edouard M.D.   On: 02/27/2015 11:28   Ct Head Wo Contrast  02/27/2015   CLINICAL DATA:  Prior low anterior resection of rectal carcinoma in May, 2013. Recent TURBT 02/23/2015 for transitional cell carcinoma. Prior history of melanoma. Patient presents with a generalized headache which began acutely earlier today. Patient had a similar episode 2 days ago.  EXAM: CT HEAD WITHOUT CONTRAST  TECHNIQUE: Contiguous axial images were obtained from the base of the skull through the vertex without intravenous contrast.   COMPARISON:  None.  FINDINGS: Ventricular system normal in size and appearance for age. No mass lesion. No midline shift. No acute hemorrhage or hematoma. No extra-axial fluid collections. No evidence of acute infarction. No focal brain parenchymal abnormality.  No skull fracture or other focal osseous abnormality involving the skull. Visualized paranasal sinuses, bilateral mastoid air cells and bilateral  middle ear cavities well-aerated. Minimal bilateral carotid siphon atherosclerosis.  IMPRESSION: Normal intracranially.   Electronically Signed   By: Evangeline Dakin M.D.   On: 02/27/2015 11:15     EKG Interpretation   Date/Time:  Sunday February 27 2015 12:21:36 EDT Ventricular Rate:  57 PR Interval:  143 QRS Duration: 76 QT Interval:  442 QTC Calculation: 430 R Axis:   21 Text Interpretation:  Sinus rhythm Low voltage, precordial leads Abnormal  R-wave progression, early transition Minimal ST depression No significant  change since last tracing Confirmed by Dariona Postma  MD, Ajdin Macke ([id number]) on 02/27/2015  12:37:34 PM      MDM   Final diagnoses:  None    ESSIE GEHRET is a 64 y.o. male here with chest pain, headaches. Chest pain concerning for ACS. Will get trop. No shortness of breath and I doubt PE. Headaches consider SAH given sudden onset. No signs of meningitis. Will get CT head, if neg, will not need LP. Will give migraine cocktail.   12:37 PM Trop positive 0.13. Felt better with heparin, nitro drip. Repeat EKG unchanged. Contacted Dr. Acie Fredrickson, who noticed subtle elevation aVF that resolved. Will not activate STEMI. Cardiology to cath.     Wandra Arthurs, MD 02/27/15 1239

## 2015-02-27 NOTE — Interval H&P Note (Signed)
History and Physical Interval Note:  02/27/2015 1:27 PM  Richard Warner  has presented today for surgery, with the diagnosis of NON Stemi  The various methods of treatment have been discussed with the patient and family. After consideration of risks, benefits and other options for treatment, the patient has consented to  Procedure(s): Left Heart Cath (N/A) as a surgical intervention .  The patient's history has been reviewed, patient examined, no change in status, stable for surgery.  I have reviewed the patient's chart and labs.  Questions were answered to the patient's satisfaction.    Cath Lab Visit (complete for each Cath Lab visit)  Clinical Evaluation Leading to the Procedure:   ACS: Yes.    Non-ACS:    Anginal Classification: CCS IV  Anti-ischemic medical therapy: No Therapy  Non-Invasive Test Results: No non-invasive testing performed  Prior CABG: No previous CABG       [name]

## 2015-02-27 NOTE — ED Notes (Signed)
Pt arrives via EMS - Woke up with constant, sharp, steady headache from ear-to-ear. Began having left-sided CP radiating to left arm, given nitro x4 and 324mg  aspirin.  Hx PE, DVT, cancer. Initial BP 182/90, last  BP 136/74. CP now 5/10

## 2015-02-28 ENCOUNTER — Encounter (HOSPITAL_COMMUNITY): Payer: Self-pay | Admitting: Cardiovascular Disease

## 2015-02-28 ENCOUNTER — Other Ambulatory Visit: Payer: Self-pay | Admitting: *Deleted

## 2015-02-28 ENCOUNTER — Inpatient Hospital Stay (HOSPITAL_COMMUNITY): Payer: Medicare Other

## 2015-02-28 ENCOUNTER — Telehealth: Payer: Self-pay | Admitting: *Deleted

## 2015-02-28 LAB — BASIC METABOLIC PANEL
Anion gap: 7 (ref 5–15)
BUN: 24 mg/dL — ABNORMAL HIGH (ref 6–20)
CO2: 25 mmol/L (ref 22–32)
CREATININE: 1.47 mg/dL — AB (ref 0.61–1.24)
Calcium: 8.6 mg/dL — ABNORMAL LOW (ref 8.9–10.3)
Chloride: 109 mmol/L (ref 101–111)
GFR, EST AFRICAN AMERICAN: 56 mL/min — AB (ref >60)
GFR, EST NON AFRICAN AMERICAN: 49 mL/min — AB (ref >60)
Glucose, Bld: 111 mg/dL — ABNORMAL HIGH (ref 65–99)
Potassium: 4.3 mmol/L (ref 3.5–5.1)
Sodium: 141 mmol/L (ref 135–145)

## 2015-02-28 LAB — PROTIME-INR
INR: 1.15 (ref 0.00–1.49)
PROTHROMBIN TIME: 14.9 s (ref 11.6–15.2)

## 2015-02-28 LAB — CBC
HCT: 28.1 % — ABNORMAL LOW (ref 39.0–52.0)
HCT: 30 % — ABNORMAL LOW (ref 39.0–52.0)
Hemoglobin: 9.1 g/dL — ABNORMAL LOW (ref 13.0–17.0)
Hemoglobin: 9.5 g/dL — ABNORMAL LOW (ref 13.0–17.0)
MCH: 26.6 pg (ref 26.0–34.0)
MCH: 25.8 pg — AB (ref 26.0–34.0)
MCHC: 32.4 g/dL (ref 30.0–36.0)
MCHC: 31.7 g/dL (ref 30.0–36.0)
MCV: 82.2 fL (ref 78.0–100.0)
MCV: 81.5 fL (ref 78.0–100.0)
PLATELETS: 211 10*3/uL (ref 150–400)
Platelets: 183 10*3/uL (ref 150–400)
RBC: 3.42 MIL/uL — ABNORMAL LOW (ref 4.22–5.81)
RBC: 3.68 MIL/uL — AB (ref 4.22–5.81)
RDW: 14.6 % (ref 11.5–15.5)
RDW: 14.5 % (ref 11.5–15.5)
WBC: 6.1 10*3/uL (ref 4.0–10.5)
WBC: 7 10*3/uL (ref 4.0–10.5)

## 2015-02-28 LAB — LIPID PANEL
CHOLESTEROL: 107 mg/dL (ref 0–200)
HDL: 29 mg/dL — AB (ref >40)
LDL CALC: 55 mg/dL (ref 0–99)
Total CHOL/HDL Ratio: 3.7 RATIO
Triglycerides: 114 mg/dL (ref <150)
VLDL: 23 mg/dL (ref 0–40)

## 2015-02-28 LAB — TROPONIN I: Troponin I: >65 ng/mL (ref <0.031)

## 2015-02-28 MED ORDER — SENNOSIDES-DOCUSATE SODIUM 8.6-50 MG PO TABS
1.0000 | ORAL_TABLET | Freq: Two times a day (BID) | ORAL | Status: DC
  Administered 2015-02-28 – 2015-03-02 (×4): 1 via ORAL
  Filled 2015-02-28 (×4): qty 1

## 2015-02-28 MED ORDER — WARFARIN SODIUM 5 MG PO TABS
5.0000 mg | ORAL_TABLET | Freq: Once | ORAL | Status: AC
  Administered 2015-02-28: 5 mg via ORAL
  Filled 2015-02-28 (×2): qty 1

## 2015-02-28 MED FILL — Sodium Chloride IV Soln 0.9%: INTRAVENOUS | Qty: 50 | Status: AC

## 2015-02-28 MED FILL — Nitroglycerin IV Soln 100 MCG/ML in D5W: INTRA_ARTERIAL | Qty: 10 | Status: AC

## 2015-02-28 MED FILL — Lidocaine HCl Local Preservative Free (PF) Inj 1%: INTRAMUSCULAR | Qty: 30 | Status: AC

## 2015-02-28 MED FILL — Heparin Sodium (Porcine) 2 Unit/ML in Sodium Chloride 0.9%: INTRAMUSCULAR | Qty: 1500 | Status: AC

## 2015-02-28 MED FILL — Bivalirudin For IV Soln 250 MG: INTRAVENOUS | Qty: 250 | Status: AC

## 2015-02-28 MED FILL — Clopidogrel Bisulfate Tab 300 MG (Base Equiv): ORAL | Qty: 2 | Status: AC

## 2015-02-28 NOTE — Progress Notes (Signed)
Paged by nurse, stating that patient has passed blood clot in urine, approximate size of penny. Pt states "he is constipated and that when he strains he passes blood clots from his bladder surgery. Patient had a cystoscopy, TUR bladder tumor by Dr. Janice Norrie 02/23/15. Patient presented 7/24 for acute posterior wall MI went for cath that showed total occulusion of 1st OM s/p PCI + DES. Case discussed with Dr. Radford Pax and plan made to manage patient per urology recommendation. Case discussed with on call Urology resident, [facility]. Patient is voiding without any difficulty. Per recommendation will get bladder scan by nurse, no intervention  If less than 500cc residual urine. If above, 500cc - will get bladder US. Until then continue current therapy including ASA, plavix and warfarin as planned. Will get stat CBC to get baseline hemoglobin. Will repeat in morning.    Blaine Hari, PA-C

## 2015-02-28 NOTE — Progress Notes (Signed)
Patient Profile: 64 y.o. male with a PMHx of morbid obesity, hypertension, pulmonary embolus, rectal cancer, and now with bladder cancer who was admitted to [facility] on 02/27/2015 for evaluation of ongoing chest discomfort, headache, and signs of an ST segment elevation myocardial infarction.  Subjective: Feels significantly better. No recurrent CP. No dyspnea. Right radial access site free from complication.   Objective: Vital signs in last 24 hours: Temp:  [97.9 F (36.6 C)-98.2 F (36.8 C)] 98.2 F (36.8 C) (07/25 0700) Pulse Rate:  [0-92] 76 (07/25 1000) Resp:  [0-42] 15 (07/25 1000) BP: (112-154)/(38-80) 126/65 mmHg (07/25 1000) SpO2:  [0 %-100 %] 96 % (07/25 1000) Weight:  [376 lb 15.8 oz (171 kg)] 376 lb 15.8 oz (171 kg) (07/24 1545)    Intake/Output from previous day: 07/24 0701 - 07/25 0700 In: 701.1 [P.O.:240; I.V.:461.1] Out: 1925 [Urine:1925] Intake/Output this shift: Total I/O In: 240 [P.O.:240] Out: 300 [Urine:300]  Medications Current Facility-Administered Medications  Medication Dose Route Frequency Provider Last Rate Last Dose  . 0.9 %  sodium chloride infusion  250 mL Intravenous PRN [name], MD      . acetaminophen (TYLENOL) tablet 650 mg  650 mg Oral Q4H PRN [name], MD      . antiseptic oral rinse (CPC / CETYLPYRIDINIUM CHLORIDE 0.05%) solution 7 mL  7 mL Mouth Rinse BID [name], MD   7 mL at 02/28/15 0916  . aspirin EC tablet 81 mg  81 mg Oral q morning - 10a [name], MD   81 mg at 02/28/15 5170  . atorvastatin (LIPITOR) tablet 80 mg  80 mg Oral q1800 [name], MD      . clopidogrel (PLAVIX) tablet 75 mg  75 mg Oral Q breakfast [name], MD   75 mg at 02/28/15 0740  . doxazosin (CARDURA) tablet 2 mg  2 mg Oral QHS [name], MD   2 mg at 02/27/15 2111  . ezetimibe (ZETIA) tablet 10 mg  10 mg Oral QHS [name], MD   10 mg at 02/27/15 2112  . heparin injection 5,000 Units  5,000 Units Subcutaneous 3 times  per day [name], MD   5,000 Units at 02/28/15 [phone]  . levothyroxine (SYNTHROID, LEVOTHROID) tablet 88 mcg  88 mcg Oral QAC breakfast [name], MD   88 mcg at 02/28/15 0740  . metoprolol tartrate (LOPRESSOR) tablet 12.5 mg  12.5 mg Oral BID [name], MD   12.5 mg at 02/28/15 0916  . morphine 2 MG/ML injection 2 mg  2 mg Intravenous Q1H PRN [name], MD      . nitroGLYCERIN (NITROSTAT) SL tablet 0.4 mg  0.4 mg Sublingual Q5 Min x 3 PRN [name], MD      . ondansetron [facility]) injection 4 mg  4 mg Intravenous Q6H PRN [name], MD      . oxyCODONE-acetaminophen (PERCOCET/ROXICET) 5-325 MG per tablet 1-2 tablet  1-2 tablet Oral Q4H PRN [name], MD      . sodium chloride 0.9 % injection 3 mL  3 mL Intravenous Q12H [name], MD   3 mL at 02/28/15 0917  . sodium chloride 0.9 % injection 3 mL  3 mL Intravenous PRN [name], MD      . warfarin (COUMADIN) tablet 5 mg  5 mg Oral ONCE-1800 Wynelle Fanny, [facility]      . Warfarin - Pharmacist Dosing Inpatient   Does not apply [place], [facility]  PE: General appearance: alert, cooperative and no distress Neck: no carotid bruit and no JVD Lungs: clear to auscultation bilaterally Heart: regular rate and rhythm, S1, S2 normal, no murmur, click, rub or gallop Extremities: trace LEE Pulses: 2+ and symmetric Skin: warm and dry Neurologic: Grossly normal  Lab Results:   Recent Labs  02/27/15 0945 02/27/15 1555 02/28/15 0216  WBC 6.3 6.8 6.1  HGB 9.5* 9.1* 9.1*  HCT 30.5* 28.6* 28.1*  PLT 203 192 183   BMET  Recent Labs  02/27/15 0945 02/27/15 1555 02/28/15 0216  NA 142  --  141  K 4.3  --  4.3  CL 110  --  109  CO2 24  --  25  GLUCOSE 172*  --  111*  BUN 29*  --  24*  CREATININE 1.61* 1.52* 1.47*  CALCIUM 8.6*  --  8.6*   PT/INR  Recent Labs  02/27/15 0945 02/28/15 0216  LABPROT 15.0 14.9  INR 1.17 1.15   Cholesterol  Recent Labs  02/28/15 0216  CHOL 107    Cardiac Panel (last 3 results)  Recent Labs  02/27/15 1555 02/27/15 2109 02/28/15 0216  TROPONINI >65.00* >65.00* >65.00*    Studies/Results: [facility] 02/27/15  Conclusion    1. Prox LAD lesion, 50% stenosed. 2. 1st Mrg lesion, 100% stenosed. There is a 0% residual stenosis post intervention. 3. A drug-eluting stent was placed. 4. Prox RCA lesion, 60% stenosed. 5. Mid RCA lesion, 50% stenosed. 6. Dist RCA lesion, 40% stenosed.      Assessment/Plan    Active Problems:   Acute MI, true posterior wall, initial episode of care   Acute MI inferior posterior subsequent episode care   1. Acute Posterior Wall MI: total occulusion of 1st OM s/p PCI + DES. Also with residual moderate diffuse 3V CAD to be treated medically. No recurrent CP. Continue triple therapy, ASA, Plavix and Warfarin unit INR is therapeutic, then continue Plavix and Warfarin only.  Continue high dose statin and BB for secondary prevention.   2. Post Cath: renal function and right radial cath site both stable.   3. HTN: BP well controlled.   4. H/O PE: continue Warfarin. Continue ASA until INR therapeutic.    5. Bladder Cancer: on antiplatelet therapy for recently placed DES. No surgery until cleared by MD (at least 3 months).   Plan to transfer to telemetry today.    LOS: 1 day    Brittainy M. Ladoris Gene 02/28/2015 10:46 AM  Patient seen, examined. Available data reviewed. Agree with findings, assessment, and plan as outlined by Lyda Jester, PA-C. The patient looks good now one day out from his MI. Troponins are markedly elevated, but he has been CP-free. OK to start light activity with cardiac rehab today. I agree with medications as outlined. Will plan on stopping ASA once his INR is therapeutic and he will continue on a program of warfarin and clopidogrel. Tx tele today.  [name], M.D. 02/28/2015 11:05 AM

## 2015-02-28 NOTE — Progress Notes (Signed)
CARDIAC REHAB PHASE I   PRE:  Rate/Rhythm: 89 SR    BP: sitting 146/79    SaO2: 99 RA  MODE:  Ambulation: 190 ft   POST:  Rate/Rhythm: 110 ST    BP: sitting 153/60     SaO2: 99 RA   Pt steady, some SOB with HR elevated. No other c/o. Sts he was somewhat SOB walking to mailbox the last few weeks. Return to recliner. Began ed. Pt lives with mother who does the housecleaning.  [phone]  Josephina Shih [place] CES, ACSM 02/28/2015 1:39 PM

## 2015-02-28 NOTE — Progress Notes (Signed)
Bladder scanned pt. 37ml when bladder scanned. Notified Verdis Frederickson, MD. Order to discontinue [place] ABD limited. Will continue to monitor.   Ruben Reason, RN

## 2015-02-28 NOTE — Progress Notes (Signed)
Called to pt room by NT to assess blood clot in urine. Noted a blood clot approximately the size of a penny. Pt states "he is constipated and that when he strains he passes blood clots from his bladder surgery." Notified Vin Bhagat, PA. No new orders received. Will continue to monitor.    Ruben Reason, RN

## 2015-02-28 NOTE — Consult Note (Signed)
Urology Consult   Physician requesting consult: [name], MD  Reason for consult: Gross Hematuria  History of Present Illness: Richard Warner is a 64 y.o. with history of rectal cancer s/p resection and chemoradiation therapy in 2013 as well as bladder mass and associated gross hematuria. He recently underwent TURBT with Dr. Janice Norrie 02/23/15 during which the tumor was difficult to visualize but the procedure was otherwise uncomplicated. He followed up recently for a voiding trial and his urine was clear. Pathology was consistent with adenocarcinoma indicative of metastatic rectal cancer. He is followed by Dr. Benay Spice for this.  Unfortunately he presented to the [facility] emergency department with symptoms of ongoing chest discomfort, headache and signs of an ST segment elevation myocardial infarction. He was noted to have mildly elevated troponins as well as ST segment elevation in the inferior and lateral leads. He underwent percutaneous intervention 02/28/2015 which was reportedly successful. He was noted our proximal LAD lesion which is 50% stenosis as well as a first marginal branch lesion which is 100% stenosed. A drug-eluting stent was placed.  He has been started on triple anticoagulation with aspirin, Plavix and warfarin until his INR is therapeutic with plans to continue Plavix and warfarin only for an extended period of time.  He was noted earlier this evening to have an episode of clot passage which per report was the size of a penny. Per report his last void was at 5:30 pm. He was complaining of constipation, was straining to have a bowel movement. He has not passed any clots at any other time and is not complaining of difficulty voiding.  He needs to be anticoagulated because of his drug-eluting stent.   Past Medical History  Diagnosis Date  . Hypertension   . Pulmonary embolism 12-03-11    01-17-10 s/p DVT left leg -Coumadin tx.until 08-28-11  . Hyperlipidemia   . Leg  swelling 12-03-11    bilateral if up most of day,equally swells, up to knee level, improved now  . Fractures 12-03-11    s/p right ankle and right wrist- no problems now  . Hypothyroidism   . Dyslipidemia   . Anemia 12-03-11    tx. oral iron supplement  . Arthritis 12-03-11    mild lower back  . 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 Chronic kidney disease 12-03-11    kidney stone x1 ,none recent stage III  . Secondary hyperparathyroidism, renal   . Chronic anticoagulation   . Shortness of breath 12-03-11    hx. 6'11- during pulmonary emboli episode, none now, extreme exertion only.  . Morbid obesity   . Vitamin D deficiency   . Chronic pulmonary edema   . Dorsalgia   . Acute MI, true posterior wall, initial episode of care 02/27/2015    Past Surgical History  Procedure Laterality Date  . Colonoscopy  10/08/2011    Procedure: COLONOSCOPY;  Surgeon: Inda Castle, MD;  Location: [facility];  Service: Endoscopy;  Laterality: N/A;  . Tonsillectomy  12-03-11    child  . Proctoscopy  12/14/2011    Procedure: PROCTOSCOPY;  Surgeon: [name], MD;  Location: [facility];  Service: General;  Laterality: N/A;  . Low anterior bowel resection  12/14/2011    Mid/proximal rectal cancer. T3 N0  . Colonoscopy N/A 05/15/2013    Procedure: COLONOSCOPY;  Surgeon: Sandy Salaam  Deatra Ina, MD;  Location: [facility];  Service: Endoscopy;  Laterality: N/A;  . Colonoscopy N/A 07/27/2014    Procedure: COLONOSCOPY;  Surgeon: Inda Castle, MD;  Location: [facility];  Service: Endoscopy;  Laterality: N/A;  . Cyst removal trunk N/A 09/17/2014    Procedure: REMOVAL OF ANTERIOR CHEST WALL SKIN MASS;  Surgeon: Michael Boston, MD;  Location: [facility];  Service: General;  Laterality: N/A;  . Cystoscopy with biopsy N/A 12/03/2014    Procedure:  CYSTOSCOPY WITH BIOPSY;  Surgeon: Lowella Bandy, MD;  Location: [facility];  Service: Urology;  Laterality: N/A;  . Eus N/A 01/06/2015    Procedure: LOWER ENDOSCOPIC ULTRASOUND (EUS);  Surgeon: [name], MD;  Location: [facility];  Service: Endoscopy;  Laterality: N/A;  . Transurethral resection of bladder tumor N/A 02/23/2015    Procedure: TRANSURETHRAL RESECTION OF BLADDER MASS;  Surgeon: Lowella Bandy, MD;  Location: [facility];  Service: Urology;  Laterality: N/A;  . Cystoscopy N/A 02/23/2015    Procedure: CYSTOSCOPY;  Surgeon: Lowella Bandy, MD;  Location: [facility];  Service: Urology;  Laterality: N/A;  . Cardiac catheterization N/A 02/27/2015    Procedure: Coronary Stent Intervention;  Surgeon: [name], MD;  Location: [facility];  Service: Cardiovascular;  Laterality: N/A;    Current Hospital Medications:  Home Meds:    Medication List    ASK your doctor about these medications        acetaminophen 500 MG tablet  Commonly known as:  TYLENOL  Take 1,000 mg by mouth every 6 (six) hours as needed for headache.     aspirin EC 81 MG tablet  Take 81 mg by mouth every morning.     atenolol 100 MG tablet  Commonly known as:  TENORMIN  Take 100 mg by mouth every evening.     doxazosin 2 MG tablet  Commonly known as:  CARDURA  Take 2 mg by mouth at bedtime.     ezetimibe 10 MG tablet  Commonly known as:  ZETIA  Take 10 mg by mouth at bedtime.     furosemide 20 MG tablet  Commonly known as:  LASIX  Take 10 mg by mouth at bedtime.     HYDROcodone-acetaminophen 5-325 MG per tablet  Commonly known as:  NORCO/VICODIN  Take 1 tablet by mouth every 6 (six) hours as needed for moderate pain.     levothyroxine 88 MCG tablet  Commonly known as:  SYNTHROID, LEVOTHROID  Take 88 mcg by mouth daily before breakfast.     nitroGLYCERIN 0.4 MG SL tablet  Commonly known as:  NITROSTAT  Place 0.4 mg under the tongue every 5 (five) minutes as needed for chest pain.     simvastatin 40 MG  tablet  Commonly known as:  ZOCOR  Take 40 mg by mouth every evening.     Vitamin D3 5000 UNITS Tabs  Take 1 capsule by mouth at bedtime.     warfarin 5 MG tablet  Commonly known as:  COUMADIN  TAKE AS DIRECTED BY ANTICOAGULATION CLINIC        Scheduled Meds: . antiseptic oral rinse  7 mL Mouth Rinse BID  . aspirin EC  81 mg Oral q morning - 10a  . atorvastatin  80 mg Oral q1800  . clopidogrel  75 mg Oral Q breakfast  . doxazosin  2 mg Oral QHS  . ezetimibe  10 mg Oral QHS  . heparin  5,000 Units Subcutaneous 3 times per day  .  levothyroxine  88 mcg Oral QAC breakfast  . metoprolol tartrate  12.5 mg Oral BID  . senna-docusate  1 tablet Oral BID  . Warfarin - Pharmacist Dosing Inpatient   Does not apply q1800   Continuous Infusions:  PRN Meds:.acetaminophen, nitroGLYCERIN, ondansetron (ZOFRAN) IV, oxyCODONE-acetaminophen, sodium chloride  Allergies: No Known Allergies  Family History  Problem Relation Age of Onset  . Diabetes Father   . Heart disease Father   . Stroke Maternal Grandmother   . Stroke Maternal Grandfather   . Hypertension Paternal Grandfather   . Lung cancer Paternal Grandfather   . Cancer Paternal Grandfather     Social History:  reports that he has never smoked. He has never used smokeless tobacco. He reports that he does not drink alcohol or use illicit drugs.  ROS: A complete review of systems was performed.  All systems are negative except for pertinent findings as noted.  Physical Exam:  Vital signs in last 24 hours: Temp:  [97.4 F (36.3 C)-98.2 F (36.8 C)] 97.7 F (36.5 C) (07/25 1442) Pulse Rate:  [58-88] 88 (07/25 1442) Resp:  [13-25] 20 (07/25 1442) BP: (116-154)/(40-79) 123/54 mmHg (07/25 1442) SpO2:  [95 %-99 %] 98 % (07/25 1442) Constitutional:  Alert and oriented, No acute distress Cardiovascular: Regular rate and rhythm, No JVD Respiratory: Normal respiratory effort, Lungs clear bilaterally GI: Abdomen is obese, soft,  nontender, nondistended, no abdominal masses GU: No CVA tenderness Lymphatic: No lymphadenopathy Neurologic: Grossly intact, no focal deficits Psychiatric: Normal mood and affect  Laboratory Data:   Recent Labs  02/27/15 0945 02/27/15 1555 02/28/15 0216  WBC 6.3 6.8 6.1  HGB 9.5* 9.1* 9.1*  HCT 30.5* 28.6* 28.1*  PLT 203 192 183     Recent Labs  02/27/15 0945 02/27/15 1555 02/28/15 0216  NA 142  --  141  K 4.3  --  4.3  CL 110  --  109  GLUCOSE 172*  --  111*  BUN 29*  --  24*  CALCIUM 8.6*  --  8.6*  CREATININE 1.61* 1.52* 1.47*     Results for orders placed or performed during the hospital encounter of 02/27/15 (from the past 24 hour(s))  Troponin I     Status: Abnormal   Collection Time: 02/27/15  9:09 PM  Result Value Ref Range   Troponin I >65.00 (HH) <0.031 ng/mL  Troponin I     Status: Abnormal   Collection Time: 02/28/15  2:16 AM  Result Value Ref Range   Troponin I >65.00 (HH) <0.031 ng/mL  Basic metabolic panel     Status: Abnormal   Collection Time: 02/28/15  2:16 AM  Result Value Ref Range   Sodium 141 135 - 145 mmol/L   Potassium 4.3 3.5 - 5.1 mmol/L   Chloride 109 101 - 111 mmol/L   CO2 25 22 - 32 mmol/L   Glucose, Bld 111 (H) 65 - 99 mg/dL   BUN 24 (H) 6 - 20 mg/dL   Creatinine, Ser 1.47 (H) 0.61 - 1.24 mg/dL   Calcium 8.6 (L) 8.9 - 10.3 mg/dL   GFR calc non Af Amer 49 (L) >60 mL/min   GFR calc Af Amer 56 (L) >60 mL/min   Anion gap 7 5 - 15  Protime-INR     Status: None   Collection Time: 02/28/15  2:16 AM  Result Value Ref Range   Prothrombin Time 14.9 11.6 - 15.2 seconds   INR 1.15 0.00 - 1.49  CBC  Status: Abnormal   Collection Time: 02/28/15  2:16 AM  Result Value Ref Range   WBC 6.1 4.0 - 10.5 K/uL   RBC 3.42 (L) 4.22 - 5.81 MIL/uL   Hemoglobin 9.1 (L) 13.0 - 17.0 g/dL   HCT 28.1 (L) 39.0 - 52.0 %   MCV 82.2 78.0 - 100.0 fL   MCH 26.6 26.0 - 34.0 pg   MCHC 32.4 30.0 - 36.0 g/dL   RDW 14.6 11.5 - 15.5 %   Platelets 183  150 - 400 K/uL  Lipid panel     Status: Abnormal   Collection Time: 02/28/15  2:16 AM  Result Value Ref Range   Cholesterol 107 0 - 200 mg/dL   Triglycerides 114 <150 mg/dL   HDL 29 (L) >40 mg/dL   Total CHOL/HDL Ratio 3.7 RATIO   VLDL 23 0 - 40 mg/dL   LDL Cholesterol 55 0 - 99 mg/dL   Recent Results (from the past 240 hour(s))  MRSA PCR Screening     Status: None   Collection Time: 02/27/15  3:20 PM  Result Value Ref Range Status   MRSA by PCR NEGATIVE NEGATIVE Final    Comment:        The GeneXpert MRSA Assay (FDA approved for NASAL specimens only), is one component of a comprehensive MRSA colonization surveillance program. It is not intended to diagnose MRSA infection nor to guide or monitor treatment for MRSA infections.     Renal Function:  Recent Labs  02/27/15 0945 02/27/15 1555 02/28/15 0216  CREATININE 1.61* 1.52* 1.47*   Estimated Creatinine Clearance: 81.6 mL/min (by C-G formula based on Cr of 1.47).  Radiologic Imaging: Dg Chest 2 View  02/27/2015   CLINICAL DATA:  Chest pain and shortness of breath. History of rectal carcinoma.  EXAM: CHEST - 2 VIEW  COMPARISON:  09/13/2014  FINDINGS: Stable cardiac enlargement. Lung volumes are low with bibasilar atelectasis present. There is no evidence of pulmonary edema, consolidation, pneumothorax, nodule or pleural fluid. Stable osteopenia and spondylosis of the thoracic spine.  IMPRESSION: Low volumes with bibasilar atelectasis.  No active disease.   Electronically Signed   By: Aletta Edouard M.D.   On: 02/27/2015 11:28   Ct Head Wo Contrast  02/27/2015   CLINICAL DATA:  Prior low anterior resection of rectal carcinoma in May, 2013. Recent TURBT 02/23/2015 for transitional cell carcinoma. Prior history of melanoma. Patient presents with a generalized headache which began acutely earlier today. Patient had a similar episode 2 days ago.  EXAM: CT HEAD WITHOUT CONTRAST  TECHNIQUE: Contiguous axial images were obtained  from the base of the skull through the vertex without intravenous contrast.  COMPARISON:  None.  FINDINGS: Ventricular system normal in size and appearance for age. No mass lesion. No midline shift. No acute hemorrhage or hematoma. No extra-axial fluid collections. No evidence of acute infarction. No focal brain parenchymal abnormality.  No skull fracture or other focal osseous abnormality involving the skull. Visualized paranasal sinuses, bilateral mastoid air cells and bilateral middle ear cavities well-aerated. Minimal bilateral carotid siphon atherosclerosis.  IMPRESSION: Normal intracranially.   Electronically Signed   By: Evangeline Dakin M.D.   On: 02/27/2015 11:15    I independently reviewed the above imaging studies.  Impression/Recommendation:  64 year old gentleman with history of metastatic rectal cancer with bladder involvement status post recent TURBT and now admitted with STEMI s/p PCI with drug-eluting stent on tripple anti-coagulation with mild clot passage. No voiding symptoms, PVR 75. Voiding without difficulty  this evening.  Recommendation: -No evidence of urinary clot retention -Have nursing rack and record urine for observation  -If develops difficulty voiding and PVR >500 obtain stat renal ultrasound to evaluate for bladder clot and contact urology -Per cardiology patient must remain on anticoagulation given drug-eluting stent. From a urologic perspective it is OK for him to be on anticoagulation. -Urology follow-up as previously scheduled -Please contact us if issues with urinary retention or worsening hematuria arise  Discussed with Dr. Rozanna Boer 02/28/2015, 6:28 PM

## 2015-02-28 NOTE — Progress Notes (Signed)
ANTICOAGULATION CONSULT NOTE - Follow Up Consult  Pharmacy Consult for warfarin Indication: h/o PE  No Known Allergies  Patient Measurements: Height: 5\' 11"  (180.3 cm) Weight: (!) 376 lb 15.8 oz (171 kg) IBW/kg (Calculated) : 75.3  Vital Signs: Temp: 98.2 F (36.8 C) (07/25 0700) Temp Source: Oral (07/25 0700) BP: 147/40 mmHg (07/25 0916) Pulse Rate: 84 (07/25 0916)  Labs:  Recent Labs  02/27/15 0945 02/27/15 1555 02/27/15 2109 02/28/15 0216  HGB 9.5* 9.1*  --  9.1*  HCT 30.5* 28.6*  --  28.1*  PLT 203 192  --  183  LABPROT 15.0  --   --  14.9  INR 1.17  --   --  1.15  CREATININE 1.61* 1.52*  --  1.47*  TROPONINI 0.14* >65.00* >65.00* >65.00*    Estimated Creatinine Clearance: 81.6 mL/min (by C-G formula based on Cr of 1.47).   Medications:  Prescriptions prior to admission  Medication Sig Dispense Refill Last Dose  . acetaminophen (TYLENOL) 500 MG tablet Take 1,000 mg by mouth every 6 (six) hours as needed for headache.   Past Week at Unknown time  . aspirin EC 81 MG tablet Take 81 mg by mouth every morning.    02/27/2015 at Unknown time  . atenolol (TENORMIN) 100 MG tablet Take 100 mg by mouth every evening.    02/26/2015 at 1800  . Cholecalciferol (VITAMIN D3) 5000 UNITS TABS Take 1 capsule by mouth at bedtime.    02/26/2015 at Unknown time  . doxazosin (CARDURA) 2 MG tablet Take 2 mg by mouth at bedtime.   02/26/2015 at Unknown time  . ezetimibe (ZETIA) 10 MG tablet Take 10 mg by mouth at bedtime.    02/26/2015 at Unknown time  . furosemide (LASIX) 20 MG tablet Take 10 mg by mouth at bedtime.    Past Month at Unknown time  . HYDROcodone-acetaminophen (NORCO/VICODIN) 5-325 MG per tablet Take 1 tablet by mouth every 6 (six) hours as needed for moderate pain.   Past Week at Unknown time  . levothyroxine (SYNTHROID, LEVOTHROID) 88 MCG tablet Take 88 mcg by mouth daily before breakfast.   02/27/2015 at Unknown time  . nitroGLYCERIN (NITROSTAT) 0.4 MG SL tablet Place 0.4 mg  under the tongue every 5 (five) minutes as needed for chest pain.   02/27/2015 at Unknown time  . simvastatin (ZOCOR) 40 MG tablet Take 40 mg by mouth every evening.   02/26/2015 at Unknown time  . warfarin (COUMADIN) 5 MG tablet TAKE AS DIRECTED BY ANTICOAGULATION CLINIC (Patient taking differently: Take 2.5-5 mg by mouth daily. Takes 1 tablet everyday except for Saturday takes 1/2 tablet) 90 tablet 1 02/26/2015 at Unknown time    Assessment: 37 YOM with h/o HTN and PE on coumadin presented with severe headaches and left sided chest pain radiating to the left arm and jaw. Troponin was positive in the ED, sent to cath lab, s/p DES x1 to marginal branch. Pt had stopped taking warfarin for procedure PTA and restarted warfarin regimen on 7/23.   Pharmacy consulted to restart warfarin.   Anticoagulation h/o PE, anemia INR 1.17 on admit - pt held warfarin for procedure and restarted 7/23 Current INR 1.15, H/H  stable, plt WNL  Warfarin restarted 7/24 @ 1800, no bridge  INR subtherapeutic d/t holding doses for procedure PTA and expected in trend up in next few days PTA warfarin dose 5 mg [id number], 2.5 mg Saturday Monitor CBC/bleeding closely, on triple therapy  Goal of Therapy:  INR  2-3 Monitor platelets by anticoagulation protocol: Yes   Plan:  - Warfarin 5 mg @ 1800 tonight - Monitor INR, CBC daily - Monitor s/sx bleeding closely, pt on triple therapy  Wynelle Fanny 02/28/2015,9:42 AM  Discussed and agree   Bonnita Nasuti Pharm.D. CPP, BCPS Clinical Pharmacist [phone] 02/28/2015 4:58 PM

## 2015-02-28 NOTE — Telephone Encounter (Signed)
Wife Judeth Porch "He is in [facility] after having a massive heart attack.  Appointments this week need to be cancelled.  My return number is [phone]."  Observed currently admitted to room [facility].

## 2015-02-28 NOTE — Care Management Note (Signed)
Adm w mi. Lives w fam, pcp dr Benjamine Mola dewey. Ur review done.

## 2015-02-28 NOTE — Progress Notes (Signed)
Pt c/o constipation. Notified Ellen Henri, [place]. New orders received. Will continue to monitor.   Ruben Reason, RN

## 2015-03-01 ENCOUNTER — Other Ambulatory Visit: Payer: Medicare Other

## 2015-03-01 ENCOUNTER — Other Ambulatory Visit: Payer: Self-pay | Admitting: *Deleted

## 2015-03-01 ENCOUNTER — Ambulatory Visit: Payer: Medicare Other | Admitting: Nurse Practitioner

## 2015-03-01 ENCOUNTER — Inpatient Hospital Stay (HOSPITAL_COMMUNITY): Payer: Medicare Other

## 2015-03-01 DIAGNOSIS — D500 Iron deficiency anemia secondary to blood loss (chronic): Secondary | ICD-10-CM

## 2015-03-01 DIAGNOSIS — C20 Malignant neoplasm of rectum: Secondary | ICD-10-CM

## 2015-03-01 DIAGNOSIS — I2129 ST elevation (STEMI) myocardial infarction involving other sites: Principal | ICD-10-CM

## 2015-03-01 DIAGNOSIS — I213 ST elevation (STEMI) myocardial infarction of unspecified site: Secondary | ICD-10-CM

## 2015-03-01 DIAGNOSIS — E039 Hypothyroidism, unspecified: Secondary | ICD-10-CM

## 2015-03-01 DIAGNOSIS — R310 Gross hematuria: Secondary | ICD-10-CM

## 2015-03-01 DIAGNOSIS — R079 Chest pain, unspecified: Secondary | ICD-10-CM

## 2015-03-01 LAB — CBC
HCT: 27.9 % — ABNORMAL LOW (ref 39.0–52.0)
Hemoglobin: 8.9 g/dL — ABNORMAL LOW (ref 13.0–17.0)
MCH: 26.2 pg (ref 26.0–34.0)
MCHC: 31.9 g/dL (ref 30.0–36.0)
MCV: 82.1 fL (ref 78.0–100.0)
Platelets: 177 10*3/uL (ref 150–400)
RBC: 3.4 MIL/uL — ABNORMAL LOW (ref 4.22–5.81)
RDW: 14.6 % (ref 11.5–15.5)
WBC: 5.7 10*3/uL (ref 4.0–10.5)

## 2015-03-01 LAB — BASIC METABOLIC PANEL
Anion gap: 11 (ref 5–15)
BUN: 22 mg/dL — AB (ref 6–20)
CHLORIDE: 104 mmol/L (ref 101–111)
CO2: 22 mmol/L (ref 22–32)
CREATININE: 1.5 mg/dL — AB (ref 0.61–1.24)
Calcium: 8.4 mg/dL — ABNORMAL LOW (ref 8.9–10.3)
GFR calc non Af Amer: 47 mL/min — ABNORMAL LOW (ref >60)
GFR, EST AFRICAN AMERICAN: 55 mL/min — AB (ref >60)
Glucose, Bld: 132 mg/dL — ABNORMAL HIGH (ref 65–99)
POTASSIUM: 3.8 mmol/L (ref 3.5–5.1)
Sodium: 137 mmol/L (ref 135–145)

## 2015-03-01 LAB — PROTIME-INR
INR: 1.18 (ref 0.00–1.49)
Prothrombin Time: 15.1 seconds (ref 11.6–15.2)

## 2015-03-01 MED ORDER — WARFARIN SODIUM 7.5 MG PO TABS
7.5000 mg | ORAL_TABLET | Freq: Once | ORAL | Status: AC
  Administered 2015-03-01: 7.5 mg via ORAL
  Filled 2015-03-01: qty 1

## 2015-03-01 MED ORDER — PERFLUTREN LIPID MICROSPHERE
1.0000 mL | INTRAVENOUS | Status: AC | PRN
  Administered 2015-03-01: 6 mL via INTRAVENOUS
  Filled 2015-03-01: qty 10

## 2015-03-01 NOTE — Discharge Instructions (Addendum)
Information on my medicine - Coumadin   (Warfarin)  This medication education was reviewed with me or my healthcare representative as part of my discharge preparation.    Why was Coumadin prescribed for you? Coumadin was prescribed for you because you have a blood clot or a medical condition that can cause an increased risk of forming blood clots. Blood clots can cause serious health problems by blocking the flow of blood to the heart, lung, or brain. Coumadin can prevent harmful blood clots from forming. As a reminder your indication for Coumadin is:   Pulmonary Embolism Treatment  What test will check on my response to Coumadin? While on Coumadin (warfarin) you will need to have an INR test regularly to ensure that your dose is keeping you in the desired range. The INR (international normalized ratio) number is calculated from the result of the laboratory test called prothrombin time (PT).  If an INR APPOINTMENT HAS NOT ALREADY BEEN MADE FOR YOU please schedule an appointment to have this lab work done by your health care provider within 7 days. Your INR goal is usually a number between:  2 to 3 or your provider may give you a more narrow range like 2-2.5.  Ask your health care provider during an office visit what your goal INR is.  What  do you need to  know  About  COUMADIN? Take Coumadin (warfarin) exactly as prescribed by your healthcare provider about the same time each day.  DO NOT stop taking without talking to the doctor who prescribed the medication.  Stopping without other blood clot prevention medication to take the place of Coumadin may increase your risk of developing a new clot or stroke.  Get refills before you run out.  What do you do if you miss a dose? If you miss a dose, take it as soon as you remember on the same day then continue your regularly scheduled regimen the next day.  Do not take two doses of Coumadin at the same time.  Important Safety Information A possible  side effect of Coumadin (Warfarin) is an increased risk of bleeding. You should call your healthcare provider right away if you experience any of the following: ? Bleeding from an injury or your nose that does not stop. ? Unusual colored urine (red or dark brown) or unusual colored stools (red or black). ? Unusual bruising for unknown reasons. ? A serious fall or if you hit your head (even if there is no bleeding).  Some foods or medicines interact with Coumadin (warfarin) and might alter your response to warfarin. To help avoid this: ? Eat a balanced diet, maintaining a consistent amount of Vitamin K. ? Notify your provider about major diet changes you plan to make. ? Avoid alcohol or limit your intake to 1 drink for women and 2 drinks for men per day. (1 drink is 5 oz. wine, 12 oz. beer, or 1.5 oz. liquor.)  Make sure that ANY health care provider who prescribes medication for you knows that you are taking Coumadin (warfarin).  Also make sure the healthcare provider who is monitoring your Coumadin knows when you have started a new medication including herbals and non-prescription products.  Coumadin (Warfarin)  Major Drug Interactions  Increased Warfarin Effect Decreased Warfarin Effect  Alcohol (large quantities) Antibiotics (esp. Septra/Bactrim, Flagyl, Cipro) Amiodarone (Cordarone) Aspirin (ASA) Cimetidine (Tagamet) Megestrol (Megace) NSAIDs (ibuprofen, naproxen, etc.) Piroxicam (Feldene) Propafenone (Rythmol SR) Propranolol (Inderal) Isoniazid (INH) Posaconazole (Noxafil) Barbiturates (Phenobarbital) Carbamazepine (Tegretol) Chlordiazepoxide (  Librium) Cholestyramine (Questran) Griseofulvin Oral Contraceptives Rifampin Sucralfate (Carafate) Vitamin K   Coumadin (Warfarin) Major Herbal Interactions  Increased Warfarin Effect Decreased Warfarin Effect  Garlic Ginseng Ginkgo biloba Coenzyme Q10 Green tea St. Johns wort    Coumadin (Warfarin) FOOD Interactions  Eat  a consistent number of servings per week of foods HIGH in Vitamin K (1 serving =  cup)  Collards (cooked, or boiled & drained) Kale (cooked, or boiled & drained) Mustard greens (cooked, or boiled & drained) Parsley *serving size only =  cup Spinach (cooked, or boiled & drained) Swiss chard (cooked, or boiled & drained) Turnip greens (cooked, or boiled & drained)  Eat a consistent number of servings per week of foods MEDIUM-HIGH in Vitamin K (1 serving = 1 cup)  Asparagus (cooked, or boiled & drained) Broccoli (cooked, boiled & drained, or raw & chopped) Brussel sprouts (cooked, or boiled & drained) *serving size only =  cup Lettuce, raw (green leaf, endive, romaine) Spinach, raw Turnip greens, raw & chopped   These websites have more information on Coumadin (warfarin):  FailFactory.se; VeganReport.com.au;   Nitroglycerin sublingual tablets What is this medicine? NITROGLYCERIN (nye troe GLI ser in) is a type of vasodilator. It relaxes blood vessels, increasing the blood and oxygen supply to your heart. This medicine is used to relieve chest pain caused by angina. It is also used to prevent chest pain before activities like climbing stairs, going outdoors in cold weather, or sexual activity. This medicine may be used for other purposes; ask your health care provider or pharmacist if you have questions. COMMON BRAND NAME(S): Nitroquick, Nitrostat, Nitrotab What should I tell my health care provider before I take this medicine? They need to know if you have any of these conditions: -anemia -head injury, recent stroke, or bleeding in the brain -liver disease -previous heart attack -an unusual or allergic reaction to nitroglycerin, other medicines, foods, dyes, or preservatives -pregnant or trying to get pregnant -breast-feeding How should I use this medicine? Take this medicine by mouth as needed. At the first sign of an angina attack (chest pain or tightness)  place one tablet under your tongue. You can also take this medicine 5 to 10 minutes before an event likely to produce chest pain. Follow the directions on the prescription label. Let the tablet dissolve under the tongue. Do not swallow whole. Replace the dose if you accidentally swallow it. It will help if your mouth is not dry. Saliva around the tablet will help it to dissolve more quickly. Do not eat or drink, smoke or chew tobacco while a tablet is dissolving. If you are not better within 5 minutes after taking ONE dose of nitroglycerin, call 9-1-1 immediately to seek emergency medical care. Do not take more than 3 nitroglycerin tablets over 15 minutes. If you take this medicine often to relieve symptoms of angina, your doctor or health care professional may provide you with different instructions to manage your symptoms. If symptoms do not go away after following these instructions, it is important to call 9-1-1 immediately. Do not take more than 3 nitroglycerin tablets over 15 minutes. Talk to your pediatrician regarding the use of this medicine in children. Special care may be needed. Overdosage: If you think you have taken too much of this medicine contact a poison control center or emergency room at once. NOTE: This medicine is only for you. Do not share this medicine with others. What if I miss a dose? This does not apply. This medicine is  only used as needed. What may interact with this medicine? Do not take this medicine with any of the following medications: -certain migraine medicines like ergotamine and dihydroergotamine (DHE) -medicines used to treat erectile dysfunction like sildenafil, tadalafil, and vardenafil -riociguat This medicine may also interact with the following medications: -alteplase -aspirin -heparin -medicines for high blood pressure -medicines for mental depression -other medicines used to treat angina -phenothiazines like chlorpromazine, mesoridazine,  prochlorperazine, thioridazine This list may not describe all possible interactions. Give your health care provider a list of all the medicines, herbs, non-prescription drugs, or dietary supplements you use. Also tell them if you smoke, drink alcohol, or use illegal drugs. Some items may interact with your medicine. What should I watch for while using this medicine? Tell your doctor or health care professional if you feel your medicine is no longer working. Keep this medicine with you at all times. Sit or lie down when you take your medicine to prevent falling if you feel dizzy or faint after using it. Try to remain calm. This will help you to feel better faster. If you feel dizzy, take several deep breaths and lie down with your feet propped up, or bend forward with your head resting between your knees. You may get drowsy or dizzy. Do not drive, use machinery, or do anything that needs mental alertness until you know how this drug affects you. Do not stand or sit up quickly, especially if you are an older patient. This reduces the risk of dizzy or fainting spells. Alcohol can make you more drowsy and dizzy. Avoid alcoholic drinks. Do not treat yourself for coughs, colds, or pain while you are taking this medicine without asking your doctor or health care professional for advice. Some ingredients may increase your blood pressure. What side effects may I notice from receiving this medicine? Side effects that you should report to your doctor or health care professional as soon as possible: -blurred vision -dry mouth -skin rash -sweating -the feeling of extreme pressure in the head -unusually weak or tired Side effects that usually do not require medical attention (report to your doctor or health care professional if they continue or are bothersome): -flushing of the face or neck -headache -irregular heartbeat, palpitations -nausea, vomiting This list may not describe all possible side effects. Call  your doctor for medical advice about side effects. You may report side effects to FDA at 1-800-FDA-1088. Where should I keep my medicine? Keep out of the reach of children. Store at room temperature between 20 and 25 degrees C (68 and 77 degrees F). Store in [facility] [employer]. Protect from light and moisture. Keep tightly closed. Throw away any unused medicine after the expiration date.  Atorvastatin tablets What is this medicine? ATORVASTATIN (a TORE va sta tin) is known as a HMG-CoA reductase inhibitor or 'statin'. It lowers the level of cholesterol and triglycerides in the blood. This drug may also reduce the risk of heart attack, stroke, or other health problems in patients with risk factors for heart disease. Diet and lifestyle changes are often used with this drug. This medicine may be used for other purposes; ask your health care provider or pharmacist if you have questions. COMMON BRAND NAME(S): Lipitor What should I tell my health care provider before I take this medicine? They need to know if you have any of these conditions: -frequently drink alcoholic beverages -history of stroke, TIA -kidney disease -liver disease -muscle aches or weakness -other medical condition -an unusual or allergic reaction  to atorvastatin, other medicines, foods, dyes, or preservatives -pregnant or trying to get pregnant -breast-feeding How should I use this medicine? Take this medicine by mouth with a glass of water. Follow the directions on the prescription label. You can take this medicine with or without food. Take your doses at regular intervals. Do not take your medicine more often than directed. Talk to your pediatrician regarding the use of this medicine in children. While this drug may be prescribed for children as young as 27 years old for selected conditions, precautions do apply. Overdosage: If you think you have taken too much of this medicine contact a poison control center or emergency room  at once. NOTE: This medicine is only for you. Do not share this medicine with others. What if I miss a dose? If you miss a dose, take it as soon as you can. If it is almost time for your next dose, take only that dose. Do not take double or extra doses. What may interact with this medicine? Do not take this medicine with any of the following medications: -red yeast rice -telaprevir -telithromycin -voriconazole This medicine may also interact with the following medications: -alcohol -antiviral medicines for HIV or AIDS -boceprevir -certain antibiotics like clarithromycin, erythromycin, troleandomycin -certain medicines for cholesterol like fenofibrate or gemfibrozil -cimetidine -clarithromycin -colchicine -cyclosporine -digoxin -male hormones, like estrogens or progestins and birth control pills -grapefruit juice -medicines for fungal infections like fluconazole, itraconazole, ketoconazole -niacin -rifampin -spironolactone This list may not describe all possible interactions. Give your health care provider a list of all the medicines, herbs, non-prescription drugs, or dietary supplements you use. Also tell them if you smoke, drink alcohol, or use illegal drugs. Some items may interact with your medicine. What should I watch for while using this medicine? Visit your doctor or health care professional for regular check-ups. You may need regular tests to make sure your liver is working properly. Tell your doctor or health care professional right away if you get any unexplained muscle pain, tenderness, or weakness, especially if you also have a fever and tiredness. Your doctor or health care professional may tell you to stop taking this medicine if you develop muscle problems. If your muscle problems do not go away after stopping this medicine, contact your health care professional. This drug is only part of a total heart-health program. Your doctor or a dietician can suggest a  low-cholesterol and low-fat diet to help. Avoid alcohol and smoking, and keep a proper exercise schedule. Do not use this drug if you are pregnant or breast-feeding. Serious side effects to an unborn child or to an infant are possible. Talk to your doctor or pharmacist for more information. This medicine may affect blood sugar levels. If you have diabetes, check with your doctor or health care professional before you change your diet or the dose of your diabetic medicine. If you are going to have surgery tell your health care professional that you are taking this drug. What side effects may I notice from receiving this medicine? Side effects that you should report to your doctor or health care professional as soon as possible: -allergic reactions like skin rash, itching or hives, swelling of the face, lips, or tongue -dark urine -fever -joint pain -muscle cramps, pain -redness, blistering, peeling or loosening of the skin, including inside the mouth -trouble passing urine or change in the amount of urine -unusually weak or tired -yellowing of eyes or skin Side effects that usually do not require medical attention (  report to your doctor or health care professional if they continue or are bothersome): -constipation -heartburn -stomach gas, pain, upset This list may not describe all possible side effects. Call your doctor for medical advice about side effects. You may report side effects to FDA at 1-800-FDA-1088. Where should I keep my medicine? Keep out of the reach of children. Store at room temperature between 20 to 25 degrees C (68 to 77 degrees F). Throw away any unused medicine after the expiration date. NOTE: This sheet is a summary. It may not cover all possible information. If you have questions about this medicine, talk to your doctor, pharmacist, or health care provider.  2015, Elsevier/[employer] Standard. (2011-06-12 90:30:09)  NOTE: This sheet is a summary. It may not cover all  possible information. If you have questions about this medicine, talk to your doctor, pharmacist, or health care provider.  2015, Elsevier/Gold Standard. (2013-05-21 17:57:36)

## 2015-03-01 NOTE — Progress Notes (Signed)
Patient Profile: 64 y.o. male with a PMHx of morbid obesity, hypertension, pulmonary embolus, rectal cancer, and now with bladder cancer who was admitted to [facility] on 02/27/2015 for evaluation of ongoing chest discomfort, headache, and signs of an ST segment elevation myocardial infarction.  Subjective: Feels significantly better. No recurrent CP. No dyspnea. Ambulating with cardiac rehab w/o difficulty. Hematuria improving. Constipation improving. Produced a BM earlier today.   Objective: Vital signs in last 24 hours: Temp:  [97.4 F (36.3 C)-98.4 F (36.9 C)] 98.4 F (36.9 C) (07/26 0604) Pulse Rate:  [69-88] 83 (07/26 0604) Resp:  [13-25] 18 (07/26 0604) BP: (115-147)/(40-79) 129/49 mmHg (07/26 0743) SpO2:  [95 %-99 %] 97 % (07/26 0604) Weight:  [366 lb 9.6 oz (166.289 kg)] 366 lb 9.6 oz (166.289 kg) (07/26 0604)    Intake/Output from previous day: 07/25 0701 - 07/26 0700 In: 690 [P.O.:690] Out: 500 [Urine:500] Intake/Output this shift: Total I/O In: 120 [P.O.:120] Out: -   Medications Current Facility-Administered Medications  Medication Dose Route Frequency Provider Last Rate Last Dose  . acetaminophen (TYLENOL) tablet 650 mg  650 mg Oral Q4H PRN [name], MD      . antiseptic oral rinse (CPC / CETYLPYRIDINIUM CHLORIDE 0.05%) solution 7 mL  7 mL Mouth Rinse BID [name], MD   7 mL at 03/01/15 0745  . aspirin EC tablet 81 mg  81 mg Oral q morning - 10a [name], MD   81 mg at 03/01/15 0743  . atorvastatin (LIPITOR) tablet 80 mg  80 mg Oral q1800 [name], MD   80 mg at 02/28/15 1707  . clopidogrel (PLAVIX) tablet 75 mg  75 mg Oral Q breakfast [name], MD   75 mg at 03/01/15 0743  . doxazosin (CARDURA) tablet 2 mg  2 mg Oral QHS [name], MD   2 mg at 02/28/15 2055  . ezetimibe (ZETIA) tablet 10 mg  10 mg Oral QHS [name], MD   10 mg at 02/28/15 2055  . heparin injection 5,000 Units  5,000 Units Subcutaneous 3 times per day  [name], MD   5,000 Units at 03/01/15 [phone]  . levothyroxine (SYNTHROID, LEVOTHROID) tablet 88 mcg  88 mcg Oral QAC breakfast [name], MD   88 mcg at 03/01/15 0743  . metoprolol tartrate (LOPRESSOR) tablet 12.5 mg  12.5 mg Oral BID [name], MD   12.5 mg at 03/01/15 0744  . nitroGLYCERIN (NITROSTAT) SL tablet 0.4 mg  0.4 mg Sublingual Q5 Min x 3 PRN [name], MD      . ondansetron [facility]) injection 4 mg  4 mg Intravenous Q6H PRN [name], MD      . oxyCODONE-acetaminophen (PERCOCET/ROXICET) 5-325 MG per tablet 1-2 tablet  1-2 tablet Oral Q4H PRN [name], MD      . senna-docusate (Senokot-S) tablet 1 tablet  1 tablet Oral BID Consuelo Pandy, PA-C   1 tablet at 03/01/15 0743  . sodium chloride 0.9 % injection 3 mL  3 mL Intravenous PRN [name], MD      . Warfarin - Pharmacist Dosing Inpatient   Does not apply q1800 Rebecka Apley, [facility]        PE: General appearance: alert, cooperative and no distress Neck: no carotid bruit and no JVD Lungs: clear to auscultation bilaterally Heart: regular rate and rhythm, S1, S2 normal, no murmur, click, rub or gallop Extremities: trace LEE Pulses: 2+ and symmetric Skin: warm and dry Neurologic: Grossly  normal  Lab Results:   Recent Labs  02/28/15 0216 02/28/15 1955 03/01/15 0435  WBC 6.1 7.0 5.7  HGB 9.1* 9.5* 8.9*  HCT 28.1* 30.0* 27.9*  PLT 183 211 177   BMET  Recent Labs  02/27/15 0945 02/27/15 1555 02/28/15 0216 03/01/15 0435  NA 142  --  141 137  K 4.3  --  4.3 3.8  CL 110  --  109 104  CO2 24  --  25 22  GLUCOSE 172*  --  111* 132*  BUN 29*  --  24* 22*  CREATININE 1.61* 1.52* 1.47* 1.50*  CALCIUM 8.6*  --  8.6* 8.4*   PT/INR  Recent Labs  02/27/15 0945 02/28/15 0216 03/01/15 0435  LABPROT 15.0 14.9 15.1  INR 1.17 1.15 1.18   Cholesterol  Recent Labs  02/28/15 0216  CHOL 107   Cardiac Panel (last 3 results)  Recent Labs  02/27/15 1555 02/27/15 2109  02/28/15 0216  TROPONINI >65.00* >65.00* >65.00*    Studies/Results: [facility] 02/27/15  Conclusion    1. Prox LAD lesion, 50% stenosed. 2. 1st Mrg lesion, 100% stenosed. There is a 0% residual stenosis post intervention. 3. A drug-eluting stent was placed. 4. Prox RCA lesion, 60% stenosed. 5. Mid RCA lesion, 50% stenosed. 6. Dist RCA lesion, 40% stenosed.      Assessment/Plan    Active Problems:   Acute MI, true posterior wall, initial episode of care   Acute MI inferior posterior subsequent episode care   1. Acute Posterior Wall MI: total occulusion of 1st OM s/p PCI + DES. Also with residual moderate diffuse 3V CAD to be treated medically. No recurrent CP. Continue triple therapy, ASA, Plavix and Warfarin unit INR is therapeutic, then continue Plavix and Warfarin only.  Continue high dose statin and BB for secondary prevention.   2. Acute on chronic kidney disease, stage 2: renal function stable post cath.  3. HTN: BP well controlled.   4. H/O PE: continue Warfarin. Continue ASA until INR therapeutic. INR now at 1.18.    5. Bladder Cancer: on antiplatelet therapy for recently placed DES. No surgery until cleared by MD (at least 3 months).   6. Hematuria: patient passed blood clot in urine, approximate size of penny, last PM. Per patient, this is a common occurrence when constipated. UOP ok. No urinary retention based on bladder scan which showed only 75 ml of urine. Seen by urology resident last PM. Ok to continue anticoagulation. No further w/u at this point. Urology f/u as previously scheduled.   7. Constipation: Improved. + BM earlier today. PRN meds ordered. Ambulate.   8. Anemia: H/H down since yesterday 9.5/30-->8.9-->27. Monitor closely given triple therapy (ASA + Plavix for newly placed DES and Warfarin for h/o PE). Will continue ASA until INR is therapeutic, if he can tolerate. INR today remains subtherapeutic at 1.18.    LOS: 2 days    Brittainy M. Ladoris Gene 03/01/2015 8:35 AM  I have personally seen and examined this patient with Lyda Jester, PA-C. I agree with the assessment and plan as outlined above. He is stable today post STEMI. DES placed in OM1 on 02/27/15. He is having his echo now. Renal function is stable. H/H down slightly. Will continue coumadin. I do not think the plan is for a therapeutic INR before discharge as he is on this for history of DVT/PE. Likely discharge in am.   Shamond Skelton 03/01/2015 9:54 AM

## 2015-03-01 NOTE — Progress Notes (Signed)
CARDIAC REHAB PHASE I   PRE:  Rate/Rhythm: 86 sR  BP:  Sitting: 131/55        SaO2: 96 rA  MODE:  Ambulation: 300 ft   POST:  Rate/Rhythm: 104 sT  BP:  Sitting: 145/86         SaO2: 96 RA  Pt ambulated 300 ft on RA, independent, steady gait, tolerated well. Pt c/o mild DOE, denies CP, dizziness, declined rest stop. Completed MI/stent education.  Reviewed risk factors, ntg, exercise, heart healthy diet,  portion control and phase 2 cardiac rehab. Pt verbalized understanding. Pt agrees to phase 2 cardiac rehab but states he has "a lot going on with his new cancer diagnosis and will have to see." Pt agreeable to referral, will send to [facility].  Pt to bed per pt request after walk, call bell within reach.    [phone]   Lenna Sciara, RN, BSN 03/01/2015 2:46 PM

## 2015-03-01 NOTE — Progress Notes (Signed)
IP PROGRESS NOTE  Subjective:   [name] is scheduled for an appointment at the Cancer center today. He is now admitted with a myocardial infarction. He underwent a cystoscopy and transurethral resection of a bladder tumor on 02/23/2015. A 3 cm sessile tumor was noted on the anterior wall of the bladder the dome. The area of resection was fulgurated. The pathology confirmed adenocarcinoma consistent with a primary colorectal adenocarcinoma.  [name] was diagnosed with an acute posterior wall myocardial infarction when he presented on 02/27/2015. He underwent a cardiac catheterization with a PCI and DES. He is now maintained on endocrine relation therapy with aspirin, Plavix, and Coumadin.  He denies recurrent chest pain. No hematuria at present.  Objective: Vital signs in last 24 hours: Blood pressure 115/53, pulse 83, temperature 98.4 F (36.9 C), temperature source Oral, resp. rate 18, height 5\' 11"  (1.803 m), weight 366 lb 9.6 oz (166.289 kg), SpO2 97 %.  Intake/Output from previous day: 07/25 0701 - 07/26 0700 In: 13 [P.O.:690] Out: 500 [Urine:500]  Physical Exam:  HEENT: Neck without mass Lungs: Clear anteriorly Cardiac: Regular rate and rhythm Abdomen: No hepatomegaly, nontender Extremities: Chronic stasis change at the lower leg bilaterally Lymph nodes: No cervical, supraclavicular, left axillary, or inguinal nodes. Soft 1-2cm right axillary node versus a prominent fat pad    Lab Results:  Recent Labs  02/28/15 1955 03/01/15 0435  WBC 7.0 5.7  HGB 9.5* 8.9*  HCT 30.0* 27.9*  PLT 211 177    BMET  Recent Labs  02/28/15 0216 03/01/15 0435  NA 141 137  K 4.3 3.8  CL 109 104  CO2 25 22  GLUCOSE 111* 132*  BUN 24* 22*  CREATININE 1.47* 1.50*  CALCIUM 8.6* 8.4*    Studies/Results: Dg Chest 2 View  02/27/2015   CLINICAL DATA:  Chest pain and shortness of breath. History of rectal carcinoma.  EXAM: CHEST - 2 VIEW  COMPARISON:  09/13/2014   FINDINGS: Stable cardiac enlargement. Lung volumes are low with bibasilar atelectasis present. There is no evidence of pulmonary edema, consolidation, pneumothorax, nodule or pleural fluid. Stable osteopenia and spondylosis of the thoracic spine.  IMPRESSION: Low volumes with bibasilar atelectasis.  No active disease.   Electronically Signed   By: Richard Warner M.D.   On: 02/27/2015 11:28   Ct Head Wo Contrast  02/27/2015   CLINICAL DATA:  Prior low anterior resection of rectal carcinoma in May, 2013. Recent TURBT 02/23/2015 for transitional cell carcinoma. Prior history of melanoma. Patient presents with a generalized headache which began acutely earlier today. Patient had a similar episode 2 days ago.  EXAM: CT HEAD WITHOUT CONTRAST  TECHNIQUE: Contiguous axial images were obtained from the base of the skull through the vertex without intravenous contrast.  COMPARISON:  None.  FINDINGS: Ventricular system normal in size and appearance for age. No mass lesion. No midline shift. No acute hemorrhage or hematoma. No extra-axial fluid collections. No evidence of acute infarction. No focal brain parenchymal abnormality.  No skull fracture or other focal osseous abnormality involving the skull. Visualized paranasal sinuses, bilateral mastoid air cells and bilateral middle ear cavities well-aerated. Minimal bilateral carotid siphon atherosclerosis.  IMPRESSION: Normal intracranially.   Electronically Signed   By: Richard Warner M.D.   On: 02/27/2015 11:15    Medications: I have reviewed the patient's current medications.  Assessment/Plan:  1. Rectal cancer, stage II (pT3 pN0) status post low anterior resection 12/14/2011. The tumor was noted to be at 10 cm  on a rigid proctoscopy at the time of surgery. He completed radiation and Xeloda from 01/31/2012-03/11/2012. He began a first cycle of adjuvant Xeloda on 04/22/2012. He completed cycle 5 beginning 07/17/2012. He completed cycle 6 beginning  08/08/2012.  Surveillance colonoscopy 05/15/2013, status post removal of tubular adenomas. Next colonoscopy 3 years.  Elevated CEA 06/18/2014  PET scan 06/30/2014 with mild focal hypermetabolism at the anorectal junction favored to be physiologic in the absence of associated CT abnormality; irregular bladder wall thickening.  Colonoscopy 07/27/2014 with removal of a sessile ascending colon polyp-tubular adenoma, no other significant findings  PET scan 12/27/2014: status post low anterior resection. No evidence of hypermetabolic recurrent or metastatic disease. Similar hypermetabolism about the anus.  Lower endoscopic ultrasound 01/06/2015 with findings of nonspecific 4-5 mm thickening of the rectal wall just distal to the LAR anastomosis. Fine-needle aspiration was negative for malignant cells. 2. History of iron deficiency anemia likely secondary to rectal bleeding.  3. 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 He is maintained on Coumadin.  4. Renal insufficiency.  5. Hypertension. 6. Chronic mild thrombocytopenia 7. Hypothyroid. 8. Microscopic hematuria on a urinalysis 07/02/2014  Evaluated by Richard Warner including a cystoscopic biopsy of an erythematous/edematous area of the bladder on 12/03/2014-negative for malignancy  Repeat cystoscopy confirmed a 3 cm anterior bladder mass, status post cystoscopic resection 02/23/2015 with the pathology confirming metastatic colorectal adenocarcinoma 9. Right anterior chest wall skin lesion/mass status post excision 09/20/2014. 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 Margins not involved. No evidence of angiolymphatic invasion or perineural invasion. 10. Gross hematuria, intermittent since bladder biopsy 12/03/2014. 11. Admission 02/27/2015 with an acute myocardial infarction, status post a PCI and DES of first marginal lesion 12. Anemia secondary to gross hematuria  Richard Warner has  been diagnosed with metastatic rectal cancer. I discussed the diagnosis and treatment options with Richard Warner. He can be considered for resection of the bladder recurrence since there is no additional evidence of distant metastatic disease. However he is not an optimal surgical candidate with the myocardial infarction, morbid obesity, and history of venous thromboembolic disease.  I will present his case at the GI tumor conference and consider making a referral to the surgical oncology service at [facility]. If he is not a surgical candidate we will consider radiation to the bladder with concurrent chemotherapy. There are multiple systemic treatment options. We will send his bladder tumor for mutation testing.  Richard Warner will be scheduled for a follow-up appointment at the [facility] in 2-3 weeks.  LOS: 2 days   Ramel Tobon, Dominica Severin  03/01/2015, 6:56 AM

## 2015-03-01 NOTE — Clinical Documentation Improvement (Signed)
Would you please help clarify the medical condition related to the findings?  Acute Renal Failure/Acute Kidney Injury Acute Tubular Necrosis Acute Renal Cortical Necrosis Acute Renal Medullary Necrosis Acute on Chronic Renal Failure Chronic Renal Failure Other Condition Cannot Clinically Determine   BUN - 29, 24, 22.  Creatinine - 1.61, 1.52, 1.47, 1.5.  GFR - 44, 47, 49, 47.  Thank You, Margretta Sidle ,RN Clinical Documentation Specialist:    [place] [facility]

## 2015-03-01 NOTE — Progress Notes (Signed)
Echocardiogram 2D Echocardiogram with Definity has been performed.  Richard Warner 03/01/2015, 10:20 AM

## 2015-03-01 NOTE — Clinical Documentation Improvement (Signed)
Would you please specify type of CKD?  _______CKD Stage I - GFR > OR = 90 _______CKD Stage II - GFR 60-80 _______CKD Stage III - GFR 30-59 _______CKD Stage IV - GFR 15-29 _______CKD Stage V - GFR < 15 _______ESRD (End Stage Renal Disease) _______Other condition_____________ _______Cannot Clinically determine   BUN - 29, 24, 22.  Creatinine - 1.61, 1.52, 1.47, 1.5.  GFR - 44, 47, 49, 47.   Thank You, Margretta Sidle ,RN Clinical Documentation Specialist:    [place] [facility] [phone] Cell - [phone]

## 2015-03-01 NOTE — Care Management Important Message (Signed)
Important Message  Patient Details  Name: Richard Warner MRN: [id number] Date of Birth: 1951/02/13   Medicare Important Message Given:  [facility] notification given    Nathen May 03/01/2015, 2:11 [place] Message  Patient Details  Name: Richard Warner MRN: [id number] Date of Birth: [birth year]   Medicare Important Message Given:  Yes-second notification given    Nathen May 03/01/2015, 2:11 PM

## 2015-03-01 NOTE — Progress Notes (Signed)
ANTICOAGULATION CONSULT NOTE - Follow Up Consult  Pharmacy Consult for warfarin Indication: Hx of PE  No Known Allergies  Patient Measurements: Height: 5\' 11"  (180.3 cm) Weight: (!) 366 lb 9.6 oz (166.289 kg) IBW/kg (Calculated) : 75.3   Vital Signs: Temp: 98.4 F (36.9 C) (07/26 0604) Temp Source: Oral (07/26 0604) BP: 129/49 mmHg (07/26 0743) Pulse Rate: 83 (07/26 0604)  Labs:  Recent Labs  02/27/15 0945 02/27/15 1555 02/27/15 2109 02/28/15 0216 02/28/15 1955 03/01/15 0435  HGB 9.5* 9.1*  --  9.1* 9.5* 8.9*  HCT 30.5* 28.6*  --  28.1* 30.0* 27.9*  PLT 203 192  --  183 211 177  LABPROT 15.0  --   --  14.9  --  15.1  INR 1.17  --   --  1.15  --  1.18  CREATININE 1.61* 1.52*  --  1.47*  --  1.50*  TROPONINI 0.14* >65.00* >65.00* >65.00*  --   --     Estimated Creatinine Clearance: 78.6 mL/min (by C-G formula based on Cr of 1.5).  Assessment: 62 YOM admitted 02/27/2015 with total occulusion of 1st OM s/p PCI + DES. PMH: HTN, HLD, PE, Hypothyroidism, Anemia, Rectal cancer, CKD Stage 2, Morbid obesity  Pharmacy consulted to restart warfarin (pt on PTA). INR is sub-therapeutic at 1.17 because patient had stopped taking Coumadin for a procedure PTA and restarted it 7/23, w/o bridge. INR 1.17>1.15>1.18, H/H stable (hx of anemia), Plt stable, Hematuria noted upon admission, improving. MD ok to continue anticoagulation. Pt continues on sq heparin and asa 81mg . Plan to stop asa once INR therapeutic.  PTA warfarin dose 5 mg MTWThFSun, 2.5 mg Saturday  Goal of Therapy:  INR 2-3 Monitor platelets by anticoagulation protocol: Yes   Plan:  -Give warfarin 7.5mg  x1 -Continue asa and heparin sq until INR therapeutic -Monitor daily INR/CBC -Monitor s/s bleeding closely, pt on triple therapy  Dimitri Ped, PharmD. Clinical Pharmacist Resident Pager: [phone]

## 2015-03-02 LAB — CBC
HEMATOCRIT: 29.3 % — AB (ref 39.0–52.0)
Hemoglobin: 9.3 g/dL — ABNORMAL LOW (ref 13.0–17.0)
MCH: 25.7 pg — AB (ref 26.0–34.0)
MCHC: 31.7 g/dL (ref 30.0–36.0)
MCV: 80.9 fL (ref 78.0–100.0)
Platelets: 186 10*3/uL (ref 150–400)
RBC: 3.62 MIL/uL — AB (ref 4.22–5.81)
RDW: 14.6 % (ref 11.5–15.5)
WBC: 5.5 10*3/uL (ref 4.0–10.5)

## 2015-03-02 LAB — BASIC METABOLIC PANEL
Anion gap: 8 (ref 5–15)
BUN: 20 mg/dL (ref 6–20)
CALCIUM: 8.6 mg/dL — AB (ref 8.9–10.3)
CO2: 21 mmol/L — ABNORMAL LOW (ref 22–32)
CREATININE: 1.6 mg/dL — AB (ref 0.61–1.24)
Chloride: 106 mmol/L (ref 101–111)
GFR calc Af Amer: 51 mL/min — ABNORMAL LOW (ref >60)
GFR, EST NON AFRICAN AMERICAN: 44 mL/min — AB (ref >60)
Glucose, Bld: 145 mg/dL — ABNORMAL HIGH (ref 65–99)
Potassium: 3.9 mmol/L (ref 3.5–5.1)
Sodium: 135 mmol/L (ref 135–145)

## 2015-03-02 LAB — PROTIME-INR
INR: 1.25 (ref 0.00–1.49)
Prothrombin Time: 15.9 seconds — ABNORMAL HIGH (ref 11.6–15.2)

## 2015-03-02 MED ORDER — NITROGLYCERIN 0.4 MG SL SUBL: 0.4000 mg | SUBLINGUAL_TABLET | SUBLINGUAL | Status: DC | PRN

## 2015-03-02 MED ORDER — ATORVASTATIN CALCIUM 80 MG PO TABS: 80.0000 mg | ORAL_TABLET | Freq: Every day | ORAL | Status: DC

## 2015-03-02 MED ORDER — METOPROLOL TARTRATE 25 MG PO TABS: 12.5000 mg | ORAL_TABLET | Freq: Two times a day (BID) | ORAL | Status: DC

## 2015-03-02 MED ORDER — WARFARIN SODIUM 7.5 MG PO TABS: 7.5000 mg | ORAL_TABLET | Freq: Once | ORAL | Status: DC

## 2015-03-02 MED ORDER — CLOPIDOGREL BISULFATE 75 MG PO TABS: 75.0000 mg | ORAL_TABLET | Freq: Every day | ORAL | Status: DC

## 2015-03-02 NOTE — Progress Notes (Signed)
ANTICOAGULATION CONSULT NOTE - Follow Up Consult  Pharmacy Consult for warfarin Indication: Hx of PE  No Known Allergies  Patient Measurements: Height: 5\' 11"  (180.3 cm) Weight: (!) 362 lb 9.6 oz (164.474 kg) IBW/kg (Calculated) : 75.3   Vital Signs: Temp: 97.8 F (36.6 C) (07/27 0540) Temp Source: Oral (07/27 0540) BP: 129/83 mmHg (07/27 0540) Pulse Rate: 75 (07/27 0540)  Labs:  Recent Labs  02/27/15 1555 02/27/15 2109 02/28/15 0216 02/28/15 1955 03/01/15 0435 03/02/15 0514  HGB 9.1*  --  9.1* 9.5* 8.9* 9.3*  HCT 28.6*  --  28.1* 30.0* 27.9* 29.3*  PLT 192  --  183 211 177 186  LABPROT  --   --  14.9  --  15.1 15.9*  INR  --   --  1.15  --  1.18 1.25  CREATININE 1.52*  --  1.47*  --  1.50* 1.60*  TROPONINI >65.00* >65.00* >65.00*  --   --   --     Estimated Creatinine Clearance: 73.2 mL/min (by C-G formula based on Cr of 1.6).  Assessment: 40 YOM admitted 02/27/2015 with total occulusion of 1st OM s/p PCI + DES. PMH: HTN, HLD, PE, Hypothyroidism, Anemia, Rectal cancer, CKD Stage 2, Morbid obesity  Pharmacy consulted to restart warfarin (pt on PTA). INR is sub-therapeutic at 1.17 because patient had stopped taking Coumadin for a procedure PTA and restarted it 7/23, w/o bridge. INR 1.25, H/H stable (hx of anemia), Plt stable, Hematuria noted upon admission, pt passed blood clot in urine. MD ok to continue anticoagulation.   Pt continues on sq heparin and asa 81mg . No s/sx of bleeding noted.  PTA warfarin dose 5 mg MTWThFSun, 2.5 mg Saturday  Goal of Therapy:  INR 2-3 Monitor platelets by anticoagulation protocol: Yes   Plan:  -Give warfarin 7.5mg  x1 -Continue asa and heparin sq until INR therapeutic -Monitor daily INR/CBC -Monitor s/s bleeding closely, pt on triple therapy  Joya San, PharmD Clinical Pharmacist Pager # [phone] 03/02/2015 10:45 AM

## 2015-03-02 NOTE — Discharge Summary (Signed)
Physician Discharge Summary  Patient ID: Richard Warner MRN: [id number] DOB/AGE: August 06, 1951 64 y.o.   Primary Cardiologist: Dr. Burt Knack  Admit date: 02/27/2015 Discharge date: 03/02/2015  Admission Diagnoses: Posterior STEMI  Discharge Diagnoses:  Active Problems:   Acute MI, true posterior wall, initial episode of care   Acute MI inferior posterior subsequent episode care   Discharged Condition: stable  Hospital Course: 64 y.o. male with a PMHx of morbid obesity, hypertension, pulmonary embolus on warfarin, rectal cancer, and now with bladder cancer who was admitted to [facility] on 02/27/2015 for evaluation of ongoing chest discomfort  and signs of an ST segment elevation myocardial infarction. Code STEMI was activated and he was taken urgently to the cath lab for revascularization. The procedure was performed by Dr. Burt Knack. He was found to have total occulusion of the 1st OM and underwent successful PCI + DES. He also had residual moderate diffuse 3V CAD to be treated medically (see angiographic details below). EF was normal on cath and by 2D echo at 50-55%. He tolerated the procedure well and left the cath lab in stable condition. His chest pain resolved and he had no further symptoms. He was placed ASA and Plavix. Warfarin was continued given his h/o of PE. Decision was made to continue triple therapy until his INR reached therapeutic range. At that point, ASA will be discontinued and he will continue Plavix + warfarin. He was also placed on high dose statin therapy and BB therapy. He had no post cath complications. He ambulated with cardiac rehab w/o symptoms. He was last seen and examined by Dr. Burt Knack who determined he was stable for discharge home. Any surgery for his bladder cancer will have be be delayed until he is cleared by Dr. Burt Knack. He will need to continue uninterrupted Plavix therapy for at least 3 months given newly placed DES. His INR will be followed in our office and ASA will be  discontinued once INR is therapeutic. INR day of discharge was 1.25. Post hospital f/u has been arranged with Truitt Merle, NP, on 02/08/15.    Consults: urology  Significant Diagnostic Studies:  [facility] 02/27/15 Conclusion    1. Prox LAD lesion, 50% stenosed. 2. 1st Mrg lesion, 100% stenosed. There is a 0% residual stenosis post intervention. 3. A drug-eluting stent was placed. 4. Prox RCA lesion, 60% stenosed. 5. Mid RCA lesion, 50% stenosed. 6. Dist RCA lesion, 40% stenosed.  FINAL CONCLUSIONS:  TOTAL OCCLUSION OF THE FIRST OM, TREATED SUCCESSFULLY WITH PCI  MODERATE DIFFUSE 3 VESSEL CAD AS OUTLINED ABOVE    2D Echo 03/01/15 Study Conclusions  - Left ventricle: The cavity size was normal. Wall thickness was normal. Systolic function was normal. The estimated ejection fraction was in the range of 50% to 55%. Features are consistent with a pseudonormal left ventricular filling pattern, with concomitant abnormal relaxation and increased filling pressure (grade 2 diastolic dysfunction). - Left atrium: The atrium was mildly dilated.  Impressions:  - The echo is extremely poor quality. Accurate assessment of the valves cannot be made by this echo. The LV function can be seen to be fairly normal using definity contrast.   Treatments: See Hospital Course  Discharge Exam: Blood pressure 129/83, pulse 75, temperature 97.8 F (36.6 C), temperature source Oral, resp. rate 18, height 5\' 11"  (1.803 m), weight 362 lb 9.6 oz (164.474 kg), SpO2 97 %.   Disposition: 01-Home or Self Care      Discharge Instructions    Amb Referral to Cardiac Rehabilitation  Complete by:  As directed   Congestive Heart Failure: If diagnosis is Heart Failure, patient MUST meet each of the CMS criteria: 1. Left Ventricular Ejection Fraction </= 35% 2. NYHA class II-IV symptoms despite being on optimal heart failure therapy for at least 6 weeks. 3. Stable = have not had a recent (<6  weeks) or planned (<6 months) major cardiovascular hospitalization or procedure  Program Details: - Physician supervised classes - 1-3 classes per week over a 12-18 week period, generally for a total of 36 sessions  Physician Certification: I certify that the above Cardiac Rehabilitation treatment is medically necessary and is medically approved by me for treatment of this patient. The patient is willing and cooperative, able to ambulate and medically stable to participate in exercise rehabilitation. The participant's progress and Individualized Treatment Plan will be reviewed by the Medical Director, Cardiac Rehab staff and as indicated by the Referring/Ordering Physician.  Diagnosis:   Myocardial Infarction PCI       Diet - low sodium heart healthy    Complete by:  As directed      Increase activity slowly    Complete by:  As directed             Medication List    STOP taking these medications        atenolol 100 MG tablet  Commonly known as:  TENORMIN     simvastatin 40 MG tablet  Commonly known as:  ZOCOR      TAKE these medications        acetaminophen 500 MG tablet  Commonly known as:  TYLENOL  Take 1,000 mg by mouth every 6 (six) hours as needed for headache.     aspirin EC 81 MG tablet  Take 81 mg by mouth every morning.     atorvastatin 80 MG tablet  Commonly known as:  LIPITOR  Take 1 tablet (80 mg total) by mouth daily at 6 PM.     clopidogrel 75 MG tablet  Commonly known as:  PLAVIX  Take 1 tablet (75 mg total) by mouth daily with breakfast.     doxazosin 2 MG tablet  Commonly known as:  CARDURA  Take 2 mg by mouth at bedtime.     ezetimibe 10 MG tablet  Commonly known as:  ZETIA  Take 10 mg by mouth at bedtime.     furosemide 20 MG tablet  Commonly known as:  LASIX  Take 10 mg by mouth at bedtime.     HYDROcodone-acetaminophen 5-325 MG per tablet  Commonly known as:  NORCO/VICODIN  Take 1 tablet by mouth every 6 (six) hours as needed for  moderate pain.     levothyroxine 88 MCG tablet  Commonly known as:  SYNTHROID, LEVOTHROID  Take 88 mcg by mouth daily before breakfast.     metoprolol tartrate 25 MG tablet  Commonly known as:  LOPRESSOR  Take 0.5 tablets (12.5 mg total) by mouth 2 (two) times daily.     nitroGLYCERIN 0.4 MG SL tablet  Commonly known as:  NITROSTAT  Place 0.4 mg under the tongue every 5 (five) minutes as needed for chest pain.     Vitamin D3 5000 UNITS Tabs  Take 1 capsule by mouth at bedtime.     warfarin 5 MG tablet  Commonly known as:  COUMADIN  TAKE AS DIRECTED BY ANTICOAGULATION CLINIC       Follow-up Information    Follow up with Truitt Merle, NP On 03/11/2015.  Specialties:  Nurse Practitioner, Interventional Cardiology, Cardiology, Radiology   Why:  3:00 pm (Dr. Antionette Char PA)   Contact information:   [place] [place] [address] [phone]       TIME SPENT ON DISCHARGE, INCLUDING PHYSICIAN TIME: > 30 MINUTES  Signed: SIMMONS, BRITTAINY 03/02/2015, 12:28 PM

## 2015-03-02 NOTE — Progress Notes (Signed)
Patient Profile: 64 y.o. male with a PMHx of morbid obesity, hypertension, pulmonary embolus, rectal cancer, and now with bladder cancer who was admitted to [facility] on 02/27/2015 for evaluation of ongoing chest discomfort, headache, and signs of an ST segment elevation myocardial infarction.  Subjective: Feels significantly better. No recurrent CP. No dyspnea. Ambulating with cardiac rehab w/o difficulty. Hematuria resolved. Constipation resolved. He had another BM this am.  Objective: Vital signs in last 24 hours: Temp:  [97.8 F (36.6 C)-98.6 F (37 C)] 97.8 F (36.6 C) (07/27 0540) Pulse Rate:  [75-86] 75 (07/27 0540) Resp:  [18] 18 (07/27 0540) BP: (129-133)/(55-83) 129/83 mmHg (07/27 0540) SpO2:  [96 %-97 %] 97 % (07/27 0540) Weight:  [362 lb 9.6 oz (164.474 kg)] 362 lb 9.6 oz (164.474 kg) (07/27 0540) Last BM Date: 03/02/15  Intake/Output from previous day: 07/26 0701 - 07/27 0700 In: 1080 [P.O.:1080] Out: 1930 [Urine:1930] Intake/Output this shift: Total I/O In: -  Out: 370 [Urine:370]  Medications Current Facility-Administered Medications  Medication Dose Route Frequency Provider Last Rate Last Dose  . acetaminophen (TYLENOL) tablet 650 mg  650 mg Oral Q4H PRN [name], MD      . antiseptic oral rinse (CPC / CETYLPYRIDINIUM CHLORIDE 0.05%) solution 7 mL  7 mL Mouth Rinse BID [name], MD   7 mL at 03/02/15 0749  . aspirin EC tablet 81 mg  81 mg Oral q morning - 10a [name], MD   81 mg at 03/02/15 0749  . atorvastatin (LIPITOR) tablet 80 mg  80 mg Oral q1800 [name], MD   80 mg at 03/01/15 1737  . clopidogrel (PLAVIX) tablet 75 mg  75 mg Oral Q breakfast [name], MD   75 mg at 03/02/15 0749  . doxazosin (CARDURA) tablet 2 mg  2 mg Oral QHS [name], MD   2 mg at 03/01/15 2210  . ezetimibe (ZETIA) tablet 10 mg  10 mg Oral QHS [name], MD   10 mg at 03/01/15 2210  . heparin injection 5,000 Units  5,000 Units Subcutaneous 3  times per day [name], MD   5,000 Units at 03/02/15 0654  . levothyroxine (SYNTHROID, LEVOTHROID) tablet 88 mcg  88 mcg Oral QAC breakfast [name], MD   88 mcg at 03/02/15 0749  . metoprolol tartrate (LOPRESSOR) tablet 12.5 mg  12.5 mg Oral BID [name], MD   12.5 mg at 03/02/15 0749  . nitroGLYCERIN (NITROSTAT) SL tablet 0.4 mg  0.4 mg Sublingual Q5 Min x 3 PRN [name], MD      . ondansetron [facility]) injection 4 mg  4 mg Intravenous Q6H PRN [name], MD      . oxyCODONE-acetaminophen (PERCOCET/ROXICET) 5-325 MG per tablet 1-2 tablet  1-2 tablet Oral Q4H PRN [name], MD      . senna-docusate (Senokot-S) tablet 1 tablet  1 tablet Oral BID Consuelo Pandy, PA-C   1 tablet at 03/02/15 0749  . sodium chloride 0.9 % injection 3 mL  3 mL Intravenous PRN [name], MD      . Warfarin - Pharmacist Dosing Inpatient   Does not apply q1800 Rebecka Apley, [facility]        PE: General appearance: alert, cooperative and no distress Neck: no carotid bruit and no JVD Lungs: clear to auscultation bilaterally Heart: regular rate and rhythm, S1, S2 normal, no murmur, click, rub or gallop Extremities: trace LEE Pulses: 2+ and symmetric Skin: warm and dry  Neurologic: Grossly normal  Lab Results:   Recent Labs  02/28/15 1955 03/01/15 0435 03/02/15 0514  WBC 7.0 5.7 5.5  HGB 9.5* 8.9* 9.3*  HCT 30.0* 27.9* 29.3*  PLT 211 177 186   BMET  Recent Labs  02/28/15 0216 03/01/15 0435 03/02/15 0514  NA 141 137 135  K 4.3 3.8 3.9  CL 109 104 106  CO2 25 22 21*  GLUCOSE 111* 132* 145*  BUN 24* 22* 20  CREATININE 1.47* 1.50* 1.60*  CALCIUM 8.6* 8.4* 8.6*   PT/INR  Recent Labs  02/28/15 0216 03/01/15 0435 03/02/15 0514  LABPROT 14.9 15.1 15.9*  INR 1.15 1.18 1.25   Cholesterol  Recent Labs  02/28/15 0216  CHOL 107   Cardiac Panel (last 3 results)  Recent Labs  02/27/15 1555 02/27/15 2109 02/28/15 0216  TROPONINI >65.00* >65.00* >65.00*      Studies/Results: [facility] 02/27/15  Conclusion    1. Prox LAD lesion, 50% stenosed. 2. 1st Mrg lesion, 100% stenosed. There is a 0% residual stenosis post intervention. 3. A drug-eluting stent was placed. 4. Prox RCA lesion, 60% stenosed. 5. Mid RCA lesion, 50% stenosed. 6. Dist RCA lesion, 40% stenosed.      Assessment/Plan    Active Problems:   Acute MI, true posterior wall, initial episode of care   Acute MI inferior posterior subsequent episode care   1. Acute Posterior Wall MI: total occulusion of 1st OM s/p PCI + DES. Also with residual moderate diffuse 3V CAD to be treated medically. No recurrent CP. Continue triple therapy, ASA, Plavix and Warfarin unit INR is therapeutic, then continue Plavix and Warfarin only.  Continue high dose statin and BB for secondary prevention.   2. Acute on chronic kidney disease, stage 2: renal function stable post cath.  3. HTN: BP well controlled.   4. H/O PE: continue Warfarin. Continue ASA until INR therapeutic. INR now at 1.25.    5. Bladder Cancer: on antiplatelet therapy for recently placed DES. No surgery until cleared by MD (at least 3 months).   6. Hematuria: patient passed blood clot in urine, approximate size of penny, 2 nights ago. Per patient, this is a common occurrence when constipated. UOP ok. No urinary retention based on bladder scan which showed only 75 ml of urine. Seen by urology resident 7/25. Ok to continue anticoagulation. No further w/u at this point. Urology f/u as previously scheduled.   7. Constipation: resolved. Had another BM earlier today. PRN meds ordered. Ambulate.   8. Anemia: H/H is trending back up 8.9-->9.3.  He is currently on triple therapy (ASA + Plavix for newly placed DES and Warfarin for h/o PE). Will continue ASA until INR is therapeutic, if he can tolerate. INR today remains subtherapeutic at 1.25. Can follow INR in the office.   Dispo: possible d/c home today after MD sees.   LOS: 3 days     Brittainy M. Ladoris Gene 03/02/2015 9:27 AM   Patient seen, examined. Available data reviewed. Agree with findings, assessment, and plan as outlined by Lyda Jester, PA-C. Reviewed consult of Dr Benay Spice from yesterday and the fact that the patient now has a metastatic focus in the bladder from primary colon CA. on exam, he is alert and oriented in no distress. Lungs are clear. Heart is regular rate and rhythm. I have reviewed plans for discharge with the patient and his family. The patient has an appointment scheduled with the Coumadin clinic next Tuesday. He should resume his home dose  of Coumadin and keep that appointment. I would recommend discharging him on aspirin 81 mg, Plavix 75 mg, and Coumadin at his home dose which is 5 mg daily except for Saturday when he takes 2.5 mg. He should stop aspirin as soon as his INR is therapeutic. Otherwise should continue medications which include a high intensity statin and beta blocker. With normal LV function, I would probably avoid an ACE inhibitor right now. In the long-term he would gain some benefit considering his chronic kidney disease, and he currently has a lot of medical problems and I think his risk of acute kidney injury outweighs potential long-term benefit.  [name], M.D. 03/02/2015 11:48 AM

## 2015-03-07 ENCOUNTER — Telehealth: Payer: Self-pay | Admitting: *Deleted

## 2015-03-07 NOTE — Telephone Encounter (Signed)
SINCE Thursday THE NUMBER OF BLOOD CLOTS IN PT.'S URINE HAS INCREASED. HE IS FORCING FLUIDS SO PT. CAN PASS THE CLOTS BUT AT NIGHT WHEN HE IS TRYING TO SLEEP THE CLOTS CLUMP TOGETHER. PT. HAS PRESSURE AND HE HAS TO GET UP AND TRY TO PUSH THE CLOTS OUT. LST NIGHT PT. WAS UP AT LEAST SEVEN TIMES. VERBAL ORDER AND READ BACK TO DR.[place]. NEEDS TO CALL DR.NESI'S OFFICE AND SPEAK WITH DR.NESI'S NURSE. IF NO RESPONSE PT. TO CALL THIS OFFICE BACK. NOTIFIED PT. HE VOICES UNDERSTANDING.

## 2015-03-08 ENCOUNTER — Ambulatory Visit: Payer: Medicare Other | Admitting: Oncology

## 2015-03-08 ENCOUNTER — Other Ambulatory Visit: Payer: Medicare Other

## 2015-03-11 ENCOUNTER — Ambulatory Visit (INDEPENDENT_AMBULATORY_CARE_PROVIDER_SITE_OTHER): Payer: Medicare Other | Admitting: Nurse Practitioner

## 2015-03-11 ENCOUNTER — Encounter: Payer: Self-pay | Admitting: Nurse Practitioner

## 2015-03-11 ENCOUNTER — Inpatient Hospital Stay (HOSPITAL_COMMUNITY)
Admission: AD | Admit: 2015-03-11 | Discharge: 2015-03-19 | DRG: 812 | Disposition: A | Discharge status: Home / Self Care | Payer: Medicare Other | Source: Ambulatory Visit | Attending: Internal Medicine | Admitting: Internal Medicine

## 2015-03-11 ENCOUNTER — Other Ambulatory Visit: Payer: Self-pay | Admitting: Nurse Practitioner

## 2015-03-11 VITALS — BP 110/66 | HR 125 | Ht 71.0 in | Wt 354.4 lb

## 2015-03-11 DIAGNOSIS — C19 Malignant neoplasm of rectosigmoid junction: Secondary | ICD-10-CM | POA: Diagnosis present

## 2015-03-11 DIAGNOSIS — Z86718 Personal history of other venous thrombosis and embolism: Secondary | ICD-10-CM

## 2015-03-11 DIAGNOSIS — E559 Vitamin D deficiency, unspecified: Secondary | ICD-10-CM | POA: Diagnosis present

## 2015-03-11 DIAGNOSIS — Z79899 Other long term (current) drug therapy: Secondary | ICD-10-CM

## 2015-03-11 DIAGNOSIS — I2510 Atherosclerotic heart disease of native coronary artery without angina pectoris: Secondary | ICD-10-CM | POA: Diagnosis present

## 2015-03-11 DIAGNOSIS — I2119 ST elevation (STEMI) myocardial infarction involving other coronary artery of inferior wall: Secondary | ICD-10-CM | POA: Diagnosis present

## 2015-03-11 DIAGNOSIS — N183 Chronic kidney disease, stage 3 unspecified: Secondary | ICD-10-CM | POA: Diagnosis present

## 2015-03-11 DIAGNOSIS — C7911 Secondary malignant neoplasm of bladder: Secondary | ICD-10-CM | POA: Diagnosis present

## 2015-03-11 DIAGNOSIS — I252 Old myocardial infarction: Secondary | ICD-10-CM

## 2015-03-11 DIAGNOSIS — Z6841 Body Mass Index (BMI) 40.0 and over, adult: Secondary | ICD-10-CM

## 2015-03-11 DIAGNOSIS — C20 Malignant neoplasm of rectum: Secondary | ICD-10-CM | POA: Diagnosis present

## 2015-03-11 DIAGNOSIS — R58 Hemorrhage, not elsewhere classified: Secondary | ICD-10-CM | POA: Diagnosis present

## 2015-03-11 DIAGNOSIS — E039 Hypothyroidism, unspecified: Secondary | ICD-10-CM | POA: Diagnosis present

## 2015-03-11 DIAGNOSIS — Z7902 Long term (current) use of antithrombotics/antiplatelets: Secondary | ICD-10-CM

## 2015-03-11 DIAGNOSIS — R319 Hematuria, unspecified: Secondary | ICD-10-CM | POA: Diagnosis not present

## 2015-03-11 DIAGNOSIS — I25811 Atherosclerosis of native coronary artery of transplanted heart without angina pectoris: Secondary | ICD-10-CM

## 2015-03-11 DIAGNOSIS — Z7982 Long term (current) use of aspirin: Secondary | ICD-10-CM

## 2015-03-11 DIAGNOSIS — I2111 ST elevation (STEMI) myocardial infarction involving right coronary artery: Secondary | ICD-10-CM

## 2015-03-11 DIAGNOSIS — R310 Gross hematuria: Secondary | ICD-10-CM | POA: Diagnosis present

## 2015-03-11 DIAGNOSIS — D696 Thrombocytopenia, unspecified: Secondary | ICD-10-CM | POA: Diagnosis present

## 2015-03-11 DIAGNOSIS — I129 Hypertensive chronic kidney disease with stage 1 through stage 4 chronic kidney disease, or unspecified chronic kidney disease: Secondary | ICD-10-CM | POA: Diagnosis present

## 2015-03-11 DIAGNOSIS — E213 Hyperparathyroidism, unspecified: Secondary | ICD-10-CM | POA: Diagnosis present

## 2015-03-11 DIAGNOSIS — Z7901 Long term (current) use of anticoagulants: Secondary | ICD-10-CM

## 2015-03-11 DIAGNOSIS — Z9049 Acquired absence of other specified parts of digestive tract: Secondary | ICD-10-CM | POA: Diagnosis present

## 2015-03-11 DIAGNOSIS — Z923 Personal history of irradiation: Secondary | ICD-10-CM

## 2015-03-11 DIAGNOSIS — I2782 Chronic pulmonary embolism: Secondary | ICD-10-CM | POA: Diagnosis present

## 2015-03-11 DIAGNOSIS — N179 Acute kidney failure, unspecified: Secondary | ICD-10-CM | POA: Diagnosis present

## 2015-03-11 DIAGNOSIS — D509 Iron deficiency anemia, unspecified: Secondary | ICD-10-CM | POA: Diagnosis present

## 2015-03-11 DIAGNOSIS — D62 Acute posthemorrhagic anemia: Principal | ICD-10-CM | POA: Diagnosis present

## 2015-03-11 DIAGNOSIS — M1990 Unspecified osteoarthritis, unspecified site: Secondary | ICD-10-CM | POA: Diagnosis present

## 2015-03-11 LAB — COMPREHENSIVE METABOLIC PANEL
ALT: 15 U/L — ABNORMAL LOW (ref 17–63)
AST: 18 U/L (ref 15–41)
Albumin: 3.1 g/dL — ABNORMAL LOW (ref 3.5–5.0)
Alkaline Phosphatase: 41 U/L (ref 38–126)
Anion gap: 9 (ref 5–15)
BUN: 21 mg/dL — ABNORMAL HIGH (ref 6–20)
CO2: 21 mmol/L — ABNORMAL LOW (ref 22–32)
Calcium: 8.5 mg/dL — ABNORMAL LOW (ref 8.9–10.3)
Chloride: 105 mmol/L (ref 101–111)
Creatinine, Ser: 1.81 mg/dL — ABNORMAL HIGH (ref 0.61–1.24)
GFR calc Af Amer: 44 mL/min — ABNORMAL LOW (ref >60)
GFR calc non Af Amer: 38 mL/min — ABNORMAL LOW (ref >60)
Glucose, Bld: 130 mg/dL — ABNORMAL HIGH (ref 65–99)
Potassium: 4.1 mmol/L (ref 3.5–5.1)
Sodium: 135 mmol/L (ref 135–145)
Total Bilirubin: 0.5 mg/dL (ref 0.3–1.2)
Total Protein: 5.3 g/dL — ABNORMAL LOW (ref 6.5–8.1)

## 2015-03-11 LAB — BRAIN NATRIURETIC PEPTIDE: B Natriuretic Peptide: 58.2 pg/mL (ref 0.0–100.0)

## 2015-03-11 LAB — CBC WITH DIFFERENTIAL/PLATELET
Basophils Absolute: 0 10*3/uL (ref 0.0–0.1)
Basophils Relative: 0 % (ref 0–1)
Eosinophils Absolute: 0 10*3/uL (ref 0.0–0.7)
Eosinophils Relative: 0 % (ref 0–5)
HCT: 15.1 % — ABNORMAL LOW (ref 39.0–52.0)
Hemoglobin: 4.6 g/dL — CL (ref 13.0–17.0)
Lymphocytes Relative: 8 % — ABNORMAL LOW (ref 12–46)
Lymphs Abs: 0.4 10*3/uL — ABNORMAL LOW (ref 0.7–4.0)
MCH: 23.6 pg — ABNORMAL LOW (ref 26.0–34.0)
MCHC: 30.5 g/dL (ref 30.0–36.0)
MCV: 77.4 fL — ABNORMAL LOW (ref 78.0–100.0)
Monocytes Absolute: 0.4 10*3/uL (ref 0.1–1.0)
Monocytes Relative: 7 % (ref 3–12)
Neutro Abs: 4.4 10*3/uL (ref 1.7–7.7)
Neutrophils Relative %: 84 % — ABNORMAL HIGH (ref 43–77)
Platelets: 204 10*3/uL (ref 150–400)
RBC: 1.95 MIL/uL — ABNORMAL LOW (ref 4.22–5.81)
RDW: 15.6 % — ABNORMAL HIGH (ref 11.5–15.5)
WBC: 5.2 10*3/uL (ref 4.0–10.5)

## 2015-03-11 LAB — URINALYSIS, ROUTINE W REFLEX MICROSCOPIC
Glucose, UA: 100 mg/dL — AB
Ketones, ur: 40 mg/dL — AB
Nitrite: POSITIVE — AB
Protein, ur: >300 mg/dL — AB
Specific Gravity, Urine: 1.016 (ref 1.005–1.030)
Urobilinogen, UA: 1 mg/dL (ref 0.0–1.0)
pH: 6.5 (ref 5.0–8.0)

## 2015-03-11 LAB — URINE MICROSCOPIC-ADD ON

## 2015-03-11 LAB — PREPARE RBC (CROSSMATCH)

## 2015-03-11 LAB — PROTIME-INR
INR: 1.92 — ABNORMAL HIGH (ref 0.00–1.49)
Prothrombin Time: 21.8 seconds — ABNORMAL HIGH (ref 11.6–15.2)

## 2015-03-11 LAB — TROPONIN I: Troponin I: 0.14 ng/mL — ABNORMAL HIGH (ref <0.031)

## 2015-03-11 LAB — ABO/RH: ABO/RH(D): A POS

## 2015-03-11 LAB — TSH: TSH: 2.528 u[IU]/mL (ref 0.350–4.500)

## 2015-03-11 MED ORDER — SODIUM CHLORIDE 0.9 % IV SOLN
Freq: Once | INTRAVENOUS | Status: AC
  Administered 2015-03-11: 19:00:00 via INTRAVENOUS

## 2015-03-11 MED ORDER — SODIUM CHLORIDE 0.9 % IJ SOLN
3.0000 mL | Freq: Two times a day (BID) | INTRAMUSCULAR | Status: DC
  Administered 2015-03-11 – 2015-03-18 (×7): 3 mL via INTRAVENOUS

## 2015-03-11 MED ORDER — CLOPIDOGREL BISULFATE 75 MG PO TABS
75.0000 mg | ORAL_TABLET | Freq: Every day | ORAL | Status: DC
  Administered 2015-03-12 – 2015-03-19 (×8): 75 mg via ORAL
  Filled 2015-03-11 (×8): qty 1

## 2015-03-11 MED ORDER — CLOPIDOGREL BISULFATE 75 MG PO TABS: 75.0000 mg | ORAL_TABLET | Freq: Every day | ORAL | Status: DC

## 2015-03-11 MED ORDER — DOXAZOSIN MESYLATE 2 MG PO TABS
2.0000 mg | ORAL_TABLET | Freq: Every day | ORAL | Status: DC
  Administered 2015-03-11 – 2015-03-18 (×8): 2 mg via ORAL
  Filled 2015-03-11 (×9): qty 1

## 2015-03-11 MED ORDER — ASPIRIN EC 81 MG PO TBEC: 81.0000 mg | DELAYED_RELEASE_TABLET | Freq: Every day | ORAL | Status: DC

## 2015-03-11 MED ORDER — ATORVASTATIN CALCIUM 40 MG PO TABS
80.0000 mg | ORAL_TABLET | Freq: Every day | ORAL | Status: DC
  Administered 2015-03-11 – 2015-03-18 (×8): 80 mg via ORAL
  Filled 2015-03-11: qty 1
  Filled 2015-03-11 (×2): qty 2
  Filled 2015-03-11 (×2): qty 1
  Filled 2015-03-11: qty 2
  Filled 2015-03-11 (×2): qty 1

## 2015-03-11 MED ORDER — VITAMIN D 1000 UNITS PO TABS
1000.0000 [IU] | ORAL_TABLET | Freq: Every day | ORAL | Status: DC
  Administered 2015-03-11 – 2015-03-19 (×9): 1000 [IU] via ORAL
  Filled 2015-03-11 (×9): qty 1

## 2015-03-11 MED ORDER — FUROSEMIDE 10 MG/ML IJ SOLN
20.0000 mg | Freq: Once | INTRAMUSCULAR | Status: AC
  Administered 2015-03-11: 20 mg via INTRAVENOUS
  Filled 2015-03-11: qty 2

## 2015-03-11 MED ORDER — METOPROLOL TARTRATE 25 MG PO TABS
12.5000 mg | ORAL_TABLET | Freq: Two times a day (BID) | ORAL | Status: DC
  Administered 2015-03-11 – 2015-03-19 (×16): 12.5 mg via ORAL
  Filled 2015-03-11 (×16): qty 1

## 2015-03-11 MED ORDER — DEXTROSE-NACL 5-0.45 % IV SOLN
INTRAVENOUS | Status: DC
  Administered 2015-03-12 – 2015-03-18 (×7): via INTRAVENOUS
  Administered 2015-03-18: 75 mL/h via INTRAVENOUS

## 2015-03-11 MED ORDER — EZETIMIBE 10 MG PO TABS
10.0000 mg | ORAL_TABLET | Freq: Every day | ORAL | Status: DC
  Administered 2015-03-11 – 2015-03-19 (×9): 10 mg via ORAL
  Filled 2015-03-11 (×9): qty 1

## 2015-03-11 MED ORDER — LEVOTHYROXINE SODIUM 88 MCG PO TABS
88.0000 ug | ORAL_TABLET | Freq: Every day | ORAL | Status: DC
  Administered 2015-03-12 – 2015-03-19 (×8): 88 ug via ORAL
  Filled 2015-03-11 (×8): qty 1

## 2015-03-11 NOTE — Progress Notes (Addendum)
See full H and P.   I have personally seen and examined this patient. I agree with the assessment and plan as outlined above. He was admitted 12 days ago with a STEMI. He had a DES placed in OM1. 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 He was discharged on ASA/Plavix/coumadin. Hgb 9.3 at time of discharge. He has been passing large blood clots in his urine for the last several days. He has had progressive weakness over the last two days and can barely walk across the room now. He has no chest pain. He was seen in our office today by Truitt Merle, NP and was directly admitted to [facility] for further workup. Hgb is 4.6. INR is 1.92. EKG shows sinus tach. BP is 136/63.  -Will transfuse 2 units pRBCs tonight.  -Will hold coumadin and ASA. Will continue Plavix for now as he is only 12 days out from DES in setting of STEMI. His risk of stent thrombosis and MI off of Plavix in this early period is very high.  -I spoke to Urology on call [facility]. Will get bladder scan. If over 500 cc urine retention or pt unable to void will then need non-contrasted CT of the pelvis to evaluate for bladder thrombus. Nursing to collect urine throughout the night. If he becomes unable to void, will call Dr. Ottis Stain for hematuria catheter tonight. Full Urology consult in the am. If his clinical status changes, they will need to be called back tonight.   MCALHANY,CHRISTOPHER 03/11/2015 6:34 PM

## 2015-03-11 NOTE — Progress Notes (Signed)
CRITICAL VALUE ALERT  Critical value received:  Hemoglobin  Date of notification:  03/11/15  Time of notification:  0947  Critical value read back:Yes.    Nurse who received alert:  Magdalene River  MD notified (1st page):  Tarri Fuller, PA  Time of first page:  1755  MD notified (2nd page):  Time of second page:  Responding MD:  Tarri Fuller, PA  Time MD responded:  [phone]

## 2015-03-11 NOTE — H&P (Signed)
Richard Warner  03/11/2015 3:00 PM  Office Visit  MRN:  [id number]   Description: Male DOB: 1951/05/28  Provider: Burtis Junes, NP  Department: [facility] Office       Vital Signs  Most recent update: 03/11/2015 3:19 PM by Tamsen Snider    BP Pulse Ht Wt BMI SpO2    110/66 mmHg 125 5\' 11"  (1.803 m) 354 lb 6.4 oz (160.755 kg) 49.45 kg/m2 100%    Vitals History     Progress Notes      Burtis Junes, NP at 03/11/2015 3:10 PM     Status: Signed       Expand All Collapse All       CARDIOLOGY OFFICE NOTE  Date: 03/11/2015    Richard Warner Date of Birth: Aug 03, 1951 Medical Record #[id number]  PCP: Rachell Cipro, MD Cardiologist: Burt Knack   Chief Complaint  Patient presents with  . Coronary Artery Disease    Post hospital visit - seen for Dr. Burt Knack    History of Present Illness: Richard Warner is a 64 y.o. male who presents today for a post hospital visit. Seen for Dr. Burt Knack. He has a PMHx of morbid obesity, hypertension, pulmonary embolus on warfarin, rectal cancer, and now with bladder cancer.   He was admitted to [facility] on 02/27/2015 for evaluation of ongoing chest discomfort and signs of an ST segment elevation myocardial infarction. Code STEMI was activated and he was taken urgently to the cath lab for revascularization. The procedure was performed by Dr. Burt Knack. He was found to have total occulusion of the 1st OM and underwent successful PCI + DES. He also had residual moderate diffuse 3V CAD to be treated medically (see angiographic details below). EF was normal on cath and by 2D echo at 50-55%. He tolerated the procedure well and left the cath lab in stable condition. His chest pain resolved and he had no further symptoms. He was placed ASA and Plavix. Warfarin was continued given his h/o of PE. Decision was made to continue triple therapy until his INR reached therapeutic range. At that point, ASA will be discontinued  and he will continue Plavix + warfarin. He was also placed on high dose statin therapy and BB therapy. Any surgery for his bladder cancer will have be be delayed until he is cleared by Dr. Burt Knack. He will need to continue uninterrupted Plavix therapy for at least 3 months given newly placed DES. His INR will be followed in our office and ASA will be discontinued once INR is therapeutic. INR day of discharge was 1.25.   Comes in today. Here with his mom today. Pretty pitiful. He is quite weak. He has been short of breath. Very weak. Feels cold. Tachycardic. No chest pain however. Has started passing large clots in his urine - this has really "wore him out". Very pale. Mom notes that he can barely go from one room to the next. He has not had his coumadin checked yet. HGB at discharge 9.3. This was normal 3 months ago. Last CEA noted to be increasing. He almost went to the ER yesterday but then held off since he was coming here today.   Past Medical History  Diagnosis Date  . Hypertension   . Pulmonary embolism 12-03-11    01-17-10 s/p DVT left leg -Coumadin tx.until 08-28-11  . Hyperlipidemia   . Leg swelling 12-03-11    bilateral if up most of day,equally swells, up to knee level, improved now  .  Fractures 12-03-11    s/p right ankle and right wrist- no problems now  . Hypothyroidism   . Dyslipidemia   . Anemia 12-03-11    tx. oral iron supplement  . Arthritis 12-03-11    mild lower back  . 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 Chronic kidney disease 12-03-11    kidney stone x1 ,none recent stage III  . Secondary hyperparathyroidism, renal   . Chronic anticoagulation   . Shortness of breath 12-03-11    hx. 6'11- during pulmonary emboli episode, none  now, extreme exertion only.  . Morbid obesity   . Vitamin D deficiency   . Chronic pulmonary edema   . Dorsalgia   . Acute MI, true posterior wall, initial episode of care 02/27/2015    Past Surgical History  Procedure Laterality Date  . Colonoscopy  10/08/2011    Procedure: COLONOSCOPY; Surgeon: Inda Castle, MD; Location: [facility]; Service: Endoscopy; Laterality: N/A;  . Tonsillectomy  12-03-11    child  . Proctoscopy  12/14/2011    Procedure: PROCTOSCOPY; Surgeon: [name], MD; Location: [facility] ORS; Service: General; Laterality: N/A;  . Low anterior bowel resection  12/14/2011    Mid/proximal rectal cancer. T3 N0  . Colonoscopy N/A 05/15/2013    Procedure: COLONOSCOPY; Surgeon: Inda Castle, MD; Location: [facility]; Service: Endoscopy; Laterality: N/A;  . Colonoscopy N/A 07/27/2014    Procedure: COLONOSCOPY; Surgeon: Inda Castle, MD; Location: [facility]; Service: Endoscopy; Laterality: N/A;  . Cyst removal trunk N/A 09/17/2014    Procedure: REMOVAL OF ANTERIOR CHEST WALL SKIN MASS; Surgeon: Michael Boston, MD; Location: [facility]; Service: General; Laterality: N/A;  . Cystoscopy with biopsy N/A 12/03/2014    Procedure: CYSTOSCOPY WITH BIOPSY; Surgeon: Lowella Bandy, MD; Location: [facility]; Service: Urology; Laterality: N/A;  . Eus N/A 01/06/2015    Procedure: LOWER ENDOSCOPIC ULTRASOUND (EUS); Surgeon: [name], MD; Location: [facility]; Service: Endoscopy; Laterality: N/A;  . Transurethral resection of bladder tumor N/A 02/23/2015    Procedure: TRANSURETHRAL RESECTION OF BLADDER MASS; Surgeon: Lowella Bandy, MD; Location: [facility]; Service: Urology; Laterality: N/A;  . Cystoscopy N/A 02/23/2015    Procedure: CYSTOSCOPY; Surgeon: Lowella Bandy, MD; Location: [facility]; Service: Urology; Laterality: N/A;  . Cardiac catheterization N/A 02/27/2015     Procedure: Coronary Stent Intervention; Surgeon: [name], MD; Location: [facility]; Service: Cardiovascular; Laterality: N/A;     Medications: Current Outpatient Prescriptions  Medication Sig Dispense Refill  . aspirin EC 81 MG tablet Take 81 mg by mouth every morning.     [name] atorvastatin (LIPITOR) 80 MG tablet Take 1 tablet (80 mg total) by mouth daily at 6 PM. 30 tablet 5  . Cholecalciferol (VITAMIN D3) 5000 UNITS TABS Take 1 capsule by mouth at bedtime.     . clopidogrel (PLAVIX) 75 MG tablet Take 1 tablet (75 mg total) by mouth daily with breakfast. 30 tablet 5  . doxazosin (CARDURA) 2 MG tablet Take 2 mg by mouth at bedtime.    [name] ezetimibe (ZETIA) 10 MG tablet Take 10 mg by mouth at bedtime.     [name] levothyroxine (SYNTHROID, LEVOTHROID) 88 MCG tablet Take 88 mcg by mouth daily before breakfast.    . metoprolol tartrate (LOPRESSOR) 25 MG tablet Take 0.5 tablets (12.5 mg total) by mouth 2 (two) times daily. 60 tablet 5  .  nitroGLYCERIN (NITROSTAT) 0.4 MG SL tablet Place 1 tablet (0.4 mg total) under the tongue every 5 (five) minutes as needed for chest pain. 25 tablet 2  . warfarin (COUMADIN) 5 MG tablet TAKE AS DIRECTED BY ANTICOAGULATION CLINIC (Patient taking differently: Take 2.5-5 mg by mouth daily. Takes 1 tablet everyday except for Saturday takes 1/2 tablet) 90 tablet 1   No current facility-administered medications for this visit.    Allergies: No Known Allergies  Social History: The patient  reports that he has never smoked. He has never used smokeless tobacco. He reports that he does not drink alcohol or use illicit drugs.  Family History: The patient's family history includes Cancer in his paternal grandfather; Diabetes in his father; Heart disease in his father; Hypertension in his paternal grandfather; Lung cancer in his paternal grandfather; Stroke in his maternal grandfather and maternal  grandmother.   Review of Systems: Please see the history of present illness. Otherwise, the review of systems is positive for none. All other systems are reviewed and negative.   Physical Exam: VS: BP 110/66 mmHg  Pulse 125  Ht 5\' 11"  (1.803 m)  Wt 354 lb 6.4 oz (160.755 kg)  BMI 49.45 kg/m2  SpO2 100% . BMI Body mass index is 49.45 kg/(m^2).  Wt Readings from Last 3 Encounters:  03/11/15 354 lb 6.4 oz (160.755 kg)  03/02/15 362 lb 9.6 oz (164.474 kg)  02/23/15 366 lb (166.017 kg)    General: Pleasant. Little anxious. Morbidly obese. Very pale in appearance and short of breath with talking.  HEENT: Normal.  Neck: Supple, no JVD, carotid bruits, or masses noted.  Cardiac: He is tachycardic today. Legs are quite full with brawny stasis changes/edema.  Respiratory: Lungs are fairly clear to auscultation bilaterally withincreased work of breathing.  GI: Soft and nontender. Obese MS: No deformity or atrophy. Gait not tested. ROM intact.  Skin: Warm and dry. Color is normal.  Neuro: Strength and sensation are intact and no gross focal deficits noted.  Psych: Alert, appropriate and with normal affect.   LABORATORY DATA:  EKG: EKG is ordered today. This demonstrates sinus tachycardia.   Recent Labs    Lab Results  Component Value Date   WBC 5.5 03/02/2015   HGB 9.3* 03/02/2015   HCT 29.3* 03/02/2015   PLT 186 03/02/2015   GLUCOSE 145* 03/02/2015   CHOL 107 02/28/2015   TRIG 114 02/28/2015   HDL 29* 02/28/2015   LDLCALC 55 02/28/2015   ALT 53 08/01/2012   AST 47* 08/01/2012   NA 135 03/02/2015   K 3.9 03/02/2015   CL 106 03/02/2015   CREATININE 1.60* 03/02/2015   BUN 20 03/02/2015   CO2 21* 03/02/2015   TSH 4.755* 02/16/2010   INR 1.25 03/02/2015   HGBA1C * 02/16/2010    5.8 (NOTE)  According to the ADA Clinical Practice Recommendations for 2011, when HbA1c is used as a screening test: >=6.5% Diagnostic of Diabetes Mellitus (if abnormal result is confirmed) 5.7-6.4% Increased risk of developing Diabetes Mellitus References:Diagnosis and Classification of Diabetes Mellitus,Diabetes WUJW,1191,47(WGNFA 1):S62-S69 and Standards of Medical Care in Diabetes - 2011,Diabetes Care,2011,34  (Suppl 1):S11-S61.      BNP (last 3 results)  Recent Labs (within last 365 days)     Recent Labs  02/27/15 0945  BNP 165.3*      ProBNP (last 3 results)  Recent Labs (within last 365 days)    No results for input(s): PROBNP in the last 8760 hours.  Other Studies Reviewed Today: [facility] 02/27/15 Conclusion    1. Prox LAD lesion, 50% stenosed. 2. 1st Mrg lesion, 100% stenosed. There is a 0% residual stenosis post intervention. 3. A drug-eluting stent was placed. 4. Prox RCA lesion, 60% stenosed. 5. Mid RCA lesion, 50% stenosed. 6. Dist RCA lesion, 40% stenosed.  FINAL CONCLUSIONS:  TOTAL OCCLUSION OF THE FIRST OM, TREATED SUCCESSFULLY WITH PCI  MODERATE DIFFUSE 3 VESSEL CAD AS OUTLINED ABOVE    2D Echo 03/01/15 Study Conclusions  - Left ventricle: The cavity size was normal. Wall thickness was normal. Systolic function was normal. The estimated ejection fraction was in the range of 50% to 55%. Features are consistent with a pseudonormal left ventricular filling pattern, with concomitant abnormal relaxation and increased filling pressure (grade 2 diastolic dysfunction). - Left atrium: The atrium was mildly dilated.  Impressions:  - The echo is extremely poor quality. Accurate assessment of the valves cannot be made by this echo. The LV function can be seen to be fairly normal using definity contrast.       Assessment/Plan: 1. Post MI with DES to the 1st OM - he is on triple therapy with  aspirin/plavix/coumadin. Most likely he is felt to be bleeding. At discharge he was anemic. Now tachycardic. Admitting for further disposition. Patient has been seen by Dr. Harrington Challenger who is in agreement.   2. Residual CAD disease - to manage medically - fortunately he has had no chest pain.   3. Colo/Rectal cancer - he is seen by Dr. Benay Spice.   4. Bladder cancer  5. Chronic coumadin therapy for PE  6. Hematuria   7. DVT/PE - from several years ago - felt to need chronic therapy due to his weight/sedentary lifestyle/malignancy.   He looks quite anemic. Actively passing large clots ?able to fully empty his bladder. He is tachycardic. Suspect hypovolemia - possible shock. Seen by Dr. Harrington Challenger. Will need to be admitted. Needs stat labs. Probably needs transfusion. I have left him on his Plavix for now but this will need to be addressed. I have not ordered his coumadin on admission. He will need to be seen later today for further disposition.   Current medicines are reviewed with the patient today. The patient does not have concerns regarding medicines other than what has been noted above.  The following changes have been made: See above.  Labs/ tests ordered today include:   Orders Placed This Encounter  Procedures  . EKG 12-Lead     Disposition: Further disposition to follow.   Patient is agreeable to this plan and will call if any problems develop in the interim.   Signed: Burtis Junes, RN, ANP-C 03/11/2015 3:49 PM  [facility] [address] [place], [place] [address] Phone: [phone] Fax: [phone]   I have personally seen and examined this patient. I agree with the assessment and plan as outlined above. He was admitted 12 days ago with a STEMI. He had a DES placed in OM1. 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 He was discharged on ASA/Plavix/coumadin. Hgb 9.3 at time of discharge. He has been  passing large blood clots in his urine for the last several days. He has had progressive weakness over the last two days and can barely walk across the room now. He has no chest pain. He was seen in our office today by Richard Merle, NP and was directly admitted to [facility] for further  workup. Hgb is 4.6. INR is 1.92. EKG shows sinus tach. BP is 136/63.  -Will transfuse 2 units pRBCs tonight.  -Will contact Urology -Will hold coumadin and ASA. Will continue Plavix for now as he is only 12 days out from DES in setting of STEMI. His risk of stent thrombosis and MI off of Plavix in this early period is very high.

## 2015-03-11 NOTE — Consult Note (Signed)
Urology Consult   Physician requesting consult: McAlhany  Reason for consult: Gross Hematuria  Impression/Recommendation 1. Gross Hematuria- no evidence of clot retention at present based on PVR. Please have nursing rack and record urine and continue to perform post-void residuals following each void to ensure this remains low. If evidence of jelly like clots or inability to void, please obtain a formal renal bladder ultrasound to evaluate for clot, if this is indeterminate we will consider obtaining a pelvic CT scan to better identify for clot. Call urology immediately if PVR >500 or unable to void.  2. AKI-likely multifactorial including dehydration and profound anemia. If PVR remains low and no evidence of clot retention this is unlikely to be secondary to an obstructive process. Continue to monitor.  3. Chronic anticoagulation- Discussed with Dr. Angelena Form who feels as though it is imperative that Richard Warner remains on Plavix given his recent drug-eluting stent. He is ok with holding aspirin and coumadin. Agree with holding ASA and coumadin during ongoing bleed.   Plan: -Rack and record voided urine -If unable to void, bladder scan and call Urology for hematuria catheter placement -If clot retention becomes evident will likely plan on obtaining some form of pelvic imaging [place] versus non-cont CT scan to evaluate clot burden and possible need for cystoscopic clot evacuation -If urologic intervention becomes necessary may need to transfer to [facility] to facilitate urologic treatment  Discussed with Dr. Matilde Sprang   History of Present Illness: Richard Warner is a 64 y.o. male with history of rectal cancer s/p resection and chemoradiation therapy in 2013 as well as bladder mass and associated gross hematuria. He recently underwent TURBT with Dr. Janice Norrie 02/23/15 during which the tumor was difficult to visualize but the procedure was otherwise uncomplicated. He followed up recently  for a voiding trial and his urine was clear. Pathology was consistent with adenocarcinoma indicative of metastatic rectal cancer. He is followed by Dr. Benay Spice for this.  We last saw him in consultation 02/28/15 for reports of gross hematuria while an inpatient following percutaneous cardiac intervention and DES placement for proximal LAD lesion. He was reported to have some hematuria following initiation of tripple anticoagulation therapy. On my evaluation at that time he had no hematuria and was voiding without difficulty with low PVR. INR on day of discharge was 1.25 and he was maintained on coumadin, Plavix and aspirin.  Unfortunately he presented to Cardiology clinic today feeling quite weak, short of breath, tachycardic. Per report he was passing large clots in his urine which had been tiring for him. He was found to be profoundly anemic with a hemoglobin of 4.6 from 9.3 at time of discharge. His coumadin was stopped and he was admitted for ongoing care. He was transfused [facility] on admission and coumadin was held. INR was noted to be 1.92. EKG noted sinus tachycardia. Blood pressures have been stable.  His post-void residual was 30 cc and per report he has been able to void.  He denies a history of voiding or storage urinary symptoms, hematuria, UTIs, STDs, urolithiasis, GU malignancy/trauma/surgery.  Past Medical History  Diagnosis Date  . Hypertension   . Pulmonary embolism 12-03-11    01-17-10 s/p DVT left leg -Coumadin tx.until 08-28-11  . Hyperlipidemia   . Leg swelling 12-03-11    bilateral if up most of day,equally swells, up to knee level, improved now  . Fractures 12-03-11    s/p right ankle and right wrist- no problems now  . Hypothyroidism   .  Dyslipidemia   . Anemia 12-03-11    tx. oral iron supplement  . Arthritis 12-03-11    mild lower back  . 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 Colon cancer 12/14/11    Low anterior resection of mid/proximal tumor=invasive  adenocarcinoma,invaing through the muscularis propria into pericolonic fatty tissue  . Chronic kidney disease 12-03-11    kidney stone x1 ,none recent stage III  . Secondary hyperparathyroidism, renal   . Chronic anticoagulation   . Shortness of breath 12-03-11    hx. 6'11- during pulmonary emboli episode, none now, extreme exertion only.  . Morbid obesity   . Vitamin D deficiency   . Chronic pulmonary edema   . Dorsalgia   . Acute MI, true posterior wall, initial episode of care 02/27/2015    Past Surgical History  Procedure Laterality Date  . Colonoscopy  10/08/2011    Procedure: COLONOSCOPY;  Surgeon: Inda Castle, MD;  Location: [facility];  Service: Endoscopy;  Laterality: N/A;  . Tonsillectomy  12-03-11    child  . Proctoscopy  12/14/2011    Procedure: PROCTOSCOPY;  Surgeon: [name], MD;  Location: [facility];  Service: General;  Laterality: N/A;  . Low anterior bowel resection  12/14/2011    Mid/proximal rectal cancer. T3 N0  . Colonoscopy N/A 05/15/2013    Procedure: COLONOSCOPY;  Surgeon: Inda Castle, MD;  Location: [facility];  Service: Endoscopy;  Laterality: N/A;  . Colonoscopy N/A 07/27/2014    Procedure: COLONOSCOPY;  Surgeon: Inda Castle, MD;  Location: [facility];  Service: Endoscopy;  Laterality: N/A;  . Cyst removal trunk N/A 09/17/2014    Procedure: REMOVAL OF ANTERIOR CHEST WALL SKIN MASS;  Surgeon: Michael Boston, MD;  Location: [facility];  Service: General;  Laterality: N/A;  . Cystoscopy with biopsy N/A 12/03/2014    Procedure: CYSTOSCOPY WITH BIOPSY;  Surgeon: Lowella Bandy, MD;  Location: [facility];  Service: Urology;  Laterality: N/A;  . Eus N/A 01/06/2015    Procedure: LOWER ENDOSCOPIC ULTRASOUND (EUS);  Surgeon: [name], MD;  Location: [facility];  Service: Endoscopy;  Laterality: N/A;  . Transurethral resection of bladder tumor N/A 02/23/2015    Procedure: TRANSURETHRAL RESECTION OF BLADDER MASS;  Surgeon: Lowella Bandy, MD;  Location: [facility];   Service: Urology;  Laterality: N/A;  . Cystoscopy N/A 02/23/2015    Procedure: CYSTOSCOPY;  Surgeon: Lowella Bandy, MD;  Location: [facility];  Service: Urology;  Laterality: N/A;  . Cardiac catheterization N/A 02/27/2015    Procedure: Coronary Stent Intervention;  Surgeon: [name], MD;  Location: [place] [facility];  Service: Cardiovascular;  Laterality: N/A;    Current Hospital Medications:  Home Meds:    Medication List    ASK your doctor about these medications        aspirin EC 81 MG tablet  Take 81 mg by mouth every morning.     atorvastatin 80 MG tablet  Commonly known as:  LIPITOR  Take 1 tablet (80 mg total) by mouth daily at 6 PM.     clopidogrel 75 MG tablet  Commonly known as:  PLAVIX  Take 1 tablet (75 mg total) by mouth daily with breakfast.     doxazosin 2 MG tablet  Commonly known as:  CARDURA  Take 2 mg by mouth daily after supper.     ezetimibe 10 MG tablet  Commonly known as:  ZETIA  Take 10 mg by mouth daily after supper.  levothyroxine 88 MCG tablet  Commonly known as:  SYNTHROID, LEVOTHROID  Take 88 mcg by mouth daily before breakfast.     metoprolol tartrate 25 MG tablet  Commonly known as:  LOPRESSOR  Take 0.5 tablets (12.5 mg total) by mouth 2 (two) times daily.     nitroGLYCERIN 0.4 MG SL tablet  Commonly known as:  NITROSTAT  Place 1 tablet (0.4 mg total) under the tongue every 5 (five) minutes as needed for chest pain.     Vitamin D3 5000 UNITS Tabs  Take 5,000 Units by mouth daily after supper.     warfarin 5 MG tablet  Commonly known as:  COUMADIN  TAKE AS DIRECTED BY ANTICOAGULATION CLINIC        Scheduled Meds: . atorvastatin  80 mg Oral q1800  . cholecalciferol  1,000 Units Oral Daily  . clopidogrel  75 mg Oral Daily  . doxazosin  2 mg Oral QHS  . ezetimibe  10 mg Oral Daily  . [START ON 03/12/2015] levothyroxine  88 mcg Oral QAC breakfast  . metoprolol tartrate  12.5 mg Oral BID  . sodium chloride  3 mL Intravenous Q12H    Continuous Infusions: . dextrose 5 % and 0.45% NaCl     PRN Meds:.  Allergies: No Known Allergies  Family History  Problem Relation Age of Onset  . Diabetes Father   . Heart disease Father   . Stroke Maternal Grandmother   . Stroke Maternal Grandfather   . Hypertension Paternal Grandfather   . Lung cancer Paternal Grandfather   . Cancer Paternal Grandfather     Social History:  reports that he has never smoked. He has never used smokeless tobacco. He reports that he does not drink alcohol or use illicit drugs.  ROS: A complete review of systems was performed.  All systems are negative except for pertinent findings as noted.  Physical Exam:  Vital signs in last 24 hours: Temp:  [98.5 F (36.9 C)-98.8 F (37.1 C)] 98.7 F (37.1 C) (08/05 1910) Pulse Rate:  [99-125] 112 (08/05 1910) Resp:  [18] 18 (08/05 1910) BP: (91-136)/(52-66) 102/52 mmHg (08/05 1910) SpO2:  [100 %] 100 % (08/05 1910) Weight:  [160.755 kg (354 lb 6.4 oz)] 160.755 kg (354 lb 6.4 oz) (08/05 1507) Constitutional:  Alert and oriented, No acute distress Cardiovascular: Regular rate and rhythm, No JVD Respiratory: Normal respiratory effort, Lungs clear bilaterally GI: Abdomen is soft, nontender, nondistended, no abdominal masses GU: No CVA tenderness Lymphatic: No lymphadenopathy Neurologic: Grossly intact, no focal deficits Psychiatric: Normal mood and affect  Laboratory Data:   Recent Labs  03/11/15 1710  WBC 5.2  HGB 4.6*  HCT 15.1*  PLT 204     Recent Labs  03/11/15 1710  NA 135  K 4.1  CL 105  GLUCOSE 130*  BUN 21*  CALCIUM 8.5*  CREATININE 1.81*     Results for orders placed or performed during the hospital encounter of 03/11/15 (from the past 24 hour(s))  Brain natriuretic peptide     Status: None   Collection Time: 03/11/15  5:10 PM  Result Value Ref Range   B Natriuretic Peptide 58.2 0.0 - 100.0 pg/mL  CBC WITH DIFFERENTIAL     Status: Abnormal   Collection Time:  03/11/15  5:10 PM  Result Value Ref Range   WBC 5.2 4.0 - 10.5 K/uL   RBC 1.95 (L) 4.22 - 5.81 MIL/uL   Hemoglobin 4.6 (LL) 13.0 - 17.0 g/dL   HCT 15.1 (  L) 39.0 - 52.0 %   MCV 77.4 (L) 78.0 - 100.0 fL   MCH 23.6 (L) 26.0 - 34.0 pg   MCHC 30.5 30.0 - 36.0 g/dL   RDW 15.6 (H) 11.5 - 15.5 %   Platelets 204 150 - 400 K/uL   Neutrophils Relative % 84 (H) 43 - 77 %   Neutro Abs 4.4 1.7 - 7.7 K/uL   Lymphocytes Relative 8 (L) 12 - 46 %   Lymphs Abs 0.4 (L) 0.7 - 4.0 K/uL   Monocytes Relative 7 3 - 12 %   Monocytes Absolute 0.4 0.1 - 1.0 K/uL   Eosinophils Relative 0 0 - 5 %   Eosinophils Absolute 0.0 0.0 - 0.7 K/uL   Basophils Relative 0 0 - 1 %   Basophils Absolute 0.0 0.0 - 0.1 K/uL  Comprehensive metabolic panel     Status: Abnormal   Collection Time: 03/11/15  5:10 PM  Result Value Ref Range   Sodium 135 135 - 145 mmol/L   Potassium 4.1 3.5 - 5.1 mmol/L   Chloride 105 101 - 111 mmol/L   CO2 21 (L) 22 - 32 mmol/L   Glucose, Bld 130 (H) 65 - 99 mg/dL   BUN 21 (H) 6 - 20 mg/dL   Creatinine, Ser 1.81 (H) 0.61 - 1.24 mg/dL   Calcium 8.5 (L) 8.9 - 10.3 mg/dL   Total Protein 5.3 (L) 6.5 - 8.1 g/dL   Albumin 3.1 (L) 3.5 - 5.0 g/dL   AST 18 15 - 41 U/L   ALT 15 (L) 17 - 63 U/L   Alkaline Phosphatase 41 38 - 126 U/L   Total Bilirubin 0.5 0.3 - 1.2 mg/dL   GFR calc non Af Amer 38 (L) >60 mL/min   GFR calc Af Amer 44 (L) >60 mL/min   Anion gap 9 5 - 15  Protime-INR     Status: Abnormal   Collection Time: 03/11/15  5:10 PM  Result Value Ref Range   Prothrombin Time 21.8 (H) 11.6 - 15.2 seconds   INR 1.92 (H) 0.00 - 1.49  Troponin I     Status: Abnormal   Collection Time: 03/11/15  5:10 PM  Result Value Ref Range   Troponin I 0.14 (H) <0.031 ng/mL  TSH     Status: None   Collection Time: 03/11/15  5:10 PM  Result Value Ref Range   TSH 2.528 0.350 - 4.500 uIU/mL  Type and screen     Status: None (Preliminary result)   Collection Time: 03/11/15  5:10 PM  Result Value Ref Range    ABO/RH(D) A POS    Antibody Screen NEG    Sample Expiration 03/14/2015    Unit Number [id number]    Blood Component Type RED CELLS,LR    Unit division 00    Status of Unit ISSUED    Transfusion Status OK TO TRANSFUSE    Crossmatch Result Compatible    Unit Number [id number]    Blood Component Type RED CELLS,LR    Unit division 00    Status of Unit ALLOCATED    Transfusion Status OK TO TRANSFUSE    Crossmatch Result Compatible   ABO/Rh     Status: None   Collection Time: 03/11/15  5:10 PM  Result Value Ref Range   ABO/RH(D) A POS   Urinalysis, Routine w reflex microscopic (not at [facility])     Status: Abnormal   Collection Time: 03/11/15  5:44 PM  Result Value Ref Range  Color, Urine RED (A) YELLOW   APPearance CLOUDY (A) CLEAR   Specific Gravity, Urine 1.016 1.005 - 1.030   pH 6.5 5.0 - 8.0   Glucose, UA 100 (A) NEGATIVE mg/dL   Hgb urine dipstick LARGE (A) NEGATIVE   Bilirubin Urine LARGE (A) NEGATIVE   Ketones, ur 40 (A) NEGATIVE mg/dL   Protein, ur >300 (A) NEGATIVE mg/dL   Urobilinogen, UA 1.0 0.0 - 1.0 mg/dL   Nitrite POSITIVE (A) NEGATIVE   Leukocytes, UA MODERATE (A) NEGATIVE  Urine microscopic-add on     Status: None   Collection Time: 03/11/15  5:44 PM  Result Value Ref Range   WBC, UA 3-6 <3 WBC/hpf   RBC / HPF TOO NUMEROUS TO COUNT <3 RBC/hpf   Bacteria, UA RARE RARE  Prepare RBC     Status: None   Collection Time: 03/11/15  6:04 PM  Result Value Ref Range   Order Confirmation ORDER PROCESSED BY BLOOD BANK    No results found for this or any previous visit (from the past 240 hour(s)).  Renal Function:  Recent Labs  03/11/15 1710  CREATININE 1.81*   Estimated Creatinine Clearance: 63.9 mL/min (by C-G formula based on Cr of 1.81).  Radiologic Imaging: No results found.  I independently reviewed the above imaging studies.  Impression/Recommendation Gross hematuria; obese but not distended; no need for TUR now; continue to transfuse to ideal  Hcrit; we will follow; no need for foley and CBI yet I performed a history and physical examination of the patient and discussed his management with the resident.  I reviewed the resident's note and agree with the documented findings and plan of care   Dr Tyrell Antonio 03/11/2015, 8:08 PM

## 2015-03-11 NOTE — Progress Notes (Signed)
CARDIOLOGY OFFICE NOTE  Date:  03/11/2015    Richard Warner Date of Birth: 07-10-1951 Medical Record #[id number]  PCP:  Rachell Cipro, MD  Cardiologist:  Burt Knack    Chief Complaint  Patient presents with  . Coronary Artery Disease    Post hospital visit - seen for Dr. Burt Knack    History of Present Illness: Richard Warner is a 64 y.o. male who presents today for a post hospital visit. Seen for Dr. Burt Knack. He has a PMHx of morbid obesity, hypertension, pulmonary embolus on warfarin, rectal cancer, and now with bladder cancer.   He was admitted to [facility] on 02/27/2015 for evaluation of ongoing chest discomfort and signs of an ST segment elevation myocardial infarction. Code STEMI was activated and he was taken urgently to the cath lab for revascularization. The procedure was performed by Dr. Burt Knack. He was found to have total occulusion of the 1st OM and underwent successful PCI + DES. He also had residual moderate diffuse 3V CAD to be treated medically (see angiographic details below). EF was normal on cath and by 2D echo at 50-55%. He tolerated the procedure well and left the cath lab in stable condition. His chest pain resolved and he had no further symptoms. He was placed ASA and Plavix. Warfarin was continued given his h/o of PE. Decision was made to continue triple therapy until his INR reached therapeutic range. At that point, ASA will be discontinued and he will continue Plavix + warfarin. He was also placed on high dose statin therapy and BB therapy.  Any surgery for his bladder cancer will have be be delayed until he is cleared by Dr. Burt Knack. He will need to continue uninterrupted Plavix therapy for at least 3 months given newly placed DES. His INR will be followed in our office and ASA will be discontinued once INR is therapeutic. INR day of discharge was 1.25.   Comes in today. Here with his mom today. Pretty pitiful. He is quite weak. He has been short of breath. Very weak.  Feels cold. Tachycardic. No chest pain however. Has started passing large clots in his urine - this has really "wore him out". Very pale. Mom notes that he can barely go from one room to the next. He has not had his coumadin checked yet. HGB at discharge 9.3. This was normal 3 months ago. Last CEA noted to be increasing. He almost went to the ER yesterday but then held off since he was coming here today.   Past Medical History  Diagnosis Date  . Hypertension   . Pulmonary embolism 12-03-11    01-17-10 s/p DVT left leg -Coumadin tx.until 08-28-11  . Hyperlipidemia   . Leg swelling 12-03-11    bilateral if up most of day,equally swells, up to knee level, improved now  . Fractures 12-03-11    s/p right ankle and right wrist- no problems now  . Hypothyroidism   . Dyslipidemia   . Anemia 12-03-11    tx. oral iron supplement  . Arthritis 12-03-11    mild lower back  . 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 Chronic kidney disease 12-03-11    kidney stone x1 ,none recent stage III  . Secondary hyperparathyroidism, renal   . Chronic anticoagulation   . Shortness of breath 12-03-11    hx. 6'11- during pulmonary  emboli episode, none now, extreme exertion only.  . Morbid obesity   . Vitamin D deficiency   . Chronic pulmonary edema   . Dorsalgia   . Acute MI, true posterior wall, initial episode of care 02/27/2015    Past Surgical History  Procedure Laterality Date  . Colonoscopy  10/08/2011    Procedure: COLONOSCOPY;  Surgeon: Inda Castle, MD;  Location: [facility];  Service: Endoscopy;  Laterality: N/A;  . Tonsillectomy  12-03-11    child  . Proctoscopy  12/14/2011    Procedure: PROCTOSCOPY;  Surgeon: [name], MD;  Location: [facility];  Service: General;  Laterality: N/A;  . Low anterior bowel resection  12/14/2011     Mid/proximal rectal cancer. T3 N0  . Colonoscopy N/A 05/15/2013    Procedure: COLONOSCOPY;  Surgeon: Inda Castle, MD;  Location: [facility];  Service: Endoscopy;  Laterality: N/A;  . Colonoscopy N/A 07/27/2014    Procedure: COLONOSCOPY;  Surgeon: Inda Castle, MD;  Location: [facility];  Service: Endoscopy;  Laterality: N/A;  . Cyst removal trunk N/A 09/17/2014    Procedure: REMOVAL OF ANTERIOR CHEST WALL SKIN MASS;  Surgeon: Michael Boston, MD;  Location: [facility];  Service: General;  Laterality: N/A;  . Cystoscopy with biopsy N/A 12/03/2014    Procedure: CYSTOSCOPY WITH BIOPSY;  Surgeon: Lowella Bandy, MD;  Location: [facility];  Service: Urology;  Laterality: N/A;  . Eus N/A 01/06/2015    Procedure: LOWER ENDOSCOPIC ULTRASOUND (EUS);  Surgeon: [name], MD;  Location: [facility];  Service: Endoscopy;  Laterality: N/A;  . Transurethral resection of bladder tumor N/A 02/23/2015    Procedure: TRANSURETHRAL RESECTION OF BLADDER MASS;  Surgeon: Lowella Bandy, MD;  Location: [facility];  Service: Urology;  Laterality: N/A;  . Cystoscopy N/A 02/23/2015    Procedure: CYSTOSCOPY;  Surgeon: Lowella Bandy, MD;  Location: [facility];  Service: Urology;  Laterality: N/A;  . Cardiac catheterization N/A 02/27/2015    Procedure: Coronary Stent Intervention;  Surgeon: [name], MD;  Location: [facility];  Service: Cardiovascular;  Laterality: N/A;     Medications: Current Outpatient Prescriptions  Medication Sig Dispense Refill  . aspirin EC 81 MG tablet Take 81 mg by mouth every morning.     [name] atorvastatin (LIPITOR) 80 MG tablet Take 1 tablet (80 mg total) by mouth daily at 6 PM. 30 tablet 5  . Cholecalciferol (VITAMIN D3) 5000 UNITS TABS Take 1 capsule by mouth at bedtime.     . clopidogrel (PLAVIX) 75 MG tablet Take 1 tablet (75 mg total) by mouth daily with breakfast. 30 tablet 5  . doxazosin (CARDURA) 2 MG tablet Take 2 mg by mouth at bedtime.    [name] ezetimibe (ZETIA) 10 MG tablet Take 10 mg by  mouth at bedtime.     [name] levothyroxine (SYNTHROID, LEVOTHROID) 88 MCG tablet Take 88 mcg by mouth daily before breakfast.    . metoprolol tartrate (LOPRESSOR) 25 MG tablet Take 0.5 tablets (12.5 mg total) by mouth 2 (two) times daily. 60 tablet 5  . nitroGLYCERIN (NITROSTAT) 0.4 MG SL tablet Place 1 tablet (0.4 mg total) under the tongue every 5 (five) minutes as needed for chest pain. 25 tablet 2  . warfarin (COUMADIN) 5 MG tablet TAKE AS DIRECTED BY ANTICOAGULATION CLINIC (Patient taking differently: Take 2.5-5 mg by mouth daily. Takes 1 tablet everyday except for Saturday takes 1/2 tablet) 90 tablet 1   No current facility-administered medications for this visit.  Allergies: No Known Allergies  Social History: The patient  reports that he has never smoked. He has never used smokeless tobacco. He reports that he does not drink alcohol or use illicit drugs.   Family History: The patient's family history includes Cancer in his paternal grandfather; Diabetes in his father; Heart disease in his father; Hypertension in his paternal grandfather; Lung cancer in his paternal grandfather; Stroke in his maternal grandfather and maternal grandmother.   Review of Systems: Please see the history of present illness.   Otherwise, the review of systems is positive for none.   All other systems are reviewed and negative.   Physical Exam: VS:  BP 110/66 mmHg  Pulse 125  Ht 5\' 11"  (1.803 m)  Wt 354 lb 6.4 oz (160.755 kg)  BMI 49.45 kg/m2  SpO2 100% .  BMI Body mass index is 49.45 kg/(m^2).  Wt Readings from Last 3 Encounters:  03/11/15 354 lb 6.4 oz (160.755 kg)  03/02/15 362 lb 9.6 oz (164.474 kg)  02/23/15 366 lb (166.017 kg)    General: Pleasant. Little anxious. Morbidly obese. Very pale in appearance and short of breath with talking.   HEENT: Normal. Neck: Supple, no JVD, carotid bruits, or masses noted.  Cardiac: He is tachycardic today. Legs are quite full with brawny stasis  changes/edema.  Respiratory:  Lungs are fairly clear to auscultation bilaterally withincreased work of breathing.  GI: Soft and nontender. Obese MS: No deformity or atrophy. Gait not tested. ROM intact. Skin: Warm and dry. Color is normal.  Neuro:  Strength and sensation are intact and no gross focal deficits noted.  Psych: Alert, appropriate and with normal affect.   LABORATORY DATA:  EKG:  EKG is ordered today. This demonstrates sinus tachycardia.  Lab Results  Component Value Date   WBC 5.5 03/02/2015   HGB 9.3* 03/02/2015   HCT 29.3* 03/02/2015   PLT 186 03/02/2015   GLUCOSE 145* 03/02/2015   CHOL 107 02/28/2015   TRIG 114 02/28/2015   HDL 29* 02/28/2015   LDLCALC 55 02/28/2015   ALT 53 08/01/2012   AST 47* 08/01/2012   NA 135 03/02/2015   K 3.9 03/02/2015   CL 106 03/02/2015   CREATININE 1.60* 03/02/2015   BUN 20 03/02/2015   CO2 21* 03/02/2015   TSH 4.755* 02/16/2010   INR 1.25 03/02/2015   HGBA1C * 02/16/2010    5.8 (NOTE)                                                                       According to the ADA Clinical Practice Recommendations for 2011, when HbA1c is used as a screening test:   >=6.5%   Diagnostic of Diabetes Mellitus           (if abnormal result  is confirmed)  5.7-6.4%   Increased risk of developing Diabetes Mellitus  References:Diagnosis and Classification of Diabetes Mellitus,Diabetes YIRS,8546,27(OJJKK 1):S62-S69 and Standards of Medical Care in         Diabetes - 2011,Diabetes Care,2011,34  (Suppl 1):S11-S61.    BNP (last 3 results)  Recent Labs  02/27/15 0945  BNP 165.3*    ProBNP (last 3 results) No results for input(s): PROBNP in the last 8760 hours.   Other  Studies Reviewed Today: [facility] 02/27/15 Conclusion    1. Prox LAD lesion, 50% stenosed. 2. 1st Mrg lesion, 100% stenosed. There is a 0% residual stenosis post intervention. 3. A drug-eluting stent was placed. 4. Prox RCA lesion, 60% stenosed. 5. Mid RCA lesion, 50%  stenosed. 6. Dist RCA lesion, 40% stenosed.  FINAL CONCLUSIONS:  TOTAL OCCLUSION OF THE FIRST OM, TREATED SUCCESSFULLY WITH PCI  MODERATE DIFFUSE 3 VESSEL CAD AS OUTLINED ABOVE    2D Echo 03/01/15 Study Conclusions  - Left ventricle: The cavity size was normal. Wall thickness was normal. Systolic function was normal. The estimated ejection fraction was in the range of 50% to 55%. Features are consistent with a pseudonormal left ventricular filling pattern, with concomitant abnormal relaxation and increased filling pressure (grade 2 diastolic dysfunction). - Left atrium: The atrium was mildly dilated.  Impressions:  - The echo is extremely poor quality. Accurate assessment of the valves cannot be made by this echo. The LV function can be seen to be fairly normal using definity contrast.       Assessment/Plan: 1. Post MI with DES to the 1st OM - he is on triple therapy with aspirin/plavix/coumadin. Most likely he is felt to be bleeding. At discharge he was anemic. Now tachycardic. Admitting for further disposition. Patient has been seen by Dr. Harrington Challenger who is in agreement.   2. Residual CAD disease - to manage medically - fortunately he has had no chest pain.   3. Colo/Rectal cancer - he is seen by Dr. Benay Spice.   4. Bladder cancer  5. Chronic coumadin therapy for PE  6. Hematuria   7. DVT/PE - from several years ago - felt to need chronic therapy due to his weight/sedentary lifestyle/malignancy.   He looks quite anemic. Actively passing large clots ?able to fully empty his bladder. He is tachycardic. Suspect hypovolemia - possible shock. Seen by Dr. Harrington Challenger. Will need to be admitted. Needs stat labs. Probably needs transfusion. I have left him on his Plavix for now but this will need to be addressed. He will need to be seen later today for further disposition.   Current medicines are reviewed with the patient today.  The patient does not have concerns  regarding medicines other than what has been noted above.  The following changes have been made:  See above.  Labs/ tests ordered today include:    Orders Placed This Encounter  Procedures  . EKG 12-Lead     Disposition:   Further disposition to follow.   Patient is agreeable to this plan and will call if any problems develop in the interim.   Signed: Burtis Junes, RN, ANP-C 03/11/2015 3:49 PM  [facility] [address] [place], [place]  [address] Phone: [phone] Fax: [phone]

## 2015-03-11 NOTE — Patient Instructions (Addendum)
We are admitting you to the hospital today. 

## 2015-03-12 ENCOUNTER — Encounter (HOSPITAL_COMMUNITY): Payer: Self-pay | Admitting: *Deleted

## 2015-03-12 DIAGNOSIS — D6489 Other specified anemias: Secondary | ICD-10-CM | POA: Diagnosis not present

## 2015-03-12 DIAGNOSIS — E039 Hypothyroidism, unspecified: Secondary | ICD-10-CM | POA: Diagnosis present

## 2015-03-12 DIAGNOSIS — R531 Weakness: Secondary | ICD-10-CM | POA: Diagnosis present

## 2015-03-12 DIAGNOSIS — I2782 Chronic pulmonary embolism: Secondary | ICD-10-CM | POA: Diagnosis present

## 2015-03-12 DIAGNOSIS — N179 Acute kidney failure, unspecified: Secondary | ICD-10-CM | POA: Diagnosis present

## 2015-03-12 DIAGNOSIS — C19 Malignant neoplasm of rectosigmoid junction: Secondary | ICD-10-CM | POA: Diagnosis present

## 2015-03-12 DIAGNOSIS — Z79899 Other long term (current) drug therapy: Secondary | ICD-10-CM | POA: Diagnosis not present

## 2015-03-12 DIAGNOSIS — I129 Hypertensive chronic kidney disease with stage 1 through stage 4 chronic kidney disease, or unspecified chronic kidney disease: Secondary | ICD-10-CM | POA: Diagnosis present

## 2015-03-12 DIAGNOSIS — Z6841 Body Mass Index (BMI) 40.0 and over, adult: Secondary | ICD-10-CM | POA: Diagnosis not present

## 2015-03-12 DIAGNOSIS — C7911 Secondary malignant neoplasm of bladder: Secondary | ICD-10-CM | POA: Diagnosis not present

## 2015-03-12 DIAGNOSIS — Z7982 Long term (current) use of aspirin: Secondary | ICD-10-CM | POA: Diagnosis not present

## 2015-03-12 DIAGNOSIS — Z9049 Acquired absence of other specified parts of digestive tract: Secondary | ICD-10-CM | POA: Diagnosis present

## 2015-03-12 DIAGNOSIS — M1990 Unspecified osteoarthritis, unspecified site: Secondary | ICD-10-CM | POA: Diagnosis present

## 2015-03-12 DIAGNOSIS — R58 Hemorrhage, not elsewhere classified: Secondary | ICD-10-CM | POA: Diagnosis not present

## 2015-03-12 DIAGNOSIS — R310 Gross hematuria: Secondary | ICD-10-CM | POA: Diagnosis present

## 2015-03-12 DIAGNOSIS — I2510 Atherosclerotic heart disease of native coronary artery without angina pectoris: Secondary | ICD-10-CM | POA: Diagnosis present

## 2015-03-12 DIAGNOSIS — Z923 Personal history of irradiation: Secondary | ICD-10-CM | POA: Diagnosis not present

## 2015-03-12 DIAGNOSIS — D696 Thrombocytopenia, unspecified: Secondary | ICD-10-CM | POA: Diagnosis present

## 2015-03-12 DIAGNOSIS — Z862 Personal history of diseases of the blood and blood-forming organs and certain disorders involving the immune mechanism: Secondary | ICD-10-CM | POA: Diagnosis not present

## 2015-03-12 DIAGNOSIS — R0600 Dyspnea, unspecified: Secondary | ICD-10-CM | POA: Diagnosis not present

## 2015-03-12 DIAGNOSIS — D509 Iron deficiency anemia, unspecified: Secondary | ICD-10-CM | POA: Diagnosis present

## 2015-03-12 DIAGNOSIS — N183 Chronic kidney disease, stage 3 (moderate): Secondary | ICD-10-CM | POA: Diagnosis present

## 2015-03-12 DIAGNOSIS — E213 Hyperparathyroidism, unspecified: Secondary | ICD-10-CM | POA: Diagnosis present

## 2015-03-12 DIAGNOSIS — Z510 Encounter for antineoplastic radiation therapy: Secondary | ICD-10-CM | POA: Diagnosis present

## 2015-03-12 DIAGNOSIS — I252 Old myocardial infarction: Secondary | ICD-10-CM | POA: Diagnosis not present

## 2015-03-12 DIAGNOSIS — E559 Vitamin D deficiency, unspecified: Secondary | ICD-10-CM | POA: Diagnosis present

## 2015-03-12 DIAGNOSIS — Z7902 Long term (current) use of antithrombotics/antiplatelets: Secondary | ICD-10-CM | POA: Diagnosis not present

## 2015-03-12 DIAGNOSIS — R5381 Other malaise: Secondary | ICD-10-CM | POA: Diagnosis not present

## 2015-03-12 DIAGNOSIS — I2111 ST elevation (STEMI) myocardial infarction involving right coronary artery: Secondary | ICD-10-CM | POA: Diagnosis not present

## 2015-03-12 DIAGNOSIS — D62 Acute posthemorrhagic anemia: Secondary | ICD-10-CM | POA: Diagnosis present

## 2015-03-12 DIAGNOSIS — C20 Malignant neoplasm of rectum: Secondary | ICD-10-CM | POA: Diagnosis not present

## 2015-03-12 DIAGNOSIS — Z86718 Personal history of other venous thrombosis and embolism: Secondary | ICD-10-CM | POA: Diagnosis not present

## 2015-03-12 DIAGNOSIS — Z7901 Long term (current) use of anticoagulants: Secondary | ICD-10-CM | POA: Diagnosis not present

## 2015-03-12 LAB — CBC
HCT: 18.1 % — ABNORMAL LOW (ref 39.0–52.0)
Hemoglobin: 5.8 g/dL — CL (ref 13.0–17.0)
MCH: 24.9 pg — ABNORMAL LOW (ref 26.0–34.0)
MCHC: 32 g/dL (ref 30.0–36.0)
MCV: 77.7 fL — ABNORMAL LOW (ref 78.0–100.0)
Platelets: 202 10*3/uL (ref 150–400)
RBC: 2.33 MIL/uL — ABNORMAL LOW (ref 4.22–5.81)
RDW: 15.2 % (ref 11.5–15.5)
WBC: 4.7 10*3/uL (ref 4.0–10.5)

## 2015-03-12 LAB — PROTIME-INR
INR: 1.82 — ABNORMAL HIGH (ref 0.00–1.49)
Prothrombin Time: 21 seconds — ABNORMAL HIGH (ref 11.6–15.2)

## 2015-03-12 LAB — CBC WITH DIFFERENTIAL/PLATELET
Basophils Absolute: 0 10*3/uL (ref 0.0–0.1)
Basophils Relative: 1 % (ref 0–1)
Eosinophils Absolute: 0 10*3/uL (ref 0.0–0.7)
Eosinophils Relative: 1 % (ref 0–5)
HCT: 18.4 % — ABNORMAL LOW (ref 39.0–52.0)
Hemoglobin: 5.9 g/dL — CL (ref 13.0–17.0)
Lymphocytes Relative: 7 % — ABNORMAL LOW (ref 12–46)
Lymphs Abs: 0.3 10*3/uL — ABNORMAL LOW (ref 0.7–4.0)
MCH: 24.8 pg — ABNORMAL LOW (ref 26.0–34.0)
MCHC: 32.1 g/dL (ref 30.0–36.0)
MCV: 77.3 fL — ABNORMAL LOW (ref 78.0–100.0)
Monocytes Absolute: 0.3 10*3/uL (ref 0.1–1.0)
Monocytes Relative: 7 % (ref 3–12)
Neutro Abs: 4.1 10*3/uL (ref 1.7–7.7)
Neutrophils Relative %: 86 % — ABNORMAL HIGH (ref 43–77)
Platelets: 219 10*3/uL (ref 150–400)
RBC: 2.38 MIL/uL — ABNORMAL LOW (ref 4.22–5.81)
RDW: 15.3 % (ref 11.5–15.5)
WBC: 4.8 10*3/uL (ref 4.0–10.5)

## 2015-03-12 LAB — BASIC METABOLIC PANEL
Anion gap: 8 (ref 5–15)
BUN: 21 mg/dL — ABNORMAL HIGH (ref 6–20)
CO2: 22 mmol/L (ref 22–32)
Calcium: 8 mg/dL — ABNORMAL LOW (ref 8.9–10.3)
Chloride: 105 mmol/L (ref 101–111)
Creatinine, Ser: 1.85 mg/dL — ABNORMAL HIGH (ref 0.61–1.24)
GFR calc Af Amer: 43 mL/min — ABNORMAL LOW (ref >60)
GFR calc non Af Amer: 37 mL/min — ABNORMAL LOW (ref >60)
Glucose, Bld: 145 mg/dL — ABNORMAL HIGH (ref 65–99)
Potassium: 4 mmol/L (ref 3.5–5.1)
Sodium: 135 mmol/L (ref 135–145)

## 2015-03-12 LAB — PREPARE RBC (CROSSMATCH)

## 2015-03-12 LAB — TROPONIN I: Troponin I: 0.15 ng/mL — ABNORMAL HIGH (ref <0.031)

## 2015-03-12 MED ORDER — ACETAMINOPHEN 325 MG PO TABS: 650.0000 mg | ORAL_TABLET | Freq: Four times a day (QID) | ORAL | Status: DC | PRN

## 2015-03-12 MED ORDER — SODIUM CHLORIDE 0.9 % IV SOLN: Freq: Once | INTRAVENOUS | Status: DC

## 2015-03-12 MED ORDER — HYDROCODONE-ACETAMINOPHEN 5-325 MG PO TABS: 1.0000 | ORAL_TABLET | ORAL | Status: DC | PRN

## 2015-03-12 MED ORDER — FUROSEMIDE 10 MG/ML IJ SOLN
20.0000 mg | Freq: Once | INTRAMUSCULAR | Status: AC
  Administered 2015-03-12: 20 mg via INTRAVENOUS
  Filled 2015-03-12: qty 2

## 2015-03-12 MED ORDER — OXYBUTYNIN CHLORIDE 5 MG PO TABS
5.0000 mg | ORAL_TABLET | Freq: Once | ORAL | Status: AC
  Administered 2015-03-12: 5 mg via ORAL
  Filled 2015-03-12: qty 1

## 2015-03-12 NOTE — Progress Notes (Signed)
Subjective:  Had additional bleeding from his urinary tract overnight.  Says his shortness of breath is improved and has no chest pain.  Troponins are very minimally elevated today.  Urology evidently is to see today.  Received 2 units of blood last night.  Objective:  Vital Signs in the last 24 hours: BP 131/63 mmHg  Pulse 94  Temp(Src) 98.9 F (37.2 C) (Oral)  Resp 18  Wt 160.256 kg (353 lb 4.8 oz)  SpO2 99%  Physical Exam: Extremely obese male currently in no acute distress Lungs:  Clear, reduced breath sounds Cardiac:  Regular rhythm, normal S1 and S2, no S3 Extremities:  No edema present  Intake/Output from previous day: 08/05 0701 - 08/06 0700 In: 670 [Blood:670] Out: 690 [Urine:690]  Weight Filed Weights   03/12/15 0500  Weight: 160.256 kg (353 lb 4.8 oz)    Lab Results: Basic Metabolic Panel:  Recent Labs  03/11/15 1710 03/12/15 0421  NA 135 135  K 4.1 4.0  CL 105 105  CO2 21* 22  GLUCOSE 130* 145*  BUN 21* 21*  CREATININE 1.81* 1.85*   CBC:  Recent Labs  03/11/15 1710 03/12/15 0421  WBC 5.2 4.7  NEUTROABS 4.4  --   HGB 4.6* 5.8*  HCT 15.1* 18.1*  MCV 77.4* 77.7*  PLT 204 202   Cardiac Panel (last 3 results)  Recent Labs  03/11/15 1710 03/12/15 0434  TROPONINI 0.14* 0.15*    Telemetry: Sinus rhythm today  Assessment/Plan:  1.  Severe anemia persistent despite transfusion of 2 red blood cells due to urinary bleeding 2.  Recent infarction with drug-eluting stents 3.  Urinary bleeding 4.  History of pulmonary emboli  Recommendations:  He will need 2 more units of blood today.  Spoke to urology he will be seen later on today.  Continue to monitor.     Kerry Hough  MD [facility] Card[facility]  03/12/2015, 8:52 AM

## 2015-03-12 NOTE — Progress Notes (Signed)
At shift assessment patient unable to void and having a great deal of pain. He was having a lot of pressure and pain was 9/10. Bladder scan performed and was unable to get a reading on the scanner after trying many times. Bladder did not feel hard. Was about to notify the MD and was called to the room by the patient. He had passed a large blood clot in a towel that was wrapped around him. The towel was partially saturated with red liquid as well. Patient stated that the pain was relieved. Dr. Ottis Stain was notified of findings and patient care was discussed. New orders received for pain management. Will continue to monitor closely and notify MD as needed.

## 2015-03-12 NOTE — Progress Notes (Signed)
Pt's IV infiltrated during blood infusion. Blood was stopped a new IV site was obtained. Blood restarted, vital signs obtained before restart and 15 minutes after blood restarted. Wardell Honour

## 2015-03-13 LAB — BASIC METABOLIC PANEL
ANION GAP: 10 (ref 5–15)
BUN: 28 mg/dL — ABNORMAL HIGH (ref 6–20)
CO2: 20 mmol/L — ABNORMAL LOW (ref 22–32)
Calcium: 8 mg/dL — ABNORMAL LOW (ref 8.9–10.3)
Chloride: 107 mmol/L (ref 101–111)
Creatinine, Ser: 2.1 mg/dL — ABNORMAL HIGH (ref 0.61–1.24)
GFR calc Af Amer: 37 mL/min — ABNORMAL LOW (ref >60)
GFR calc non Af Amer: 32 mL/min — ABNORMAL LOW (ref >60)
Glucose, Bld: 142 mg/dL — ABNORMAL HIGH (ref 65–99)
POTASSIUM: 4.1 mmol/L (ref 3.5–5.1)
Sodium: 137 mmol/L (ref 135–145)

## 2015-03-13 LAB — CBC
HCT: 20.6 % — ABNORMAL LOW (ref 39.0–52.0)
HCT: 25.4 % — ABNORMAL LOW (ref 39.0–52.0)
HEMOGLOBIN: 8.4 g/dL — AB (ref 13.0–17.0)
Hemoglobin: 6.8 g/dL — CL (ref 13.0–17.0)
MCH: 26.5 pg (ref 26.0–34.0)
MCH: 26.3 pg (ref 26.0–34.0)
MCHC: 33 g/dL (ref 30.0–36.0)
MCHC: 33.1 g/dL (ref 30.0–36.0)
MCV: 80.2 fL (ref 78.0–100.0)
MCV: 79.6 fL (ref 78.0–100.0)
PLATELETS: 224 10*3/uL (ref 150–400)
Platelets: 192 10*3/uL (ref 150–400)
RBC: 2.57 MIL/uL — AB (ref 4.22–5.81)
RBC: 3.19 MIL/uL — AB (ref 4.22–5.81)
RDW: 15.7 % — AB (ref 11.5–15.5)
RDW: 15.3 % (ref 11.5–15.5)
WBC: 7.7 10*3/uL (ref 4.0–10.5)
WBC: 6.4 10*3/uL (ref 4.0–10.5)

## 2015-03-13 LAB — PREPARE RBC (CROSSMATCH)

## 2015-03-13 LAB — URINE CULTURE

## 2015-03-13 MED ORDER — SODIUM CHLORIDE 0.9 % IV SOLN
Freq: Once | INTRAVENOUS | Status: AC
  Administered 2015-03-13: 100 mL via INTRAVENOUS

## 2015-03-13 MED ORDER — FUROSEMIDE 10 MG/ML IJ SOLN
20.0000 mg | Freq: Once | INTRAMUSCULAR | Status: AC
  Administered 2015-03-13: 20 mg via INTRAVENOUS
  Filled 2015-03-13: qty 2

## 2015-03-13 NOTE — Clinical Documentation Improvement (Signed)
"  Severe anemia persistent despite transfusion of 2 red blood cells due to urinary bleeding" currently documented in the chart.  Please document the acuity of the blood loss anemia in your future progress notes and carry over to the discharge summary.  Possible Clinical Conditions: -Acute blood loss anemia -Other condition -Unable to determine at present  Thank you, Mateo Flow, RN [phone] Clinical Documentation Specialist

## 2015-03-13 NOTE — Consult Note (Signed)
Subjective: The patient reports continued passage of clots and urine with overall improvement in discomfort. Remains stable.  Objective: Vital signs in last 24 hours: Temp:  [98 F (36.7 C)-99.3 F (37.4 C)] 98.4 F (36.9 C) (08/07 0500) Pulse Rate:  [100-116] 101 (08/07 0500) Resp:  [15-26] 21 (08/07 0500) BP: (103-166)/(45-76) 134/63 mmHg (08/07 0500) SpO2:  [98 %-100 %] 98 % (08/07 0500) Weight:  [162.478 kg (358 lb 3.2 oz)] 162.478 kg (358 lb 3.2 oz) (08/07 0500)A  Intake/Output from previous day: 08/06 0701 - 08/07 0700 In: 1808.8 [P.O.:710; I.V.:353.8; Blood:745] Out: 200 [Urine:200] Intake/Output this shift:    Past Medical History  Diagnosis Date  . Hypertension   . Pulmonary embolism 12-03-11    01-17-10 s/p DVT left leg -Coumadin tx.until 08-28-11  . Hyperlipidemia   . Leg swelling 12-03-11    bilateral if up most of day,equally swells, up to knee level, improved now  . Fractures 12-03-11    s/p right ankle and right wrist- no problems now  . Hypothyroidism   . Dyslipidemia   . Anemia 12-03-11    tx. oral iron supplement  . Arthritis 12-03-11    mild lower back  . 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 Chronic kidney disease 12-03-11    kidney stone x1 ,none recent stage III  . Secondary hyperparathyroidism, renal   . Chronic anticoagulation   . Shortness of breath 12-03-11    hx. 6'11- during pulmonary emboli episode, none now, extreme exertion only.  . Morbid obesity   . Vitamin D deficiency   . Chronic pulmonary edema   . Dorsalgia   . Acute MI, true posterior wall, initial episode of care 02/27/2015    Physical Exam:  Lungs - Normal respiratory effort, chest expands symmetrically.  Abdomen - Soft, non-tender & non-distended.  Lab Results:  Recent Labs  03/12/15 0421  03/12/15 1023 03/13/15 0416  WBC 4.7 4.8 7.7  HGB 5.8* 5.9* 6.8*  HCT 18.1* 18.4* 20.6*   BMET  Recent Labs  03/12/15 0421 03/13/15 0416  NA 135 137  K 4.0 4.1  CL 105 107  CO2 22 20*  GLUCOSE 145* 142*  BUN 21* 28*  CREATININE 1.85* 2.10*  CALCIUM 8.0* 8.0*   No results for input(s): LABURIN in the last 72 hours. Results for orders placed or performed during the hospital encounter of 03/11/15  Urine culture     Status: None (Preliminary result)   Collection Time: 03/11/15  5:44 PM  Result Value Ref Range Status   Specimen Description URINE, CLEAN CATCH  Final   Special Requests NONE  Final   Culture TOO YOUNG TO READ  Final   Report Status PENDING  Incomplete    Studies/Results: No results found.  Assessment: Gross Hematuria-continues to pass clots and void with no evidence of clot retention on bladder scan. Oozing is likely from raw edges from prior TUR procedure in setting of tripple anticoagulation. Catheter placement or additional urologic procedure will only likely exacerbate bleeding.   -Continue to bladder scan q 4 hours or if feels as though he is unable to void -Norco for pain -Hold off on anticholinergic for bladder spasms for now as I do not want to compromise his ability to void -Continue to transfuse to desired Hb -We will continue to monitor closely  Discussed with Dr. Matilde Sprang  Plan:  Richard Warner 03/13/2015, 7:18 AM

## 2015-03-13 NOTE — Progress Notes (Addendum)
Subjective:  This continued urinary bleeding.  Had difficulty with clot retention last night but was able to get it  to pass.  Hemoglobin is still low despite the transfusion of 4 units of packed cells.  Urology evidently has seen today but no note on chart yet.  No complaints of shortness of breath.  He felt fairly well yesterday until he had the urinary clot retention.  Objective:  Vital Signs in the last 24 hours: BP 123/60 mmHg  Pulse 105  Temp(Src) 98.2 F (36.8 C) (Oral)  Resp 19  Ht 5\' 11"  (1.803 m)  Wt 162.478 kg (358 lb 3.2 oz)  BMI 49.98 kg/m2  SpO2 97%  Physical Exam: Extremely obese male currently in no acute distress Lungs:  Clear, reduced breath sounds Cardiac:  Regular rhythm, normal S1 and S2, no S3 Extremities:  No edema present  Intake/Output from previous day: 08/06 0701 - 08/07 0700 In: 1808.8 [P.O.:710; I.V.:353.8; Blood:745] Out: 200 [Urine:200]  Weight Filed Weights   03/12/15 0500 03/13/15 0500  Weight: 160.256 kg (353 lb 4.8 oz) 162.478 kg (358 lb 3.2 oz)    Lab Results: Basic Metabolic Panel:  Recent Labs  03/12/15 0421 03/13/15 0416  NA 135 137  K 4.0 4.1  CL 105 107  CO2 22 20*  GLUCOSE 145* 142*  BUN 21* 28*  CREATININE 1.85* 2.10*   CBC:  Recent Labs  03/11/15 1710  03/12/15 1023 03/13/15 0416  WBC 5.2  < > 4.8 7.7  NEUTROABS 4.4  --  4.1  --   HGB 4.6*  < > 5.9* 6.8*  HCT 15.1*  < > 18.4* 20.6*  MCV 77.4*  < > 77.3* 80.2  PLT 204  < > 219 224  < > = values in this interval not displayed. Cardiac Panel (last 3 results)  Recent Labs  03/11/15 1710 03/12/15 0434  TROPONINI 0.14* 0.15*    Telemetry: Sinus tachycardia  Assessment/Plan:  1.  Severe anemia persistent despite transfusion of 4 red blood cells due to urinary bleeding 2.  Recent myocardial infarction with drug-eluting stents 3.  Urinary bleeding-ongoing 4.  History of pulmonary emboli 5.  Morbid obesity 6.  Acute renal failure may have contribution of  urinary retention  Recommendations:  He will need 2 more units of blood today.  In light of recent infarction would like to try to get hemoglobin higher than it currently is.  Await urology note and opinion regarding management of clots and bleeding.  Currently off of anticoagulation.  Watch renal function.   Kerry Hough  MD [facility]  03/13/2015, 8:51 AM

## 2015-03-14 ENCOUNTER — Encounter (HOSPITAL_COMMUNITY): Payer: Self-pay | Admitting: General Practice

## 2015-03-14 ENCOUNTER — Other Ambulatory Visit: Payer: Self-pay | Admitting: *Deleted

## 2015-03-14 ENCOUNTER — Telehealth: Payer: Self-pay | Admitting: *Deleted

## 2015-03-14 DIAGNOSIS — R310 Gross hematuria: Secondary | ICD-10-CM

## 2015-03-14 DIAGNOSIS — I2510 Atherosclerotic heart disease of native coronary artery without angina pectoris: Secondary | ICD-10-CM

## 2015-03-14 DIAGNOSIS — D6489 Other specified anemias: Secondary | ICD-10-CM

## 2015-03-14 LAB — BASIC METABOLIC PANEL
Anion gap: 7 (ref 5–15)
BUN: 25 mg/dL — ABNORMAL HIGH (ref 6–20)
CO2: 24 mmol/L (ref 22–32)
Calcium: 7.8 mg/dL — ABNORMAL LOW (ref 8.9–10.3)
Chloride: 106 mmol/L (ref 101–111)
Creatinine, Ser: 1.83 mg/dL — ABNORMAL HIGH (ref 0.61–1.24)
GFR calc Af Amer: 43 mL/min — ABNORMAL LOW (ref >60)
GFR calc non Af Amer: 37 mL/min — ABNORMAL LOW (ref >60)
Glucose, Bld: 126 mg/dL — ABNORMAL HIGH (ref 65–99)
Potassium: 3.7 mmol/L (ref 3.5–5.1)
SODIUM: 137 mmol/L (ref 135–145)

## 2015-03-14 LAB — PROTIME-INR
INR: 1.29 (ref 0.00–1.49)
Prothrombin Time: 16.3 seconds — ABNORMAL HIGH (ref 11.6–15.2)

## 2015-03-14 LAB — CBC
HCT: 23.5 % — ABNORMAL LOW (ref 39.0–52.0)
Hemoglobin: 7.7 g/dL — ABNORMAL LOW (ref 13.0–17.0)
MCH: 26.3 pg (ref 26.0–34.0)
MCHC: 32.8 g/dL (ref 30.0–36.0)
MCV: 80.2 fL (ref 78.0–100.0)
Platelets: 193 10*3/uL (ref 150–400)
RBC: 2.93 MIL/uL — AB (ref 4.22–5.81)
RDW: 15.6 % — ABNORMAL HIGH (ref 11.5–15.5)
WBC: 4.9 10*3/uL (ref 4.0–10.5)

## 2015-03-14 LAB — PREPARE RBC (CROSSMATCH)

## 2015-03-14 MED ORDER — VITAMIN K1 10 MG/ML IJ SOLN
10.0000 mg | Freq: Once | INTRAVENOUS | Status: AC
  Administered 2015-03-14: 10 mg via INTRAVENOUS
  Filled 2015-03-14 (×2): qty 1

## 2015-03-14 MED ORDER — SODIUM CHLORIDE 0.9 % IV SOLN: Freq: Once | INTRAVENOUS | Status: DC

## 2015-03-14 NOTE — Progress Notes (Signed)
Patient: Richard Warner / Admit Date: 03/11/2015 / Date of Encounter: 03/14/2015, 9:17 AM   Subjective: Continues to pass large clots and bright red urine.  Denies any CP, SOB, palpitations - feels well from cardiac standpoint.   Objective: Telemetry: sinus tach, currently around 95-100 Physical Exam: Blood pressure 111/65, pulse 102, temperature 98.5 F (36.9 C), temperature source Oral, resp. rate 17, height 5\' 11"  (1.803 m), weight 352 lb 8 oz (159.893 kg), SpO2 97 %. General: Well developed, well nourished obese WM, in no acute distress. Head: Normocephalic, atraumatic, sclera non-icteric, no xanthomas, nares are without discharge. Neck: JVP not elevated. Lungs: Clear bilaterally to auscultation without wheezes, rales, or rhonchi. Breathing is unlabored. Heart: RRR mildly elevated rate S1 S2 without murmurs, rubs, or gallops.  Abdomen: Soft, non-tender, non-distended with normoactive bowel sounds. No rebound/guarding. Extremities: No clubbing or cyanosis. No edema. Distal pedal pulses are 2+ and equal bilaterally. + SCDs Neuro: Alert and oriented X 3. Moves all extremities spontaneously. Psych:  Responds to questions appropriately with a normal affect.   Intake/Output Summary (Last 24 hours) at 03/14/15 0917 Last data filed at 03/14/15 0000  Gross per 24 hour  Intake   1212 ml  Output    225 ml  Net    987 ml    Inpatient Medications:  . sodium chloride   Intravenous Once  . atorvastatin  80 mg Oral q1800  . cholecalciferol  1,000 Units Oral Daily  . clopidogrel  75 mg Oral Daily  . doxazosin  2 mg Oral QHS  . ezetimibe  10 mg Oral Daily  . levothyroxine  88 mcg Oral QAC breakfast  . metoprolol tartrate  12.5 mg Oral BID  . sodium chloride  3 mL Intravenous Q12H   Infusions:  . dextrose 5 % and 0.45% NaCl 75 mL/hr at 03/14/15 0008    Labs:  Recent Labs  03/13/15 0416 03/14/15 0406  NA 137 137  K 4.1 3.7  CL 107 106  CO2 20* 24  GLUCOSE 142* 126*  BUN 28*  25*  CREATININE 2.10* 1.83*  CALCIUM 8.0* 7.8*    Recent Labs  03/11/15 1710  AST 18  ALT 15*  ALKPHOS 41  BILITOT 0.5  PROT 5.3*  ALBUMIN 3.1*    Recent Labs  03/11/15 1710  03/12/15 1023  03/13/15 1914 03/14/15 0406  WBC 5.2  < > 4.8  < > 6.4 4.9  NEUTROABS 4.4  --  4.1  --   --   --   HGB 4.6*  < > 5.9*  < > 8.4* 7.7*  HCT 15.1*  < > 18.4*  < > 25.4* 23.5*  MCV 77.4*  < > 77.3*  < > 79.6 80.2  PLT 204  < > 219  < > 192 193  < > = values in this interval not displayed.  Recent Labs  03/11/15 1710 03/12/15 0434  TROPONINI 0.14* 0.15*   Invalid input(s): POCBNP No results for input(s): HGBA1C in the last 72 hours.   Radiology/Studies:  Dg Chest 2 View  02/27/2015   CLINICAL DATA:  Chest pain and shortness of breath. History of rectal carcinoma.  EXAM: CHEST - 2 VIEW  COMPARISON:  09/13/2014  FINDINGS: Stable cardiac enlargement. Lung volumes are low with bibasilar atelectasis present. There is no evidence of pulmonary edema, consolidation, pneumothorax, nodule or pleural fluid. Stable osteopenia and spondylosis of the thoracic spine.  IMPRESSION: Low volumes with bibasilar atelectasis.  No active disease.   Electronically  Signed   By: Aletta Edouard M.D.   On: 02/27/2015 11:28   Ct Head Wo Contrast  02/27/2015   CLINICAL DATA:  Prior low anterior resection of rectal carcinoma in May, 2013. Recent TURBT 02/23/2015 for transitional cell carcinoma. Prior history of melanoma. Patient presents with a generalized headache which began acutely earlier today. Patient had a similar episode 2 days ago.  EXAM: CT HEAD WITHOUT CONTRAST  TECHNIQUE: Contiguous axial images were obtained from the base of the skull through the vertex without intravenous contrast.  COMPARISON:  None.  FINDINGS: Ventricular system normal in size and appearance for age. No mass lesion. No midline shift. No acute hemorrhage or hematoma. No extra-axial fluid collections. No evidence of acute infarction. No  focal brain parenchymal abnormality.  No skull fracture or other focal osseous abnormality involving the skull. Visualized paranasal sinuses, bilateral mastoid air cells and bilateral middle ear cavities well-aerated. Minimal bilateral carotid siphon atherosclerosis.  IMPRESSION: Normal intracranially.   Electronically Signed   By: Evangeline Dakin M.D.   On: 02/27/2015 11:15     Assessment and Plan   1. Severe acute blood loss anemia due to bleeding from urinary tract 2. Recent STEMI 02/27/15 with DES to OM1, residual moderate diffuse 3V CAD, EF 50-55% 3. 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 Morbid obesity Body mass index is 49.19 kg/(m^2). 5. Rectal cancer and bladder cancer - recently underwent TURBT; pathology c/w adenocarcinoma indicative of metastatic rectal CA 6. AKI on CKD stage III (prior Cr 1.5-1.7)  Urology feels that oozing is likely from raw edges from prior TUR procedure in setting of triple anticoagulation. Appreciate their input regarding further management. Aspirin and Coumadin remain on hold; still on Plavix given recent MI. Sinus tach likely 2/2 anemia. Continue BB and statin. Would not titrate BB at present time given SBPs 110s. Hgb declined again from yesterday despite additional 2 U PBRCs - likely needs more blood today; will d/w MD.  ?Internal medicine consult to manage anemia given relative stability of cardiac status.   Signed, Melina Copa PA-C Pager: [phone]    Patient seen and examined. Agree with assessment and plan. No recurrent anginal symptoms.  He passed a large clot from urine this am. Urology is following.  With H/H remaining low despite PRBC Tx yesterday with hematuria and recent STEMI will transfuse another PRBC unit today.  F/U Will ask internal medicine to see and assume primary care and notify Dr. Betsy Coder his oncologist.    Troy Sine, MD, [facility] 03/14/2015 9:52 AM

## 2015-03-14 NOTE — Telephone Encounter (Signed)
"  My name is Agustina Caroli PA with [facility] caring for this patient currently admitted.  Would like to speak with Dr, Benay Spice to discuss and manage his care.  Pager [phone].  Phone [phone]

## 2015-03-14 NOTE — Care Management Important Message (Signed)
Important Message  Patient Details  Name: Richard Warner MRN: [id number] Date of Birth: 22-Jun-1951   Medicare Important Message Given:  Yes-second notification given    Pricilla Handler 03/14/2015, 3:00 PM

## 2015-03-14 NOTE — Consult Note (Signed)
[facility] Medical Consultation  Richard Warner VZC:[id number] DOB: 14-Feb-1951 DOA: 03/11/2015 PCP: Rachell Cipro, MD   Requesting physician: Dr. Claiborne Billings Date of consultation: 03/14/2015 Reason for consultation: Assume primary medical management  Impression/Recommendations Principal Problem:   Gross hematuria Active Problems:   Acute MI inferior posterior subsequent episode care   Hypothyroidism   Obesity, Class III, BMI 40-49.9 (morbid obesity)   RENAL DISEASE, CHRONIC, STAGE III   Bleeding   Cardiology has requested that [facility] assume the role of primary inpatient physician.  Gross hematuria Secondary to a combination of recent TURBT (7/20) and anti-platelet plus anticoagulation therapy. Baseline Hgb 15.1 on 4/21 and 9.5 on 7/24. Current Hgb status post 6 units of PRBCs is 7.7 Check stat INR. If elevated, will give FFP Per cardiology at his absolute necessary to continue Plavix given DES placement on 7/24. Aspirin and Coumadin have been discontinued. Will discuss with hematology/oncology as patient continues to bleed. Appreciate urology consultation and recommendations.  They recommend against foley or cystoscopy at this time due to risk of trauma.  STEMI on 7/24 Cardiology managing. Patient on Plavix, atorvastatin, Zetia, metoprolol.  Metastatic rectal cancer Patient is status post hemicolectomy in 2013. 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 Discussed with Dr. Benay Spice of oncology who will see the patient on 8/9  Hypothyroidism On levothyroxine  Chronic kidney disease Creatinine currently 1.83, baseline appears to be about 1.5. Bump in creatinine is likely due to severe anemia. I do not see any nephrotoxic medications in use.  Morbid obesity   Chief Complaint: Hematuria  HPI:  64 year old male with hypertension, chronic kidney disease, rectal cancer, and recent STEMI, presents from cardiology office  with  weakness and shortness of breath on 8/5. Hemoglobin was 4.6.  Per the patient report he was diagnosed with PE/DVT in 2011 and placed on Coumadin. He remained on Coumadin and in 2013 he was diagnosed with colon cancer. He underwent hemicolectomy and did well. In 2016 his CEA was elevated Dr. Benay Spice recommended a prostate biopsy. The first biopsy was negative for cancer but the patient bled for 3 weeks after having it done. Despite a negative biopsy the CEA was still elevated and a second prostate biopsy was done on 7/20 (which was positive for adenocarcinoma). On 7/24 the patient had a STEMI. A drug-eluting stent was inserted and he was placed on aspirin/Plavix/Coumadin therapy.  On Thursday 7/28 the patient began to have gross hematuria. He was not in any pain or distress so he waited until his appointment on 8/5 to seek medical attention. At his cardiology appointment he was found to be pale, weak, and short of breath and was sent to the hospital for admission. Since admission he has received 6 units of packed red blood cells. He is receiving his seventh unit today. He continues to have hematuria with clots. No Foley has been placed due to concern for possible trauma.  Urology is consulting and recommends tracking post void residuals to ensure he does not become obstructed due to clots. His INR on admission was 1.92. 8/8 INR is currently pending.  Review of Systems:    Past Medical History  Diagnosis Date  . Hypertension   . Pulmonary embolism 12-03-11    01-17-10 s/p DVT left leg -Coumadin tx.until 08-28-11  . Hyperlipidemia   . Leg swelling 12-03-11    bilateral if up most of day,equally swells, up to knee level, improved now  . Fractures 12-03-11    s/p right ankle and right  wrist- no problems now  . Hypothyroidism   . Dyslipidemia   . Anemia 12-03-11    tx. oral iron supplement  . Arthritis 12-03-11    mild lower back  . 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 Colon cancer 12/14/11     Low anterior resection of mid/proximal tumor=invasive adenocarcinoma,invaing through the muscularis propria into pericolonic fatty tissue  . Chronic kidney disease 12-03-11    kidney stone x1 ,none recent stage III  . Secondary hyperparathyroidism, renal   . Chronic anticoagulation   . Shortness of breath 12-03-11    hx. 6'11- during pulmonary emboli episode, none now, extreme exertion only.  . Morbid obesity   . Vitamin D deficiency   . Chronic pulmonary edema   . Dorsalgia   . Acute MI, true posterior wall, initial episode of care 02/27/2015   Past Surgical History  Procedure Laterality Date  . Colonoscopy  10/08/2011    Procedure: COLONOSCOPY;  Surgeon: Inda Castle, MD;  Location: [facility];  Service: Endoscopy;  Laterality: N/A;  . Tonsillectomy  12-03-11    child  . Proctoscopy  12/14/2011    Procedure: PROCTOSCOPY;  Surgeon: [name], MD;  Location: [facility];  Service: General;  Laterality: N/A;  . Low anterior bowel resection  12/14/2011    Mid/proximal rectal cancer. T3 N0  . Colonoscopy N/A 05/15/2013    Procedure: COLONOSCOPY;  Surgeon: Inda Castle, MD;  Location: [facility];  Service: Endoscopy;  Laterality: N/A;  . Colonoscopy N/A 07/27/2014    Procedure: COLONOSCOPY;  Surgeon: Inda Castle, MD;  Location: [facility];  Service: Endoscopy;  Laterality: N/A;  . Cyst removal trunk N/A 09/17/2014    Procedure: REMOVAL OF ANTERIOR CHEST WALL SKIN MASS;  Surgeon: Michael Boston, MD;  Location: [facility];  Service: General;  Laterality: N/A;  . Cystoscopy with biopsy N/A 12/03/2014    Procedure: CYSTOSCOPY WITH BIOPSY;  Surgeon: Lowella Bandy, MD;  Location: [facility];  Service: Urology;  Laterality: N/A;  . Eus N/A 01/06/2015    Procedure: LOWER ENDOSCOPIC ULTRASOUND (EUS);  Surgeon: [name], MD;  Location: [facility];  Service: Endoscopy;  Laterality: N/A;  . Transurethral resection of bladder tumor N/A 02/23/2015    Procedure: TRANSURETHRAL RESECTION OF BLADDER  MASS;  Surgeon: Lowella Bandy, MD;  Location: [facility];  Service: Urology;  Laterality: N/A;  . Cystoscopy N/A 02/23/2015    Procedure: CYSTOSCOPY;  Surgeon: Lowella Bandy, MD;  Location: [facility];  Service: Urology;  Laterality: N/A;  . Cardiac catheterization N/A 02/27/2015    Procedure: Coronary Stent Intervention;  Surgeon: [name], MD;  Location: [place] [facility];  Service: Cardiovascular;  Laterality: N/A;   Social History:  reports that he has never smoked. He has never used smokeless tobacco. He reports that he does not drink alcohol or use illicit drugs.  ambulates  No Known Allergies Family History  Problem Relation Age of Onset  . Diabetes Father   . Heart disease Father   . Stroke Maternal Grandmother   . Stroke Maternal Grandfather   . Hypertension Paternal Grandfather   . Lung cancer Paternal Grandfather   . Cancer Paternal Grandfather     Prior to Admission medications   Medication Sig Start Date End Date Taking? Authorizing Provider  aspirin EC 81 MG tablet Take 81 mg by mouth every morning.    Yes Historical Provider, MD  atorvastatin (LIPITOR) 80 MG tablet Take 1 tablet (80 mg total) by mouth  daily at 6 PM. Patient taking differently: Take 80 mg by mouth daily after supper.  03/02/15  Yes Brittainy Erie Noe, PA-C  Cholecalciferol (VITAMIN D3) 5000 UNITS TABS Take 5,000 Units by mouth daily after supper.    Yes Historical Provider, MD  clopidogrel (PLAVIX) 75 MG tablet Take 1 tablet (75 mg total) by mouth daily with breakfast. Patient taking differently: Take 75 mg by mouth daily before breakfast.  03/02/15  Yes Brittainy Erie Noe, PA-C  doxazosin (CARDURA) 2 MG tablet Take 2 mg by mouth daily after supper.    Yes Historical Provider, MD  ezetimibe (ZETIA) 10 MG tablet Take 10 mg by mouth daily after supper.    Yes Historical Provider, MD  levothyroxine (SYNTHROID, LEVOTHROID) 88 MCG tablet Take 88 mcg by mouth daily before breakfast.   Yes Historical Provider, MD   metoprolol tartrate (LOPRESSOR) 25 MG tablet Take 0.5 tablets (12.5 mg total) by mouth 2 (two) times daily. 03/02/15  Yes Brittainy Erie Noe, PA-C  nitroGLYCERIN (NITROSTAT) 0.4 MG SL tablet Place 1 tablet (0.4 mg total) under the tongue every 5 (five) minutes as needed for chest pain. 03/02/15  Yes Brittainy Erie Noe, PA-C  warfarin (COUMADIN) 5 MG tablet TAKE AS DIRECTED BY ANTICOAGULATION CLINIC Patient taking differently: Take 2.5-5 mg by mouth daily after supper. Take 1/2 tablet (2.5 mg) on Saturday, take 1 tablet (5 mg) on all other days of the week 09/07/14  Yes Tanda Rockers, MD   Physical Exam: Blood pressure 111/65, pulse 102, temperature 98.5 F (36.9 C), temperature source Oral, resp. rate 17, height 5\' 11"  (1.803 m), weight 159.893 kg (352 lb 8 oz), SpO2 97 %. Filed Vitals:   03/14/15 0500  BP: 111/65  Pulse: 102  Temp: 98.5 F (36.9 C)  Resp: 17     General:  Very pleasant, obese male, lying in bed in no apparent distress  Eyes: Sclera clear, conjunctiva pink, pupils equal and round  ENT: Solo red stripe down the center of this tongue, no exudates or erythema in his oropharynx  Neck: Thick, no obvious lymphadenopathy  Cardiovascular: Regular rate and rhythm, no frank murmurs rubs or gallops  Respiratory: Short breaths, clear to auscultation, no wheezes crackles or rales  Abdomen: Obese, well-healed scar, nontender, nondistended, decreased bowel sounds  Skin: No rash or lesions, several small bruises on his lower abdomen from REGULATION injections  Musculoskeletal: Strength is 5/5, able to all move all extremities  Psychiatric: Alert, oriented, appropriate, cooperative  Neurologic: No focal neuro deficits  Labs on Admission:  Basic Metabolic Panel:  Recent Labs Lab 03/11/15 1710 03/12/15 0421 03/13/15 0416 03/14/15 0406  NA 135 135 137 137  K 4.1 4.0 4.1 3.7  CL 105 105 107 106  CO2 21* 22 20* 24  GLUCOSE 130* 145* 142* 126*  BUN 21* 21* 28* 25*   CREATININE 1.81* 1.85* 2.10* 1.83*  CALCIUM 8.5* 8.0* 8.0* 7.8*   Liver Function Tests:  Recent Labs Lab 03/11/15 1710  AST 18  ALT 15*  ALKPHOS 41  BILITOT 0.5  PROT 5.3*  ALBUMIN 3.1*   CBC:  Recent Labs Lab 03/11/15 1710 03/12/15 0421 03/12/15 1023 03/13/15 0416 03/13/15 1914 03/14/15 0406  WBC 5.2 4.7 4.8 7.7 6.4 4.9  NEUTROABS 4.4  --  4.1  --   --   --   HGB 4.6* 5.8* 5.9* 6.8* 8.4* 7.7*  HCT 15.1* 18.1* 18.4* 20.6* 25.4* 23.5*  MCV 77.4* 77.7* 77.3* 80.2 79.6 80.2  PLT 204  202 219 224 192 193   Cardiac Enzymes:  Recent Labs Lab 03/11/15 1710 03/12/15 0434  TROPONINI 0.14* 0.15*    Radiological Exams on Admission: No results found.  EKG: Independently reviewed. Sinus tach. QT is not prolonged  Time spent: [address] [facility] Pager [phone]  If 7PM-7AM, please contact night-coverage [email or web address] Password [facility] 03/14/2015, 10:48 AM

## 2015-03-15 ENCOUNTER — Other Ambulatory Visit: Payer: Medicare Other

## 2015-03-15 ENCOUNTER — Telehealth: Payer: Self-pay | Admitting: Oncology

## 2015-03-15 ENCOUNTER — Ambulatory Visit: Payer: Medicare Other | Admitting: Nurse Practitioner

## 2015-03-15 ENCOUNTER — Other Ambulatory Visit: Payer: Self-pay | Admitting: Nurse Practitioner

## 2015-03-15 DIAGNOSIS — I2510 Atherosclerotic heart disease of native coronary artery without angina pectoris: Secondary | ICD-10-CM | POA: Diagnosis present

## 2015-03-15 DIAGNOSIS — R0600 Dyspnea, unspecified: Secondary | ICD-10-CM

## 2015-03-15 DIAGNOSIS — N183 Chronic kidney disease, stage 3 (moderate): Secondary | ICD-10-CM

## 2015-03-15 DIAGNOSIS — E039 Hypothyroidism, unspecified: Secondary | ICD-10-CM

## 2015-03-15 DIAGNOSIS — C20 Malignant neoplasm of rectum: Secondary | ICD-10-CM

## 2015-03-15 DIAGNOSIS — N289 Disorder of kidney and ureter, unspecified: Secondary | ICD-10-CM

## 2015-03-15 DIAGNOSIS — I2111 ST elevation (STEMI) myocardial infarction involving right coronary artery: Secondary | ICD-10-CM

## 2015-03-15 DIAGNOSIS — D500 Iron deficiency anemia secondary to blood loss (chronic): Secondary | ICD-10-CM

## 2015-03-15 DIAGNOSIS — R58 Hemorrhage, not elsewhere classified: Secondary | ICD-10-CM

## 2015-03-15 DIAGNOSIS — R5381 Other malaise: Secondary | ICD-10-CM

## 2015-03-15 LAB — COMPREHENSIVE METABOLIC PANEL
ALBUMIN: 2.7 g/dL — AB (ref 3.5–5.0)
ALT: 15 U/L — ABNORMAL LOW (ref 17–63)
ANION GAP: 9 (ref 5–15)
AST: 18 U/L (ref 15–41)
Alkaline Phosphatase: 45 U/L (ref 38–126)
BUN: 21 mg/dL — ABNORMAL HIGH (ref 6–20)
CHLORIDE: 107 mmol/L (ref 101–111)
CO2: 21 mmol/L — ABNORMAL LOW (ref 22–32)
Calcium: 8 mg/dL — ABNORMAL LOW (ref 8.9–10.3)
Creatinine, Ser: 1.67 mg/dL — ABNORMAL HIGH (ref 0.61–1.24)
GFR calc Af Amer: 48 mL/min — ABNORMAL LOW (ref >60)
GFR calc non Af Amer: 42 mL/min — ABNORMAL LOW (ref >60)
GLUCOSE: 120 mg/dL — AB (ref 65–99)
Potassium: 3.7 mmol/L (ref 3.5–5.1)
Sodium: 137 mmol/L (ref 135–145)
TOTAL PROTEIN: 4.9 g/dL — AB (ref 6.5–8.1)
Total Bilirubin: 0.9 mg/dL (ref 0.3–1.2)

## 2015-03-15 LAB — TYPE AND SCREEN
ABO/RH(D): A POS
Antibody Screen: NEGATIVE
Unit division: 0
Unit division: 0
Unit division: 0
Unit division: 0
Unit division: 0
Unit division: 0
Unit division: 0

## 2015-03-15 LAB — BASIC METABOLIC PANEL
ANION GAP: 7 (ref 5–15)
BUN: 22 mg/dL — ABNORMAL HIGH (ref 6–20)
CHLORIDE: 107 mmol/L (ref 101–111)
CO2: 23 mmol/L (ref 22–32)
CREATININE: 1.79 mg/dL — AB (ref 0.61–1.24)
Calcium: 7.8 mg/dL — ABNORMAL LOW (ref 8.9–10.3)
GFR, EST AFRICAN AMERICAN: 44 mL/min — AB (ref >60)
GFR, EST NON AFRICAN AMERICAN: 38 mL/min — AB (ref >60)
Glucose, Bld: 141 mg/dL — ABNORMAL HIGH (ref 65–99)
POTASSIUM: 3.4 mmol/L — AB (ref 3.5–5.1)
Sodium: 137 mmol/L (ref 135–145)

## 2015-03-15 LAB — CBC
HEMATOCRIT: 23.8 % — AB (ref 39.0–52.0)
HEMOGLOBIN: 7.7 g/dL — AB (ref 13.0–17.0)
MCH: 26 pg (ref 26.0–34.0)
MCHC: 32.4 g/dL (ref 30.0–36.0)
MCV: 80.4 fL (ref 78.0–100.0)
Platelets: 180 10*3/uL (ref 150–400)
RBC: 2.96 MIL/uL — AB (ref 4.22–5.81)
RDW: 16.6 % — ABNORMAL HIGH (ref 11.5–15.5)
WBC: 4.2 10*3/uL (ref 4.0–10.5)

## 2015-03-15 MED ORDER — POTASSIUM CHLORIDE CRYS ER 20 MEQ PO TBCR
40.0000 meq | EXTENDED_RELEASE_TABLET | Freq: Once | ORAL | Status: AC
  Administered 2015-03-15: 40 meq via ORAL

## 2015-03-15 MED ORDER — FERROUS SULFATE 325 (65 FE) MG PO TABS
325.0000 mg | ORAL_TABLET | Freq: Three times a day (TID) | ORAL | Status: DC
  Administered 2015-03-15 – 2015-03-19 (×12): 325 mg via ORAL
  Filled 2015-03-15 (×12): qty 1

## 2015-03-15 NOTE — Progress Notes (Signed)
UR Completed Bari Leib Graves-Bigelow, RN,BSN [phone]  

## 2015-03-15 NOTE — Progress Notes (Signed)
  Radiation Oncology         [phone]) [phone] ________________________________  Name: Richard Warner MRN: [id number]  Date: 03/11/2015  DOB: [birth year]  Chart Note:  I was contacted by Dr. Benay Spice, since Dr. Lisbeth Renshaw is away from the clinic this week.  I reviewed this patient's most recent findings and wanted to take a minute to document my impression.  This patient received pelvic radiotherapy for rectal cancer in 2014.  Recently his CEA was rising and PET/CT followed by cystoscopy has confirmed an apparent isolated anterior bladder wall mass.  With significant hematuria, one question that has come up is whether palliative RT could be used for hemostasis.  In reviewing the location of the bladder tumor on PET imaging    And comparing the same level of his radiotherapy plan    The tumor appears to be located in a volume which received between 30 and 40 Gy.  At this dose level, It may be feasible to deliver palliative radiotherapy in order to help achieve hemostasis.  Impression/Plan:  In light of this information, I would consider transfer to [facility] for radiotherapy.  I have not met with the patient yet to discuss, but, will plan to do so.  ________________________________  Sheral Apley. Tammi Klippel, M.D.

## 2015-03-15 NOTE — Care Management Note (Addendum)
Case Management Note  Patient Details  Name: Richard Warner MRN: [id number] Date of Birth: Apr 23, 1951  Subjective/Objective: Pt admitted with gross hematuria and severe anemia. Patient has received a total of 7 units PRBC per MD notes and having ongoing hematuria with clots.                   Action/Plan:  CM will continue to monitor for disposition needs.    Expected Discharge Date:                  Expected Discharge Plan:  Home/Self Care  In-House Referral:     Discharge planning Services  CM Consult  Post Acute Care Choice:    Choice offered to:     DME Arranged:    DME Agency:     HH Arranged:    HH Agency:     Status of Service:  In process, will continue to follow  Medicare Important Message Given:  Yes-second notification given Date Medicare IM Given:    Medicare IM give by:    Date Additional Medicare IM Given:    Additional Medicare Important Message give by:     If discussed at [place] of Stay Meetings, dates discussed:    Additional Comments: 03-16-15 [address] Jacqlyn Krauss, RN,BSN [phone] Pt to be transferred to [facility] for Radiation. CM will continue to monitor for additional needs.   Bethena Roys, RN 03/15/2015, 11:04 AM

## 2015-03-15 NOTE — Telephone Encounter (Signed)
S/w pt confirming labs/ov 08/09 POF. Mailed schedule to pt due to he is in the hospital... KJ

## 2015-03-15 NOTE — Progress Notes (Signed)
[facility] PROGRESS NOTE  Richard Warner ZOX:[id number] DOB: 08-23-1950 DOA: 03/11/2015 PCP: Rachell Cipro, MD  Assessment/Plan: Principal Problem:   Gross hematuria Active Problems:   Hypothyroidism   Obesity, Class III, BMI 40-49.9 (morbid obesity)   RENAL DISEASE, CHRONIC, STAGE III   Acute MI inferior posterior subsequent episode care   Bleeding   Cardiology has requested that [facility] assume the role of primary inpatient physician.  Gross hematuria-improving Secondary to a combination of recent TURBT (7/20) and anti-platelet plus anticoagulation therapy. Baseline Hgb 15.1 on 4/21 and 9.5 on 7/24. Current Hgb status post 6 units of PRBCs is 7.7 INR 1.29 today Per cardiology at his absolute necessary to continue Plavix given DES placement on 7/24. Aspirin and Coumadin have been discontinued. Will discuss with hematology/oncology as patient continues to bleed. Appreciate urology consultation and recommendations. They recommend against foley or cystoscopy at this time due to risk of trauma.  Acute blood loss anemia due to bleeding from urinary tract - hgb 4.6 on arrival 03/11/2015. Hgb continue to trend down after transfusion, receiving additional PRBC.urology consulted, felt bleeding likely from triple therapy with ASA, plavix and coumadin and from edge of previous TUR. holding ASA and coumadin, continue on plavix given recent MI. Transfuse for hemoglobin less than 7.5  STEMI on 7/24 Cardiology managing. Patient on Plavix, atorvastatin, Zetia, metoprolol.  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 Discussed with Dr. Benay Spice of oncology who will see the patient on 8/9  Hypothyroidism On levothyroxine  Chronic kidney disease Creatinine currently 1.79 baseline appears to be about 1.5. Bump in  creatinine is likely due to severe anemia. I do not see any nephrotoxic medications in use.  Morbid obesity Body mass index is 49.21 kg/(m^2).    Code Status:      Code Status Orders        Start     Ordered   03/11/15 1618  Full code   Continuous     03/11/15 1617     Family Communication: family updated about patient's clinical progress Disposition Plan:  As above    Brief narrative: 64 year old male with hypertension, chronic kidney disease, rectal cancer, and recent STEMI, presents from cardiology office with weakness and shortness of breath on 8/5. Hemoglobin was 4.6. Per the patient report he was diagnosed with PE/DVT in 2011 and placed on Coumadin. He remained on Coumadin and in 2013 he was diagnosed with colon cancer. He underwent hemicolectomy and did well. In 2016 his CEA was elevated Dr. Benay Spice recommended a prostate biopsy. The first biopsy was negative for cancer but the patient bled for 3 weeks after having it done. Despite a negative biopsy the CEA was still elevated and a second prostate biopsy was done on 7/20 (which was positive for adenocarcinoma). On 7/24 the patient had a STEMI. A drug-eluting stent was inserted and he was placed on aspirin/Plavix/Coumadin therapy. On Thursday 7/28 the patient began to have gross hematuria. He was not in any pain or distress so he waited until his appointment on 8/5 to seek medical attention. At his cardiology appointment he was found to be pale, weak, and short of breath and was sent to the hospital for admission. Since admission he has received 6 units of packed red blood cells. He is receiving his seventh unit today. He continues to have hematuria with clots. No Foley  has been placed due to concern for possible trauma. Urology is consulting and recommends tracking post void residuals to ensure he does not become obstructed due to clots. His INR on admission was 1.92.    Consultants:  Hematology oncology, urology,  cardiology  Procedures:  None  Antibiotics: Anti-infectives    None         HPI/Subjective: Denies any CP or SOB. Feeling well, still has small amount of blood clot in urine, but much better   Objective: Filed Vitals:   03/14/15 1225 03/14/15 1556 03/14/15 2100 03/15/15 0500  BP: 125/73 141/64 123/63 130/52  Pulse: 95 99 98 93  Temp: 98.3 F (36.8 C) 98.5 F (36.9 C) 98.5 F (36.9 C) 98.1 F (36.7 C)  TempSrc: Oral Oral    Resp: 16 16 18 17   Height:      Weight:    159.984 kg (352 lb 11.2 oz)  SpO2: 100% 99% 98% 97%    Intake/Output Summary (Last 24 hours) at 03/15/15 1104 Last data filed at 03/14/15 2230  Gross per 24 hour  Intake    575 ml  Output    200 ml  Net    375 ml    Exam:  General: Pleasant, NAD. Neuro: Alert and oriented X 3. Moves all extremities spontaneously. Psych: Normal affect. HEENT: Normal Neck: Supple without bruits or JVD. Lungs: Resp regular and unlabored, CTA. Heart: RRR no s3, s4, or murmurs. Abdomen: Soft, non-tender, non-distended, BS + x 4.  Extremities: No clubbing, cyanosis or edema. DP/PT/Radials 2+ and equal bilaterally.    Data Review   Micro Results Recent Results (from the past 240 hour(s))  Urine culture     Status: None   Collection Time: 03/11/15  5:44 PM  Result Value Ref Range Status   Specimen Description URINE, CLEAN CATCH  Final   Special Requests NONE  Final   Culture MULTIPLE SPECIES PRESENT, SUGGEST RECOLLECTION  Final   Report Status 03/13/2015 FINAL  Final    Radiology Reports Dg Chest 2 View  02/27/2015   CLINICAL DATA:  Chest pain and shortness of breath. History of rectal carcinoma.  EXAM: CHEST - 2 VIEW  COMPARISON:  09/13/2014  FINDINGS: Stable cardiac enlargement. Lung volumes are low with bibasilar atelectasis present. There is no evidence of pulmonary edema, consolidation, pneumothorax, nodule or pleural fluid. Stable osteopenia and spondylosis of the thoracic spine.   IMPRESSION: Low volumes with bibasilar atelectasis.  No active disease.   Electronically Signed   By: Aletta Edouard M.D.   On: 02/27/2015 11:28   Ct Head Wo Contrast  02/27/2015   CLINICAL DATA:  Prior low anterior resection of rectal carcinoma in May, 2013. Recent TURBT 02/23/2015 for transitional cell carcinoma. Prior history of melanoma. Patient presents with a generalized headache which began acutely earlier today. Patient had a similar episode 2 days ago.  EXAM: CT HEAD WITHOUT CONTRAST  TECHNIQUE: Contiguous axial images were obtained from the base of the skull through the vertex without intravenous contrast.  COMPARISON:  None.  FINDINGS: Ventricular system normal in size and appearance for age. No mass lesion. No midline shift. No acute hemorrhage or hematoma. No extra-axial fluid collections. No evidence of acute infarction. No focal brain parenchymal abnormality.  No skull fracture or other focal osseous abnormality involving the skull. Visualized paranasal sinuses, bilateral mastoid air cells and bilateral middle ear cavities well-aerated. Minimal bilateral carotid siphon atherosclerosis.  IMPRESSION: Normal intracranially.   Electronically Signed   By: Marcello Moores  Lawrence M.D.   On: 02/27/2015 11:15     CBC  Recent Labs Lab 03/11/15 1710  03/12/15 1023 03/13/15 0416 03/13/15 1914 03/14/15 0406 03/15/15 0445  WBC 5.2  < > 4.8 7.7 6.4 4.9 4.2  HGB 4.6*  < > 5.9* 6.8* 8.4* 7.7* 7.7*  HCT 15.1*  < > 18.4* 20.6* 25.4* 23.5* 23.8*  PLT 204  < > 219 224 192 193 180  MCV 77.4*  < > 77.3* 80.2 79.6 80.2 80.4  MCH 23.6*  < > 24.8* 26.5 26.3 26.3 26.0  MCHC 30.5  < > 32.1 33.0 33.1 32.8 32.4  RDW 15.6*  < > 15.3 15.7* 15.3 15.6* 16.6*  LYMPHSABS 0.4*  --  0.3*  --   --   --   --   MONOABS 0.4  --  0.3  --   --   --   --   EOSABS 0.0  --  0.0  --   --   --   --   BASOSABS 0.0  --  0.0  --   --   --   --   < > = values in this interval not displayed.  Chemistries   Recent Labs Lab  03/11/15 1710 03/12/15 0421 03/13/15 0416 03/14/15 0406 03/15/15 0445  NA 135 135 137 137 137  K 4.1 4.0 4.1 3.7 3.4*  CL 105 105 107 106 107  CO2 21* 22 20* 24 23  GLUCOSE 130* 145* 142* 126* 141*  BUN 21* 21* 28* 25* 22*  CREATININE 1.81* 1.85* 2.10* 1.83* 1.79*  CALCIUM 8.5* 8.0* 8.0* 7.8* 7.8*  AST 18  --   --   --   --   ALT 15*  --   --   --   --   ALKPHOS 41  --   --   --   --   BILITOT 0.5  --   --   --   --    ------------------------------------------------------------------------------------------------------------------ estimated creatinine clearance is 64.4 mL/min (by C-G formula based on Cr of 1.79). ------------------------------------------------------------------------------------------------------------------ No results for input(s): HGBA1C in the last 72 hours. ------------------------------------------------------------------------------------------------------------------ No results for input(s): CHOL, HDL, LDLCALC, TRIG, CHOLHDL, LDLDIRECT in the last 72 hours. ------------------------------------------------------------------------------------------------------------------ No results for input(s): TSH, T4TOTAL, T3FREE, THYROIDAB in the last 72 hours.  Invalid input(s): FREET3 ------------------------------------------------------------------------------------------------------------------ No results for input(s): VITAMINB12, FOLATE, FERRITIN, TIBC, IRON, RETICCTPCT in the last 72 hours.  Coagulation profile  Recent Labs Lab 03/11/15 1710 03/12/15 0421 03/14/15 1154  INR 1.92* 1.82* 1.29    No results for input(s): DDIMER in the last 72 hours.  Cardiac Enzymes  Recent Labs Lab 03/11/15 1710 03/12/15 0434  TROPONINI 0.14* 0.15*   ------------------------------------------------------------------------------------------------------------------ Invalid input(s): POCBNP   CBG: No results for input(s): GLUCAP in the last 168  hours.     Studies: No results found.    Lab Results  Component Value Date   HGBA1C * 02/16/2010    5.8 (NOTE)                                                                       According to the ADA Clinical Practice Recommendations for 2011, when HbA1c is used as a screening test:   >=6.5%  Diagnostic of Diabetes Mellitus           (if abnormal result  is confirmed)  5.7-6.4%   Increased risk of developing Diabetes Mellitus  References:Diagnosis and Classification of Diabetes Mellitus,Diabetes WPVX,4801,65(VVZSM 1):S62-S69 and Standards of Medical Care in         Diabetes - 2011,Diabetes Care,2011,34  (Suppl 1):S11-S61.   Lab Results  Component Value Date   LDLCALC 55 02/28/2015   CREATININE 1.79* 03/15/2015       Scheduled Meds: . sodium chloride   Intravenous Once  . sodium chloride   Intravenous Once  . atorvastatin  80 mg Oral q1800  . cholecalciferol  1,000 Units Oral Daily  . clopidogrel  75 mg Oral Daily  . doxazosin  2 mg Oral QHS  . ezetimibe  10 mg Oral Daily  . levothyroxine  88 mcg Oral QAC breakfast  . metoprolol tartrate  12.5 mg Oral BID  . sodium chloride  3 mL Intravenous Q12H   Continuous Infusions: . dextrose 5 % and 0.45% NaCl 75 mL/hr at 03/14/15 0008    Principal Problem:   Gross hematuria Active Problems:   Hypothyroidism   Obesity, Class III, BMI 40-49.9 (morbid obesity)   RENAL DISEASE, CHRONIC, STAGE III   Acute MI inferior posterior subsequent episode care   Bleeding    Time spent: 45 minutes   [facility] Pager [phone]. If 7PM-7AM, please contact night-coverage at [email or web address], password [facility] 03/15/2015, 11:04 AM  LOS: 3 days

## 2015-03-15 NOTE — Progress Notes (Signed)
Patient Name: Richard Warner Date of Encounter: 03/15/2015  Primary Cardiologist: Burt Knack   Principal Problem:   Gross hematuria Active Problems:   Hypothyroidism   Obesity, Class III, BMI 40-49.9 (morbid obesity)   RENAL DISEASE, CHRONIC, STAGE III   Acute MI inferior posterior subsequent episode care   Bleeding    SUBJECTIVE  Denies any CP or SOB. Feeling well, still has small amount of blood clot in urine, but much better   CURRENT MEDS . sodium chloride   Intravenous Once  . sodium chloride   Intravenous Once  . atorvastatin  80 mg Oral q1800  . cholecalciferol  1,000 Units Oral Daily  . clopidogrel  75 mg Oral Daily  . doxazosin  2 mg Oral QHS  . ezetimibe  10 mg Oral Daily  . levothyroxine  88 mcg Oral QAC breakfast  . metoprolol tartrate  12.5 mg Oral BID  . sodium chloride  3 mL Intravenous Q12H    OBJECTIVE  Filed Vitals:   03/14/15 1225 03/14/15 1556 03/14/15 2100 03/15/15 0500  BP: 125/73 141/64 123/63 130/52  Pulse: 95 99 98 93  Temp: 98.3 F (36.8 C) 98.5 F (36.9 C) 98.5 F (36.9 C) 98.1 F (36.7 C)  TempSrc: Oral Oral    Resp: 16 16 18 17   Height:      Weight:    352 lb 11.2 oz (159.984 kg)  SpO2: 100% 99% 98% 97%    Intake/Output Summary (Last 24 hours) at 03/15/15 0944 Last data filed at 03/14/15 2230  Gross per 24 hour  Intake    575 ml  Output    550 ml  Net     25 ml   Filed Weights   03/13/15 0500 03/14/15 0500 03/15/15 0500  Weight: 358 lb 3.2 oz (162.478 kg) 352 lb 8 oz (159.893 kg) 352 lb 11.2 oz (159.984 kg)    PHYSICAL EXAM  General: Pleasant, NAD. Neuro: Alert and oriented X 3. Moves all extremities spontaneously. Psych: Normal affect. HEENT:  Normal  Neck: Supple without bruits or JVD. Lungs:  Resp regular and unlabored, CTA. Heart: RRR no s3, s4, or murmurs. Abdomen: Soft, non-tender, non-distended, BS + x 4.  Extremities: No clubbing, cyanosis or edema. DP/PT/Radials 2+ and equal bilaterally.  Accessory  Clinical Findings  CBC  Recent Labs  03/12/15 1023  03/14/15 0406 03/15/15 0445  WBC 4.8  < > 4.9 4.2  NEUTROABS 4.1  --   --   --   HGB 5.9*  < > 7.7* 7.7*  HCT 18.4*  < > 23.5* 23.8*  MCV 77.3*  < > 80.2 80.4  PLT 219  < > 193 180  < > = values in this interval not displayed. Basic Metabolic Panel  Recent Labs  03/14/15 0406 03/15/15 0445  NA 137 137  K 3.7 3.4*  CL 106 107  CO2 24 23  GLUCOSE 126* 141*  BUN 25* 22*  CREATININE 1.83* 1.79*  CALCIUM 7.8* 7.8*   TELE NSR with HR 70-90s, occasional HR in 130s    ECG  No new EKG  Echocardiogram 03/01/2015  - Left ventricle: The cavity size was normal. Wall thickness was normal. Systolic function was normal. The estimated ejection fraction was in the range of 50% to 55%. Features are consistent with a pseudonormal left ventricular filling pattern, with concomitant abnormal relaxation and increased filling pressure (grade 2 diastolic dysfunction). - Left atrium: The atrium was mildly dilated.  Impressions:  - The echo is  extremely poor quality. Accurate assessment of the valves cannot be made by this echo. The LV function can be seen to be fairly normal using definity contrast.    Radiology/Studies  Dg Chest 2 View  02/27/2015   CLINICAL DATA:  Chest pain and shortness of breath. History of rectal carcinoma.  EXAM: CHEST - 2 VIEW  COMPARISON:  09/13/2014  FINDINGS: Stable cardiac enlargement. Lung volumes are low with bibasilar atelectasis present. There is no evidence of pulmonary edema, consolidation, pneumothorax, nodule or pleural fluid. Stable osteopenia and spondylosis of the thoracic spine.  IMPRESSION: Low volumes with bibasilar atelectasis.  No active disease.   Electronically Signed   By: Aletta Edouard M.D.   On: 02/27/2015 11:28   Ct Head Wo Contrast  02/27/2015   CLINICAL DATA:  Prior low anterior resection of rectal carcinoma in May, 2013. Recent TURBT 02/23/2015 for  transitional cell carcinoma. Prior history of melanoma. Patient presents with a generalized headache which began acutely earlier today. Patient had a similar episode 2 days ago.  EXAM: CT HEAD WITHOUT CONTRAST  TECHNIQUE: Contiguous axial images were obtained from the base of the skull through the vertex without intravenous contrast.  COMPARISON:  None.  FINDINGS: Ventricular system normal in size and appearance for age. No mass lesion. No midline shift. No acute hemorrhage or hematoma. No extra-axial fluid collections. No evidence of acute infarction. No focal brain parenchymal abnormality.  No skull fracture or other focal osseous abnormality involving the skull. Visualized paranasal sinuses, bilateral mastoid air cells and bilateral middle ear cavities well-aerated. Minimal bilateral carotid siphon atherosclerosis.  IMPRESSION: Normal intracranially.   Electronically Signed   By: Evangeline Dakin M.D.   On: 02/27/2015 11:15    ASSESSMENT AND PLAN  1. Severe acute blood loss anemia due to bleeding from urinary tract  - hgb 4.6 on arrival 03/11/2015. Hgb continue to trend down after transfusion, receiving additional PRBC  - urology consulted, felt bleeding likely from triple therapy with ASA, plavix and coumadin and from edge of previous TUR  - holding ASA and coumadin, continue on plavix given recent MI. Encouraged ambulation given no DVT prophylaxis and h/o DVT/PE, SCD at night.    2. Recent STEMI 02/27/15 with DES to OM1, residual moderate diffuse 3V CAD, EF 50-55%  3. 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 Morbid obesity Body mass index is 49.19 kg/(m^2).  5. Rectal cancer and bladder cancer - recently underwent TURBT; pathology c/w adenocarcinoma indicative of metastatic rectal CA  6. AKI on CKD stage III (prior Cr 1.5-1.7)   Signed, Almyra Deforest PA-C Pager: [phone]   Patient seen and examined. Agree with assessment and plan. Now off coumadin. INR yesterday 1.29.  Sinus rhythm at  90; BP 300 systolic.  Consider increasing metoprolol to 25 mg bid. No chest pain with walking. Appreciate Dr. Gearldine Shown evaluation. H 7.7 post tx yesterday.   Troy Sine, MD, [facility] 03/15/2015 2:34 PM

## 2015-03-15 NOTE — Evaluation (Signed)
Physical Therapy Evaluation Patient Details Name: JOVAUGHN WOJTASZEK MRN: [id number] DOB: [birth year] Today's Date: 03/15/2015   History of Present Illness  64 year old obese male with chronic kidney disease, rectal cancer status post hemicolectomy, recent ST EMI status post stenting on aspirin, Coumadin and Plavix admitted with gross hematuria and severe anemia. Patient has received a total of 7 units PRBC (received one unit today) and having ongoing hematuria with clots.  Clinical Impression  Pt presents with above.  Note mild generalized weakness, however pt with c/o of fatigue due to being up since 330am. Pt is mod I overall for mobility and will not require any follow up services.  PT to sign off at this time.     Follow Up Recommendations No PT follow up    Equipment Recommendations  None recommended by PT    Recommendations for Other Services       Precautions / Restrictions Precautions Precautions: None Precaution Comments: blood clots in urine Restrictions Weight Bearing Restrictions: No      Mobility  Bed Mobility Overal bed mobility: Modified Independent                Transfers Overall transfer level: Modified independent                  Ambulation/Gait Ambulation/Gait assistance: Modified independent (Device/Increase time);Supervision Ambulation Distance (Feet): 200 Feet Assistive device: None Gait Pattern/deviations: WFL(Within Functional Limits)     General Gait Details: Pt with slower than normal gait speed, S initially for safety progressing to mod I   Stairs            Wheelchair Mobility    Modified Rankin (Stroke Patients Only)       Balance Overall balance assessment: Modified Independent                                           Pertinent Vitals/Pain Pain Assessment: No/denies pain    Home Living Family/patient expects to be discharged to:: Private residence Living Arrangements: Parent Available  Help at Discharge: Family;Available 24 hours/day Type of Home: House Home Access: Stairs to enter Entrance Stairs-Rails: Left Entrance Stairs-Number of Steps: 5 Home Layout: One level Home Equipment: None      Prior Function Level of Independence: Independent               Hand Dominance        Extremity/Trunk Assessment   Upper Extremity Assessment: Overall WFL for tasks assessed           Lower Extremity Assessment: Overall WFL for tasks assessed      Cervical / Trunk Assessment: Normal  Communication   Communication: No difficulties  Cognition Arousal/Alertness: Awake/alert Behavior During Therapy: WFL for tasks assessed/performed Overall Cognitive Status: Within Functional Limits for tasks assessed                      General Comments      Exercises        Assessment/Plan    PT Assessment Patent does not need any further PT services  PT Diagnosis Generalized weakness   PT Problem List    PT Treatment Interventions     PT Goals (Current goals can be found in the Care Plan section) Acute Rehab PT Goals Patient Stated Goal: to get some rest    Frequency     Barriers  to discharge        Co-evaluation               End of Session   Activity Tolerance: Patient limited by fatigue Patient left: with call bell/phone within reach (on bed side commode) Nurse Communication: Mobility status         Time: 1002-1015 PT Time Calculation (min) (ACUTE ONLY): 13 min   Charges:   PT Evaluation $Initial PT Evaluation Tier I: 1 Procedure     PT G CodesDenice Bors 03/15/2015, 10:22 AM

## 2015-03-15 NOTE — Progress Notes (Signed)
IP PROGRESS NOTE  Subjective:   Mr. Chrostowski or he developed increased hematuria with passage of clots last week. He presented to his cardiologist on 03/11/2015 and reported malaise and severe exertional dyspnea. The hemoglobin returned at 4.6 and he was admitted for transfusion support. The PT/INR returned at 1.92 on 03/11/2015. Coumadin was discontinued.  He reports feeling better after multiple red cell transfusions. He continues to have gross hematuria and passed a clot this morning. No other complaint.  Objective: Vital signs in last 24 hours: Blood pressure 130/52, pulse 93, temperature 98.1 F (36.7 C), temperature source Oral, resp. rate 17, height 5\' 11"  (1.803 m), weight 352 lb 11.2 oz (159.984 kg), SpO2 97 %.  Intake/Output from previous day: 08/08 0701 - 08/09 0700 In: 815 [P.O.:480; Blood:335] Out: 550 [Urine:550]  Physical Exam: Lungs: Clear bilaterally Cardiac: Regular rate and rhythm Abdomen: Obese, nontender, no mass Extremities: Chronic stasis change at the low leg bilaterally     Lab Results:  Recent Labs  03/14/15 0406 03/15/15 0445  WBC 4.9 4.2  HGB 7.7* 7.7*  HCT 23.5* 23.8*  PLT 193 180   PT/INR 1.29 on 03/14/2015 BMET  Recent Labs  03/15/15 0445 03/15/15 1200  NA 137 137  K 3.4* 3.7  CL 107 107  CO2 23 21*  GLUCOSE 141* 120*  BUN 22* 21*  CREATININE 1.79* 1.67*  CALCIUM 7.8* 8.0*    Studies/Results: No results found.  Medications: I have reviewed the patient's current medications.  Assessment/Plan:  1. Rectal cancer, stage II (pT3 pN0) status post low anterior resection 12/14/2011. The tumor was noted to be at 10 cm on a rigid proctoscopy at the time of surgery. He completed radiation and Xeloda from 01/31/2012-03/11/2012. He began a first cycle of adjuvant Xeloda on 04/22/2012. He completed cycle 5 beginning 07/17/2012. He completed cycle 6 beginning 08/08/2012.  Surveillance colonoscopy 05/15/2013, status post removal of  tubular adenomas. Next colonoscopy 3 years.  Elevated CEA 06/18/2014  PET scan 06/30/2014 with mild focal hypermetabolism at the anorectal junction favored to be physiologic in the absence of associated CT abnormality; irregular bladder wall thickening.  Colonoscopy 07/27/2014 with removal of a sessile ascending colon polyp-tubular adenoma, no other significant findings  PET scan 12/27/2014: status post low anterior resection. No evidence of hypermetabolic recurrent or metastatic disease. Similar hypermetabolism about the anus.  Lower endoscopic ultrasound 01/06/2015 with findings of nonspecific 4-5 mm thickening of the rectal wall just distal to the LAR anastomosis. Fine-needle aspiration was negative for malignant cells. 2. History of iron deficiency anemia likely secondary to rectal bleeding.  3. 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 He is maintained on Coumadin.  4. Renal insufficiency.  5. Hypertension. 6. Chronic mild thrombocytopenia 7. Hypothyroid. 8. Microscopic hematuria on a urinalysis 07/02/2014  Evaluated by Dr. Janice Norrie including a cystoscopic biopsy of an erythematous/edematous area of the bladder on 12/03/2014-negative for malignancy  Repeat cystoscopy confirmed a 3 cm anterior bladder mass, status post cystoscopic resection 02/23/2015 with the pathology confirming metastatic colorectal adenocarcinoma 9. Right anterior chest wall skin lesion/mass status post excision 09/20/2014. 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 Margins not involved. No evidence of angiolymphatic invasion or perineural invasion. 10. Gross hematuria, intermittent since bladder biopsy 12/03/2014. Now with severe hematuria 11. Admission 02/27/2015 with an acute myocardial infarction, status post a PCI and DES of first marginal lesion 12. Admission 03/11/2015 with severe symptomatic anemia secondary to gross hematuria  Mr. Clos has been  diagnosed with recurrent rectal cancer  involving the bladder. There is no other evidence of metastatic disease after an extensive staging evaluation. He was admitted with symptomatically anemia related to bleeding from the bladder while on anticoagulation therapy. The bleeding has slowed with discontinuation of Coumadin.  Mr. Summer is at increased risk of developing recurrent venous thromboembolic disease in the setting of morbid obesity and recurrent rectal cancer.  I discussed the case with Dr. Janice Norrie. He says it is possible that a repeat cystoscopic intervention could stop the bleeding. For long-term control of bleeding we need to consider resection of the bladder mass or palliative chemotherapy/radiation.  I attempted to contact the surgical oncology service, Dr. Cloyd Stagers, at [facility]. He is out until 03/21/2015. He will not be a surgical candidate for at least several months with the recent myocardial infarction.  Recommendations: 1. Hold Coumadin anticoagulation 2. Start ferrous sulfate 3. Transfuse packed red blood cells as needed for symptomatic anemia 4. Urology consult for repeat cystoscopy if bleeding persists with correction of the INR 5. I will consult radiation oncology to consider palliative radiation to the bladder with sensitizing chemotherapy.   LOS: 3 days   Richard Warner  03/15/2015, 2:03 PM

## 2015-03-16 ENCOUNTER — Other Ambulatory Visit: Payer: Medicare Other

## 2015-03-16 ENCOUNTER — Ambulatory Visit
Admit: 2015-03-16 | Discharge: 2015-03-16 | Disposition: A | Discharge status: Home / Self Care | Payer: Medicare Other | Source: Ambulatory Visit | Attending: Radiation Oncology | Admitting: Radiation Oncology

## 2015-03-16 ENCOUNTER — Encounter: Payer: Self-pay | Admitting: Radiation Oncology

## 2015-03-16 ENCOUNTER — Ambulatory Visit: Payer: Medicare Other | Admitting: Nurse Practitioner

## 2015-03-16 DIAGNOSIS — Z510 Encounter for antineoplastic radiation therapy: Secondary | ICD-10-CM | POA: Insufficient documentation

## 2015-03-16 DIAGNOSIS — C20 Malignant neoplasm of rectum: Secondary | ICD-10-CM | POA: Insufficient documentation

## 2015-03-16 DIAGNOSIS — C7911 Secondary malignant neoplasm of bladder: Secondary | ICD-10-CM | POA: Insufficient documentation

## 2015-03-16 LAB — CBC
HCT: 24.1 % — ABNORMAL LOW (ref 39.0–52.0)
HEMOGLOBIN: 7.7 g/dL — AB (ref 13.0–17.0)
MCH: 26.4 pg (ref 26.0–34.0)
MCHC: 32 g/dL (ref 30.0–36.0)
MCV: 82.5 fL (ref 78.0–100.0)
Platelets: 177 10*3/uL (ref 150–400)
RBC: 2.92 MIL/uL — ABNORMAL LOW (ref 4.22–5.81)
RDW: 17.1 % — AB (ref 11.5–15.5)
WBC: 3.9 10*3/uL — AB (ref 4.0–10.5)

## 2015-03-16 LAB — COMPREHENSIVE METABOLIC PANEL
ALT: 16 U/L — AB (ref 17–63)
ANION GAP: 9 (ref 5–15)
AST: 17 U/L (ref 15–41)
Albumin: 2.6 g/dL — ABNORMAL LOW (ref 3.5–5.0)
Alkaline Phosphatase: 45 U/L (ref 38–126)
BUN: 17 mg/dL (ref 6–20)
CALCIUM: 7.8 mg/dL — AB (ref 8.9–10.3)
CO2: 21 mmol/L — AB (ref 22–32)
Chloride: 109 mmol/L (ref 101–111)
Creatinine, Ser: 1.63 mg/dL — ABNORMAL HIGH (ref 0.61–1.24)
GFR calc Af Amer: 50 mL/min — ABNORMAL LOW (ref >60)
GFR, EST NON AFRICAN AMERICAN: 43 mL/min — AB (ref >60)
GLUCOSE: 124 mg/dL — AB (ref 65–99)
Potassium: 3.8 mmol/L (ref 3.5–5.1)
SODIUM: 139 mmol/L (ref 135–145)
Total Bilirubin: 0.8 mg/dL (ref 0.3–1.2)
Total Protein: 4.7 g/dL — ABNORMAL LOW (ref 6.5–8.1)

## 2015-03-16 NOTE — Progress Notes (Signed)
Patient Name: Richard Warner Date of Encounter: 03/16/2015  Primary Cardiologist: Burt Knack   Principal Problem:   Gross hematuria Active Problems:   Hypothyroidism   Obesity, Class III, BMI 40-49.9 (morbid obesity)   RENAL DISEASE, CHRONIC, STAGE III   Acute MI inferior posterior subsequent episode care   Bleeding   CAD in native artery    SUBJECTIVE  Denies any CP or SOB. Slept well last night  CURRENT MEDS . sodium chloride   Intravenous Once  . sodium chloride   Intravenous Once  . atorvastatin  80 mg Oral q1800  . cholecalciferol  1,000 Units Oral Daily  . clopidogrel  75 mg Oral Daily  . doxazosin  2 mg Oral QHS  . ezetimibe  10 mg Oral Daily  . ferrous sulfate  325 mg Oral TID WC  . levothyroxine  88 mcg Oral QAC breakfast  . metoprolol tartrate  12.5 mg Oral BID  . sodium chloride  3 mL Intravenous Q12H    OBJECTIVE  Filed Vitals:   03/15/15 0500 03/15/15 1610 03/15/15 2041 03/16/15 0500  BP: 130/52 124/57 120/57 115/50  Pulse: 93 87 96 87  Temp: 98.1 F (36.7 C) 98.3 F (36.8 C) 98.5 F (36.9 C) 98.3 F (36.8 C)  TempSrc:  Oral Oral Oral  Resp: 17 18    Height:      Weight: 352 lb 11.2 oz (159.984 kg)     SpO2: 97% 97% 98% 97%    Intake/Output Summary (Last 24 hours) at 03/16/15 0755 Last data filed at 03/16/15 0600  Gross per 24 hour  Intake   1450 ml  Output    550 ml  Net    900 ml   Filed Weights   03/13/15 0500 03/14/15 0500 03/15/15 0500  Weight: 358 lb 3.2 oz (162.478 kg) 352 lb 8 oz (159.893 kg) 352 lb 11.2 oz (159.984 kg)    PHYSICAL EXAM  General: Pleasant, NAD. Neuro: Alert and oriented X 3. Moves all extremities spontaneously. Psych: Normal affect. HEENT:  Normal  Neck: Supple without bruits or JVD. Lungs:  Resp regular and unlabored, CTA. Heart: RRR no s3, s4, or murmurs. Abdomen: Soft, non-tender, non-distended, BS + x 4.  Extremities: No clubbing, cyanosis or edema. DP/PT/Radials 2+ and equal  bilaterally.  Accessory Clinical Findings  CBC  Recent Labs  03/15/15 0445 03/16/15 0528  WBC 4.2 3.9*  HGB 7.7* 7.7*  HCT 23.8* 24.1*  MCV 80.4 82.5  PLT 180 154   Basic Metabolic Panel  Recent Labs  03/15/15 1200 03/16/15 0528  NA 137 139  K 3.7 3.8  CL 107 109  CO2 21* 21*  GLUCOSE 120* 124*  BUN 21* 17  CREATININE 1.67* 1.63*  CALCIUM 8.0* 7.8*   TELE NSR with HR 70-90s, occasional HR in 120s    ECG  No new EKG  Echocardiogram 03/01/2015  - Left ventricle: The cavity size was normal. Wall thickness was normal. Systolic function was normal. The estimated ejection fraction was in the range of 50% to 55%. Features are consistent with a pseudonormal left ventricular filling pattern, with concomitant abnormal relaxation and increased filling pressure (grade 2 diastolic dysfunction). - Left atrium: The atrium was mildly dilated.  Impressions:  - The echo is extremely poor quality. Accurate assessment of the valves cannot be made by this echo. The LV function can be seen to be fairly normal using definity contrast.    Radiology/Studies  Dg Chest 2 View  02/27/2015  CLINICAL DATA:  Chest pain and shortness of breath. History of rectal carcinoma.  EXAM: CHEST - 2 VIEW  COMPARISON:  09/13/2014  FINDINGS: Stable cardiac enlargement. Lung volumes are low with bibasilar atelectasis present. There is no evidence of pulmonary edema, consolidation, pneumothorax, nodule or pleural fluid. Stable osteopenia and spondylosis of the thoracic spine.  IMPRESSION: Low volumes with bibasilar atelectasis.  No active disease.   Electronically Signed   By: Aletta Edouard M.D.   On: 02/27/2015 11:28   Ct Head Wo Contrast  02/27/2015   CLINICAL DATA:  Prior low anterior resection of rectal carcinoma in May, 2013. Recent TURBT 02/23/2015 for transitional cell carcinoma. Prior history of melanoma. Patient presents with a generalized headache which began acutely  earlier today. Patient had a similar episode 2 days ago.  EXAM: CT HEAD WITHOUT CONTRAST  TECHNIQUE: Contiguous axial images were obtained from the base of the skull through the vertex without intravenous contrast.  COMPARISON:  None.  FINDINGS: Ventricular system normal in size and appearance for age. No mass lesion. No midline shift. No acute hemorrhage or hematoma. No extra-axial fluid collections. No evidence of acute infarction. No focal brain parenchymal abnormality.  No skull fracture or other focal osseous abnormality involving the skull. Visualized paranasal sinuses, bilateral mastoid air cells and bilateral middle ear cavities well-aerated. Minimal bilateral carotid siphon atherosclerosis.  IMPRESSION: Normal intracranially.   Electronically Signed   By: Evangeline Dakin M.D.   On: 02/27/2015 11:15    ASSESSMENT AND PLAN  1. Severe acute blood loss anemia due to bleeding from urinary tract  - hgb 4.6 on arrival 03/11/2015. Hgb continue to trend down after transfusion, receiving additional PRBC  - urology consulted, felt bleeding likely from triple therapy with ASA, plavix and coumadin and from edge of previous TUR. Dr. Tammi Klippel reviewing the case, likely transfer to [facility] for radiotherapy  - ASA and coumadin on hold, continue on plavix given recent MI. Encouraged ambulation given no DVT prophylaxis and h/o DVT/PE, SCD at night.    - stable from cardiology perspective to transfer to [facility], once patient is no longer bleeding, maybe beneficial to restart low dose 81mg  ASA as he received his last stent on 7/24. Likely can avoid restarting coumadin in the future as long as he is ambulatory (place on SCD at night while in the hospital). Per pt, he only had 1 episode of both DVT and PE in 2010  2. Recent STEMI 02/27/15 with DES to OM1, residual moderate diffuse 3V CAD, EF 50-55%  3. 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 Morbid obesity Body mass index is 49.19 kg/(m^2).  5. Rectal cancer and  bladder cancer - recently underwent TURBT; pathology c/w adenocarcinoma indicative of metastatic rectal CA  6. AKI on CKD stage III (prior Cr 1.5-1.7)   Signed, Almyra Deforest PA-C Pager: [phone]   Patient seen and examined. Agree with assessment and plan. Cardiac status appears stable. Still with hematuria with clots being passed. For transfer to [facility] today to start radiation therapy this afternoon. Agree with avoiding coumadin but resume ASA 81 mg  alone once stable   Troy Sine, MD, [facility] 03/16/2015 11:24 AM

## 2015-03-16 NOTE — Progress Notes (Signed)
IP PROGRESS NOTE  Subjective:   Richard Warner continues to have gross hematuria with clots, though this has lessened. He was able to ambulate in the hall yesterday.     Objective: Vital signs in last 24 hours: Blood pressure 123/54, pulse 94, temperature 98.2 F (36.8 C), temperature source Oral, resp. rate 19, height 5\' 11"  (1.803 m), weight 352 lb 11.2 oz (159.984 kg), SpO2 100 %.  Intake/Output from previous day: 08/09 0701 - 08/10 0700 In: 1450 [P.O.:500; I.V.:950] Out: 550 [Urine:550]  Physical Exam: Not performed today     Lab Results:  Recent Labs  03/15/15 0445 03/16/15 0528  WBC 4.2 3.9*  HGB 7.7* 7.7*  HCT 23.8* 24.1*  PLT 180 177   PT/INR 1.29 on 03/14/2015 BMET  Recent Labs  03/15/15 1200 03/16/15 0528  NA 137 139  K 3.7 3.8  CL 107 109  CO2 21* 21*  GLUCOSE 120* 124*  BUN 21* 17  CREATININE 1.67* 1.63*  CALCIUM 8.0* 7.8*    Studies/Results: No results found.  Medications: I have reviewed the patient's current medications.  Assessment/Plan:  1. Rectal cancer, stage II (pT3 pN0) status post low anterior resection 12/14/2011. The tumor was noted to be at 10 cm on a rigid proctoscopy at the time of surgery. He completed radiation and Xeloda from 01/31/2012-03/11/2012. He began a first cycle of adjuvant Xeloda on 04/22/2012. He completed cycle 5 beginning 07/17/2012. He completed cycle 6 beginning 08/08/2012.  Surveillance colonoscopy 05/15/2013, status post removal of tubular adenomas. Next colonoscopy 3 years.  Elevated CEA 06/18/2014  PET scan 06/30/2014 with mild focal hypermetabolism at the anorectal junction favored to be physiologic in the absence of associated CT abnormality; irregular bladder wall thickening.  Colonoscopy 07/27/2014 with removal of a sessile ascending colon polyp-tubular adenoma, no other significant findings  PET scan 12/27/2014: status post low anterior resection. No evidence of hypermetabolic recurrent or  metastatic disease. Similar hypermetabolism about the anus.  Lower endoscopic ultrasound 01/06/2015 with findings of nonspecific 4-5 mm thickening of the rectal wall just distal to the LAR anastomosis. Fine-needle aspiration was negative for malignant cells. 2. History of iron deficiency anemia likely secondary to rectal bleeding.  3. 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 He is maintained on Coumadin.  4. Renal insufficiency.  5. Hypertension. 6. Chronic mild thrombocytopenia 7. Hypothyroid. 8. Microscopic hematuria on a urinalysis 07/02/2014  Evaluated by Dr. Janice Norrie including a cystoscopic biopsy of an erythematous/edematous area of the bladder on 12/03/2014-negative for malignancy  Repeat cystoscopy confirmed a 3 cm anterior bladder mass, status post cystoscopic resection 02/23/2015 with the pathology confirming metastatic colorectal adenocarcinoma 9. Right anterior chest wall skin lesion/mass status post excision 09/20/2014. 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 Margins not involved. No evidence of angiolymphatic invasion or perineural invasion. 10. Gross hematuria, intermittent since bladder biopsy 12/03/2014. Now with severe hematuria 11. Admission 02/27/2015 with an acute myocardial infarction, status post a PCI and DES of first marginal lesion 12. Admission 03/11/2015 with severe symptomatic anemia secondary to gross hematuria  Richard Warner continues to have gross hematuria. I discussed the case with Drs. Manning and [employer].  Dr. Janice Norrie will contact the urology service at [facility] to discuss the indication for an attempt at resecting the bladder mass versus a cystoscopic intervention. The plan is to begin palliative radiation if he is not transferred to [facility]. I will and sensitizing 5-fluorouracil chemotherapy.  Recommendations: 1. Hold Coumadin anticoagulation 2. Continue ferrous sulfate 3. Transfuse packed red blood cells  as  needed for symptomatic anemia-will likely need another transfusion over the next few days 4. Proceed with cystoscopic intervention and radiation pending discussion of Dr.Nesi with the urology service at [facility]. I will add sensitizing 5-fluorouracil if he starts a course of radiation.   LOS: 4 days   Amayrani Bennick  03/16/2015, 2:04 PM

## 2015-03-16 NOTE — Progress Notes (Addendum)
[facility] Simulation and Treatment Planning Note   Name: Richard Warner MRN: [id number]  Date: 03/16/2015   DOB: October 30, 1950  Status: inpatient    DIAGNOSIS: Recurrent rectal cancer of the bladder.    CONSENT VERIFIED: yes   SET UP AND IMMOBILIZATION: Patient is setup supine with arms in a wing board.   NARRATIVE: The patient was brought to the [facility].  Identity was confirmed.  All relevant records and images related to the planned course of therapy were reviewed.  Then, the patient was positioned in a stable reproducible clinical set-up for radiation therapy.  CT images were obtained.  Skin markings were placed.  The CT images were loaded into the planning software where the target and avoidance structures were contoured.  The radiation prescription was entered and confirmed.   TREATMENT PLANNING NOTE:  Treatment planning then occurred. I have requested 3D simulation with [facility] of the spinal cord, total lungs and gross tumor volume. I have also requested mlcs and an isodose plan.   Special treatment procedure will be performed as [name] will be receiving re-irradiation within his previously treated pelvis.   I have ordered a consult with the dietician for monitoring.  I will also be verifying that weekly lab values are appropriate.   ------------------------------------------------  Sheral Apley. Tammi Klippel, M.D.    This document serves as a record of services personally performed by Ledon Snare, MD. It was created on her behalf by Darcus Austin, a trained medical scribe. The creation of this record is based on the scribe's personal observations and the provider's statements to them. This document has been checked and approved by the attending provider.

## 2015-03-16 NOTE — Progress Notes (Signed)
Called Report to [facility] [place], Therapist, sports.

## 2015-03-16 NOTE — Progress Notes (Signed)
Report given to transport. Transport to transfer pt [facility]. Patient transferring now, will arrive for 14:30 radiation treatment at [facility]

## 2015-03-16 NOTE — Progress Notes (Signed)
[facility] PROGRESS NOTE  TERRI MALERBA ZMO:[id number] DOB: 29-Mar-1951 DOA: 03/11/2015 PCP: Rachell Cipro, MD  Assessment/Plan: Principal Problem:   Gross hematuria Active Problems:   Hypothyroidism   Obesity, Class III, BMI 40-49.9 (morbid obesity)   RENAL DISEASE, CHRONIC, STAGE III   Acute MI inferior posterior subsequent episode care   Bleeding   CAD in native artery   Cardiology has requested that [facility] assume the role of primary inpatient physician.  Gross hematuria-improving Secondary to a combination of recent TURBT (7/20) and anti-platelet plus anticoagulation therapy. Baseline Hgb 15.1 on 4/21 and 9.5 on 7/24. Current Hgb status post 6 units of PRBCs is 7.7, stable for 2 days  INR 1.29 today Per cardiology at his absolute necessary to continue Plavix given DES placement on 7/24. Aspirin and Coumadin have been discontinued. Once the patient is no longer bleeding, go ahead and restart aspirin 81 mg a day as his last stent was on 7/24. Avoid restarting Coumadin in the future as long as his ambulatory. Per pt, he only had 1 episode of both DVT and PE in 2010  Dr. Benay Spice requested Tyler Pita, MD, to evaluate patient for palliative radiation therapy for hemostasis. Patient is being transferred to [facility] for radiation therapy starting at 4 PM today Appreciate urology consultation and recommendations. They recommend against foley or cystoscopy at this time due to risk of trauma.    Acute blood loss anemia due to bleeding from urinary tract - hgb 4.6 on arrival 03/11/2015. Hgb continue to trend down after transfusion, receiving additional PRBC.urology consulted, felt bleeding likely from triple therapy with ASA, plavix and coumadin and from edge of previous TUR. holding ASA and coumadin, continue on plavix given recent MI. Transfuse for hemoglobin less than 7.5  STEMI on 7/24 Cardiology managing. Patient on Plavix,  atorvastatin, Zetia, metoprolol.  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 Discussed with Dr. Benay Spice of oncology who will see the patient on 8/9  Hypothyroidism On levothyroxine  Chronic kidney disease Creatinine currently 1.79 baseline appears to be about 1.5. Bump in creatinine is likely due to severe anemia. I do not see any nephrotoxic medications in use.  Morbid obesity Body mass index is 49.21 kg/(m^2).    Code Status:      Code Status Orders        Start     Ordered   03/11/15 1618  Full code   Continuous     03/11/15 1617     Family Communication: family updated about patient's clinical progress Disposition Plan:  As above    Brief narrative: 64 year old male with hypertension, chronic kidney disease, rectal cancer, and recent STEMI, presents from cardiology office with weakness and shortness of breath on 8/5. Hemoglobin was 4.6. Per the patient report he was diagnosed with PE/DVT in 2011 and placed on Coumadin. He remained on Coumadin and in 2013 he was diagnosed with colon cancer. He underwent hemicolectomy and did well. In 2016 his CEA was elevated Dr. Benay Spice recommended a prostate biopsy. The first biopsy was negative for cancer but the patient bled for 3 weeks after having it done. Despite a negative biopsy the CEA was still elevated and a second prostate biopsy was done on 7/20 (which was positive for adenocarcinoma). On 7/24 the patient had a STEMI. A drug-eluting stent was inserted and he was placed  on aspirin/Plavix/Coumadin therapy. On Thursday 7/28 the patient began to have gross hematuria. He was not in any pain or distress so he waited until his appointment on 8/5 to seek medical attention. At his cardiology appointment he was found to be pale, weak, and short of breath and was sent to the hospital for  admission. Since admission he has received 6 units of packed red blood cells. He is receiving his seventh unit today. He continues to have hematuria with clots. No Foley has been placed due to concern for possible trauma. Urology is consulting and recommends tracking post void residuals to ensure he does not become obstructed due to clots. His INR on admission was 1.92.    Consultants:  Hematology oncology, urology, cardiology  Procedures:  None  Antibiotics: Anti-infectives    None         HPI/Subjective: Denies any CP or SOB. Feeling well, still has small amount of blood clot in urine, but much better   Objective: Filed Vitals:   03/15/15 1610 03/15/15 2041 03/16/15 0500 03/16/15 1007  BP: 124/57 120/57 115/50 107/61  Pulse: 87 96 87 97  Temp: 98.3 F (36.8 C) 98.5 F (36.9 C) 98.3 F (36.8 C)   TempSrc: Oral Oral Oral   Resp: 18     Height:      Weight:      SpO2: 97% 98% 97%     Intake/Output Summary (Last 24 hours) at 03/16/15 1038 Last data filed at 03/16/15 1010  Gross per 24 hour  Intake 2022.5 ml  Output    550 ml  Net 1472.5 ml    Exam:  General: Pleasant, NAD. Neuro: Alert and oriented X 3. Moves all extremities spontaneously. Psych: Normal affect. HEENT: Normal Neck: Supple without bruits or JVD. Lungs: Resp regular and unlabored, CTA. Heart: RRR no s3, s4, or murmurs. Abdomen: Soft, non-tender, non-distended, BS + x 4.  Extremities: No clubbing, cyanosis or edema. DP/PT/Radials 2+ and equal bilaterally.    Data Review   Micro Results Recent Results (from the past 240 hour(s))  Urine culture     Status: None   Collection Time: 03/11/15  5:44 PM  Result Value Ref Range Status   Specimen Description URINE, CLEAN CATCH  Final   Special Requests NONE  Final   Culture MULTIPLE SPECIES PRESENT, SUGGEST RECOLLECTION  Final   Report Status 03/13/2015 FINAL  Final    Radiology Reports Dg Chest 2 View  02/27/2015   CLINICAL  DATA:  Chest pain and shortness of breath. History of rectal carcinoma.  EXAM: CHEST - 2 VIEW  COMPARISON:  09/13/2014  FINDINGS: Stable cardiac enlargement. Lung volumes are low with bibasilar atelectasis present. There is no evidence of pulmonary edema, consolidation, pneumothorax, nodule or pleural fluid. Stable osteopenia and spondylosis of the thoracic spine.  IMPRESSION: Low volumes with bibasilar atelectasis.  No active disease.   Electronically Signed   By: Aletta Edouard M.D.   On: 02/27/2015 11:28   Ct Head Wo Contrast  02/27/2015   CLINICAL DATA:  Prior low anterior resection of rectal carcinoma in May, 2013. Recent TURBT 02/23/2015 for transitional cell carcinoma. Prior history of melanoma. Patient presents with a generalized headache which began acutely earlier today. Patient had a similar episode 2 days ago.  EXAM: CT HEAD WITHOUT CONTRAST  TECHNIQUE: Contiguous axial images were obtained from the base of the skull through the vertex without intravenous contrast.  COMPARISON:  None.  FINDINGS: Ventricular system normal in size and appearance  for age. No mass lesion. No midline shift. No acute hemorrhage or hematoma. No extra-axial fluid collections. No evidence of acute infarction. No focal brain parenchymal abnormality.  No skull fracture or other focal osseous abnormality involving the skull. Visualized paranasal sinuses, bilateral mastoid air cells and bilateral middle ear cavities well-aerated. Minimal bilateral carotid siphon atherosclerosis.  IMPRESSION: Normal intracranially.   Electronically Signed   By: Evangeline Dakin M.D.   On: 02/27/2015 11:15     CBC  Recent Labs Lab 03/11/15 1710  03/12/15 1023 03/13/15 0416 03/13/15 1914 03/14/15 0406 03/15/15 0445 03/16/15 0528  WBC 5.2  < > 4.8 7.7 6.4 4.9 4.2 3.9*  HGB 4.6*  < > 5.9* 6.8* 8.4* 7.7* 7.7* 7.7*  HCT 15.1*  < > 18.4* 20.6* 25.4* 23.5* 23.8* 24.1*  PLT 204  < > 219 224 192 193 180 177  MCV 77.4*  < > 77.3* 80.2 79.6  80.2 80.4 82.5  MCH 23.6*  < > 24.8* 26.5 26.3 26.3 26.0 26.4  MCHC 30.5  < > 32.1 33.0 33.1 32.8 32.4 32.0  RDW 15.6*  < > 15.3 15.7* 15.3 15.6* 16.6* 17.1*  LYMPHSABS 0.4*  --  0.3*  --   --   --   --   --   MONOABS 0.4  --  0.3  --   --   --   --   --   EOSABS 0.0  --  0.0  --   --   --   --   --   BASOSABS 0.0  --  0.0  --   --   --   --   --   < > = values in this interval not displayed.  Chemistries   Recent Labs Lab 03/11/15 1710  03/13/15 0416 03/14/15 0406 03/15/15 0445 03/15/15 1200 03/16/15 0528  NA 135  < > 137 137 137 137 139  K 4.1  < > 4.1 3.7 3.4* 3.7 3.8  CL 105  < > 107 106 107 107 109  CO2 21*  < > 20* 24 23 21* 21*  GLUCOSE 130*  < > 142* 126* 141* 120* 124*  BUN 21*  < > 28* 25* 22* 21* 17  CREATININE 1.81*  < > 2.10* 1.83* 1.79* 1.67* 1.63*  CALCIUM 8.5*  < > 8.0* 7.8* 7.8* 8.0* 7.8*  AST 18  --   --   --   --  18 17  ALT 15*  --   --   --   --  15* 16*  ALKPHOS 41  --   --   --   --  45 45  BILITOT 0.5  --   --   --   --  0.9 0.8  < > = values in this interval not displayed. ------------------------------------------------------------------------------------------------------------------ estimated creatinine clearance is 70.7 mL/min (by C-G formula based on Cr of 1.63). ------------------------------------------------------------------------------------------------------------------ No results for input(s): HGBA1C in the last 72 hours. ------------------------------------------------------------------------------------------------------------------ No results for input(s): CHOL, HDL, LDLCALC, TRIG, CHOLHDL, LDLDIRECT in the last 72 hours. ------------------------------------------------------------------------------------------------------------------ No results for input(s): TSH, T4TOTAL, T3FREE, THYROIDAB in the last 72 hours.  Invalid input(s):  FREET3 ------------------------------------------------------------------------------------------------------------------ No results for input(s): VITAMINB12, FOLATE, FERRITIN, TIBC, IRON, RETICCTPCT in the last 72 hours.  Coagulation profile  Recent Labs Lab 03/11/15 1710 03/12/15 0421 03/14/15 1154  INR 1.92* 1.82* 1.29    No results for input(s): DDIMER in the last 72 hours.  Cardiac Enzymes  Recent Labs Lab 03/11/15 1710 03/12/15  0434  TROPONINI 0.14* 0.15*   ------------------------------------------------------------------------------------------------------------------ Invalid input(s): POCBNP   CBG: No results for input(s): GLUCAP in the last 168 hours.     Studies: No results found.    Lab Results  Component Value Date   HGBA1C * 02/16/2010    5.8 (NOTE)                                                                       According to the ADA Clinical Practice Recommendations for 2011, when HbA1c is used as a screening test:   >=6.5%   Diagnostic of Diabetes Mellitus           (if abnormal result  is confirmed)  5.7-6.4%   Increased risk of developing Diabetes Mellitus  References:Diagnosis and Classification of Diabetes Mellitus,Diabetes BSJG,2836,62(HUTML 1):S62-S69 and Standards of Medical Care in         Diabetes - 2011,Diabetes Care,2011,34  (Suppl 1):S11-S61.   Lab Results  Component Value Date   LDLCALC 55 02/28/2015   CREATININE 1.63* 03/16/2015       Scheduled Meds: . sodium chloride   Intravenous Once  . sodium chloride   Intravenous Once  . atorvastatin  80 mg Oral q1800  . cholecalciferol  1,000 Units Oral Daily  . clopidogrel  75 mg Oral Daily  . doxazosin  2 mg Oral QHS  . ezetimibe  10 mg Oral Daily  . ferrous sulfate  325 mg Oral TID WC  . levothyroxine  88 mcg Oral QAC breakfast  . metoprolol tartrate  12.5 mg Oral BID  . sodium chloride  3 mL Intravenous Q12H   Continuous Infusions: . dextrose 5 % and 0.45% NaCl 75 mL/hr  at 03/16/15 0600    Principal Problem:   Gross hematuria Active Problems:   Hypothyroidism   Obesity, Class III, BMI 40-49.9 (morbid obesity)   RENAL DISEASE, CHRONIC, STAGE III   Acute MI inferior posterior subsequent episode care   Bleeding   CAD in native artery    Time spent: 45 minutes   [facility] Pager [phone]. If 7PM-7AM, please contact night-coverage at [email or web address], password [facility] 03/16/2015, 10:38 AM  LOS: 4 days

## 2015-03-17 ENCOUNTER — Ambulatory Visit
Admit: 2015-03-17 | Discharge: 2015-03-17 | Disposition: A | Discharge status: Home / Self Care | Payer: Medicare Other | Attending: Radiation Oncology | Admitting: Radiation Oncology

## 2015-03-17 ENCOUNTER — Ambulatory Visit: Payer: Medicare Other | Admitting: Radiation Oncology

## 2015-03-17 DIAGNOSIS — Z510 Encounter for antineoplastic radiation therapy: Secondary | ICD-10-CM | POA: Diagnosis not present

## 2015-03-17 DIAGNOSIS — D62 Acute posthemorrhagic anemia: Secondary | ICD-10-CM | POA: Diagnosis present

## 2015-03-17 DIAGNOSIS — I2782 Chronic pulmonary embolism: Secondary | ICD-10-CM | POA: Diagnosis present

## 2015-03-17 LAB — TYPE AND SCREEN
ABO/RH(D): A POS
ANTIBODY SCREEN: NEGATIVE
Unit division: 0
Unit division: 0

## 2015-03-17 LAB — CBC
HCT: 23.1 % — ABNORMAL LOW (ref 39.0–52.0)
Hemoglobin: 7.1 g/dL — ABNORMAL LOW (ref 13.0–17.0)
MCH: 25.5 pg — ABNORMAL LOW (ref 26.0–34.0)
MCHC: 30.7 g/dL (ref 30.0–36.0)
MCV: 83.1 fL (ref 78.0–100.0)
Platelets: 181 10*3/uL (ref 150–400)
RBC: 2.78 MIL/uL — ABNORMAL LOW (ref 4.22–5.81)
RDW: 17 % — ABNORMAL HIGH (ref 11.5–15.5)
WBC: 4 10*3/uL (ref 4.0–10.5)

## 2015-03-17 LAB — COMPREHENSIVE METABOLIC PANEL
ALBUMIN: 2.7 g/dL — AB (ref 3.5–5.0)
ALT: 19 U/L (ref 17–63)
AST: 18 U/L (ref 15–41)
Alkaline Phosphatase: 48 U/L (ref 38–126)
Anion gap: 6 (ref 5–15)
BILIRUBIN TOTAL: 0.6 mg/dL (ref 0.3–1.2)
BUN: 17 mg/dL (ref 6–20)
CHLORIDE: 111 mmol/L (ref 101–111)
CO2: 22 mmol/L (ref 22–32)
Calcium: 8 mg/dL — ABNORMAL LOW (ref 8.9–10.3)
Creatinine, Ser: 1.5 mg/dL — ABNORMAL HIGH (ref 0.61–1.24)
GFR calc Af Amer: 55 mL/min — ABNORMAL LOW (ref >60)
GFR calc non Af Amer: 47 mL/min — ABNORMAL LOW (ref >60)
Glucose, Bld: 126 mg/dL — ABNORMAL HIGH (ref 65–99)
Potassium: 3.8 mmol/L (ref 3.5–5.1)
SODIUM: 139 mmol/L (ref 135–145)
TOTAL PROTEIN: 5 g/dL — AB (ref 6.5–8.1)

## 2015-03-17 LAB — PREPARE RBC (CROSSMATCH)

## 2015-03-17 MED ORDER — SODIUM CHLORIDE 0.9 % IV SOLN
Freq: Once | INTRAVENOUS | Status: AC
  Administered 2015-03-17: 18:00:00 via INTRAVENOUS

## 2015-03-17 MED ORDER — CAPECITABINE 500 MG PO TABS
2000.0000 mg | ORAL_TABLET | Freq: Two times a day (BID) | ORAL | Status: DC
  Administered 2015-03-17 – 2015-03-19 (×4): 2000 mg via ORAL
  Filled 2015-03-17 (×6): qty 4

## 2015-03-17 NOTE — Progress Notes (Signed)
Subjective: No chest pain, no SOB  Objective: Vital signs in last 24 hours: Temp:  [98.1 F (36.7 C)-98.5 F (36.9 C)] 98.1 F (36.7 C) (08/11 0431) Pulse Rate:  [94-101] 101 (08/11 0431) Resp:  [18-19] 18 (08/11 0431) BP: (107-143)/(54-67) 143/67 mmHg (08/11 0431) SpO2:  [96 %-100 %] 97 % (08/11 0431) Weight:  [351 lb 8 oz (159.439 kg)] 351 lb 8 oz (159.439 kg) (08/10 1550) Weight change:  Last BM Date: 03/16/15 Intake/Output from previous day: +2180 08/10 0701 - 08/11 0700 In: 2300 [P.O.:500; I.V.:1800] Out: 120 [Urine:120] Intake/Output this shift: Total I/O In: -  Out: 150 [Urine:150]  PE: General:Pleasant affect, NAD Skin:Warm and dry, brisk capillary refill HEENT:normocephalic, sclera clear, mucus membranes moist Heart:S1S2 RRR without murmur, gallup, rub or click Lungs:clear, ant  without rales, rhonchi, or wheezes ZOX:WRUEA, soft, non tender, + BS, do not palpate liver spleen or masses Ext:no lower ext edema, 2+ pedal pulses, 2+ radial pulses Neuro:alert and oriented X 3, MAE, follows commands, + facial symmetry Tele:  SR to ST   Lab Results:  Recent Labs  03/16/15 0528 03/17/15 0431  WBC 3.9* 4.0  HGB 7.7* 7.1*  HCT 24.1* 23.1*  PLT 177 181   BMET  Recent Labs  03/16/15 0528 03/17/15 0431  NA 139 139  K 3.8 3.8  CL 109 111  CO2 21* 22  GLUCOSE 124* 126*  BUN 17 17  CREATININE 1.63* 1.50*  CALCIUM 7.8* 8.0*   No results for input(s): TROPONINI in the last 72 hours.  Invalid input(s): CK, MB  Lab Results  Component Value Date   CHOL 107 02/28/2015   HDL 29* 02/28/2015   LDLCALC 55 02/28/2015   TRIG 114 02/28/2015   CHOLHDL 3.7 02/28/2015      Lab Results  Component Value Date   TSH 2.528 03/11/2015    Hepatic Function Panel  Recent Labs  03/17/15 0431  PROT 5.0*  ALBUMIN 2.7*  AST 18  ALT 19  ALKPHOS 48  BILITOT 0.6   No results for input(s): CHOL in the last 72 hours. No results for input(s): PROTIME in  the last 72 hours.     Studies/Results:   Echocardiogram 03/01/2015  - Left ventricle: The cavity size was normal. Wall thickness was normal. Systolic function was normal. The estimated ejection fraction was in the range of 50% to 55%. Features are consistent with a pseudonormal left ventricular filling pattern, with concomitant abnormal relaxation and increased filling pressure (grade 2 diastolic dysfunction). - Left atrium: The atrium was mildly dilated.  Impressions:  - The echo is extremely poor quality. Accurate assessment of the valves cannot be made by this echo. The LV function can be seen to be fairly normal using definity contrast.        Medications: I have reviewed the patient's current medications. Scheduled Meds: . sodium chloride   Intravenous Once  . sodium chloride   Intravenous Once  . sodium chloride   Intravenous Once  . atorvastatin  80 mg Oral q1800  . capecitabine  2,000 mg Oral BID PC  . cholecalciferol  1,000 Units Oral Daily  . clopidogrel  75 mg Oral Daily  . doxazosin  2 mg Oral QHS  . ezetimibe  10 mg Oral Daily  . ferrous sulfate  325 mg Oral TID WC  . levothyroxine  88 mcg Oral QAC breakfast  . metoprolol tartrate  12.5 mg Oral BID  . sodium chloride  3 mL Intravenous Q12H   Continuous Infusions: . dextrose 5 % and 0.45% NaCl 75 mL/hr at 03/17/15 0510   PRN Meds:.acetaminophen, HYDROcodone-acetaminophen  Assessment/Plan: Principal Problem:   Gross hematuria Active Problems:   Hypothyroidism   Obesity, Class III, BMI 40-49.9 (morbid obesity)   RENAL DISEASE, CHRONIC, STAGE III   Acute MI inferior posterior subsequent episode care   Bleeding   CAD in native artery  1. Severe acute blood loss anemia due to bleeding from urinary tract - hgb 4.6 on arrival 03/11/2015. Hgb continue to trend down after transfusion, received additional PRBC---now H/H 7.1/23 and to be transfused 2 u PRBCs  -  urology consulted, felt bleeding likely from triple therapy with ASA, plavix and coumadin and from edge of previous TUR. Dr. Tammi Klippel reviewing the case, likely transfer to [facility] for radiotherapy - ASA and coumadin on hold, continue on plavix given recent MI. Encouraged ambulation given no DVT prophylaxis and h/o DVT/PE, SCD at night.  - stable from cardiology perspective to transfer to [facility], once patient is no longer bleeding, maybe beneficial to restart low dose 81mg  ASA as he received his last stent on 7/24. Likely can avoid restarting coumadin in the future as long as he is ambulatory (place on SCD at night while in the hospital). Per pt, he only had 1 episode of both DVT and PE in 2010  For radiation today.   2. Recent STEMI 02/27/15 with DES to OM1, residual moderate diffuse 3V CAD, EF 50-55% on Plavix alone   3. 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 Morbid obesity Body mass index is 49.19 kg/(m^2).  5. Rectal cancer and bladder cancer - recently underwent TURBT; pathology c/w adenocarcinoma indicative of metastatic rectal CA  6. AKI on CKD stage III (prior Cr 1.5-1.7)  7. STach most likely from anemia, will monitor   LOS: 5 days   Time spent with pt. :15 minutes. [facility] R  Nurse Practitioner Certified Pager [phone] or after 5pm and on weekends call [phone] 03/17/2015, 9:52 AM   Agree with note written by Cecilie Kicks RNP  Pt admitted with bladder CA s/P Bx and subsequent hematuria with decreased HGB (now 7.1). He was on asa/plavix and coumadin (ASA and coumadin on hold). Has remote DVT/PE and STEMI last month with Synergy DES LCX-OM. No CP/SOB. Scheduled to get Tx 2 units PRBCs today and Rad Rx for bladder CA. Will follow with you.   Quay Burow 03/17/2015 11:18 AM

## 2015-03-17 NOTE — Addendum Note (Signed)
Encounter addended by: Tyler Pita, MD on: 03/17/2015  8:11 AM<BR>     Documentation filed: Notes Section

## 2015-03-17 NOTE — Progress Notes (Signed)
[facility] PROGRESS NOTE  Richard Warner KVQ:[id number] DOB: [birth year] DOA: 03/11/2015 PCP: Rachell Cipro, MD  Assessment/Plan: Principal Problem:   Gross hematuria Active Problems:   Hypothyroidism   Obesity, Class III, BMI 40-49.9 (morbid obesity)   RENAL DISEASE, CHRONIC, STAGE III   Acute MI inferior posterior subsequent episode care   Bleeding   CAD in native artery   Chronic pulmonary embolism   Cardiology has requested that [facility] assume the role of primary inpatient physician.  Gross hematuria -Secondary to a combination of recent TURBT (7/20) and anti-platelet plus anticoagulation therapy. Baseline Hgb 15.1 on 4/21 and 9.5 on 7/24. Current Hgb status post 6 units of PRBCs is 7.0,  -2 units of PRBC's have been ordered today -S/P DES placement on 7/24, will need to continue Plavix -Aspirin and Coumadin have been discontinued. Once the patient is no longer bleeding can restart aspirin 81 mg a day   - Tyler Pita, MD, consulted patient to undergo radiation therapy today  Acute blood loss anemia -Secondary to urinary source -Had Hgb 4.6 on arrival 03/11/2015. Hgb continue to trend down after transfusion, receiving additional PRBC.urology consulted, felt bleeding likely from triple therapy with ASA, plavix and coumadin and from edge of previous TUR.  -Stopped ASA and coumadin, continue on plavix given recent MI.  -Will transfuse 2 units of PRBC's today  STEMI on 7/24 -Cardiology managing. Patient on Plavix, atorvastatin, Zetia, metoprolol.  H/o DVT/PE, on Coumadin prior to admission  Metastatic rectal cancer -Status post hemicolectomy in 2013. -Most recent prostate biopsy on 7/20 shows metastatic adenocarcinoma. -Med/Onc consulted  Hypothyroidism -On levothyroxine  Chronic kidney disease AM labs showing creatinine of 1.5 which is baseline  Morbid obesity Body mass index is 49.05 kg/(m^2).    Code Status:      Code Status  Orders        Start     Ordered   03/11/15 1618  Full code   Continuous     03/11/15 1617     Family Communication: Disposition Plan:  Plan for radiation therapy today   Brief narrative: 64 year old male with hypertension, chronic kidney disease, rectal cancer, and recent STEMI, presents from cardiology office with weakness and shortness of breath on 8/5. Hemoglobin was 4.6. Per the patient report he was diagnosed with PE/DVT in 2011 and placed on Coumadin. He remained on Coumadin and in 2013 he was diagnosed with colon cancer. He underwent hemicolectomy and did well. In 2016 his CEA was elevated Dr. Benay Spice recommended a prostate biopsy. The first biopsy was negative for cancer but the patient bled for 3 weeks after having it done. Despite a negative biopsy the CEA was still elevated and a second prostate biopsy was done on 7/20 (which was positive for adenocarcinoma). On 7/24 the patient had a STEMI. A drug-eluting stent was inserted and he was placed on aspirin/Plavix/Coumadin therapy. On Thursday 7/28 the patient began to have gross hematuria. He was not in any pain or distress so he waited until his appointment on 8/5 to seek medical attention. At his cardiology appointment he was found to be pale, weak, and short of breath and was sent to the hospital for admission. Since admission he has received 6 units of packed red blood cells. He is receiving his seventh unit today. He continues to have hematuria with clots. No Foley has been placed due to concern for possible trauma. Urology is consulting and recommends tracking post void residuals to ensure he does  not become obstructed due to clots. His INR on admission was 1.92.    Consultants:  Hematology oncology, urology, cardiology  Procedures:  None  Antibiotics: Anti-infectives    None         HPI/Subjective: Denies any CP or SOB. Feeling well, still has small amount of blood clot in urine, but much better    Objective: Filed Vitals:   03/17/15 0431 03/17/15 1041 03/17/15 1155 03/17/15 1228  BP: 143/67 143/67 125/69 141/73  Pulse: 101 86 78 74  Temp: 98.1 F (36.7 C) 98 F (36.7 C) 97.8 F (36.6 C) 97.7 F (36.5 C)  TempSrc: Oral Oral Oral Oral  Resp: 18 18 18 18   Height:      Weight:      SpO2: 97% 100% 100% 100%    Intake/Output Summary (Last 24 hours) at 03/17/15 1402 Last data filed at 03/17/15 1228  Gross per 24 hour  Intake 1517.5 ml  Output    270 ml  Net 1247.5 ml    Exam:  General: Pleasant, NAD. Neuro: Alert and oriented X 3. Moves all extremities spontaneously. Psych: Normal affect. HEENT: Normal Neck: Supple without bruits or JVD. Lungs: Resp regular and unlabored, CTA. Heart: RRR no s3, s4, or murmurs. Abdomen: Soft, non-tender, non-distended, BS + x 4.  Extremities: No clubbing, cyanosis or edema. DP/PT/Radials 2+ and equal bilaterally.    Data Review   Micro Results Recent Results (from the past 240 hour(s))  Urine culture     Status: None   Collection Time: 03/11/15  5:44 PM  Result Value Ref Range Status   Specimen Description URINE, CLEAN CATCH  Final   Special Requests NONE  Final   Culture MULTIPLE SPECIES PRESENT, SUGGEST RECOLLECTION  Final   Report Status 03/13/2015 FINAL  Final    Radiology Reports Dg Chest 2 View  02/27/2015   CLINICAL DATA:  Chest pain and shortness of breath. History of rectal carcinoma.  EXAM: CHEST - 2 VIEW  COMPARISON:  09/13/2014  FINDINGS: Stable cardiac enlargement. Lung volumes are low with bibasilar atelectasis present. There is no evidence of pulmonary edema, consolidation, pneumothorax, nodule or pleural fluid. Stable osteopenia and spondylosis of the thoracic spine.  IMPRESSION: Low volumes with bibasilar atelectasis.  No active disease.   Electronically Signed   By: Aletta Edouard M.D.   On: 02/27/2015 11:28   Ct Head Wo Contrast  02/27/2015   CLINICAL DATA:  Prior low anterior resection of  rectal carcinoma in May, 2013. Recent TURBT 02/23/2015 for transitional cell carcinoma. Prior history of melanoma. Patient presents with a generalized headache which began acutely earlier today. Patient had a similar episode 2 days ago.  EXAM: CT HEAD WITHOUT CONTRAST  TECHNIQUE: Contiguous axial images were obtained from the base of the skull through the vertex without intravenous contrast.  COMPARISON:  None.  FINDINGS: Ventricular system normal in size and appearance for age. No mass lesion. No midline shift. No acute hemorrhage or hematoma. No extra-axial fluid collections. No evidence of acute infarction. No focal brain parenchymal abnormality.  No skull fracture or other focal osseous abnormality involving the skull. Visualized paranasal sinuses, bilateral mastoid air cells and bilateral middle ear cavities well-aerated. Minimal bilateral carotid siphon atherosclerosis.  IMPRESSION: Normal intracranially.   Electronically Signed   By: Evangeline Dakin M.D.   On: 02/27/2015 11:15     CBC  Recent Labs Lab 03/11/15 1710  03/12/15 1023  03/13/15 1914 03/14/15 0406 03/15/15 0445 03/16/15 0528 03/17/15 0431  WBC 5.2  < > 4.8  < > 6.4 4.9 4.2 3.9* 4.0  HGB 4.6*  < > 5.9*  < > 8.4* 7.7* 7.7* 7.7* 7.1*  HCT 15.1*  < > 18.4*  < > 25.4* 23.5* 23.8* 24.1* 23.1*  PLT 204  < > 219  < > 192 193 180 177 181  MCV 77.4*  < > 77.3*  < > 79.6 80.2 80.4 82.5 83.1  MCH 23.6*  < > 24.8*  < > 26.3 26.3 26.0 26.4 25.5*  MCHC 30.5  < > 32.1  < > 33.1 32.8 32.4 32.0 30.7  RDW 15.6*  < > 15.3  < > 15.3 15.6* 16.6* 17.1* 17.0*  LYMPHSABS 0.4*  --  0.3*  --   --   --   --   --   --   MONOABS 0.4  --  0.3  --   --   --   --   --   --   EOSABS 0.0  --  0.0  --   --   --   --   --   --   BASOSABS 0.0  --  0.0  --   --   --   --   --   --   < > = values in this interval not displayed.  Chemistries   Recent Labs Lab 03/11/15 1710  03/14/15 0406 03/15/15 0445 03/15/15 1200 03/16/15 0528 03/17/15 0431  NA 135   < > 137 137 137 139 139  K 4.1  < > 3.7 3.4* 3.7 3.8 3.8  CL 105  < > 106 107 107 109 111  CO2 21*  < > 24 23 21* 21* 22  GLUCOSE 130*  < > 126* 141* 120* 124* 126*  BUN 21*  < > 25* 22* 21* 17 17  CREATININE 1.81*  < > 1.83* 1.79* 1.67* 1.63* 1.50*  CALCIUM 8.5*  < > 7.8* 7.8* 8.0* 7.8* 8.0*  AST 18  --   --   --  18 17 18   ALT 15*  --   --   --  15* 16* 19  ALKPHOS 41  --   --   --  45 45 48  BILITOT 0.5  --   --   --  0.9 0.8 0.6  < > = values in this interval not displayed. ------------------------------------------------------------------------------------------------------------------ estimated creatinine clearance is 76.6 mL/min (by C-G formula based on Cr of 1.5). ------------------------------------------------------------------------------------------------------------------ No results for input(s): HGBA1C in the last 72 hours. ------------------------------------------------------------------------------------------------------------------ No results for input(s): CHOL, HDL, LDLCALC, TRIG, CHOLHDL, LDLDIRECT in the last 72 hours. ------------------------------------------------------------------------------------------------------------------ No results for input(s): TSH, T4TOTAL, T3FREE, THYROIDAB in the last 72 hours.  Invalid input(s): FREET3 ------------------------------------------------------------------------------------------------------------------ No results for input(s): VITAMINB12, FOLATE, FERRITIN, TIBC, IRON, RETICCTPCT in the last 72 hours.  Coagulation profile  Recent Labs Lab 03/11/15 1710 03/12/15 0421 03/14/15 1154  INR 1.92* 1.82* 1.29    No results for input(s): DDIMER in the last 72 hours.  Cardiac Enzymes  Recent Labs Lab 03/11/15 1710 03/12/15 0434  TROPONINI 0.14* 0.15*   ------------------------------------------------------------------------------------------------------------------ Invalid input(s): POCBNP   CBG: No results  for input(s): GLUCAP in the last 168 hours.     Studies: No results found.    Lab Results  Component Value Date   HGBA1C * 02/16/2010    5.8 (NOTE)  According to the ADA Clinical Practice Recommendations for 2011, when HbA1c is used as a screening test:   >=6.5%   Diagnostic of Diabetes Mellitus           (if abnormal result  is confirmed)  5.7-6.4%   Increased risk of developing Diabetes Mellitus  References:Diagnosis and Classification of Diabetes Mellitus,Diabetes XBLT,9030,09(QZRAQ 1):S62-S69 and Standards of Medical Care in         Diabetes - 2011,Diabetes TMAU,6333,54  (Suppl 1):S11-S61.   Lab Results  Component Value Date   LDLCALC 55 02/28/2015   CREATININE 1.50* 03/17/2015       Scheduled Meds: . sodium chloride   Intravenous Once  . sodium chloride   Intravenous Once  . sodium chloride   Intravenous Once  . atorvastatin  80 mg Oral q1800  . capecitabine  2,000 mg Oral BID PC  . cholecalciferol  1,000 Units Oral Daily  . clopidogrel  75 mg Oral Daily  . doxazosin  2 mg Oral QHS  . ezetimibe  10 mg Oral Daily  . ferrous sulfate  325 mg Oral TID WC  . levothyroxine  88 mcg Oral QAC breakfast  . metoprolol tartrate  12.5 mg Oral BID  . sodium chloride  3 mL Intravenous Q12H   Continuous Infusions: . dextrose 5 % and 0.45% NaCl 75 mL/hr at 03/17/15 0510    Principal Problem:   Gross hematuria Active Problems:   Hypothyroidism   Obesity, Class III, BMI 40-49.9 (morbid obesity)   RENAL DISEASE, CHRONIC, STAGE III   Acute MI inferior posterior subsequent episode care   Bleeding   CAD in native artery   Chronic pulmonary embolism    Time spent: 35 minutes   Kelvin Cellar  [facility] Pager [phone]. If 7PM-7AM, please contact night-coverage at [email or web address], password [facility] 03/17/2015, 2:02 PM  LOS: 5 days

## 2015-03-17 NOTE — Care Management Important Message (Signed)
Important Message  Patient Details  Name: Richard Warner MRN: [id number] Date of Birth: [birth year]   Medicare Important Message Given:  Yes-third notification given    Camillo Flaming 03/17/2015, 1:03 [place] Message  Patient Details  Name: Richard Warner MRN: [id number] Date of Birth: [birth year]   Medicare Important Message Given:  Yes-third notification given    Camillo Flaming 03/17/2015, 1:03 PM

## 2015-03-17 NOTE — Progress Notes (Signed)
IP PROGRESS NOTE  Subjective:   Richard Warner continues to have gross hematuria with clots. No other complaint.     Objective: Vital signs in last 24 hours: Blood pressure 143/67, pulse 101, temperature 98.1 F (36.7 C), temperature source Oral, resp. rate 18, height 5\' 11"  (1.803 m), weight 351 lb 8 oz (159.439 kg), SpO2 97 %.  Intake/Output from previous day: 08/10 0701 - 08/11 0700 In: 2300 [P.O.:500; I.V.:1800] Out: 120 [Urine:120]  Physical Exam:  Cardiac-regular rate and rhythm Lungs-clear anteriorly Vascular-stasis change at the lower leg bilaterally     Lab Results:  Recent Labs  03/16/15 0528 03/17/15 0431  WBC 3.9* 4.0  HGB 7.7* 7.1*  HCT 24.1* 23.1*  PLT 177 181   PT/INR 1.29 on 03/14/2015 BMET  Recent Labs  03/16/15 0528 03/17/15 0431  NA 139 139  K 3.8 3.8  CL 109 111  CO2 21* 22  GLUCOSE 124* 126*  BUN 17 17  CREATININE 1.63* 1.50*  CALCIUM 7.8* 8.0*    Medications: I have reviewed the patient's current medications.  Assessment/Plan:  1. Rectal cancer, stage II (pT3 pN0) status post low anterior resection 12/14/2011. The tumor was noted to be at 10 cm on a rigid proctoscopy at the time of surgery. He completed radiation and Xeloda from 01/31/2012-03/11/2012. He began a first cycle of adjuvant Xeloda on 04/22/2012. He completed cycle 5 beginning 07/17/2012. He completed cycle 6 beginning 08/08/2012.  Surveillance colonoscopy 05/15/2013, status post removal of tubular adenomas. Next colonoscopy 3 years.  Elevated CEA 06/18/2014  PET scan 06/30/2014 with mild focal hypermetabolism at the anorectal junction favored to be physiologic in the absence of associated CT abnormality; irregular bladder wall thickening.  Colonoscopy 07/27/2014 with removal of a sessile ascending colon polyp-tubular adenoma, no other significant findings  PET scan 12/27/2014: status post low anterior resection. No evidence of hypermetabolic recurrent or metastatic  disease. Similar hypermetabolism about the anus.  Lower endoscopic ultrasound 01/06/2015 with findings of nonspecific 4-5 mm thickening of the rectal wall just distal to the LAR anastomosis. Fine-needle aspiration was negative for malignant cells. 2. History of iron deficiency anemia likely secondary to rectal bleeding.  3. 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 He is maintained on Coumadin.  4. Renal insufficiency.  5. Hypertension. 6. Chronic mild thrombocytopenia 7. Hypothyroid. 8. Microscopic hematuria on a urinalysis 07/02/2014  Evaluated by Dr. Janice Norrie including a cystoscopic biopsy of an erythematous/edematous area of the bladder on 12/03/2014-negative for malignancy  Repeat cystoscopy confirmed a 3 cm anterior bladder mass, status post cystoscopic resection 02/23/2015 with the pathology confirming metastatic colorectal adenocarcinoma 9. Right anterior chest wall skin lesion/mass status post excision 09/20/2014. 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 Margins not involved. No evidence of angiolymphatic invasion or perineural invasion. 10. Gross hematuria, intermittent since bladder biopsy 12/03/2014. Now with severe hematuria 11. Admission 02/27/2015 with an acute myocardial infarction, status post a PCI and DES of first marginal lesion 12. Admission 03/11/2015 with severe symptomatic anemia secondary to gross hematuria  Richard Warner continues to have gross hematuria. The hemoglobin is lower today. The plan is to begin palliative radiation and concurrent capecitabine. I reviewed the potential toxicities associated with capecitabine and Richard Warner agrees to proceed. We will administer capecitabine on the days of radiation only. Dr. Janice Norrie has discussed the case with the urology service at [facility]. They will see him in consultation over the next few weeks to consider the indication for a bladder resection  procedure.   Recommendations:  1. Proceed with palliative radiation and capecitabine 2. Continue ferrous sulfate 3. Transfuse packed red blood cells today    LOS: 5 days   Eisha Chatterjee  03/17/2015, 9:45 AM

## 2015-03-18 ENCOUNTER — Telehealth: Payer: Self-pay

## 2015-03-18 ENCOUNTER — Other Ambulatory Visit: Payer: Self-pay | Admitting: *Deleted

## 2015-03-18 ENCOUNTER — Telehealth: Payer: Self-pay | Admitting: Cardiovascular Disease

## 2015-03-18 ENCOUNTER — Encounter: Payer: Self-pay | Admitting: Radiation Oncology

## 2015-03-18 ENCOUNTER — Ambulatory Visit
Admit: 2015-03-18 | Discharge: 2015-03-18 | Disposition: A | Discharge status: Home / Self Care | Payer: Medicare Other | Attending: Radiation Oncology | Admitting: Radiation Oncology

## 2015-03-18 ENCOUNTER — Encounter: Payer: Self-pay | Admitting: Oncology

## 2015-03-18 ENCOUNTER — Telehealth: Payer: Self-pay | Admitting: Oncology

## 2015-03-18 DIAGNOSIS — Z510 Encounter for antineoplastic radiation therapy: Secondary | ICD-10-CM | POA: Diagnosis not present

## 2015-03-18 DIAGNOSIS — Z862 Personal history of diseases of the blood and blood-forming organs and certain disorders involving the immune mechanism: Secondary | ICD-10-CM

## 2015-03-18 DIAGNOSIS — C7911 Secondary malignant neoplasm of bladder: Secondary | ICD-10-CM

## 2015-03-18 DIAGNOSIS — C20 Malignant neoplasm of rectum: Secondary | ICD-10-CM

## 2015-03-18 DIAGNOSIS — D62 Acute posthemorrhagic anemia: Principal | ICD-10-CM

## 2015-03-18 LAB — TYPE AND SCREEN
ABO/RH(D): A POS
ANTIBODY SCREEN: NEGATIVE
Unit division: 0
Unit division: 0

## 2015-03-18 LAB — CBC
HEMATOCRIT: 28 % — AB (ref 39.0–52.0)
HEMOGLOBIN: 8.5 g/dL — AB (ref 13.0–17.0)
MCH: 25.8 pg — ABNORMAL LOW (ref 26.0–34.0)
MCHC: 30.4 g/dL (ref 30.0–36.0)
MCV: 85.1 fL (ref 78.0–100.0)
Platelets: 165 10*3/uL (ref 150–400)
RBC: 3.29 MIL/uL — AB (ref 4.22–5.81)
RDW: 16.8 % — ABNORMAL HIGH (ref 11.5–15.5)
WBC: 4.2 10*3/uL (ref 4.0–10.5)

## 2015-03-18 LAB — BASIC METABOLIC PANEL
ANION GAP: 7 (ref 5–15)
BUN: 15 mg/dL (ref 6–20)
CALCIUM: 8.1 mg/dL — AB (ref 8.9–10.3)
CO2: 22 mmol/L (ref 22–32)
Chloride: 111 mmol/L (ref 101–111)
Creatinine, Ser: 1.53 mg/dL — ABNORMAL HIGH (ref 0.61–1.24)
GFR calc non Af Amer: 46 mL/min — ABNORMAL LOW (ref >60)
GFR, EST AFRICAN AMERICAN: 54 mL/min — AB (ref >60)
Glucose, Bld: 130 mg/dL — ABNORMAL HIGH (ref 65–99)
Potassium: 4.1 mmol/L (ref 3.5–5.1)
SODIUM: 140 mmol/L (ref 135–145)

## 2015-03-18 LAB — MAGNESIUM: Magnesium: 2.1 mg/dL (ref 1.7–2.4)

## 2015-03-18 LAB — HEMOGLOBIN AND HEMATOCRIT, BLOOD
HCT: 29 % — ABNORMAL LOW (ref 39.0–52.0)
Hemoglobin: 9.3 g/dL — ABNORMAL LOW (ref 13.0–17.0)

## 2015-03-18 LAB — POTASSIUM: POTASSIUM: 3.9 mmol/L (ref 3.5–5.1)

## 2015-03-18 MED ORDER — CAPECITABINE 500 MG PO TABS: 2000.0000 mg | ORAL_TABLET | Freq: Two times a day (BID) | ORAL | Status: DC

## 2015-03-18 MED ORDER — DOCUSATE SODIUM 100 MG PO CAPS
100.0000 mg | ORAL_CAPSULE | Freq: Two times a day (BID) | ORAL | Status: DC
  Administered 2015-03-18 – 2015-03-19 (×2): 100 mg via ORAL
  Filled 2015-03-18 (×2): qty 1

## 2015-03-18 NOTE — Telephone Encounter (Signed)
Richard Warner needed a 2 month fu -[name]  TAVR appt on 10-13 AT 1030a-You weren't here to ask-so please let me know if I need to move him

## 2015-03-18 NOTE — Progress Notes (Signed)
I faxed biologics req for xeloda [phone]

## 2015-03-18 NOTE — Progress Notes (Signed)
Patient still has hematuria and passing blood clots. PCP was notified.

## 2015-03-18 NOTE — Progress Notes (Signed)
  Radiation Oncology         [phone]     Name: Richard Warner MRN: [id number]   Date: 03/18/2015  DOB: [birth year]   Inpatient  Weekly Radiation Therapy Management    ICD-9-CM ICD-10-CM   1. 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 The patient is without complaint. Set-up films were reviewed. The chart was checked.  Physical Findings  No significant changes.  Impression The patient is tolerating radiation.  Plan Continue treatment as planned.    This document serves as a record of services personally performed by Tyler Pita, MD. It was created on his behalf by Arlyce Harman, a trained medical scribe. The creation of this record is based on the scribe's personal observations and the provider's statements to them. This document has been checked and approved by the attending provider.       Sheral Apley Tammi Klippel, M.D.

## 2015-03-18 NOTE — Progress Notes (Signed)
Patient had 16 beats of V-tach. Vitals: 98.4F;HR 97;RR 18; 135/63;SATS 98% on Room air. PCP on call was notified. Patient was asymptomatic.

## 2015-03-18 NOTE — Progress Notes (Signed)
IP PROGRESS NOTE  Subjective:   Mr. Condrey continues to have hematuria. The hematuria has improved compared to on hospital admission. No other complaint.    Objective: Vital signs in last 24 hours: Blood pressure 128/57, pulse 94, temperature 98.2 F (36.8 C), temperature source Oral, resp. rate 20, height 5\' 11"  (1.803 m), weight 351 lb 8 oz (159.439 kg), SpO2 97 %.  Intake/Output from previous day: 08/11 0701 - 08/12 0700 In: 2815 [P.O.:720; I.V.:800; Blood:695] Out: 650 [Urine:650]  Physical Exam: Not performed today      Lab Results:  Recent Labs  03/17/15 0431 03/18/15 0350  WBC 4.0 4.2  HGB 7.1* 8.5*  HCT 23.1* 28.0*  PLT 181 165   PT/INR 1.29 on 03/14/2015 BMET  Recent Labs  03/17/15 0431 03/18/15 0350  NA 139 140  K 3.8 4.1  CL 111 111  CO2 22 22  GLUCOSE 126* 130*  BUN 17 15  CREATININE 1.50* 1.53*  CALCIUM 8.0* 8.1*    Medications: I have reviewed the patient's current medications.  Assessment/Plan:  1. Rectal cancer, stage II (pT3 pN0) status post low anterior resection 12/14/2011. The tumor was noted to be at 10 cm on a rigid proctoscopy at the time of surgery. He completed radiation and Xeloda from 01/31/2012-03/11/2012. He began a first cycle of adjuvant Xeloda on 04/22/2012. He completed cycle 5 beginning 07/17/2012. He completed cycle 6 beginning 08/08/2012.  Surveillance colonoscopy 05/15/2013, status post removal of tubular adenomas. Next colonoscopy 3 years.  Elevated CEA 06/18/2014  PET scan 06/30/2014 with mild focal hypermetabolism at the anorectal junction favored to be physiologic in the absence of associated CT abnormality; irregular bladder wall thickening.  Colonoscopy 07/27/2014 with removal of a sessile ascending colon polyp-tubular adenoma, no other significant findings  PET scan 12/27/2014: status post low anterior resection. No evidence of hypermetabolic recurrent or metastatic disease. Similar hypermetabolism about  the anus.  Lower endoscopic ultrasound 01/06/2015 with findings of nonspecific 4-5 mm thickening of the rectal wall just distal to the LAR anastomosis. Fine-needle aspiration was negative for malignant cells. 2. History of iron deficiency anemia likely secondary to rectal bleeding.  3. 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 He is maintained on Coumadin.  4. Renal insufficiency.  5. Hypertension. 6. Chronic mild thrombocytopenia 7. Hypothyroid. 8. Microscopic hematuria on a urinalysis 07/02/2014  Evaluated by Dr. Janice Norrie including a cystoscopic biopsy of an erythematous/edematous area of the bladder on 12/03/2014-negative for malignancy  Repeat cystoscopy confirmed a 3 cm anterior bladder mass, status post cystoscopic resection 02/23/2015 with the pathology confirming metastatic colorectal adenocarcinoma  Initiation of palliative radiation to the bladder and concurrent capecitabine on 03/17/2015 9. Right anterior chest wall skin lesion/mass status post excision 09/20/2014. 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 Margins not involved. No evidence of angiolymphatic invasion or perineural invasion. 10. Gross hematuria, intermittent since bladder biopsy 12/03/2014. Now with severe hematuria 11. Admission 02/27/2015 with an acute myocardial infarction, status post a PCI and DES of first marginal lesion 12. Admission 03/11/2015 with severe symptomatic anemia secondary to gross hematuria  Mr. Bevens appears stable. The plan is to continue palliative radiation and capecitabine. He can be discharged to complete outpatient treatment. I discussed the case with Dr. Janice Norrie this morning. He is arranging for a urology appointment at [facility] for within the next few weeks. I will prescribe the outpatient capecitabine.   Recommendations: 1. Continue palliative radiation and capecitabine 2. Continue ferrous sulfate 3. Check CBC [facility] [place]  03/21/2015,  office visit at the [facility] 03/24/2015  Please call oncology as needed.    LOS: 6 days   Malicia Blasdel  03/18/2015, 8:26 AM

## 2015-03-18 NOTE — Progress Notes (Signed)
Subjective: No compaints, passed one clot in urine earlier today  Objective: Vital signs in last 24 hours: Temp:  [97.6 F (36.4 C)-98.2 F (36.8 C)] 98.2 F (36.8 C) (08/12 0554) Pulse Rate:  [74-102] 102 (08/12 0929) Resp:  [18-20] 20 (08/12 0554) BP: (125-153)/(57-73) 130/60 mmHg (08/12 0929) SpO2:  [97 %-100 %] 97 % (08/12 0554) Weight change:  Last BM Date: 03/17/15 Intake/Output from previous day: 08/11 0701 - 08/12 0700 In: 2815 [P.O.:720; I.V.:800; Blood:695] Out: 650 [Urine:650] Intake/Output this shift: Total I/O In: 243 [P.O.:240; I.V.:3] Out: -   PE: General:Pleasant affect, NAD Skin:Warm and dry, brisk capillary refill HEENT:normocephalic, sclera clear, mucus membranes moist Heart:S1S2 RRR without murmur, gallup, rub or click Lungs:clear without rales, rhonchi, or wheezes HWT:UUEKC, soft, non tender, + BS, do not palpate liver spleen or masses MKL:KJZPH ext +edema, legs are tight,  2+ radial pulses Neuro:alert and oriented X 3, MAE, follows commands, + facial symmetry Tele:  SR occ brief burst of ST.     Lab Results:  Recent Labs  03/17/15 0431 03/18/15 0350  WBC 4.0 4.2  HGB 7.1* 8.5*  HCT 23.1* 28.0*  PLT 181 165   BMET  Recent Labs  03/17/15 0431 03/18/15 0350  NA 139 140  K 3.8 4.1  CL 111 111  CO2 22 22  GLUCOSE 126* 130*  BUN 17 15  CREATININE 1.50* 1.53*  CALCIUM 8.0* 8.1*   No results for input(s): TROPONINI in the last 72 hours.  Invalid input(s): CK, MB  Lab Results  Component Value Date   CHOL 107 02/28/2015   HDL 29* 02/28/2015   LDLCALC 55 02/28/2015   TRIG 114 02/28/2015   CHOLHDL 3.7 02/28/2015      Lab Results  Component Value Date   TSH 2.528 03/11/2015    Hepatic Function Panel  Recent Labs  03/17/15 0431  PROT 5.0*  ALBUMIN 2.7*  AST 18  ALT 19  ALKPHOS 48  BILITOT 0.6   No results for input(s): CHOL in the last 72 hours. No results for input(s): PROTIME in the last 72  hours.     Studies/Results: No results found.  Medications: I have reviewed the patient's current medications. Scheduled Meds: . sodium chloride   Intravenous Once  . sodium chloride   Intravenous Once  . atorvastatin  80 mg Oral q1800  . capecitabine  2,000 mg Oral BID PC  . cholecalciferol  1,000 Units Oral Daily  . clopidogrel  75 mg Oral Daily  . doxazosin  2 mg Oral QHS  . ezetimibe  10 mg Oral Daily  . ferrous sulfate  325 mg Oral TID WC  . levothyroxine  88 mcg Oral QAC breakfast  . metoprolol tartrate  12.5 mg Oral BID  . sodium chloride  3 mL Intravenous Q12H   Continuous Infusions: . dextrose 5 % and 0.45% NaCl 75 mL/hr (03/18/15 0802)   PRN Meds:.acetaminophen, HYDROcodone-acetaminophen  Assessment/Plan: 1. Severe acute blood loss anemia due to bleeding from urinary tract - hgb 4.6 on arrival 03/11/2015. Hgb continue to trend down after transfusion, received additional PRBC--- H/H 7.1/23  transfused 2 u PRBCs now 8.5/28    - urology consulted, felt bleeding likely from triple therapy with ASA, plavix and coumadin and from edge of previous TUR. Dr. Tammi Klippel reviewing the case,was transferred to [facility] for radiotherapy - ASA and coumadin on hold, continue on plavix given recent MI. Encouraged ambulation given no DVT prophylaxis and h/o  DVT/PE, SCD at night.  - stable from cardiology perspective to transfer to [facility], once patient is no longer bleeding, maybe beneficial to restart low dose 81mg  ASA as he received his last stent on 7/24. Likely can avoid restarting coumadin in the future as long as he is ambulatory (place on SCD at night while in the hospital). Per pt, he only had 1 episode of both DVT and PE in 2010  OK for discharge from oncology- pt has radiation today and then may be discharged this evening or in AM  He has appt in [place] on 03/28/15 ? Resume ASA after that appt?  Will have him seen in office in 2 weeks.   2.  Recent STEMI 02/27/15 with DES to OM1, residual moderate diffuse 3V CAD, EF 50-55% on Plavix alone   3. 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 Morbid obesity Body mass index is 49.19 kg/(m^2).  5. Rectal cancer and bladder cancer - recently underwent TURBT; pathology c/w adenocarcinoma indicative of metastatic rectal CA  6. AKI on CKD stage III (prior Cr 1.5-1.7) today 1.53 stable  7. STach most likely from anemia, and rate overall improved after transfusion yesterday.   LOS: 6 days   Time spent with pt. :15 minutes. [facility] R  Nurse Practitioner Certified Pager [phone] or after 5pm and on weekends call [phone] 03/18/2015, 10:28 AM   Agree with note written by Cecilie Kicks RNP  Doing well. Cardiac stable . S/P Tx 2 units PRBCs. Hgb better. Feels better. SR in 80s. On Plavix. For Rad Rx today and home tomorrow. Will arrange OP F/U.  Quay Burow 03/18/2015 12:18 PM

## 2015-03-18 NOTE — Telephone Encounter (Signed)
Lab appointments made per pof  anne

## 2015-03-18 NOTE — Progress Notes (Signed)
Progress Note   Richard Warner OMB:[id number] DOB: 20-Jul-1951 DOA: 03/11/2015 PCP: Rachell Cipro, MD   Brief Narrative:   Richard Warner is an 64 y.o. male with a PMH of hypertension, chronic kidney disease, rectal cancer, and recent STEMI, who was admitted 03/11/15 from cardiology officewith weakness and shortness of breath. Hemoglobin was 4.6. Per the patient report he was diagnosed with PE/DVT in 2011 and placed on Coumadin. He remained on Coumadin and in 2013 he was diagnosed with colon cancer. He underwent hemicolectomy and did well. In 2016 his CEA was elevated Dr. Benay Spice recommended a prostate biopsy. The first biopsy was negative for cancer but the patient bled for 3 weeks after having it done. Despite a negative biopsy the CEA was still elevated and a second prostate biopsy was done on 7/20 (which was positive for adenocarcinoma). On 7/24 the patient had a STEMI. A drug-eluting stent was inserted and he was placed on aspirin/Plavix/Coumadin therapy. On Thursday 7/28 the patient began to have gross hematuria. He was not in any pain or distress so he waited until his appointment on 8/5 to seek medical attention. At his cardiology appointment he was found to be pale, weak, and short of breath and was sent to the hospital for admission. Since admission he has received 8 units of packed red blood cells. He continues to have hematuria with clots. No Foley has been placed due to concern for possible trauma. Urology is consulting and recommends tracking post void residuals to ensure he does not become obstructed due to clots. His INR on admission was 1.92.   Assessment/Plan:   Principal Problem:   Gross hematuria / acute blood loss anemia secondary to genitourinary hemorrhage in the setting of anticoagulation and antiplatelet therapy and a bladder mass status post biopsy confirming metastatic colorectal adenocarcinoma - Secondary to a combination of recent TURBT (7/20) and  anti-platelet plus anticoagulation therapy. - Status post a total of 8 units of PRBCs, with hemoglobin of 8.5 this morning. - S/P DES placement on 7/24, will need to continue Plavix. - Aspirin and Coumadin have been discontinued. Once the patient is no longer bleeding can restart aspirin 81 mg a day.  - Being followed by oncology and radiation oncology with plan to proceed with cystoscopic intervention/radiation and sensitizing chemotherapy.  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 Status post hemicolectomy in 2013. - Management per Dr. Gearldine Shown note. On Xeloda.    Hypothyroidism - Continue levothyroxine.    Obesity, Class III, BMI 40-49.9 (morbid obesity)    RENAL DISEASE, CHRONIC, STAGE III - Current creatinine consistent with usual baseline values.    Acute MI inferior posterior subsequent episode care/CAD in native artery - Being followed by cardiology. Continue Plavix. Aspirin and Coumadin remain on hold. - Continue beta blocker, statin and Zetia.    Chronic pulmonary embolism - Coumadin on hold secondary to active ongoing bleeding.    DVT Prophylaxis - None secondary to active bleeding.  Family Communication: Sister updated at the bedside Disposition Plan: Home 03/19/15 if hemoglobin remains stable. Code Status:     Code Status Orders        Start     Ordered   03/11/15 1618  Full code   Continuous     03/11/15 1617        IV Access:    Peripheral IV   Procedures and diagnostic studies:   Dg Chest 2 View  02/27/2015  CLINICAL DATA:  Chest pain and shortness of breath. History of rectal carcinoma.  EXAM: CHEST - 2 VIEW  COMPARISON:  09/13/2014  FINDINGS: Stable cardiac enlargement. Lung volumes are low with bibasilar atelectasis present. There is no evidence of pulmonary edema, consolidation, pneumothorax, nodule or pleural fluid. Stable osteopenia and spondylosis of the thoracic spine.   IMPRESSION: Low volumes with bibasilar atelectasis.  No active disease.   Electronically Signed   By: Aletta Edouard M.D.   On: 02/27/2015 11:28   Ct Head Wo Contrast  02/27/2015   CLINICAL DATA:  Prior low anterior resection of rectal carcinoma in May, 2013. Recent TURBT 02/23/2015 for transitional cell carcinoma. Prior history of melanoma. Patient presents with a generalized headache which began acutely earlier today. Patient had a similar episode 2 days ago.  EXAM: CT HEAD WITHOUT CONTRAST  TECHNIQUE: Contiguous axial images were obtained from the base of the skull through the vertex without intravenous contrast.  COMPARISON:  None.  FINDINGS: Ventricular system normal in size and appearance for age. No mass lesion. No midline shift. No acute hemorrhage or hematoma. No extra-axial fluid collections. No evidence of acute infarction. No focal brain parenchymal abnormality.  No skull fracture or other focal osseous abnormality involving the skull. Visualized paranasal sinuses, bilateral mastoid air cells and bilateral middle ear cavities well-aerated. Minimal bilateral carotid siphon atherosclerosis.  IMPRESSION: Normal intracranially.   Electronically Signed   By: Evangeline Dakin M.D.   On: 02/27/2015 11:15     Medical Consultants:    Oncology  Radiation Oncology  Urology  Anti-Infectives:    None.  Subjective:   Richard Warner denies dizziness, presyncope, chest pain, or shortness breath. Still having some hematuria, but this has slowed down considerably. Appetite is good. Bowels are moving.  Objective:    Filed Vitals:   03/17/15 1824 03/17/15 2042 03/17/15 2104 03/18/15 0554  BP: 134/69 126/61 134/69 128/57  Pulse: 94 89 86 94  Temp: 98 F (36.7 C) 98 F (36.7 C) 98.2 F (36.8 C) 98.2 F (36.8 C)  TempSrc: Oral Oral Oral Oral  Resp: 20 20 20 20   Height:      Weight:      SpO2: 98% 98% 100% 97%    Intake/Output Summary (Last 24 hours) at 03/18/15 2633 Last data  filed at 03/18/15 0759  Gross per 24 hour  Intake   3055 ml  Output    650 ml  Net   2405 ml    Exam: Gen:  NAD, obese Cardiovascular:  RRR, No M/R/ Respiratory:  Lungs CTAB Gastrointestinal:  Abdomen soft, NT/ND, + BS Extremities:  Trace edema   Data Reviewed:    Labs: Basic Metabolic Panel:  Recent Labs Lab 03/15/15 0445 03/15/15 1200 03/16/15 0528 03/17/15 0431 03/18/15 0350  NA 137 137 139 139 140  K 3.4* 3.7 3.8 3.8 4.1  CL 107 107 109 111 111  CO2 23 21* 21* 22 22  GLUCOSE 141* 120* 124* 126* 130*  BUN 22* 21* 17 17 15   CREATININE 1.79* 1.67* 1.63* 1.50* 1.53*  CALCIUM 7.8* 8.0* 7.8* 8.0* 8.1*   GFR Estimated Creatinine Clearance: 75.1 mL/min (by C-G formula based on Cr of 1.53). Liver Function Tests:  Recent Labs Lab 03/11/15 1710 03/15/15 1200 03/16/15 0528 03/17/15 0431  AST 18 18 17 18   ALT 15* 15* 16* 19  ALKPHOS 41 45 45 48  BILITOT 0.5 0.9 0.8 0.6  PROT 5.3* 4.9* 4.7* 5.0*  ALBUMIN 3.1* 2.7* 2.6* 2.7*  Coagulation profile  Recent Labs Lab 03/11/15 1710 03/12/15 0421 03/14/15 1154  INR 1.92* 1.82* 1.29    CBC:  Recent Labs Lab 03/11/15 1710  03/12/15 1023  03/14/15 0406 03/15/15 0445 03/16/15 0528 03/17/15 0431 03/18/15 0350  WBC 5.2  < > 4.8  < > 4.9 4.2 3.9* 4.0 4.2  NEUTROABS 4.4  --  4.1  --   --   --   --   --   --   HGB 4.6*  < > 5.9*  < > 7.7* 7.7* 7.7* 7.1* 8.5*  HCT 15.1*  < > 18.4*  < > 23.5* 23.8* 24.1* 23.1* 28.0*  MCV 77.4*  < > 77.3*  < > 80.2 80.4 82.5 83.1 85.1  PLT 204  < > 219  < > 193 180 177 181 165  < > = values in this interval not displayed. Cardiac Enzymes:  Recent Labs Lab 03/11/15 1710 03/12/15 0434  TROPONINI 0.14* 0.15*    Microbiology Recent Results (from the past 240 hour(s))  Urine culture     Status: None   Collection Time: 03/11/15  5:44 PM  Result Value Ref Range Status   Specimen Description URINE, CLEAN CATCH  Final   Special Requests NONE  Final   Culture MULTIPLE  SPECIES PRESENT, SUGGEST RECOLLECTION  Final   Report Status 03/13/2015 FINAL  Final     Medications:   . sodium chloride   Intravenous Once  . sodium chloride   Intravenous Once  . atorvastatin  80 mg Oral q1800  . capecitabine  2,000 mg Oral BID PC  . cholecalciferol  1,000 Units Oral Daily  . clopidogrel  75 mg Oral Daily  . doxazosin  2 mg Oral QHS  . ezetimibe  10 mg Oral Daily  . ferrous sulfate  325 mg Oral TID WC  . levothyroxine  88 mcg Oral QAC breakfast  . metoprolol tartrate  12.5 mg Oral BID  . sodium chloride  3 mL Intravenous Q12H   Continuous Infusions: . dextrose 5 % and 0.45% NaCl 75 mL/hr (03/18/15 0802)    Time spent: 25 minutes.   LOS: 6 days   [facility] Pager [phone]. If unable to reach me by pager, please call my cell phone at [phone].  *Please refer to [email or web address], password [email or web address] to get updated schedule on who will round on this patient, as hospitalists switch teams weekly. If 7PM-7AM, please contact night-coverage at [email or web address], password [email or web address] for any overnight needs.  03/18/2015, 8:12 AM

## 2015-03-18 NOTE — Progress Notes (Signed)
  Subjective: Patient reports : Has less hematuria.  He states that he has less blood clots.    Objective: Vital signs in last 24 hours: Temp:  [98 F (36.7 C)-98.2 F (36.8 C)] 98.2 F (36.8 C) (08/12 1500) Pulse Rate:  [78-102] 78 (08/12 1500) Resp:  [20] 20 (08/12 1500) BP: (126-134)/(57-69) 127/57 mmHg (08/12 1500) SpO2:  [97 %-100 %] 100 % (08/12 1500)  Intake/Output from previous day: 08/11 0701 - 08/12 0700 In: 2815 [P.O.:720; I.V.:800; Blood:695] Out: 650 [Urine:650] Intake/Output this shift:      Lab Results:  Recent Labs  03/16/15 0528 03/17/15 0431 03/18/15 0350  HGB 7.7* 7.1* 8.5*  HCT 24.1* 23.1* 28.0*   BMET  Recent Labs  03/17/15 0431 03/18/15 0350  NA 139 140  K 3.8 4.1  CL 111 111  CO2 22 22  GLUCOSE 126* 130*  BUN 17 15  CREATININE 1.50* 1.53*  CALCIUM 8.0* 8.1*   No results for input(s): LABPT, INR in the last 72 hours. No results for input(s): LABURIN in the last 72 hours. Results for orders placed or performed during the hospital encounter of 03/11/15  Urine culture     Status: None   Collection Time: 03/11/15  5:44 PM  Result Value Ref Range Status   Specimen Description URINE, CLEAN CATCH  Final   Special Requests NONE  Final   Culture MULTIPLE SPECIES PRESENT, SUGGEST RECOLLECTION  Final   Report Status 03/13/2015 FINAL  Final    Studies/Results: No results found.  Assessment/Plan:  Metastatic rectal cancer to the bladder  Gross hematuria with blood clots.  Anemia  I have discussed Mr Domangue with Dr Sherral Hammers at [facility].  He agrees that it is a reasonable plan to start radiation to the bladder.  He will see him in consultation at [facility]. He will need cystoscopy with fulguration of bleeders if hematuria persists.   LOS: 6 days   Richard Warner 03/18/2015, 7:43 PM

## 2015-03-18 NOTE — Telephone Encounter (Signed)
lvm with pt's xrt dates.

## 2015-03-18 NOTE — Telephone Encounter (Signed)
Richard Warner from [facility] has xeloda Rx and was asking what days pt is getting radiation.

## 2015-03-18 NOTE — Telephone Encounter (Signed)
TOC fu appt per laura--appt 03-31-15 at 930a

## 2015-03-19 LAB — CBC
HCT: 27.2 % — ABNORMAL LOW (ref 39.0–52.0)
Hemoglobin: 8.4 g/dL — ABNORMAL LOW (ref 13.0–17.0)
MCH: 26.7 pg (ref 26.0–34.0)
MCHC: 30.9 g/dL (ref 30.0–36.0)
MCV: 86.3 fL (ref 78.0–100.0)
PLATELETS: 148 10*3/uL — AB (ref 150–400)
RBC: 3.15 MIL/uL — AB (ref 4.22–5.81)
RDW: 17.5 % — ABNORMAL HIGH (ref 11.5–15.5)
WBC: 3.6 10*3/uL — ABNORMAL LOW (ref 4.0–10.5)

## 2015-03-19 MED ORDER — FERROUS SULFATE 325 (65 FE) MG PO TABS: 325.0000 mg | ORAL_TABLET | Freq: Three times a day (TID) | ORAL | Status: DC

## 2015-03-19 NOTE — Progress Notes (Signed)
Pt given discharge, medications and f/u appt instructions, verbalized understanding, IV and telemetry removed, call placed to oncology for chemo script for pt, response pending, pt did not wait for response, family to transport home.

## 2015-03-19 NOTE — Discharge Summary (Addendum)
Physician Discharge Summary  [name] GBT:[id number] DOB: [birth year] DOA: 03/11/2015  PCP: Rachell Cipro, MD  Admit date: 03/11/2015 Discharge date: 03/19/2015   Recommendations for Outpatient Follow-Up:   1. Recommend F/U CBC at appt at [facility] 03/20/15.  Oncologist or Radiation Oncologist, please arrange.   Discharge Diagnosis:   Principal Problem:   Gross hematuria with acute blood loss anemia requiring transfusion support Active Problems:    Hypothyroidism    Obesity, Class III, BMI 40-49.9 (morbid obesity)    RENAL DISEASE, CHRONIC, STAGE III    Rectal cancer, pT3pN0 (0/12 LN) - mid rectum    Acute MI inferior posterior subsequent episode care    Bleeding    CAD in native artery    Chronic pulmonary embolism    Acute blood loss anemia   Discharge disposition:  Home.   Discharge Condition: Improved.  Diet recommendation: Low sodium, heart healthy.    History of Present Illness:   Richard Warner is an 64 y.o. male with a PMH of hypertension, chronic kidney disease, rectal cancer, and recent STEMI, who was admitted 03/11/15 from cardiology officewith weakness and shortness of breath. Hemoglobin was 4.6. Per the patient report he was diagnosed with PE/DVT in 2011 and placed on Coumadin. He remained on Coumadin and in 2013 he was diagnosed with colon cancer. He underwent hemicolectomy and did well. In 2016 his CEA was elevated Dr. Benay Spice recommended a prostate biopsy. The first biopsy was negative for cancer but the patient bled for 3 weeks after having it done. Despite a negative biopsy the CEA was still elevated and a second prostate biopsy was done on 7/20 (which was positive for adenocarcinoma). On 7/24 the patient had a STEMI. A drug-eluting stent was inserted and he was placed on aspirin/Plavix/Coumadin therapy. On Thursday 7/28 the patient began to have gross hematuria. He was not in any pain or distress so he waited until his appointment  on 8/5 to seek medical attention. At his cardiology appointment he was found to be pale, weak, and short of breath and was sent to the hospital for admission. Since admission he has received 8 units of packed red blood cells. He continues to have hematuria with clots. No Foley has been placed due to concern for possible trauma. Urology is consulting and recommends tracking post void residuals to ensure he does not become obstructed due to clots. His INR on admission was 1.92.    Hospital Course by Problem:   Principal Problem:  Gross hematuria / acute blood loss anemia secondary to genitourinary hemorrhage in the setting of anticoagulation and antiplatelet therapy and a bladder mass status post biopsy confirming metastatic colorectal adenocarcinoma - Secondary to a combination of recent TURBT (7/20) and anti-platelet plus anticoagulation therapy. - Status post a total of 8 units of PRBCs, with a relatively stable hemoglobin at discharge. - S/P DES placement on 02/27/15, and therefore will need to continue Plavix. - Aspirin and Coumadin have been discontinued. Once the patient is no longer bleeding can restart aspirin 81 mg a day.  - Being followed by oncology and radiation oncology with plan to proceed with cystoscopic intervention/radiation and sensitizing chemotherapy. Note: He does not have a script for Xeloda!  This will need to be addressed by oncologist on 03/21/15.  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 Status post hemicolectomy in 2013. - Management per Dr. Gearldine Shown note. On Xeloda.   Hypothyroidism - Continue levothyroxine.   Obesity,  Class III, BMI 40-49.9 (morbid obesity)   RENAL DISEASE, CHRONIC, STAGE III - Current creatinine consistent with usual baseline values.   Acute MI inferior posterior subsequent episode care/CAD in native artery - Being followed by cardiology. Continue Plavix. Aspirin and  Coumadin remain on hold. - Continue beta blocker, statin and Zetia.   Chronic pulmonary embolism - Coumadin on hold secondary to active ongoing bleeding.  Medical Consultants:    Urology: Bjorn Loser, MD  Oncology: Ladell Pier, MD  Cardiology: Burnell Blanks, MD  Discharge Exam:   Filed Vitals:   03/19/15 0505  BP: 120/59  Pulse: 84  Temp: 97.9 F (36.6 C)  Resp: 18   Filed Vitals:   03/18/15 2020 03/18/15 2044 03/18/15 2235 03/19/15 0505  BP: 126/57 135/63 131/67 120/59  Pulse: 95 97 95 84  Temp: 98.4 F (36.9 C) 98.4 F (36.9 C)  97.9 F (36.6 C)  TempSrc: Oral Oral  Oral  Resp: 20 18  18   Height:      Weight:      SpO2: 100% 98%  99%    Gen:  NAD Cardiovascular:  RRR, No M/R/G Respiratory: Lungs CTAB Gastrointestinal: Abdomen soft, NT/ND with normal active bowel sounds. Extremities: Trace edema   The results of significant diagnostics from this hospitalization (including imaging, microbiology, ancillary and laboratory) are listed below for reference.     Procedures and Diagnostic Studies:   Dg Chest 2 View  02/27/2015   CLINICAL DATA:  Chest pain and shortness of breath. History of rectal carcinoma.  EXAM: CHEST - 2 VIEW  COMPARISON:  09/13/2014  FINDINGS: Stable cardiac enlargement. Lung volumes are low with bibasilar atelectasis present. There is no evidence of pulmonary edema, consolidation, pneumothorax, nodule or pleural fluid. Stable osteopenia and spondylosis of the thoracic spine.  IMPRESSION: Low volumes with bibasilar atelectasis.  No active disease.   Electronically Signed   By: Aletta Edouard M.D.   On: 02/27/2015 11:28   Ct Head Wo Contrast  02/27/2015   CLINICAL DATA:  Prior low anterior resection of rectal carcinoma in May, 2013. Recent TURBT 02/23/2015 for transitional cell carcinoma. Prior history of melanoma. Patient presents with a generalized headache which began acutely earlier today. Patient had a similar episode 2  days ago.  EXAM: CT HEAD WITHOUT CONTRAST  TECHNIQUE: Contiguous axial images were obtained from the base of the skull through the vertex without intravenous contrast.  COMPARISON:  None.  FINDINGS: Ventricular system normal in size and appearance for age. No mass lesion. No midline shift. No acute hemorrhage or hematoma. No extra-axial fluid collections. No evidence of acute infarction. No focal brain parenchymal abnormality.  No skull fracture or other focal osseous abnormality involving the skull. Visualized paranasal sinuses, bilateral mastoid air cells and bilateral middle ear cavities well-aerated. Minimal bilateral carotid siphon atherosclerosis.  IMPRESSION: Normal intracranially.   Electronically Signed   By: Evangeline Dakin M.D.   On: 02/27/2015 11:15     Labs:   Basic Metabolic Panel:  Recent Labs Lab 03/15/15 0445 03/15/15 1200 03/16/15 0528 03/17/15 0431 03/18/15 0350 03/18/15 2140  NA 137 137 139 139 140  --   K 3.4* 3.7 3.8 3.8 4.1 3.9  CL 107 107 109 111 111  --   CO2 23 21* 21* 22 22  --   GLUCOSE 141* 120* 124* 126* 130*  --   BUN 22* 21* 17 17 15   --   CREATININE 1.79* 1.67* 1.63* 1.50* 1.53*  --  CALCIUM 7.8* 8.0* 7.8* 8.0* 8.1*  --   MG  --   --   --   --   --  2.1   GFR Estimated Creatinine Clearance: 75.1 mL/min (by C-G formula based on Cr of 1.53). Liver Function Tests:  Recent Labs Lab 03/15/15 1200 03/16/15 0528 03/17/15 0431  AST 18 17 18   ALT 15* 16* 19  ALKPHOS 45 45 48  BILITOT 0.9 0.8 0.6  PROT 4.9* 4.7* 5.0*  ALBUMIN 2.7* 2.6* 2.7*   Coagulation profile  Recent Labs Lab 03/14/15 1154  INR 1.29    CBC:  Recent Labs Lab 03/15/15 0445 03/16/15 0528 03/17/15 0431 03/18/15 0350 03/18/15 2140 03/19/15 0355  WBC 4.2 3.9* 4.0 4.2  --  3.6*  HGB 7.7* 7.7* 7.1* 8.5* 9.3* 8.4*  HCT 23.8* 24.1* 23.1* 28.0* 29.0* 27.2*  MCV 80.4 82.5 83.1 85.1  --  86.3  PLT 180 177 181 165  --  148*   Microbiology Recent Results (from the past  240 hour(s))  Urine culture     Status: None   Collection Time: 03/11/15  5:44 PM  Result Value Ref Range Status   Specimen Description URINE, CLEAN CATCH  Final   Special Requests NONE  Final   Culture MULTIPLE SPECIES PRESENT, SUGGEST RECOLLECTION  Final   Report Status 03/13/2015 FINAL  Final     Discharge Instructions:   Discharge Instructions    Call MD for:  difficulty breathing, headache or visual disturbances    Complete by:  As directed      Call MD for:  extreme fatigue    Complete by:  As directed      Call MD for:  persistant dizziness or light-headedness    Complete by:  As directed      Diet - low sodium heart healthy    Complete by:  As directed      Increase activity slowly    Complete by:  As directed             Medication List    STOP taking these medications        aspirin EC 81 MG tablet     warfarin 5 MG tablet  Commonly known as:  COUMADIN      TAKE these medications        atorvastatin 80 MG tablet  Commonly known as:  LIPITOR  Take 1 tablet (80 mg total) by mouth daily at 6 PM.     capecitabine 500 MG tablet  Commonly known as:  XELODA  Take 4 tablets (2,000 mg total) by mouth 2 (two) times daily after a meal. ON DAYS OF RADIATION ONLY     clopidogrel 75 MG tablet  Commonly known as:  PLAVIX  Take 1 tablet (75 mg total) by mouth daily with breakfast.     doxazosin 2 MG tablet  Commonly known as:  CARDURA  Take 2 mg by mouth daily after supper.     ezetimibe 10 MG tablet  Commonly known as:  ZETIA  Take 10 mg by mouth daily after supper.     ferrous sulfate 325 (65 FE) MG tablet  Take 1 tablet (325 mg total) by mouth 3 (three) times daily with meals.     levothyroxine 88 MCG tablet  Commonly known as:  SYNTHROID, LEVOTHROID  Take 88 mcg by mouth daily before breakfast.     metoprolol tartrate 25 MG tablet  Commonly known as:  LOPRESSOR  Take 0.5 tablets (  12.5 mg total) by mouth 2 (two) times daily.     nitroGLYCERIN 0.4 MG  SL tablet  Commonly known as:  NITROSTAT  Place 1 tablet (0.4 mg total) under the tongue every 5 (five) minutes as needed for chest pain.     Vitamin D3 5000 UNITS Tabs  Take 5,000 Units by mouth daily after supper.           Follow-up Information    Follow up with [name], MD On 03/31/2015.   Specialty:  Cardiology   Why:  with Cecilie Kicks, NP-C for follow up post hospital  at 9:30 AM    Contact information:   [address] [place] [address] [phone]       Follow up with [name], MD On 05/19/2015.   Specialty:  Cardiology   Why:  at 10:30 AM   Contact information:   [address] [place] [address] [phone]       Follow up with Betsy Coder, MD. Schedule an appointment as soon as possible for a visit in 2 days.   Specialty:  Oncology   Why:  To have your blood counts re-checked.   Contact information:   [place] [address] [phone]        Time coordinating discharge: 35 minutes.  Signed:  Aleiyah Halpin  Pager [phone] [facility] 03/19/2015, 12:47 PM

## 2015-03-19 NOTE — Progress Notes (Signed)
Primary cardiologist: Dr. [name]  Seen for followup: CAD, recent STEMI s/p DES OM1  Subjective:    No chest pain, dizziness, palpitations. Eating breakfast.  Objective:   Temp:  [97.9 F (36.6 C)-98.4 F (36.9 C)] 97.9 F (36.6 C) (08/13 0505) Pulse Rate:  [78-102] 84 (08/13 0505) Resp:  [18-20] 18 (08/13 0505) BP: (120-135)/(57-67) 120/59 mmHg (08/13 0505) SpO2:  [98 %-100 %] 99 % (08/13 0505) Last BM Date: 03/17/15  Filed Weights   03/14/15 0500 03/15/15 0500 03/16/15 1550  Weight: 352 lb 8 oz (159.893 kg) 352 lb 11.2 oz (159.984 kg) 351 lb 8 oz (159.439 kg)    Intake/Output Summary (Last 24 hours) at 03/19/15 0909 Last data filed at 03/18/15 2238  Gross per 24 hour  Intake 2734.25 ml  Output      0 ml  Net 2734.25 ml    Telemetry: Sinus rhythm, PVCs noted.  Exam:  General: Morbidly obese, no distress.  Lungs: Clear, decreased breath sounds.  Cardiac: RRR without gallop.  Abdomen: Obese, nontender.  Extremities: No pitting edema.  Lab Results:  Basic Metabolic Panel:  Recent Labs Lab 03/16/15 0528 03/17/15 0431 03/18/15 0350 03/18/15 2140  NA 139 139 140  --   K 3.8 3.8 4.1 3.9  CL 109 111 111  --   CO2 21* 22 22  --   GLUCOSE 124* 126* 130*  --   BUN 17 17 15   --   CREATININE 1.63* 1.50* 1.53*  --   CALCIUM 7.8* 8.0* 8.1*  --   MG  --   --   --  2.1    Liver Function Tests:  Recent Labs Lab 03/15/15 1200 03/16/15 0528 03/17/15 0431  AST 18 17 18   ALT 15* 16* 19  ALKPHOS 45 45 48  BILITOT 0.9 0.8 0.6  PROT 4.9* 4.7* 5.0*  ALBUMIN 2.7* 2.6* 2.7*    CBC:  Recent Labs Lab 03/17/15 0431 03/18/15 0350 03/18/15 2140 03/19/15 0355  WBC 4.0 4.2  --  3.6*  HGB 7.1* 8.5* 9.3* 8.4*  HCT 23.1* 28.0* 29.0* 27.2*  MCV 83.1 85.1  --  86.3  PLT 181 165  --  148*    Coagulation:  Recent Labs Lab 03/14/15 1154  INR 1.29    Medications:   Scheduled Medications: . sodium chloride   Intravenous Once  . sodium  chloride   Intravenous Once  . atorvastatin  80 mg Oral q1800  . capecitabine  2,000 mg Oral BID PC  . cholecalciferol  1,000 Units Oral Daily  . clopidogrel  75 mg Oral Daily  . docusate sodium  100 mg Oral BID  . doxazosin  2 mg Oral QHS  . ezetimibe  10 mg Oral Daily  . ferrous sulfate  325 mg Oral TID WC  . levothyroxine  88 mcg Oral QAC breakfast  . metoprolol tartrate  12.5 mg Oral BID  . sodium chloride  3 mL Intravenous Q12H     Infusions: . dextrose 5 % and 0.45% NaCl 75 mL/hr at 03/18/15 2238      PRN Medications:  acetaminophen, HYDROcodone-acetaminophen   Assessment:   1. Severe blood loss anemia secondary to urinary tract blood loss. Patient is been followed by Urology, some hematuria and clots passed, hemoglobin 8.4 down from 9.3. Cardiac medications adjusted including discontinuation of aspirin and Coumadin, although he continues on Plavix with recent ACS and DES.  2. Recent STEMI in late July status post DES to OM1, LVEF  50-55%. Continuing Plavix alone at this time.  3. Prior history of DVT and pulmonary embolus in 2011, last DVT in 2013. Coumadin has been discontinued as noted above.  4. Rectal and bladder cancer, status post TURBT. He is followed by [facility] he at [facility].  5. CKD, stage 3.   Plan/Discussion:    Reviewed cardiac medications. Continue on Plavix, Lipitor, Zetia, and Lopressor. May be able to eventually resume low-dose aspirin pending stabilization of urological process, hemoglobin, and follow-up with oncology. Office follow up scheduled with our practice per Dr. Kennon Holter note.   Satira Sark, M.D., F.A.C.C.

## 2015-03-19 NOTE — Discharge Instructions (Signed)

## 2015-03-21 ENCOUNTER — Telehealth: Payer: Self-pay | Admitting: *Deleted

## 2015-03-21 ENCOUNTER — Ambulatory Visit
Admit: 2015-03-21 | Discharge: 2015-03-21 | Disposition: A | Discharge status: Home / Self Care | Payer: Medicare Other | Attending: Radiation Oncology | Admitting: Radiation Oncology

## 2015-03-21 ENCOUNTER — Other Ambulatory Visit (HOSPITAL_BASED_OUTPATIENT_CLINIC_OR_DEPARTMENT_OTHER): Payer: Medicare Other

## 2015-03-21 DIAGNOSIS — C20 Malignant neoplasm of rectum: Secondary | ICD-10-CM

## 2015-03-21 DIAGNOSIS — C7911 Secondary malignant neoplasm of bladder: Secondary | ICD-10-CM

## 2015-03-21 DIAGNOSIS — Z510 Encounter for antineoplastic radiation therapy: Secondary | ICD-10-CM | POA: Diagnosis not present

## 2015-03-21 LAB — CBC WITH DIFFERENTIAL/PLATELET
BASO%: 0.5 % (ref 0.0–2.0)
Basophils Absolute: 0 10*3/uL (ref 0.0–0.1)
EOS%: 4.4 % (ref 0.0–7.0)
Eosinophils Absolute: 0.2 10*3/uL (ref 0.0–0.5)
HCT: 28.5 % — ABNORMAL LOW (ref 38.4–49.9)
HGB: 9.1 g/dL — ABNORMAL LOW (ref 13.0–17.1)
LYMPH%: 11.6 % — ABNORMAL LOW (ref 14.0–49.0)
MCH: 27.2 pg (ref 27.2–33.4)
MCHC: 31.9 g/dL — AB (ref 32.0–36.0)
MCV: 85.1 fL (ref 79.3–98.0)
MONO#: 0.3 10*3/uL (ref 0.1–0.9)
MONO%: 8 % (ref 0.0–14.0)
NEUT%: 75.5 % — AB (ref 39.0–75.0)
NEUTROS ABS: 3.1 10*3/uL (ref 1.5–6.5)
Platelets: 149 10*3/uL (ref 140–400)
RBC: 3.35 10*6/uL — ABNORMAL LOW (ref 4.20–5.82)
RDW: 17.4 % — ABNORMAL HIGH (ref 11.0–14.6)
WBC: 4.1 10*3/uL (ref 4.0–10.3)
lymph#: 0.5 10*3/uL — ABNORMAL LOW (ref 0.9–3.3)

## 2015-03-21 LAB — HOLD TUBE, BLOOD BANK

## 2015-03-21 NOTE — Telephone Encounter (Signed)
Left message to call back to discuss d/c instructions, appt date/time and to make sure pt doesn't have any questions or concerns.

## 2015-03-21 NOTE — Telephone Encounter (Signed)
Patient contacted regarding discharge from [facility] on 03/19/2015.  Patient understands to follow up with provider Cecilie Kicks on 8/24 at 9:30 at [facility] office. Patient understands discharge instructions? Yes Patient understands medications and regiment? yes Patient understands to bring all medications to this visit? yes   Pt denied any other concerns or questions.

## 2015-03-21 NOTE — Telephone Encounter (Signed)
Received telephone note via fax stating pt called regarding xeloda prescription. This script has already been sent to [facility]. Called pt to touch base with him regarding status of order: he stated "they called me an hr ago about shipping it out, and said they would call me later on". I told pt to call us if he has not received medication by 430 today so we could follow up. He voices understanding.

## 2015-03-22 ENCOUNTER — Ambulatory Visit
Admit: 2015-03-22 | Discharge: 2015-03-22 | Disposition: A | Discharge status: Home / Self Care | Payer: Medicare Other | Attending: Radiation Oncology | Admitting: Radiation Oncology

## 2015-03-22 ENCOUNTER — Encounter: Payer: Self-pay | Admitting: Cardiovascular Disease

## 2015-03-22 ENCOUNTER — Telehealth: Payer: Self-pay | Admitting: *Deleted

## 2015-03-22 DIAGNOSIS — Z510 Encounter for antineoplastic radiation therapy: Secondary | ICD-10-CM | POA: Diagnosis not present

## 2015-03-22 NOTE — Telephone Encounter (Signed)
TC  From [facility] @ [employer] stating the the cost for pt's Xeloda would be around $188. She wanted to know if the patient could afford this and should they go ahead with processing his xeloda.  Please advise

## 2015-03-22 NOTE — Telephone Encounter (Signed)
Discussed w/ Dr. Benay Spice regarding [phone] copay; pt will be on his 5th of 10 radiation treatment and wondered if it was worth paying for Xeloda at this point. Per Dr. Benay Spice, okay to complete radiation without Xeloda. Megan at [employer] made aware; I called pt to explain this to him, and he voiced understanding. Has all upcoming appts and knows to call us with any questions or concerns.

## 2015-03-22 NOTE — Telephone Encounter (Signed)
I spoke with Richard Warner and she will reschedule this pt's appointment with Dr Burt Knack to 05/23/15.

## 2015-03-23 ENCOUNTER — Ambulatory Visit
Admit: 2015-03-23 | Discharge: 2015-03-23 | Disposition: A | Discharge status: Home / Self Care | Payer: Medicare Other | Attending: Radiation Oncology | Admitting: Radiation Oncology

## 2015-03-23 ENCOUNTER — Ambulatory Visit
Admission: RE | Admit: 2015-03-23 | Discharge: 2015-03-23 | Disposition: A | Discharge status: Home / Self Care | Payer: Medicare Other | Source: Ambulatory Visit | Attending: Radiation Oncology | Admitting: Radiation Oncology

## 2015-03-23 ENCOUNTER — Encounter: Payer: Self-pay | Admitting: Radiation Oncology

## 2015-03-23 DIAGNOSIS — C7911 Secondary malignant neoplasm of bladder: Secondary | ICD-10-CM

## 2015-03-23 DIAGNOSIS — Z510 Encounter for antineoplastic radiation therapy: Secondary | ICD-10-CM | POA: Diagnosis not present

## 2015-03-23 NOTE — Progress Notes (Signed)
Weight and vitals stable. Denies dysuria. Reports nocturia x 1 last night which is much improved from the 3 times he is accustom to. Reports hematuria with clots continue but, amount and frequency are less. Without bowel complaints.   BP 123/78 mmHg  Pulse 92  Resp 16  Wt 361 lb 4.8 oz (163.885 kg) Wt Readings from Last 3 Encounters:  03/23/15 361 lb 4.8 oz (163.885 kg)  03/16/15 351 lb 8 oz (159.439 kg)  03/11/15 354 lb 6.4 oz (160.755 kg)

## 2015-03-23 NOTE — Progress Notes (Signed)
   Department of Radiation Oncology  Phone:  [phone] Fax:        [phone]  Weekly Treatment Note    Name: Richard Warner Date: 03/23/2015 MRN: [id number] DOB: June 28, 1951   Current dose: 15 Gy  Current fraction: 5   MEDICATIONS: Current Outpatient Prescriptions  Medication Sig Dispense Refill  . atorvastatin (LIPITOR) 80 MG tablet Take 1 tablet (80 mg total) by mouth daily at 6 PM. (Patient taking differently: Take 80 mg by mouth daily after supper. ) 30 tablet 5  . capecitabine (XELODA) 500 MG tablet Take 4 tablets (2,000 mg total) by mouth 2 (two) times daily after a meal. ON DAYS OF RADIATION ONLY 64 tablet 0  . Cholecalciferol (VITAMIN D3) 5000 UNITS TABS Take 5,000 Units by mouth daily after supper.     . clopidogrel (PLAVIX) 75 MG tablet Take 1 tablet (75 mg total) by mouth daily with breakfast. (Patient taking differently: Take 75 mg by mouth daily before breakfast. ) 30 tablet 5  . doxazosin (CARDURA) 2 MG tablet Take 2 mg by mouth daily after supper.     . ezetimibe (ZETIA) 10 MG tablet Take 10 mg by mouth daily after supper.     . ferrous sulfate 325 (65 FE) MG tablet Take 1 tablet (325 mg total) by mouth 3 (three) times daily with meals. 90 tablet 3  . levothyroxine (SYNTHROID, LEVOTHROID) 88 MCG tablet Take 88 mcg by mouth daily before breakfast.    . metoprolol tartrate (LOPRESSOR) 25 MG tablet Take 0.5 tablets (12.5 mg total) by mouth 2 (two) times daily. 60 tablet 5  . nitroGLYCERIN (NITROSTAT) 0.4 MG SL tablet Place 1 tablet (0.4 mg total) under the tongue every 5 (five) minutes as needed for chest pain. 25 tablet 2  . warfarin (COUMADIN) 5 MG tablet   1   No current facility-administered medications for this encounter.     ALLERGIES: Review of patient's allergies indicates no known allergies.   LABORATORY DATA:  Lab Results  Component Value Date   WBC 4.1 03/21/2015   HGB 9.1* 03/21/2015   HCT 28.5* 03/21/2015   MCV 85.1 03/21/2015   PLT 149  03/21/2015   Lab Results  Component Value Date   NA 140 03/18/2015   K 3.9 03/18/2015   CL 111 03/18/2015   CO2 22 03/18/2015   Lab Results  Component Value Date   ALT 19 03/17/2015   AST 18 03/17/2015   ALKPHOS 48 03/17/2015   BILITOT 0.6 03/17/2015   NARRATIVE: Richard Warner was seen today for weekly treatment management. The chart was checked and the patient's films were reviewed. Denies dysuria. Reports nocturia x 1 last night which is much improved from the 3 times he is accustom to. Reports hematuria with clots continuing, but amount and frequency is less. He is without bowel complaints.   PHYSICAL EXAMINATION: weight is 361 lb 4.8 oz (163.885 kg). His blood pressure is 123/78 and his pulse is 92. His respiration is 16.        ASSESSMENT: The patient is doing satisfactorily with treatment.  PLAN: We will continue with the patient's radiation treatment as planned. The pt is scheduled to see Dr. Sherral Hammers at [facility] on 8/29.

## 2015-03-23 NOTE — Telephone Encounter (Addendum)
Richard Warner after receipt of voicemail requesting clarification of an order.  [employer] notified [employer] Dr. Benay Spice has discontinued capecitabine order for this patient.  Confirmed according to this EMR documentation.

## 2015-03-24 ENCOUNTER — Ambulatory Visit (HOSPITAL_BASED_OUTPATIENT_CLINIC_OR_DEPARTMENT_OTHER): Payer: Medicare Other | Admitting: Nurse Practitioner

## 2015-03-24 ENCOUNTER — Other Ambulatory Visit (HOSPITAL_BASED_OUTPATIENT_CLINIC_OR_DEPARTMENT_OTHER): Payer: Medicare Other

## 2015-03-24 ENCOUNTER — Telehealth: Payer: Self-pay | Admitting: Nurse Practitioner

## 2015-03-24 ENCOUNTER — Ambulatory Visit
Admission: RE | Admit: 2015-03-24 | Discharge: 2015-03-24 | Disposition: A | Discharge status: Home / Self Care | Payer: Medicare Other | Source: Ambulatory Visit | Attending: Radiation Oncology | Admitting: Radiation Oncology

## 2015-03-24 ENCOUNTER — Telehealth: Payer: Self-pay | Admitting: *Deleted

## 2015-03-24 VITALS — BP 165/79 | HR 98 | Temp 98.0°F | Resp 20 | Ht 71.0 in | Wt 359.6 lb

## 2015-03-24 DIAGNOSIS — D62 Acute posthemorrhagic anemia: Secondary | ICD-10-CM

## 2015-03-24 DIAGNOSIS — Z510 Encounter for antineoplastic radiation therapy: Secondary | ICD-10-CM | POA: Diagnosis not present

## 2015-03-24 DIAGNOSIS — C7911 Secondary malignant neoplasm of bladder: Secondary | ICD-10-CM

## 2015-03-24 DIAGNOSIS — C20 Malignant neoplasm of rectum: Secondary | ICD-10-CM | POA: Diagnosis not present

## 2015-03-24 DIAGNOSIS — R310 Gross hematuria: Secondary | ICD-10-CM

## 2015-03-24 LAB — BASIC METABOLIC PANEL (CC13)
ANION GAP: 9 meq/L (ref 3–11)
BUN: 14.8 mg/dL (ref 7.0–26.0)
CO2: 22 mEq/L (ref 22–29)
Calcium: 8.7 mg/dL (ref 8.4–10.4)
Chloride: 111 mEq/L — ABNORMAL HIGH (ref 98–109)
Creatinine: 1.5 mg/dL — ABNORMAL HIGH (ref 0.7–1.3)
EGFR: 49 mL/min/{1.73_m2} — ABNORMAL LOW (ref >90)
Glucose: 116 mg/dl (ref 70–140)
POTASSIUM: 4.4 meq/L (ref 3.5–5.1)
SODIUM: 142 meq/L (ref 136–145)

## 2015-03-24 LAB — CBC WITH DIFFERENTIAL/PLATELET
BASO%: 0 % (ref 0.0–2.0)
Basophils Absolute: 0 10*3/uL (ref 0.0–0.1)
EOS%: 3.3 % (ref 0.0–7.0)
Eosinophils Absolute: 0.1 10*3/uL (ref 0.0–0.5)
HCT: 27.3 % — ABNORMAL LOW (ref 38.4–49.9)
HGB: 8.5 g/dL — ABNORMAL LOW (ref 13.0–17.1)
LYMPH%: 13.4 % — AB (ref 14.0–49.0)
MCH: 27.6 pg (ref 27.2–33.4)
MCHC: 31.1 g/dL — AB (ref 32.0–36.0)
MCV: 88.6 fL (ref 79.3–98.0)
MONO#: 0.2 10*3/uL (ref 0.1–0.9)
MONO%: 7.9 % (ref 0.0–14.0)
NEUT#: 2.3 10*3/uL (ref 1.5–6.5)
NEUT%: 75.4 % — AB (ref 39.0–75.0)
PLATELETS: 130 10*3/uL — AB (ref 140–400)
RBC: 3.08 10*6/uL — AB (ref 4.20–5.82)
RDW: 20.2 % — ABNORMAL HIGH (ref 11.0–14.6)
WBC: 3.1 10*3/uL — ABNORMAL LOW (ref 4.0–10.3)
lymph#: 0.4 10*3/uL — ABNORMAL LOW (ref 0.9–3.3)

## 2015-03-24 LAB — CEA: CEA: 24.5 ng/mL — ABNORMAL HIGH (ref 0.0–5.0)

## 2015-03-24 LAB — HOLD TUBE, BLOOD BANK

## 2015-03-24 NOTE — Progress Notes (Addendum)
[place] OFFICE PROGRESS NOTE   Diagnosis:  Rectal cancer  INTERVAL HISTORY:   Richard Warner returns as scheduled. He began palliative radiation to the bladder and concurrent Xeloda 03/17/2015. He was unable to obtain Xeloda in a timely manner following discharge from the hospital. He is completing the course of radiation without Xeloda. The hematuria is much better. He denies nausea/vomiting. No diarrhea. No mouth sores. He denies shortness of breath. No chest pain. Appetite is beginning to improve.  Objective:  Vital signs in last 24 hours:  Blood pressure 165/79, pulse 98, temperature 98 F (36.7 C), temperature source Oral, resp. rate 20, height 5\' 11"  (1.803 m), weight 359 lb 9.6 oz (163.113 kg), SpO2 99 %.    HEENT: No thrush or ulcers. Resp: Lungs clear bilaterally. Cardio: Regular rate and rhythm. GI: No hepatomegaly. Nontender. Vascular: Chronic stasis changes at the lower legs bilaterally.   Lab Results:  Lab Results  Component Value Date   WBC 3.1* 03/24/2015   HGB 8.5* 03/24/2015   HCT 27.3* 03/24/2015   MCV 88.6 03/24/2015   PLT 130* 03/24/2015   NEUTROABS 2.3 03/24/2015    Imaging:  No results found.  Medications: I have reviewed the patient's current medications.  Assessment/Plan: 1. Rectal cancer, stage II (pT3 pN0) status post low anterior resection 12/14/2011. The tumor was noted to be at 10 cm on a rigid proctoscopy at the time of surgery. He completed radiation and Xeloda from 01/31/2012-03/11/2012. He began a first cycle of adjuvant Xeloda on 04/22/2012. He completed cycle 5 beginning 07/17/2012. He completed cycle 6 beginning 08/08/2012.  Surveillance colonoscopy 05/15/2013, status post removal of tubular adenomas. Next colonoscopy 3 years.  Elevated CEA 06/18/2014  PET scan 06/30/2014 with mild focal hypermetabolism at the anorectal junction favored to be physiologic in the absence of associated CT abnormality; irregular  bladder wall thickening.  Colonoscopy 07/27/2014 with removal of a sessile ascending colon polyp-tubular adenoma, no other significant findings  PET scan 12/27/2014: status post low anterior resection. No evidence of hypermetabolic recurrent or metastatic disease. Similar hypermetabolism about the anus.  Lower endoscopic ultrasound 01/06/2015 with findings of nonspecific 4-5 mm thickening of the rectal wall just distal to the LAR anastomosis. Fine-needle aspiration was negative for malignant cells.  Evaluated by Dr. Janice Norrie for microscopic hematuria including a cystoscopic biopsy of an erythematous/edematous area of the bladder on 12/03/2014-negative for malignancy  Repeat cystoscopy confirmed a 3 cm anterior bladder mass, status post cystoscopic resection 02/23/2015 with the pathology confirming metastatic colorectal adenocarcinoma  Initiation of palliative radiation to the bladder and concurrent capecitabine on 03/17/2015. He completed 3 days of Xeloda and is now completing radiation alone. 2. History of iron deficiency anemia likely secondary to rectal bleeding.  3. 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 He is maintained on Coumadin.  4. Renal insufficiency.  5. Hypertension. 6. Chronic mild thrombocytopenia 7. Hypothyroid. 8. Microscopic hematuria on a urinalysis 07/02/2014  Evaluated by Dr. Janice Norrie including a cystoscopic biopsy of an erythematous/edematous area of the bladder on 12/03/2014-negative for malignancy  Repeat cystoscopy confirmed a 3 cm anterior bladder mass, status post cystoscopic resection 02/23/2015 with the pathology confirming metastatic colorectal adenocarcinoma  Initiation of palliative radiation to the bladder and concurrent capecitabine on 03/17/2015 9. Right anterior chest wall skin lesion/mass status post excision 09/20/2014. 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 Margins not involved. No evidence of  angiolymphatic invasion or perineural invasion. 10. Gross hematuria, intermittent since bladder biopsy 12/03/2014.  11. Admission  02/27/2015 with an acute myocardial infarction, status post a PCI and DES of first marginal lesion 12. Admission 03/11/2015 with severe symptomatic anemia secondary to gross hematuria   Disposition: Richard Warner is completing the course of radiation. Hematuria is much improved. He was unable to obtain Xeloda following hospital discharge.  He will complete the course of radiation on 03/30/2015. He has an appointment with Dr. Sherral Hammers at [facility] on 04/04/2015 to discuss possible resection.   Hemoglobin remains stable. We will repeat a CBC on 03/28/2015.  He will return for a follow-up visit here on 04/06/2015.  Patient seen with Dr. Benay Spice.    Richard Warner   03/24/2015  10:13 AM  This was a shared visit with Richard Warner.  The hematuria has improved and the hemoglobin has stabilized. We will consider resuming anticoagulation therapy after the completion of palliative radiation.  He will be seen at [facility] on 04/04/2015 to consider the indication for resection of the bladder mass. We will see him on 04/06/2015 to discuss systemic treatment options.  Richard Warner, M.D.

## 2015-03-24 NOTE — Telephone Encounter (Signed)
INFORMED Richard Warner OF PT.'S SCHEDULE OF APPOINTMENTS TO HELP WITH CONTACTING PT.

## 2015-03-24 NOTE — Telephone Encounter (Signed)
Appointments made and avs printed for patient °

## 2015-03-25 ENCOUNTER — Encounter: Payer: Self-pay | Admitting: Radiation Oncology

## 2015-03-25 ENCOUNTER — Ambulatory Visit
Admission: RE | Admit: 2015-03-25 | Discharge: 2015-03-25 | Disposition: A | Discharge status: Home / Self Care | Payer: Medicare Other | Source: Ambulatory Visit | Attending: Radiation Oncology | Admitting: Radiation Oncology

## 2015-03-25 ENCOUNTER — Ambulatory Visit
Admit: 2015-03-25 | Discharge: 2015-03-25 | Disposition: A | Discharge status: Home / Self Care | Payer: Medicare Other | Attending: Radiation Oncology | Admitting: Radiation Oncology

## 2015-03-25 VITALS — BP 136/82 | HR 96 | Wt 362.1 lb

## 2015-03-25 DIAGNOSIS — C7911 Secondary malignant neoplasm of bladder: Secondary | ICD-10-CM

## 2015-03-25 DIAGNOSIS — Z510 Encounter for antineoplastic radiation therapy: Secondary | ICD-10-CM | POA: Diagnosis not present

## 2015-03-25 NOTE — Progress Notes (Signed)
  Radiation Oncology         [phone]     Name: Richard Warner MRN: [id number]   Date: 03/25/2015  DOB: [birth year]    Weekly Radiation Therapy Management    ICD-9-CM ICD-10-CM   1. 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 Weight and vitals stable. Denies dysuria. Reports nocturia x 1 last night which is much improved from the 3 times he is accustom to. Reports hematuria has resolved for the most part. Reports if he does pass a clot in his urine it is very small. Denies diarrhea. Water blister inside of lower right leg oozing. The RN cleansed site with normal saline and applied triple action antibiotic. Denies pain. One month follow up appointment card given.  The patient is without complaint. Set-up films were reviewed. The chart was checked.  Physical Findings  No significant changes.   Impression The patient is tolerating radiation. His hematuria seems to have resolved.  Plan Continue treatment as planned. Patient will complete radiation next week. Patient is scheduled to seen at [facility] on 8/29 regarding possible salvage partial cystectomy or cystectomy. We will follow up on 9/29.    This document serves as a record of services personally performed by Tyler Pita, MD. It was created on his behalf by Arlyce Harman, a trained medical scribe. The creation of this record is based on the scribe's personal observations and the provider's statements to them. This document has been checked and approved by the attending provider.       Sheral Apley Tammi Klippel, M.D.

## 2015-03-25 NOTE — Progress Notes (Signed)
Weight and vitals stable. Denies dysuria. Reports nocturia x 1 last night which is much improved from the 3 times he is accustom to. Reports hematuria has resolved for the most part. Reports if he does pass a clot in his urine it is very small. Denies diarrhea. Water blister inside of lower right leg oozing. This RN cleansed site with normal saline and applied triple action antibiotic. Denies pain. One month follow up appointment card given.   BP 136/82 mmHg  Pulse 96  Wt 362 lb 1.6 oz (164.247 kg) Wt Readings from Last 3 Encounters:  03/25/15 362 lb 1.6 oz (164.247 kg)  03/24/15 359 lb 9.6 oz (163.113 kg)  03/23/15 361 lb 4.8 oz (163.885 kg)

## 2015-03-28 ENCOUNTER — Other Ambulatory Visit (HOSPITAL_BASED_OUTPATIENT_CLINIC_OR_DEPARTMENT_OTHER): Payer: Medicare Other

## 2015-03-28 ENCOUNTER — Ambulatory Visit
Admit: 2015-03-28 | Discharge: 2015-03-28 | Disposition: A | Discharge status: Home / Self Care | Payer: Medicare Other | Attending: Radiation Oncology | Admitting: Radiation Oncology

## 2015-03-28 ENCOUNTER — Encounter: Payer: Self-pay | Admitting: Oncology

## 2015-03-28 DIAGNOSIS — R310 Gross hematuria: Secondary | ICD-10-CM

## 2015-03-28 DIAGNOSIS — C7911 Secondary malignant neoplasm of bladder: Secondary | ICD-10-CM

## 2015-03-28 DIAGNOSIS — C20 Malignant neoplasm of rectum: Secondary | ICD-10-CM

## 2015-03-28 DIAGNOSIS — Z510 Encounter for antineoplastic radiation therapy: Secondary | ICD-10-CM | POA: Diagnosis not present

## 2015-03-28 DIAGNOSIS — D62 Acute posthemorrhagic anemia: Secondary | ICD-10-CM

## 2015-03-28 LAB — CBC WITH DIFFERENTIAL/PLATELET
BASO%: 0.7 % (ref 0.0–2.0)
Basophils Absolute: 0 10*3/uL (ref 0.0–0.1)
EOS%: 2.7 % (ref 0.0–7.0)
Eosinophils Absolute: 0.1 10*3/uL (ref 0.0–0.5)
HCT: 30.3 % — ABNORMAL LOW (ref 38.4–49.9)
HGB: 9.8 g/dL — ABNORMAL LOW (ref 13.0–17.1)
LYMPH#: 0.4 10*3/uL — AB (ref 0.9–3.3)
LYMPH%: 11.9 % — ABNORMAL LOW (ref 14.0–49.0)
MCH: 28.6 pg (ref 27.2–33.4)
MCHC: 32.4 g/dL (ref 32.0–36.0)
MCV: 88.4 fL (ref 79.3–98.0)
MONO#: 0.3 10*3/uL (ref 0.1–0.9)
MONO%: 8.8 % (ref 0.0–14.0)
NEUT#: 2.4 10*3/uL (ref 1.5–6.5)
NEUT%: 75.9 % — AB (ref 39.0–75.0)
Platelets: 186 10*3/uL (ref 140–400)
RBC: 3.43 10*6/uL — ABNORMAL LOW (ref 4.20–5.82)
RDW: 22.4 % — ABNORMAL HIGH (ref 11.0–14.6)
WBC: 3.1 10*3/uL — ABNORMAL LOW (ref 4.0–10.3)

## 2015-03-28 NOTE — Progress Notes (Signed)
Richard Warner from [employer] left message to say co pay for xeloda is 188.93. I called her back at [phone] and left her a message to see if can get cheaper for patient.

## 2015-03-29 ENCOUNTER — Ambulatory Visit
Admit: 2015-03-29 | Discharge: 2015-03-29 | Disposition: A | Discharge status: Home / Self Care | Payer: Medicare Other | Attending: Radiation Oncology | Admitting: Radiation Oncology

## 2015-03-29 DIAGNOSIS — Z510 Encounter for antineoplastic radiation therapy: Secondary | ICD-10-CM | POA: Diagnosis not present

## 2015-03-29 LAB — CEA: CEA: 26.2 ng/mL — AB (ref 0.0–5.0)

## 2015-03-30 ENCOUNTER — Ambulatory Visit
Admit: 2015-03-30 | Discharge: 2015-03-30 | Disposition: A | Discharge status: Home / Self Care | Payer: Medicare Other | Attending: Radiation Oncology | Admitting: Radiation Oncology

## 2015-03-30 ENCOUNTER — Encounter: Payer: Self-pay | Admitting: Radiation Oncology

## 2015-03-30 DIAGNOSIS — Z510 Encounter for antineoplastic radiation therapy: Secondary | ICD-10-CM | POA: Diagnosis not present

## 2015-03-31 ENCOUNTER — Encounter: Payer: Self-pay | Admitting: Cardiology

## 2015-03-31 ENCOUNTER — Ambulatory Visit (INDEPENDENT_AMBULATORY_CARE_PROVIDER_SITE_OTHER): Payer: Medicare Other | Admitting: Cardiology

## 2015-03-31 VITALS — BP 145/89 | HR 83 | Ht 71.0 in | Wt 355.1 lb

## 2015-03-31 DIAGNOSIS — C20 Malignant neoplasm of rectum: Secondary | ICD-10-CM | POA: Diagnosis not present

## 2015-03-31 DIAGNOSIS — I214 Non-ST elevation (NSTEMI) myocardial infarction: Secondary | ICD-10-CM | POA: Diagnosis not present

## 2015-03-31 DIAGNOSIS — E785 Hyperlipidemia, unspecified: Secondary | ICD-10-CM

## 2015-03-31 DIAGNOSIS — I10 Essential (primary) hypertension: Secondary | ICD-10-CM

## 2015-03-31 DIAGNOSIS — I2119 ST elevation (STEMI) myocardial infarction involving other coronary artery of inferior wall: Secondary | ICD-10-CM

## 2015-03-31 DIAGNOSIS — I2510 Atherosclerotic heart disease of native coronary artery without angina pectoris: Secondary | ICD-10-CM

## 2015-03-31 DIAGNOSIS — I2111 ST elevation (STEMI) myocardial infarction involving right coronary artery: Secondary | ICD-10-CM

## 2015-03-31 DIAGNOSIS — R600 Localized edema: Secondary | ICD-10-CM

## 2015-03-31 MED ORDER — FUROSEMIDE 20 MG PO TABS: 20.0000 mg | ORAL_TABLET | Freq: Every day | ORAL | Status: DC

## 2015-03-31 NOTE — Progress Notes (Signed)
Cardiology Office Note   Date:  03/31/2015   ID:  Richard Warner, Richard Warner [birth year], MRN [id number]  PCP:  Rachell Cipro, MD  Cardiologist:  Dr. Burt Knack    Chief Complaint  Patient presents with  . Hospitalization Follow-up    CAD with stent in July      History of Present Illness: Richard Warner is a 64 y.o. male who presents for hospital follow up - He has a PMHx of morbid obesity, hypertension, pulmonary embolus was on warfarin, rectal cancer, and now with bladder cancer.  He was in [facility] in July for STEMI -He was found to have total occulusion of the 1st OM and underwent successful PCI + DES. He also had residual moderate diffuse 3V CAD to be treated medically.  On follow up 03/11/15 was weak, very pale and weak.  Was admitted.  He was on triple therapy for new stent and hx of DVT and PE.  With severe anemia  Hgb of 4.6 and significant hematuria the asa and coumadin were stopped. Plavix was continued for new Stent.    Urology and oncology followed and pt began radiation.  He just completed 10 treatments and is to follow up in [place] on Monday.  Lab through oncology H/H 9.8/30.3 which has been stable.  No chest pain no increase of his SOB. He does have lower ext edema but improved from the hospital, there is open area with serrous drainage on rt lower leg.  BP is elevated.  NO hematuria.    Sleep Study had been discussed in hospital but pt feels overwhelmed at this point and would like to hold off.   He has had bradycardia in the past and some NSVT in hospital  Today walking in feeling well.  No chest pain no SOB.  Taking medication appropriately.   Past Medical History  Diagnosis Date  . Hypertension   . Pulmonary embolism 12-03-11    01-17-10 s/p DVT left leg -Coumadin tx.until 08-28-11  . Hyperlipidemia   . Leg swelling 12-03-11    bilateral if up most of day,equally swells, up to knee level, improved now  . Fractures 12-03-11    s/p right ankle and right wrist- no  problems now  . Hypothyroidism   . Dyslipidemia   . Anemia 12-03-11    tx. oral iron supplement  . Arthritis 12-03-11    mild lower back  . 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 Chronic kidney disease 12-03-11    kidney stone x1 ,none recent stage III  . Secondary hyperparathyroidism, renal   . Chronic anticoagulation   . Shortness of breath 12-03-11    hx. 6'11- during pulmonary emboli episode, none now, extreme exertion only.  . Morbid obesity   . Vitamin D deficiency   . Chronic pulmonary edema   . Dorsalgia   . Acute MI, true posterior wall, initial episode of care 02/27/2015    Past Surgical History  Procedure Laterality Date  . Colonoscopy  10/08/2011    Procedure: COLONOSCOPY;  Surgeon: Inda Castle, MD;  Location: [facility];  Service: Endoscopy;  Laterality: N/A;  . Tonsillectomy  12-03-11    child  . Proctoscopy  12/14/2011    Procedure: PROCTOSCOPY;  Surgeon: [name], MD;  Location: [facility] ORS;  Service: General;  Laterality: N/A;  . Low anterior bowel resection  12/14/2011    Mid/proximal rectal cancer. T3 N0  . Colonoscopy N/A 05/15/2013    Procedure: COLONOSCOPY;  Surgeon: Inda Castle, MD;  Location: [facility];  Service: Endoscopy;  Laterality: N/A;  . Colonoscopy N/A 07/27/2014    Procedure: COLONOSCOPY;  Surgeon: Inda Castle, MD;  Location: [facility];  Service: Endoscopy;  Laterality: N/A;  . Cyst removal trunk N/A 09/17/2014    Procedure: REMOVAL OF ANTERIOR CHEST WALL SKIN MASS;  Surgeon: Michael Boston, MD;  Location: [facility];  Service: General;  Laterality: N/A;  . Cystoscopy with biopsy N/A 12/03/2014    Procedure: CYSTOSCOPY WITH BIOPSY;  Surgeon: Lowella Bandy, MD;  Location: [facility];  Service: Urology;  Laterality: N/A;  . Eus N/A 01/06/2015    Procedure: LOWER ENDOSCOPIC  ULTRASOUND (EUS);  Surgeon: [name], MD;  Location: [facility];  Service: Endoscopy;  Laterality: N/A;  . Transurethral resection of bladder tumor N/A 02/23/2015    Procedure: TRANSURETHRAL RESECTION OF BLADDER MASS;  Surgeon: Lowella Bandy, MD;  Location: [facility];  Service: Urology;  Laterality: N/A;  . Cystoscopy N/A 02/23/2015    Procedure: CYSTOSCOPY;  Surgeon: Lowella Bandy, MD;  Location: [facility];  Service: Urology;  Laterality: N/A;  . Cardiac catheterization N/A 02/27/2015    Procedure: Coronary Stent Intervention;  Surgeon: [name], MD;  Location: [place] [facility];  Service: Cardiovascular;  Laterality: N/A;     Current Outpatient Prescriptions  Medication Sig Dispense Refill  . atorvastatin (LIPITOR) 80 MG tablet Take 1 tablet (80 mg total) by mouth daily at 6 PM. (Patient taking differently: Take 80 mg by mouth daily after supper. ) 30 tablet 5  . Cholecalciferol (VITAMIN D3) 5000 UNITS TABS Take 5,000 Units by mouth daily after supper.     . clopidogrel (PLAVIX) 75 MG tablet Take 1 tablet (75 mg total) by mouth daily with breakfast. (Patient taking differently: Take 75 mg by mouth daily before breakfast. ) 30 tablet 5  . doxazosin (CARDURA) 2 MG tablet Take 2 mg by mouth daily after supper.     . ezetimibe (ZETIA) 10 MG tablet Take 10 mg by mouth daily after supper.     . ferrous sulfate 325 (65 FE) MG tablet Take 1 tablet (325 mg total) by mouth 3 (three) times daily with meals. 90 tablet 3  . levothyroxine (SYNTHROID, LEVOTHROID) 88 MCG tablet Take 88 mcg by mouth daily before breakfast.    . metoprolol tartrate (LOPRESSOR) 25 MG tablet Take 0.5 tablets (12.5 mg total) by mouth 2 (two) times daily. 60 tablet 5  . nitroGLYCERIN (NITROSTAT) 0.4 MG SL tablet Place 1 tablet (0.4 mg total) under the tongue every 5 (five) minutes as needed for chest pain. 25 tablet 2  . furosemide (LASIX) 20 MG tablet Take 1 tablet (20 mg total) by mouth daily. Take 1 tablet by mouth daily for 1  week then decrease to 1/2 tablet daily 90 tablet 3   No current facility-administered medications for this visit.    Allergies:   Review of patient's allergies indicates no known allergies.    Social History:  The patient  reports that he has never smoked. He has never used smokeless tobacco. He reports that he does not drink alcohol or use illicit drugs.   Family History:  The patient's family history includes Cancer in his paternal grandfather; Diabetes in his father; Heart disease in his father; Hypertension in his paternal grandfather; Lung cancer in his paternal grandfather; Stroke in his  maternal grandfather and maternal grandmother.    ROS:  General:no colds or fevers, no weight changes Skin:no rashes or ulcers HEENT:no blurred vision, no congestion CV:see HPI PUL:see HPI GI:no diarrhea constipation or melena, no indigestion GU:no hematuria, no dysuria MS:no joint pain, no claudication Neuro:no syncope, no lightheadedness Endo:no diabetes, no thyroid disease  Wt Readings from Last 3 Encounters:  03/31/15 355 lb 1.9 oz (161.081 kg)  03/25/15 362 lb 1.6 oz (164.247 kg)  03/24/15 359 lb 9.6 oz (163.113 kg)     PHYSICAL EXAM: VS:  BP 145/89 mmHg  Pulse 83  Ht 5\' 11"  (1.803 m)  Wt 355 lb 1.9 oz (161.081 kg)  BMI 49.55 kg/m2 , BMI Body mass index is 49.55 kg/(m^2). General:Pleasant affect, NAD Skin:Warm and dry, brisk capillary refill HEENT:normocephalic, sclera clear, mucus membranes moist Neck:supple, no JVD, no bruits  Heart:S1S2 RRR without murmur, gallup, rub or click Lungs:clear without rales, rhonchi, or wheezes AYT:KZSW, non tender, + BS, do not palpate liver spleen or masses Ext:no lower ext edema, 2+ pedal pulses, 2+ radial pulses Neuro:alert and oriented, MAE, follows commands, + facial symmetry    EKG:  EKG is ordered today. The ekg ordered today demonstrates SR no acute changes.    Recent Labs: 03/11/2015: B Natriuretic Peptide 58.2; TSH  2.528 03/17/2015: ALT 19 03/18/2015: Magnesium 2.1 03/24/2015: BUN 14.8; Creatinine 1.5*; Potassium 4.4; Sodium 142 03/28/2015: HGB 9.8*; Platelets 186    Lipid Panel    Component Value Date/Time   CHOL 107 02/28/2015 0216   TRIG 114 02/28/2015 0216   HDL 29* 02/28/2015 0216   CHOLHDL 3.7 02/28/2015 0216   VLDL 23 02/28/2015 0216   LDLCALC 55 02/28/2015 0216       Other studies Reviewed: Additional studies/ records that were reviewed today include: hospital notes, and previous cath.  Previous office notes..   ASSESSMENT AND PLAN:  1. Gross hematuria with acute blood loss anemia requiring transfusion support- now without gross hematuria.  2 acute blood loss anemia- Hgb stable followed by oncology most recent 9.8  3. CAD with recent post STEMI, PCI with Synergy DES to first OM.  Residual CAD treat medically.  With recent acute GI bleed we stopped ASA and warfarin.  He continues with Plavix. Would like to resume ASA but will wait until after [facility] appt.  No chest pain.  4. DVT/ PE in past now off coumadin and during recent hospitalization felt safe to keep off,  Pt encouraged to move not be sedentary.   5.   Rectal and Bladder cancer.  Completed radiation, to be seen at [facility] next week.   6. Lower ext edema previously on lasix,  At 10 mg daily.  will resume today initially at 20 mg daily for 1 week then to 10 mg daily. BMP, Hepatic, and lipids next week.  7. Hyperlipidemia check lipids, on zetia and lipitor 80.  Keep follow up with Dr. Burt Knack in Oct.  8. OSA  Sleep study when pt stronger.    Following problems followed by PCP.    Hypothyroidism   Obesity, Class III, BMI 40-49.9 (morbid obesity)   RENAL DISEASE, CHRONIC, STAGE III    Chronic pulmonary embolism     We will check BMP with next lab draw. Current medicines are reviewed with the patient today.  The patient Has no concerns regarding medicines.  The following changes have been made:  See  above Labs/ tests ordered today include:see above  Disposition:   FU:  see above  Lennie Muckle, NP  03/31/2015 10:26 AM    [facility] [place], [address] [place], [place] Phone: [phone]; Fax: [phone]

## 2015-03-31 NOTE — Patient Instructions (Signed)
Medication Instructions:  Your physician has recommended you make the following change in your medication:  1.  Start Lasix 20 mg take 1 tablet daily for 1 week then decrease to 1/2 tablet daily.   Labwork: Your physician recommends that you have lab work in: 1 week with Dr. Benay Spice BMP HEPATIC LIPID   Testing/Procedures: None ordered  Follow-Up: Your physician recommends that you keep your f/u appt with Dr. Burt Knack on 05/23/2015.   Any Other Special Instructions Will Be Listed Below (If Applicable).

## 2015-04-02 ENCOUNTER — Encounter: Payer: Self-pay | Admitting: Cardiology

## 2015-04-02 NOTE — Addendum Note (Signed)
Addended by: Isaiah Serge on: 04/02/2015 09:52 PM   Modules accepted: Level of Service

## 2015-04-06 ENCOUNTER — Ambulatory Visit (HOSPITAL_BASED_OUTPATIENT_CLINIC_OR_DEPARTMENT_OTHER): Payer: Medicare Other | Admitting: Oncology

## 2015-04-06 ENCOUNTER — Other Ambulatory Visit (HOSPITAL_BASED_OUTPATIENT_CLINIC_OR_DEPARTMENT_OTHER): Payer: Medicare Other

## 2015-04-06 ENCOUNTER — Telehealth: Payer: Self-pay | Admitting: Oncology

## 2015-04-06 VITALS — BP 163/83 | HR 102 | Temp 98.8°F | Resp 20 | Ht 71.0 in | Wt 351.2 lb

## 2015-04-06 DIAGNOSIS — R310 Gross hematuria: Secondary | ICD-10-CM

## 2015-04-06 DIAGNOSIS — C20 Malignant neoplasm of rectum: Secondary | ICD-10-CM

## 2015-04-06 DIAGNOSIS — D6959 Other secondary thrombocytopenia: Secondary | ICD-10-CM

## 2015-04-06 DIAGNOSIS — D62 Acute posthemorrhagic anemia: Secondary | ICD-10-CM

## 2015-04-06 DIAGNOSIS — C7911 Secondary malignant neoplasm of bladder: Secondary | ICD-10-CM

## 2015-04-06 LAB — CBC WITH DIFFERENTIAL/PLATELET
BASO%: 0.5 % (ref 0.0–2.0)
Basophils Absolute: 0 10*3/uL (ref 0.0–0.1)
EOS%: 2.4 % (ref 0.0–7.0)
Eosinophils Absolute: 0.1 10*3/uL (ref 0.0–0.5)
HCT: 37.3 % — ABNORMAL LOW (ref 38.4–49.9)
HEMOGLOBIN: 12.3 g/dL — AB (ref 13.0–17.1)
LYMPH%: 14.3 % (ref 14.0–49.0)
MCH: 29.8 pg (ref 27.2–33.4)
MCHC: 33 g/dL (ref 32.0–36.0)
MCV: 90.5 fL (ref 79.3–98.0)
MONO#: 0.3 10*3/uL (ref 0.1–0.9)
MONO%: 10 % (ref 0.0–14.0)
NEUT%: 72.8 % (ref 39.0–75.0)
NEUTROS ABS: 2.3 10*3/uL (ref 1.5–6.5)
Platelets: 172 10*3/uL (ref 140–400)
RBC: 4.12 10*6/uL — ABNORMAL LOW (ref 4.20–5.82)
RDW: 22.3 % — AB (ref 11.0–14.6)
WBC: 3.2 10*3/uL — AB (ref 4.0–10.3)
lymph#: 0.5 10*3/uL — ABNORMAL LOW (ref 0.9–3.3)

## 2015-04-06 NOTE — Progress Notes (Signed)
[place] OFFICE PROGRESS NOTE   Diagnosis: Rectal cancer  INTERVAL HISTORY:   Richard Warner reports 1 episode of hematuria over the past few weeks. He feels well. No chest pain. He was seen in urology at [facility] last week and is scheduled for CT scans and surgical oncology/radiation appointments later this week.  Objective:  Vital signs in last 24 hours:  Blood pressure 163/83, pulse 102, temperature 98.8 F (37.1 C), temperature source Oral, resp. rate 20, height 5\' 11"  (1.803 m), weight 351 lb 3.2 oz (159.303 kg), SpO2 99 %.  Resp: Lungs clear bilaterally Cardio: Regular rate and rhythm GI: Nontender, obese, no mass Vascular: Chronic stasis change at the low leg bilaterally   Lab Results:  Lab Results  Component Value Date   WBC 3.2* 04/06/2015   HGB 12.3* 04/06/2015   HCT 37.3* 04/06/2015   MCV 90.5 04/06/2015   PLT 172 04/06/2015   NEUTROABS 2.3 04/06/2015     Lab Results  Component Value Date   CEA 26.2* 03/28/2015    Imaging:  No results found.  Medications: I have reviewed the patient's current medications.  Assessment/Plan: 1. Rectal cancer, stage II (pT3 pN0) status post low anterior resection 12/14/2011. The tumor was noted to be at 10 cm on a rigid proctoscopy at the time of surgery. He completed radiation and Xeloda from 01/31/2012-03/11/2012. He began a first cycle of adjuvant Xeloda on 04/22/2012. He completed cycle 5 beginning 07/17/2012. He completed cycle 6 beginning 08/08/2012.  Surveillance colonoscopy 05/15/2013, status post removal of tubular adenomas. Next colonoscopy 3 years.  Elevated CEA 06/18/2014  PET scan 06/30/2014 with mild focal hypermetabolism at the anorectal junction favored to be physiologic in the absence of associated CT abnormality; irregular bladder wall thickening.  Colonoscopy 07/27/2014 with removal of a sessile ascending colon polyp-tubular adenoma, no other significant findings  PET scan 12/27/2014:  status post low anterior resection. No evidence of hypermetabolic recurrent or metastatic disease. Similar hypermetabolism about the anus.  Lower endoscopic ultrasound 01/06/2015 with findings of nonspecific 4-5 mm thickening of the rectal wall just distal to the LAR anastomosis. Fine-needle aspiration was negative for malignant cells.  Evaluated by Dr. Janice Norrie for microscopic hematuria including a cystoscopic biopsy of an erythematous/edematous area of the bladder on 12/03/2014-negative for malignancy  Repeat cystoscopy confirmed a 3 cm anterior bladder mass, status post cystoscopic resection 02/23/2015 with the pathology confirming metastatic colorectal adenocarcinoma  Initiation of palliative radiation to the bladder and concurrent capecitabine on 03/17/2015. He completed 3 days of Xeloda and completed radiation 03/30/2015 2. History of iron deficiency anemia likely secondary to rectal bleeding.  3. 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 He is maintained on Coumadin.  4. Renal insufficiency.  5. Hypertension. 6. Chronic mild thrombocytopenia 7. Hypothyroid. 8. Microscopic hematuria on a urinalysis 07/02/2014  Evaluated by Dr. Janice Norrie including a cystoscopic biopsy of an erythematous/edematous area of the bladder on 12/03/2014-negative for malignancy  Repeat cystoscopy confirmed a 3 cm anterior bladder mass, status post cystoscopic resection 02/23/2015 with the pathology confirming metastatic colorectal adenocarcinoma  Initiation of palliative radiation to the bladder and concurrent capecitabine on 03/17/2015 9. Right anterior chest wall skin lesion/mass status post excision 09/20/2014. 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 Margins not involved. No evidence of angiolymphatic invasion or perineural invasion. 10. Gross hematuria, intermittent since bladder biopsy 12/03/2014.  11. Admission 02/27/2015 with an acute myocardial  infarction, status post a PCI and DES of first marginal lesion 12. Admission 03/11/2015 with  severe symptomatic anemia secondary to gross hematuria-improved    Disposition:  Richard Warner completed palliative radiation to the bladder with marked improvement in the gross hematuria. The anemia is significantly improved. He will continue iron.  He is undergoing an evaluation at [facility] to consider resection of the bladder lesion. He will return for an office visit here in 2-3 weeks. We are available to deliver systemic therapy if he is not a surgical candidate or if chemotherapy is recommended while he awaits cardiac clearance.  Betsy Coder, MD  04/06/2015  1:09 PM

## 2015-04-06 NOTE — Telephone Encounter (Signed)
Gave patient avs report and appointments for September.  °

## 2015-04-08 ENCOUNTER — Telehealth: Payer: Self-pay | Admitting: *Deleted

## 2015-04-08 NOTE — Telephone Encounter (Signed)
On 04-08-15 fax medical records to [facility] radiation oncology dept , it was consult notes, end of tx notes, sim & planning notes,  Richard Warner will do there part .

## 2015-04-12 ENCOUNTER — Telehealth: Payer: Self-pay

## 2015-04-12 MED ORDER — ASPIRIN EC 81 MG PO TBEC: 81.0000 mg | DELAYED_RELEASE_TABLET | Freq: Every day | ORAL | Status: AC

## 2015-04-12 NOTE — Progress Notes (Signed)
  Radiation Oncology         [phone]) [phone] ________________________________  Name: Richard Warner MRN: [id number]  Date: 03/30/2015  DOB: 01-18-1951  End of Treatment Note  Diagnosis:     ICD-9-CM ICD-10-CM    1. 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  15 MV X-rays were treated to the tumor plus a small margin  Narrative: The patient tolerated radiation treatment relatively well.   His hematuria resolved.  Plan: The patient has completed radiation treatment. The patient will return to radiation oncology clinic for routine followup in one month. I advised him to call or return sooner if he has any questions or concerns related to his recovery or treatment. ________________________________  Sheral Apley. Tammi Klippel, M.D.

## 2015-04-12 NOTE — Telephone Encounter (Signed)
Patient called back. Informed patient of notes below. Patient verbalized understanding.

## 2015-04-12 NOTE — Telephone Encounter (Signed)
Left message for patient to call back.      Please have Richard Warner resume his ASA 81 mg daily, along with plavix. cleared with Dr. Benay Spice.         ----- Message -----     From: Ladell Pier, MD     Sent: 04/07/2015  8:51 PM      To: Isaiah Serge, NP        Should be ok for ASA now    ----- Message -----     From: Isaiah Serge, NP     Sent: 04/02/2015  3:54 PM      To: Ladell Pier, MD        We would like to resume ASA when you feel he is stable

## 2015-04-25 ENCOUNTER — Other Ambulatory Visit (HOSPITAL_BASED_OUTPATIENT_CLINIC_OR_DEPARTMENT_OTHER): Payer: Medicare Other

## 2015-04-25 ENCOUNTER — Ambulatory Visit (HOSPITAL_BASED_OUTPATIENT_CLINIC_OR_DEPARTMENT_OTHER): Payer: Medicare Other | Admitting: Nurse Practitioner

## 2015-04-25 ENCOUNTER — Telehealth: Payer: Self-pay | Admitting: Oncology

## 2015-04-25 VITALS — BP 148/94 | HR 95 | Temp 98.2°F | Resp 18 | Ht 71.0 in | Wt 355.7 lb

## 2015-04-25 DIAGNOSIS — C20 Malignant neoplasm of rectum: Secondary | ICD-10-CM

## 2015-04-25 LAB — CBC WITH DIFFERENTIAL/PLATELET
BASO%: 0.5 % (ref 0.0–2.0)
Basophils Absolute: 0 10*3/uL (ref 0.0–0.1)
EOS%: 2.1 % (ref 0.0–7.0)
Eosinophils Absolute: 0.1 10*3/uL (ref 0.0–0.5)
HEMATOCRIT: 41.8 % (ref 38.4–49.9)
HEMOGLOBIN: 14 g/dL (ref 13.0–17.1)
LYMPH#: 0.5 10*3/uL — AB (ref 0.9–3.3)
LYMPH%: 12.6 % — ABNORMAL LOW (ref 14.0–49.0)
MCH: 30.3 pg (ref 27.2–33.4)
MCHC: 33.5 g/dL (ref 32.0–36.0)
MCV: 90.5 fL (ref 79.3–98.0)
MONO#: 0.4 10*3/uL (ref 0.1–0.9)
MONO%: 8.8 % (ref 0.0–14.0)
NEUT%: 76 % — ABNORMAL HIGH (ref 39.0–75.0)
NEUTROS ABS: 3.1 10*3/uL (ref 1.5–6.5)
Platelets: 142 10*3/uL (ref 140–400)
RBC: 4.62 10*6/uL (ref 4.20–5.82)
RDW: 16.9 % — AB (ref 11.0–14.6)
WBC: 4.1 10*3/uL (ref 4.0–10.3)

## 2015-04-25 NOTE — Telephone Encounter (Signed)
per pof to sch pt appt-gave pt copy of avs °

## 2015-04-25 NOTE — Progress Notes (Addendum)
[facility] OFFICE PROGRESS NOTE   Diagnosis:  Rectal cancer  INTERVAL HISTORY:   Richard Warner returns as scheduled. He denies hematuria. Strength is better. He has a good appetite. No pain. No shortness of breath or chest pain.  Objective:  Vital signs in last 24 hours:  Blood pressure 148/94, pulse 95, temperature 98.2 F (36.8 C), temperature source Oral, resp. rate 18, height 5\' 11"  (1.803 m), weight 355 lb 11.2 oz (161.344 kg), SpO2 94 %.    HEENT: No thrush or ulcers. Resp: Lungs clear bilaterally. Cardio: Regular rate and rhythm. GI: Obese. No obvious organomegaly. Vascular: Chronic stasis changes at the lower legs bilaterally.   Lab Results:  Lab Results  Component Value Date   WBC 4.1 04/25/2015   HGB 14.0 04/25/2015   HCT 41.8 04/25/2015   MCV 90.5 04/25/2015   PLT 142 04/25/2015   NEUTROABS 3.1 04/25/2015   CEA pending  Imaging:  No results found.  Medications: I have reviewed the patient's current medications.  Assessment/Plan: 1. Rectal cancer, stage II (pT3 pN0) status post low anterior resection 12/14/2011. The tumor was noted to be at 10 cm on a rigid proctoscopy at the time of surgery. He completed radiation and Xeloda from 01/31/2012-03/11/2012. He began a first cycle of adjuvant Xeloda on 04/22/2012. He completed cycle 5 beginning 07/17/2012. He completed cycle 6 beginning 08/08/2012.  Surveillance colonoscopy 05/15/2013, status post removal of tubular adenomas. Next colonoscopy 3 years.  Elevated CEA 06/18/2014  PET scan 06/30/2014 with mild focal hypermetabolism at the anorectal junction favored to be physiologic in the absence of associated CT abnormality; irregular bladder wall thickening.  Colonoscopy 07/27/2014 with removal of a sessile ascending colon polyp-tubular adenoma, no other significant findings  PET scan 12/27/2014: status post low anterior resection. No evidence of hypermetabolic recurrent or metastatic  disease. Similar hypermetabolism about the anus.  Lower endoscopic ultrasound 01/06/2015 with findings of nonspecific 4-5 mm thickening of the rectal wall just distal to the LAR anastomosis. Fine-needle aspiration was negative for malignant cells.  Evaluated by Dr. Janice Norrie for microscopic hematuria including a cystoscopic biopsy of an erythematous/edematous area of the bladder on 12/03/2014-negative for malignancy  Repeat cystoscopy confirmed a 3 cm anterior bladder mass, status post cystoscopic resection 02/23/2015 with the pathology confirming metastatic colorectal adenocarcinoma  Initiation of palliative radiation to the bladder and concurrent capecitabine on 03/17/2015. He completed 3 days of Xeloda and completed radiation 03/30/2015 2. History of iron deficiency anemia likely secondary to rectal bleeding.  3. 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 He is maintained on Coumadin.  4. Renal insufficiency.  5. Hypertension. 6. Chronic mild thrombocytopenia 7. Hypothyroid. 8. Microscopic hematuria on a urinalysis 07/02/2014  Evaluated by Dr. Janice Norrie including a cystoscopic biopsy of an erythematous/edematous area of the bladder on 12/03/2014-negative for malignancy  Repeat cystoscopy confirmed a 3 cm anterior bladder mass, status post cystoscopic resection 02/23/2015 with the pathology confirming metastatic colorectal adenocarcinoma  Initiation of palliative radiation to the bladder and concurrent capecitabine on 03/17/2015 9. Right anterior chest wall skin lesion/mass status post excision 09/20/2014. 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 Margins not involved. No evidence of angiolymphatic invasion or perineural invasion. 10. Gross hematuria, intermittent since bladder biopsy 12/03/2014.  11. Admission 02/27/2015 with an acute myocardial infarction, status post a PCI and DES of first marginal lesion 12. Admission 03/11/2015 with  severe symptomatic anemia secondary to gross hematuria.      Disposition: Mr. Auletta appears stable. He is no  longer experiencing hematuria and the hemoglobin has normalized. We will follow-up on the CEA from today.  He has seen Dr. Cloyd Stagers at [facility]. Per the office note, once they have spoken with cardiology an OR date will be coordinated with Dr. Sherral Hammers for partial cystectomy versus pelvic exenteration.  We will contact Dr. Cloyd Stagers this week to follow-up on the final plan.  We scheduled a return visit here in 2 months. We will adjust the appointment accordingly pending surgery plans.  Patient seen with Dr. Benay Spice.   Ned Card ANP/GNP-BC   04/25/2015  2:26 PM  This was a shared visit with Ned Card. The anemia has resolved. We will follow-up on the CEA from today. I will contact Dr. Cloyd Stagers to discuss the timing of surgery and indication for systemic therapy.  Julieanne Manson, M.D.

## 2015-04-26 ENCOUNTER — Telehealth (HOSPITAL_COMMUNITY): Payer: Self-pay | Admitting: Cardiac Rehabilitation

## 2015-04-26 LAB — CEA: CEA: 21.3 ng/mL — AB (ref 0.0–5.0)

## 2015-04-26 NOTE — Telephone Encounter (Signed)
pc to pt to enroll in cardiac rehab.  Pt is awaiting bladder tumor surgery.  He is not interested in participating in cardiac rehab at this time.

## 2015-04-27 ENCOUNTER — Telehealth: Payer: Self-pay | Admitting: *Deleted

## 2015-04-27 NOTE — Telephone Encounter (Signed)
Left message on voicemail for pt to call office for lab results.

## 2015-04-27 NOTE — Telephone Encounter (Signed)
-----   Message from Ladell Pier, MD sent at 04/26/2015  5:07 PM EDT ----- Please call patient, Richard Warner still mildly elevated, f/u as scheduled

## 2015-04-29 ENCOUNTER — Telehealth: Payer: Self-pay

## 2015-04-29 NOTE — Telephone Encounter (Signed)
-----   Message from Domenic Schwab, RN sent at 04/29/2015  3:34 PM EDT -----   ----- Message -----    From: Ladell Pier, MD    Sent: 04/26/2015   5:07 PM      To: Adalberto Cole, RN, Brien Few, RN, #  Please call patient, cea still mildly elevated, f/u as scheduled

## 2015-04-29 NOTE — Telephone Encounter (Signed)
Called and informed pt of cea results and to f/u as scheduled. Pt verbalized understanding and denies any questions or concerns at this time.

## 2015-05-05 ENCOUNTER — Ambulatory Visit
Admission: RE | Admit: 2015-05-05 | Discharge: 2015-05-05 | Disposition: A | Discharge status: Home / Self Care | Payer: Medicare Other | Source: Ambulatory Visit | Attending: Radiation Oncology | Admitting: Radiation Oncology

## 2015-05-05 ENCOUNTER — Encounter: Payer: Self-pay | Admitting: Radiation Oncology

## 2015-05-05 VITALS — BP 142/84 | HR 106 | Resp 16 | Wt 358.3 lb

## 2015-05-05 DIAGNOSIS — C7911 Secondary malignant neoplasm of bladder: Secondary | ICD-10-CM

## 2015-05-05 NOTE — Progress Notes (Addendum)
Radiation Oncology         [phone] ________________________________  Name: Richard Warner MRN: [id number]  Date: 05/05/2015  DOB: 07/31/1951    Follow-Up Visit Note  CC: Rachell Cipro, MD  Leandrew Koyanagi, MD  Diagnosis:   64 yo man with recurrence of rectal cancer involving the bladder    ICD-9-CM ICD-10-CM   1. 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  Weight and vitals stable. Denies dysuria. Reports nocturia x 1 if at all. Reports hematuria has resolved. Denies dysuria. Denies diarrhea. Abrasion noted right lower leg. Patient applying neosporin to affected area. Reports energy level is improving. Denies pain. Scheduled to follow up with cardiologist 05/23/15. Seen by "kidney doctor and PCP recently" and obtained a good report. He is feeling much better today.                          ALLERGIES:  has No Known Allergies.  Meds: Current Outpatient Prescriptions  Medication Sig Dispense Refill  . aspirin EC 81 MG tablet Take 1 tablet (81 mg total) by mouth daily. 90 tablet 3  . atorvastatin (LIPITOR) 80 MG tablet Take 1 tablet (80 mg total) by mouth daily at 6 PM. (Patient taking differently: Take 80 mg by mouth daily after supper. ) 30 tablet 5  . Cholecalciferol (VITAMIN D3) 5000 UNITS TABS Take 5,000 Units by mouth daily after supper.     . clopidogrel (PLAVIX) 75 MG tablet Take 1 tablet (75 mg total) by mouth daily with breakfast. (Patient taking differently: Take 75 mg by mouth daily before breakfast. ) 30 tablet 5  . doxazosin (CARDURA) 2 MG tablet Take 2 mg by mouth daily after supper.     . ezetimibe (ZETIA) 10 MG tablet Take 10 mg by mouth daily after supper.     . ferrous sulfate 325 (65 FE) MG tablet Take 1 tablet (325 mg total) by mouth 3 (three) times daily with meals. 90 tablet 3  . furosemide (LASIX) 20 MG tablet Take 1 tablet (20 mg total) by mouth  daily. Take 1 tablet by mouth daily for 1 week then decrease to 1/2 tablet daily 90 tablet 3  . levothyroxine (SYNTHROID, LEVOTHROID) 88 MCG tablet Take 88 mcg by mouth daily before breakfast.    . metoprolol tartrate (LOPRESSOR) 25 MG tablet Take 0.5 tablets (12.5 mg total) by mouth 2 (two) times daily. 60 tablet 5  . nitroGLYCERIN (NITROSTAT) 0.4 MG SL tablet Place 1 tablet (0.4 mg total) under the tongue every 5 (five) minutes as needed for chest pain. 25 tablet 2   No current facility-administered medications for this encounter.    Physical Findings: The patient is in no acute distress. Patient is alert and oriented.  weight is 358 lb 4.8 oz (162.524 kg). His blood pressure is 142/84 and his pulse is 106. His respiration is 16 and oxygen saturation is 100%. .  No significant changes.  Impression:  The patient is recovering from the effects of radiation. He has not had any recurrence of presenting hematuria following palliative radiation. His bladder metastasis appears to represent an isolated solitary oligometastatic deposit, warranting aggressive curative treatment.  Plan:  The patient plans to undergo exploratory surgery with partial cystectomy vs radical cystectomy at [facility]. We will be available to follow up after surgery to discuss whether further radiation treatment  is indicated/needed, locally or on [facility] at [facility].  This document serves as a record of services personally performed by Tyler Pita, MD. It was created on his behalf by Arlyce Harman, a trained medical scribe. The creation of this record is based on the scribe's personal observations and the provider's statements to them. This document has been checked and approved by the attending provider.     _____________________________________  Sheral Apley. Tammi Klippel, M.D.

## 2015-05-05 NOTE — Progress Notes (Addendum)
Weight and vitals stable. Denies dysuria. Reports nocturia x 1 if at all. Reports hematuria has resolved. Denies dysuria. Denies diarrhea. Abrasion noted right lower leg. Patient applying neosporin to affected area. Reports energy level is improving. Denies pain. Scheduled to follow up with cardiologist 05/23/15. Seen by "kidney doctor and PCP recently" and obtained a good report.  BP 142/84 mmHg  Pulse 106  Resp 16  Wt 358 lb 4.8 oz (162.524 kg)  SpO2 100% Wt Readings from Last 3 Encounters:  05/05/15 358 lb 4.8 oz (162.524 kg)  04/25/15 355 lb 11.2 oz (161.344 kg)  04/06/15 351 lb 3.2 oz (159.303 kg)

## 2015-05-19 ENCOUNTER — Ambulatory Visit: Payer: Medicare Other | Admitting: Cardiovascular Disease

## 2015-05-22 NOTE — Progress Notes (Signed)
Cardiology Office Note Date:  05/23/2015   ID:  Richard Warner, DOB Feb 20, 1951, MRN [id number]  PCP:  Rachell Cipro, MD  Cardiologist:  [name], MD    Chief Complaint  Patient presents with  . Shortness of Breath    History of Present Illness: Richard Warner is a 64 y.o. male who presents for preoperative evaluation. He presented with a NSTEMI in July 2016, treated with Primary PCI of the OM branch of the left circumflex which was acutely occluded. Noted to have moderate residual 3 vessel CAD. He was initially discharged home on triple therapy with ASA, plavix, and warfarin, but presented with marked weakness and pallor, noted to have HgB of 4.6. Warfarin was stopped, Aspirin was held but now restarted, and plavix has been continued throughout. He denies further hematuria or rectal bleeding. He's been noted to have colon cancer with a single site of metastasis to the bladder. He's undergone radiation therapy and plans on undergone cystectomy at [facility] next month if cleared.  From a cardiac perspective he is doing relatively well. No chest pain. Anginal equivalent was throat, jaw, tooth pain, and headache. This has not recurred. He has mild dyspnea with exertion but has been able to walk > 200 yards without much trouble. No orthopnea, or PND.   Past Medical History  Diagnosis Date  . Hypertension   . Pulmonary embolism ([place]) 12-03-11    01-17-10 s/p DVT left leg -Coumadin tx.until 08-28-11  . Hyperlipidemia   . Leg swelling 12-03-11    bilateral if up most of day,equally swells, up to knee level, improved now  . Fractures 12-03-11    s/p right ankle and right wrist- no problems now  . Hypothyroidism   . Dyslipidemia   . Anemia 12-03-11    tx. oral iron supplement  . Arthritis 12-03-11    mild lower back  . Malignant neoplasm of sigmoid (flexure) ([place]) 10/08/2011  . Colon cancer ([place]) 12/14/11    Low anterior resection of mid/proximal tumor=invasive adenocarcinoma,invaing  through the muscularis propria into pericolonic fatty tissue  . Chronic kidney disease 12-03-11    kidney stone x1 ,none recent stage III  . Secondary hyperparathyroidism, renal ([place])   . Chronic anticoagulation   . Shortness of breath 12-03-11    hx. 6'11- during pulmonary emboli episode, none now, extreme exertion only.  . Morbid obesity ([place])   . Vitamin D deficiency   . Chronic pulmonary edema   . Dorsalgia   . Acute MI, true posterior wall, initial episode of care [facility]) 02/27/2015    Past Surgical History  Procedure Laterality Date  . Colonoscopy  10/08/2011    Procedure: COLONOSCOPY;  Surgeon: Inda Castle, MD;  Location: [facility] ENDOSCOPY;  Service: Endoscopy;  Laterality: N/A;  . Tonsillectomy  12-03-11    child  . Proctoscopy  12/14/2011    Procedure: PROCTOSCOPY;  Surgeon: [name], MD;  Location: [facility] ORS;  Service: General;  Laterality: N/A;  . Low anterior bowel resection  12/14/2011    Mid/proximal rectal cancer. T3 N0  . Colonoscopy N/A 05/15/2013    Procedure: COLONOSCOPY;  Surgeon: Inda Castle, MD;  Location: [facility]OSCOPY;  Service: Endoscopy;  Laterality: N/A;  . Colonoscopy N/A 07/27/2014    Procedure: COLONOSCOPY;  Surgeon: Inda Castle, MD;  Location: [facility] ENDOSCOPY;  Service: Endoscopy;  Laterality: N/A;  . Cyst removal trunk N/A 09/17/2014    Procedure: REMOVAL OF ANTERIOR CHEST WALL SKIN MASS;  Surgeon: River Mckercher Boston, MD;  Location: [facility] ORS;  Service: General;  Laterality: N/A;  . Cystoscopy with biopsy N/A 12/03/2014    Procedure: CYSTOSCOPY WITH BIOPSY;  Surgeon: Lowella Bandy, MD;  Location: [facility] ORS;  Service: Urology;  Laterality: N/A;  . Eus N/A 01/06/2015    Procedure: LOWER ENDOSCOPIC ULTRASOUND (EUS);  Surgeon: [name], MD;  Location: [facility];  Service: Endoscopy;  Laterality: N/A;  . Transurethral resection of bladder tumor N/A 02/23/2015    Procedure: TRANSURETHRAL RESECTION OF BLADDER MASS;  Surgeon: Lowella Bandy, MD;  Location: [facility];  Service:  Urology;  Laterality: N/A;  . Cystoscopy N/A 02/23/2015    Procedure: CYSTOSCOPY;  Surgeon: Lowella Bandy, MD;  Location: [facility];  Service: Urology;  Laterality: N/A;  . Cardiac catheterization N/A 02/27/2015    Procedure: Coronary Stent Intervention;  Surgeon: [name], MD;  Location: [facility];  Service: Cardiovascular;  Laterality: N/A;    Current Outpatient Prescriptions  Medication Sig Dispense Refill  . aspirin EC 81 MG tablet Take 1 tablet (81 mg total) by mouth daily. 90 tablet 3  . atorvastatin (LIPITOR) 80 MG tablet Take 1 tablet (80 mg total) by mouth daily at 6 PM. 30 tablet 5  . Cholecalciferol (VITAMIN D3) 5000 UNITS TABS Take 5,000 Units by mouth daily after supper.     . clopidogrel (PLAVIX) 75 MG tablet Take 1 tablet (75 mg total) by mouth daily with breakfast. 30 tablet 5  . doxazosin (CARDURA) 2 MG tablet Take 2 mg by mouth daily after supper.     . ezetimibe (ZETIA) 10 MG tablet Take 10 mg by mouth daily after supper.     . ferrous sulfate 325 (65 FE) MG tablet Take 1 tablet (325 mg total) by mouth 3 (three) times daily with meals. 90 tablet 3  . furosemide (LASIX) 20 MG tablet Take 10 mg by mouth daily.    [name] levothyroxine (SYNTHROID, LEVOTHROID) 88 MCG tablet Take 88 mcg by mouth daily before breakfast.    . metoprolol tartrate (LOPRESSOR) 25 MG tablet Take 1 tablet (25 mg total) by mouth 2 (two) times daily. 60 tablet 8  . nitroGLYCERIN (NITROSTAT) 0.4 MG SL tablet Place 0.4 mg under the tongue every 5 (five) minutes as needed for chest pain (3 doses MAX).     No current facility-administered medications for this visit.    Allergies:   Review of patient's allergies indicates no known allergies.   Social History:  The patient  reports that he has never smoked. He has never used smokeless tobacco. He reports that he does not drink alcohol or use illicit drugs.   Family History:  The patient's  family history includes Cancer in his paternal grandfather;  Diabetes in his father; Heart disease in his father; Hypertension in his paternal grandfather; Lung cancer in his paternal grandfather; Stroke in his maternal grandfather and maternal grandmother.   ROS:  Please see the history of present illness.  Otherwise, review of systems is positive for appetite change, leg swelling, DOE.  All other systems are reviewed and negative.   PHYSICAL EXAM: VS:  BP 140/80 mmHg  Pulse 94  Ht 5\' 11"  (1.803 m)  Wt 353 lb (160.12 kg)  BMI 49.26 kg/m2 , BMI Body mass index is 49.26 kg/(m^2). GEN: Well nourished, well developed, in no acute distress HEENT: normal Neck: no JVD, no masses. No carotid bruits Cardiac: RRR without murmur or gallop  Respiratory:  clear to auscultation bilaterally, normal work of breathing GI: soft, nontender, nondistended, + BS MS: no deformity or atrophy Ext: 1+ edema with chronic stasis changes, pedal pulses 2+= bilaterally Skin: warm and dry, stasis changes noted Neuro:  Strength and sensation are intact Psych: euthymic mood, full affect  EKG:  EKG is not ordered today.  Recent Labs: 03/11/2015: B Natriuretic Peptide 58.2; TSH 2.528 03/17/2015: ALT 19 03/18/2015: Magnesium 2.1 03/24/2015: BUN 14.8; Creatinine 1.5*; Potassium 4.4; Sodium 142 04/25/2015: HGB 14.0; Platelets 142   Lipid Panel     Component Value Date/Time   CHOL 107 02/28/2015 0216   TRIG 114 02/28/2015 0216   HDL 29* 02/28/2015 0216   CHOLHDL 3.7 02/28/2015 0216   VLDL 23 02/28/2015 0216   LDLCALC 55 02/28/2015 0216      Wt Readings from Last 3 Encounters:  05/23/15 353 lb (160.12 kg)  05/05/15 358 lb 4.8 oz (162.524 kg)  04/25/15 355 lb 11.2 oz (161.344 kg)     Cardiac Studies Reviewed: 2D Echo 7.26.2016: Study Conclusions  - Left ventricle: The cavity size was normal. Wall thickness was normal. Systolic function was normal. The estimated ejection fraction was in the range of 50% to 55%. Features are consistent with a  pseudonormal left ventricular filling pattern, with concomitant abnormal relaxation and increased filling pressure (grade 2 diastolic dysfunction). - Left atrium: The atrium was mildly dilated.  Impressions:  - The echo is extremely poor quality. Accurate assessment of the valves cannot be made by this echo. The LV function can be seen to be fairly normal using definity contrast.  ASSESSMENT AND PLAN: 1.  CAD, native vessel: Acute MI 3 months ago. He was treated with a Synergy DES (bioabsorbable polymer). Theoretically this stent type endothelializes more rapidly (within 3 months) and would have a lowr risk of stent thrombosis. He requires surgery for an isolated metastasis and certainly a long delay in surgery is not in his best interest. He is having no angina and can achieve greater than 4 metabolic equivalents. I think it is reasonable to proceed with surgery with the following recommendations:  Hold plavix 5 days prior, resume ASAP after surgery  Increase metoprolol to 25 mg BID and do not interrupt beta-blocker perioperatively  Continue ASA 81 mg perioperatively without interruption if possible  2. Hyperlipidemia: continue high-intensity statin  3. DVT/PE: off of warfarin after recent major bleed  Current medicines are reviewed with the patient today.  The patient does not have concerns regarding medicines.  Labs/ tests ordered today include:   Orders Placed This Encounter  Procedures  . Lipid panel  . Hepatic function panel    Disposition:   FU 6 months  Signed, [name], MD  05/23/2015 1:17 PM    [facility] [address], [place], [place]  [address] Phone: [phone]; Fax: [phone]

## 2015-05-23 ENCOUNTER — Ambulatory Visit (INDEPENDENT_AMBULATORY_CARE_PROVIDER_SITE_OTHER): Payer: Medicare Other | Admitting: Cardiovascular Disease

## 2015-05-23 ENCOUNTER — Encounter: Payer: Self-pay | Admitting: Cardiovascular Disease

## 2015-05-23 VITALS — BP 140/80 | HR 94 | Ht 71.0 in | Wt 353.0 lb

## 2015-05-23 DIAGNOSIS — I2510 Atherosclerotic heart disease of native coronary artery without angina pectoris: Secondary | ICD-10-CM | POA: Diagnosis not present

## 2015-05-23 DIAGNOSIS — E785 Hyperlipidemia, unspecified: Secondary | ICD-10-CM | POA: Diagnosis not present

## 2015-05-23 LAB — HEPATIC FUNCTION PANEL
ALBUMIN: 4 g/dL (ref 3.6–5.1)
ALK PHOS: 83 U/L (ref 40–115)
ALT: 27 U/L (ref 9–46)
AST: 24 U/L (ref 10–35)
BILIRUBIN INDIRECT: 0.5 mg/dL (ref 0.2–1.2)
Bilirubin, Direct: 0.2 mg/dL (ref <=0.2)
TOTAL PROTEIN: 7 g/dL (ref 6.1–8.1)
Total Bilirubin: 0.7 mg/dL (ref 0.2–1.2)

## 2015-05-23 LAB — LIPID PANEL
CHOLESTEROL: 102 mg/dL — AB (ref 125–200)
HDL: 25 mg/dL — AB (ref >=40)
LDL CALC: 48 mg/dL (ref <130)
TRIGLYCERIDES: 146 mg/dL (ref <150)
Total CHOL/HDL Ratio: 4.1 Ratio (ref <=5.0)
VLDL: 29 mg/dL (ref <30)

## 2015-05-23 MED ORDER — METOPROLOL TARTRATE 25 MG PO TABS: 25.0000 mg | ORAL_TABLET | Freq: Two times a day (BID) | ORAL | Status: AC

## 2015-05-23 NOTE — Patient Instructions (Signed)
Medication Instructions:  Your physician has recommended you make the following change in your medication:  1. INCREASE Metoprolol Tartrate to 25mg  take one tablet by mouth twice a day  Labwork: Your physician recommends that you have lab work today: LIPID and LIVER  Testing/Procedures: No new orders.   Follow-Up: Your physician wants you to follow-up in: 6 MONTHS with Dr Burt Knack.  You will receive a reminder letter in the mail two months in advance. If you don't receive a letter, please call our office to schedule the follow-up appointment.  You are cleared for surgery. You can hold Plavix 5 days prior to surgery and should resume ASAP when safe after surgery.  Do not stop Aspirin.   Any Other Special Instructions Will Be Listed Below (If Applicable).

## 2015-06-10 DIAGNOSIS — C799 Secondary malignant neoplasm of unspecified site: Secondary | ICD-10-CM

## 2015-06-10 DIAGNOSIS — Z9889 Other specified postprocedural states: Secondary | ICD-10-CM | POA: Insufficient documentation

## 2015-06-10 DIAGNOSIS — C20 Malignant neoplasm of rectum: Secondary | ICD-10-CM | POA: Insufficient documentation

## 2015-06-24 ENCOUNTER — Other Ambulatory Visit: Payer: Self-pay | Admitting: *Deleted

## 2015-06-24 ENCOUNTER — Telehealth: Payer: Self-pay | Admitting: *Deleted

## 2015-06-24 NOTE — Telephone Encounter (Signed)
Received call from mother Judeth Porch cancelling pt's appt for 06/28/15.  Spoke with Judeth Porch, and was informed that pt had surgery early this am , and is now in ICU on respirator at [facility].  Per Judeth Porch, pt had bowel leakage into abdomen, and had temp of 101.8 . Pt now has colostomy as well as bladder bag per Judeth Porch. Message sent to Dr. Benay Spice, and desk nurse today. Richard Warner's   Phone    [phone].

## 2015-06-24 NOTE — Telephone Encounter (Signed)
Reschedule for week of 12/5 Joanmarie Tsang or Lattie Haw

## 2015-06-25 DIAGNOSIS — S31109A Unspecified open wound of abdominal wall, unspecified quadrant without penetration into peritoneal cavity, initial encounter: Secondary | ICD-10-CM | POA: Insufficient documentation

## 2015-06-28 ENCOUNTER — Ambulatory Visit: Payer: Medicare Other | Admitting: Oncology

## 2015-06-28 ENCOUNTER — Telehealth: Payer: Self-pay | Admitting: Oncology

## 2015-06-28 NOTE — Telephone Encounter (Signed)
Called and left a message with follow up per pof,booked with gbs as lt is out

## 2015-07-08 ENCOUNTER — Other Ambulatory Visit: Payer: Self-pay | Admitting: *Deleted

## 2015-07-08 ENCOUNTER — Telehealth: Payer: Self-pay | Admitting: Oncology

## 2015-07-08 NOTE — Telephone Encounter (Signed)
Appointments cancelled per pof as patient in [place] at [facility]

## 2015-07-14 ENCOUNTER — Ambulatory Visit: Payer: Medicare Other | Admitting: Oncology

## 2015-07-14 ENCOUNTER — Other Ambulatory Visit: Payer: Medicare Other

## 2015-08-29 ENCOUNTER — Ambulatory Visit: Payer: Self-pay | Admitting: Cardiovascular Disease

## 2015-08-29 DIAGNOSIS — Z5181 Encounter for therapeutic drug level monitoring: Secondary | ICD-10-CM

## 2015-08-29 DIAGNOSIS — C20 Malignant neoplasm of rectum: Secondary | ICD-10-CM

## 2015-08-31 ENCOUNTER — Other Ambulatory Visit: Payer: Self-pay | Admitting: Nurse Practitioner

## 2015-09-09 ENCOUNTER — Telehealth: Payer: Self-pay | Admitting: Cardiovascular Disease

## 2015-09-09 NOTE — Telephone Encounter (Signed)
I spoke with the pt's mother and the pt has been an inpatient at [facility] for 3 months.  The pt had surgery for bladder CA and they punctured his colon.  The pt developed sepsis and continues to be treated with antibiotics. The pt has lost over 90 lbs and is on TPN.  The pt's mother is working with a Tourist information centre manager on trying to find placement for the pt in a rehab facility that will accept TPN.  She mainly contacted the office to make Dr Burt Knack aware of the pt's situation.

## 2015-09-09 NOTE — Telephone Encounter (Signed)
New message   Pt mother wants a rn to call her back

## 2015-09-10 NOTE — Telephone Encounter (Signed)
Thx. Sorry to hear he's had such a tough time.

## 2015-09-27 ENCOUNTER — Encounter: Payer: Self-pay | Admitting: Internal Medicine

## 2015-09-27 ENCOUNTER — Non-Acute Institutional Stay (SKILLED_NURSING_FACILITY): Payer: Medicare Other | Admitting: Internal Medicine

## 2015-09-27 DIAGNOSIS — E038 Other specified hypothyroidism: Secondary | ICD-10-CM

## 2015-09-27 DIAGNOSIS — N183 Chronic kidney disease, stage 3 unspecified: Secondary | ICD-10-CM

## 2015-09-27 DIAGNOSIS — I10 Essential (primary) hypertension: Secondary | ICD-10-CM | POA: Diagnosis not present

## 2015-09-27 DIAGNOSIS — S31109A Unspecified open wound of abdominal wall, unspecified quadrant without penetration into peritoneal cavity, initial encounter: Secondary | ICD-10-CM

## 2015-09-27 DIAGNOSIS — I2510 Atherosclerotic heart disease of native coronary artery without angina pectoris: Secondary | ICD-10-CM | POA: Diagnosis not present

## 2015-09-27 DIAGNOSIS — R5381 Other malaise: Secondary | ICD-10-CM | POA: Diagnosis not present

## 2015-09-27 DIAGNOSIS — E034 Atrophy of thyroid (acquired): Secondary | ICD-10-CM | POA: Diagnosis not present

## 2015-09-27 DIAGNOSIS — C20 Malignant neoplasm of rectum: Secondary | ICD-10-CM

## 2015-09-27 DIAGNOSIS — C799 Secondary malignant neoplasm of unspecified site: Secondary | ICD-10-CM | POA: Diagnosis not present

## 2015-09-27 NOTE — Progress Notes (Addendum)
Patient ID: Richard Warner, male   DOB: [birth year], 65 y.o.   MRN: [id number]    HISTORY AND PHYSICAL   DATE: 09/27/15  Location:  [facility]    Place of Service: SNF [phone])   Extended Emergency Contact Information Primary Emergency Contact: Mottley,Lillian Address: [address]          [place], [place] [address] [place] Phone: [phone] Mobile Phone: [phone] Relation: Mother Secondary Emergency Contact: Riki Altes Address: [address]          [place], [place] [address] [place] Phone: [phone] Relation: Daughter  Advanced Directive information  FULL CODE  Chief Complaint  Patient presents with  . 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 He is s/p ileal conduit, sigmoidectomy on 06/09/15. He had anastamosis failure and underwent ex-lap, end colostomy with layered abdominal closure on 11/22.  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 currently on TPN as he is NPO. He had refractory MRSA bacteremia and S. Aureus/E faecalis/P aerug UTI, failed tx with vanco. He was placed on dapto, ceftaro and cipro and all lines pulled. IV abx continued x 6 weeks and was completed prior to SNF admission  He has no c/o today. No f/c. No HA or dizziness. Feels weak. No CP or SOB. No nursing issues. No falls. He is NPO except meds.  CAD - s/p DES in 02/2015. He has hx MI. Takes lopressor, prn SLNTG, ASA, lipitor  Anemia - takes iron supplement  DM - A1c 6% in Nov 2016. He is on SSI  Hypothyroid - stable on synthroid  Hx PE and DVT - has been treated in the past. Currently on subcut Heparin  Hx rectal CA - 1st dx in 2013 and underwent local resection. Takes oxycodone prn pain.  Followed by oncology  Depression - takes remeron. Mood stable  Morbid obesity - currently sedentary  Past Medical History  Diagnosis Date  . Hypertension   . Pulmonary embolism ([place]) 12-03-11    01-17-10 s/p DVT left leg -Coumadin tx.until 08-28-11  . Hyperlipidemia   . Leg swelling 12-03-11    bilateral if up most of day,equally swells, up to knee level, improved now  . Fractures 12-03-11    s/p right ankle and right wrist- no problems now  . Hypothyroidism   . Dyslipidemia   . Anemia 12-03-11    tx. oral iron supplement  . Arthritis 12-03-11    mild lower back  . 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 Chronic kidney disease 12-03-11    kidney stone x1 ,none recent stage III  . Secondary hyperparathyroidism, renal ([place])   . Chronic anticoagulation   . Shortness of breath 12-03-11    hx. 6'11- during pulmonary emboli episode, none now, extreme exertion only.  . Morbid obesity ([place])   . Vitamin D deficiency   . Chronic pulmonary edema   . Dorsalgia   . Acute MI, true posterior wall, initial episode of care [facility]) 02/27/2015    Past Surgical History  Procedure Laterality Date  . Colonoscopy  10/08/2011    Procedure: COLONOSCOPY;  Surgeon: Inda Castle, MD;  Location: [facility];  Service: Endoscopy;  Laterality: N/A;  . Tonsillectomy  12-03-11    child  . Proctoscopy  12/14/2011    Procedure: PROCTOSCOPY;  Surgeon: [name], MD;  Location: [facility];  Service: General;  Laterality: N/A;  . Low anterior bowel resection  12/14/2011    Mid/proximal rectal cancer. T3 N0  . Colonoscopy N/A 05/15/2013    Procedure: COLONOSCOPY;  Surgeon: Inda Castle, MD;  Location: [facility];  Service: Endoscopy;  Laterality: N/A;  . Colonoscopy N/A 07/27/2014    Procedure: COLONOSCOPY;  Surgeon: Inda Castle, MD;  Location: [facility];  Service: Endoscopy;  Laterality: N/A;  . Cyst removal trunk N/A 09/17/2014    Procedure: REMOVAL OF ANTERIOR CHEST WALL SKIN MASS;  Surgeon: Michael Boston, MD;  Location: [facility];  Service: General;  Laterality: N/A;  . Cystoscopy with biopsy N/A 12/03/2014    Procedure: CYSTOSCOPY WITH BIOPSY;  Surgeon: Lowella Bandy, MD;  Location: [facility];  Service: Urology;  Laterality: N/A;  . Eus N/A 01/06/2015    Procedure: LOWER ENDOSCOPIC ULTRASOUND (EUS);  Surgeon: [name], MD;  Location: [facility];  Service: Endoscopy;  Laterality: N/A;  . Transurethral resection of bladder tumor N/A 02/23/2015    Procedure: TRANSURETHRAL RESECTION OF BLADDER MASS;  Surgeon: Lowella Bandy, MD;  Location: [facility];  Service: Urology;  Laterality: N/A;  . Cystoscopy N/A 02/23/2015    Procedure: CYSTOSCOPY;  Surgeon: Lowella Bandy, MD;  Location: [facility];  Service: Urology;  Laterality: N/A;  . Cardiac catheterization N/A 02/27/2015    Procedure: Coronary Stent Intervention;  Surgeon: [name], MD;  Location: [facility];  Service: Cardiovascular;  Laterality: N/A;    Patient Care Team: Fanny Bien, MD as PCP - General (Family Medicine) Inda Castle, MD as Consulting Physician (Gastroenterology) Tanda Rockers, MD as Consulting Physician (Pulmonary Disease) Lelon Perla, MD as Consulting Physician (Cardiology) Ladell Pier, MD as Consulting Physician (Medical Oncology) Kyung Rudd, MD as Consulting Physician (Radiation Oncology) Michael Boston, MD (General Surgery)  Social History   Social History  . Marital Status: Single    Spouse Name: N/A  . Number of Children: 1  . Years of Education: HS    Occupational History  . Disability     Social History Main Topics  . Smoking status: Never Smoker   . Smokeless tobacco: Never Used  . Alcohol Use: No  . Drug Use: No  . Sexual Activity: Not on file   Other Topics Concern  . Not on file   Social History  Narrative   Drink 2-3 Diet cokes a week      reports that he has never smoked. He has never used smokeless tobacco. He reports that he does not drink alcohol or use illicit drugs.  Family History  Problem Relation Age of Onset  . Diabetes Father   . Heart disease Father   . Stroke Maternal Grandmother   . Stroke Maternal Grandfather   . Hypertension Paternal Grandfather   . Lung cancer Paternal Grandfather   . Cancer Paternal Grandfather    Family Status  Relation Status Death Age  . Father Deceased     MI  . Mother Alive   . Maternal Grandmother Deceased   . Maternal Grandfather Deceased   . Paternal Grandfather Deceased     MI  . Paternal Grandmother Deceased  There is no immunization history on file for this patient.  No Known Allergies  Medications: Patient's Medications  New Prescriptions   No medications on file  Previous Medications   ASPIRIN EC 81 MG TABLET    Take 1 tablet (81 mg total) by mouth daily.   ATORVASTATIN (LIPITOR) 80 MG TABLET    Take 1 tablet (80 mg total) by mouth daily at 6 PM.   CHOLECALCIFEROL (VITAMIN D3) 5000 UNITS TABS    Take 5,000 Units by mouth daily after supper.    CLOPIDOGREL (PLAVIX) 75 MG TABLET    Take 1 tablet (75 mg total) by mouth daily with breakfast.   DOXAZOSIN (CARDURA) 2 MG TABLET    Take 2 mg by mouth daily after supper.    EZETIMIBE (ZETIA) 10 MG TABLET    Take 10 mg by mouth daily after supper.    FERROUS SULFATE 325 (65 FE) MG TABLET    Take 1 tablet (325 mg total) by mouth 3 (three) times daily with meals.   FUROSEMIDE (LASIX) 20 MG TABLET    Take 10 mg by mouth daily.   LEVOTHYROXINE (SYNTHROID, LEVOTHROID) 88 MCG TABLET    Take 88 mcg by mouth daily before breakfast.   METOPROLOL TARTRATE (LOPRESSOR) 25 MG TABLET    Take 1 tablet (25 mg total) by mouth 2 (two) times daily.   NITROGLYCERIN (NITROSTAT) 0.4 MG SL TABLET    Place 0.4 mg under the tongue every 5 (five) minutes as needed for chest pain (3 doses MAX).    Modified Medications   No medications on file  Discontinued Medications   No medications on file    Review of Systems  Unable to perform ROS: Other  short term memory loss  Filed Vitals:   09/27/15 1221  BP: 135/56  Pulse: 105  Temp: 98.5 F (36.9 C)  Weight: 295 lb 6.4 oz (133.993 kg)   Body mass index is 41.22 kg/(m^2).  Physical Exam  Constitutional: He appears well-developed and well-nourished.  HENT:  Mouth/Throat: Oropharynx is clear and moist.  Eyes: Pupils are equal, round, and reactive to light. No scleral icterus.  Neck: Neck supple. Carotid bruit is not present. No thyromegaly present.  Cardiovascular: Normal rate, regular rhythm, normal heart sounds and intact distal pulses.  Exam reveals no gallop and no friction rub.   No murmur heard. Trace LE edema b/l. No calf TTP. Right subclavian central line intact with no secondary signs of infection at insertion site  Pulmonary/Chest: Effort normal and breath sounds normal. He has no wheezes. He has no rales. He exhibits no tenderness.  Abdominal: Soft. Bowel sounds are normal. He exhibits no distension, no abdominal bruit, no pulsatile midline mass and no mass. There is tenderness. There is no rebound and no guarding.  Colostomy on left; urostomy on right; lower midline wound vac intact. No secondary signs of infection  Genitourinary:  Foley DTG from urostomy. No hematuria  Musculoskeletal: He exhibits edema.  Lymphadenopathy:    He has no cervical adenopathy.  Neurological: He is alert.  Skin: Skin is warm and dry. No rash noted.  Seborrhea dermatitis of scalp with yellow flakes. Chronic venous stasis changes of leg b/l  Psychiatric: He has a normal mood and affect. His behavior is normal.     Labs reviewed: No visits with results within 3 Month(s) from this visit. Latest known visit with results is:  Office Visit on 05/23/2015  Component Date Value Ref Range Status  . Cholesterol 05/23/2015  102* 125 - 200  mg/dL Final  . Triglycerides 05/23/2015 146  <150 mg/dL Final  . HDL 05/23/2015 25* >=40 mg/dL Final  . Total CHOL/HDL Ratio 05/23/2015 4.1  <=5.0 Ratio Final  . VLDL 05/23/2015 29  <30 mg/dL Final  . LDL Cholesterol 05/23/2015 48  <130 mg/dL Final   Comment:   Total Cholesterol/HDL Ratio:CHD Risk                        Coronary Heart Disease Risk Table                                        Men       Women          1/2 Average Risk              3.4        3.3              Average Risk              5.0        4.4           2X Average Risk              9.6        7.1           3X Average Risk             23.4       11.0 Use the calculated Patient Ratio above and the CHD Risk table  to determine the patient's CHD Risk.   . Total Bilirubin 05/23/2015 0.7  0.2 - 1.2 mg/dL Final  . Bilirubin, Direct 05/23/2015 0.2  <=0.2 mg/dL Final  . Indirect Bilirubin 05/23/2015 0.5  0.2 - 1.2 mg/dL Final  . Alkaline Phosphatase 05/23/2015 83  40 - 115 U/L Final  . AST 05/23/2015 24  10 - 35 U/L Final  . ALT 05/23/2015 27  9 - 46 U/L Final  . Total Protein 05/23/2015 7.0  6.1 - 8.1 g/dL Final  . Albumin 05/23/2015 4.0  3.6 - 5.1 g/dL Final    No results found.   Assessment/Plan   ICD-9-CM ICD-10-CM   1. Physical deconditioning 799.3 R53.81   2. Open wnd anterior abdomen, initial encounter 879.2 S31.109A   3. 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 Essential hypertension 401.9 I10   5. Hypothyroidism due to acquired atrophy of thyroid 244.8 E03.8    246.8 E03.4   6. CAD in native artery 414.01 I25.10   7. RENAL DISEASE, CHRONIC, STAGE III 585.3 N18.3     Cont current meds as ordered  TPN as ordered - dosed per pharmacy  Check weekly CMP, prealbumin  Cont CBGs TID and SSI as ordered  Wound vac care as ordered  Wound care to follow  Keep appt with specialists as scheduled  PT/OT/ST as ordered  GOAL: short term rehab with potential for long  term care. Communicated with pt and nursing.  Will follow  Josia Cueva S. Perlie Gold  [facility] [address] [place], [place] [address] [phone] Cell (Monday-Friday 8 AM - 5 PM) [phone] After 5 PM and follow prompts

## 2015-10-05 ENCOUNTER — Encounter (HOSPITAL_COMMUNITY): Payer: Self-pay | Admitting: Emergency Medicine

## 2015-10-05 ENCOUNTER — Inpatient Hospital Stay (HOSPITAL_COMMUNITY)
Admission: EM | Admit: 2015-10-05 | Discharge: 2015-10-08 | DRG: 872 | Disposition: A | Discharge status: Other Acute Inpatient Hospital | Payer: Medicare Other | Attending: Internal Medicine | Admitting: Internal Medicine

## 2015-10-05 DIAGNOSIS — N183 Chronic kidney disease, stage 3 unspecified: Secondary | ICD-10-CM | POA: Diagnosis present

## 2015-10-05 DIAGNOSIS — A4181 Sepsis due to Enterococcus: Secondary | ICD-10-CM | POA: Diagnosis not present

## 2015-10-05 DIAGNOSIS — R7881 Bacteremia: Secondary | ICD-10-CM

## 2015-10-05 DIAGNOSIS — E66813 Obesity, class 3: Secondary | ICD-10-CM | POA: Diagnosis present

## 2015-10-05 DIAGNOSIS — Z86711 Personal history of pulmonary embolism: Secondary | ICD-10-CM

## 2015-10-05 DIAGNOSIS — K632 Fistula of intestine: Secondary | ICD-10-CM | POA: Diagnosis present

## 2015-10-05 DIAGNOSIS — Z85048 Personal history of other malignant neoplasm of rectum, rectosigmoid junction, and anus: Secondary | ICD-10-CM

## 2015-10-05 DIAGNOSIS — G4733 Obstructive sleep apnea (adult) (pediatric): Secondary | ICD-10-CM | POA: Diagnosis present

## 2015-10-05 DIAGNOSIS — Z7901 Long term (current) use of anticoagulants: Secondary | ICD-10-CM

## 2015-10-05 DIAGNOSIS — Z933 Colostomy status: Secondary | ICD-10-CM

## 2015-10-05 DIAGNOSIS — Z66 Do not resuscitate: Secondary | ICD-10-CM | POA: Diagnosis present

## 2015-10-05 DIAGNOSIS — E785 Hyperlipidemia, unspecified: Secondary | ICD-10-CM | POA: Diagnosis present

## 2015-10-05 DIAGNOSIS — A419 Sepsis, unspecified organism: Secondary | ICD-10-CM | POA: Diagnosis not present

## 2015-10-05 DIAGNOSIS — Z6841 Body Mass Index (BMI) 40.0 and over, adult: Secondary | ICD-10-CM

## 2015-10-05 DIAGNOSIS — J811 Chronic pulmonary edema: Secondary | ICD-10-CM | POA: Diagnosis present

## 2015-10-05 DIAGNOSIS — I2510 Atherosclerotic heart disease of native coronary artery without angina pectoris: Secondary | ICD-10-CM | POA: Diagnosis present

## 2015-10-05 DIAGNOSIS — Z823 Family history of stroke: Secondary | ICD-10-CM

## 2015-10-05 DIAGNOSIS — Z794 Long term (current) use of insulin: Secondary | ICD-10-CM

## 2015-10-05 DIAGNOSIS — Z923 Personal history of irradiation: Secondary | ICD-10-CM

## 2015-10-05 DIAGNOSIS — Z906 Acquired absence of other parts of urinary tract: Secondary | ICD-10-CM

## 2015-10-05 DIAGNOSIS — Z833 Family history of diabetes mellitus: Secondary | ICD-10-CM

## 2015-10-05 DIAGNOSIS — I252 Old myocardial infarction: Secondary | ICD-10-CM

## 2015-10-05 DIAGNOSIS — C7911 Secondary malignant neoplasm of bladder: Secondary | ICD-10-CM | POA: Diagnosis present

## 2015-10-05 DIAGNOSIS — Z801 Family history of malignant neoplasm of trachea, bronchus and lung: Secondary | ICD-10-CM

## 2015-10-05 DIAGNOSIS — Z955 Presence of coronary angioplasty implant and graft: Secondary | ICD-10-CM

## 2015-10-05 DIAGNOSIS — R509 Fever, unspecified: Secondary | ICD-10-CM

## 2015-10-05 DIAGNOSIS — Z7982 Long term (current) use of aspirin: Secondary | ICD-10-CM

## 2015-10-05 DIAGNOSIS — I129 Hypertensive chronic kidney disease with stage 1 through stage 4 chronic kidney disease, or unspecified chronic kidney disease: Secondary | ICD-10-CM | POA: Diagnosis present

## 2015-10-05 DIAGNOSIS — S31109A Unspecified open wound of abdominal wall, unspecified quadrant without penetration into peritoneal cavity, initial encounter: Secondary | ICD-10-CM | POA: Diagnosis present

## 2015-10-05 DIAGNOSIS — I10 Essential (primary) hypertension: Secondary | ICD-10-CM | POA: Diagnosis present

## 2015-10-05 DIAGNOSIS — E039 Hypothyroidism, unspecified: Secondary | ICD-10-CM | POA: Diagnosis present

## 2015-10-05 DIAGNOSIS — N2581 Secondary hyperparathyroidism of renal origin: Secondary | ICD-10-CM | POA: Diagnosis present

## 2015-10-05 DIAGNOSIS — K625 Hemorrhage of anus and rectum: Secondary | ICD-10-CM | POA: Diagnosis present

## 2015-10-05 DIAGNOSIS — C20 Malignant neoplasm of rectum: Secondary | ICD-10-CM | POA: Diagnosis present

## 2015-10-05 DIAGNOSIS — Z9221 Personal history of antineoplastic chemotherapy: Secondary | ICD-10-CM

## 2015-10-05 DIAGNOSIS — Z8614 Personal history of Methicillin resistant Staphylococcus aureus infection: Secondary | ICD-10-CM

## 2015-10-05 DIAGNOSIS — D509 Iron deficiency anemia, unspecified: Secondary | ICD-10-CM | POA: Diagnosis present

## 2015-10-05 DIAGNOSIS — B952 Enterococcus as the cause of diseases classified elsewhere: Secondary | ICD-10-CM | POA: Diagnosis present

## 2015-10-05 DIAGNOSIS — Z8249 Family history of ischemic heart disease and other diseases of the circulatory system: Secondary | ICD-10-CM

## 2015-10-05 DIAGNOSIS — Z86718 Personal history of other venous thrombosis and embolism: Secondary | ICD-10-CM

## 2015-10-05 NOTE — ED Notes (Signed)
Pt arrives via EMS from [facility] [address] with c/o fever and more blood present in wound vac than normal. Pt has abd wound vac, TPN R chest via 2 port power port, urostomy with lots of sediment, colostomy with sediment. Pt alert but not following a lot of social cues/conversations. SNF reports temp 101.9 oral.

## 2015-10-05 NOTE — ED Provider Notes (Signed)
CSN: [id number]     Arrival date & time 10/05/15  2330 History  By signing my name below, I, Rowan Blase, attest that this documentation has been prepared under the direction and in the presence of Merryl Hacker, MD . Electronically Signed: Rowan Blase, Scribe. 10/06/2015. 3:18 AM.   Chief Complaint  Patient presents with  . Fever   The history is provided by the patient and the spouse. No language interpreter was used.   HPI Comments:  DONNIVAN KEKAHUNA is a 65 y.o. male with PMHx of HTN, pulmonary embolism, HLD, CAD, rectal cancer with prolonged hospital course and complications including diverting colonoscopy, urostomy, and abdominal wound VAC, abdominal fistula brought in by EMS who presents to the Emergency Department complaining of gradual onset fever tmax 101.9 from [facility]. TPN in right chest via 2 power port, urostomy and colostomy. EMS notes there is more blood present in the wound vac than normal, and sediment is present in urostomy and colostomy. Patient denies any infectious symptoms including cough, chest pain, shortness of breath. Denies any significant abdominal pain. Does report left leg pain. Per the patient's wife, she states that his leg was hurt during a transfer at [facility].  Past Medical History  Diagnosis Date  . Hypertension   . Pulmonary embolism ([place]) 12-03-11    01-17-10 s/p DVT left leg -Coumadin tx.until 08-28-11  . Hyperlipidemia   . Leg swelling 12-03-11    bilateral if up most of day,equally swells, up to knee level, improved now  . Fractures 12-03-11    s/p right ankle and right wrist- no problems now  . Hypothyroidism   . Dyslipidemia   . Anemia 12-03-11    tx. oral iron supplement  . Arthritis 12-03-11    mild lower back  . Malignant neoplasm of sigmoid (flexure) ([place]) 10/08/2011  . 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 Chronic kidney disease 12-03-11    kidney stone x1 ,none recent stage III  . Secondary hyperparathyroidism, renal ([place])   . Chronic anticoagulation   . Shortness of breath 12-03-11    hx. 6'11- during pulmonary emboli episode, none now, extreme exertion only.  . Morbid obesity ([place])   . Vitamin D deficiency   . Chronic pulmonary edema   . Dorsalgia   . Acute MI, true posterior wall, initial episode of care [facility]) 02/27/2015   Past Surgical History  Procedure Laterality Date  . Colonoscopy  10/08/2011    Procedure: COLONOSCOPY;  Surgeon: Inda Castle, MD;  Location: [facility] ENDOSCOPY;  Service: Endoscopy;  Laterality: N/A;  . Tonsillectomy  12-03-11    child  . Proctoscopy  12/14/2011    Procedure: PROCTOSCOPY;  Surgeon: [name], MD;  Location: [facility] ORS;  Service: General;  Laterality: N/A;  . Low anterior bowel resection  12/14/2011    Mid/proximal rectal cancer. T3 N0  . Colonoscopy N/A 05/15/2013    Procedure: COLONOSCOPY;  Surgeon: Inda Castle, MD;  Location: [facility];  Service: Endoscopy;  Laterality: N/A;  . Colonoscopy N/A 07/27/2014    Procedure: COLONOSCOPY;  Surgeon: Inda Castle, MD;  Location[facility];  Service: Endoscopy;  Laterality: N/A;  . Cyst removal trunk N/A 09/17/2014    Procedure: REMOVAL OF ANTERIOR CHEST WALL SKIN MASS;  Surgeon: Michael Boston, MD;  Location: [facility] ORS;  Service: General;  Laterality: N/A;  . Cystoscopy with biopsy  N/A 12/03/2014    Procedure: CYSTOSCOPY WITH BIOPSY;  Surgeon: Lowella Bandy, MD;  Location: [facility] ORS;  Service: Urology;  Laterality: N/A;  . Eus N/A 01/06/2015    Procedure: LOWER ENDOSCOPIC ULTRASOUND (EUS);  Surgeon: [name], MD;  Location: [facility];  Service: Endoscopy;  Laterality: N/A;  . Transurethral resection of bladder tumor N/A 02/23/2015    Procedure: TRANSURETHRAL RESECTION OF BLADDER MASS;  Surgeon: Lowella Bandy, MD;  Location: [facility];  Service: Urology;  Laterality: N/A;  . Cystoscopy N/A  02/23/2015    Procedure: CYSTOSCOPY;  Surgeon: Lowella Bandy, MD;  Location: [facility];  Service: Urology;  Laterality: N/A;  . Cardiac catheterization N/A 02/27/2015    Procedure: Coronary Stent Intervention;  Surgeon: [name], MD;  Location: [facility];  Service: Cardiovascular;  Laterality: N/A;   Family History  Problem Relation Age of Onset  . Diabetes Father   . Heart disease Father   . Stroke Maternal Grandmother   . Stroke Maternal Grandfather   . Hypertension Paternal Grandfather   . Lung cancer Paternal Grandfather   . Cancer Paternal Grandfather    Social History  Substance Use Topics  . Smoking status: Never Smoker   . Smokeless tobacco: Never Used  . Alcohol Use: No    Review of Systems  Constitutional: Positive for fever.  Respiratory: Negative for chest tightness and shortness of breath.   Cardiovascular: Negative for chest pain.  Gastrointestinal: Positive for blood in stool. Negative for vomiting, abdominal pain and diarrhea.  Musculoskeletal: Positive for myalgias.  All other systems reviewed and are negative.   Allergies  Review of patient's allergies indicates no known allergies.  Home Medications   Prior to Admission medications   Medication Sig Start Date End Date Taking? Authorizing Provider  aspirin EC 81 MG tablet Take 1 tablet (81 mg total) by mouth daily. 04/12/15  Yes Isaiah Serge, NP  atorvastatin (LIPITOR) 80 MG tablet Take 1 tablet (80 mg total) by mouth daily at 6 PM. 03/02/15  Yes Brittainy Erie Noe, PA-C  Cholecalciferol (VITAMIN D3) 5000 UNITS TABS Take 5,000 Units by mouth daily after supper.    Yes Historical Provider, MD  famotidine (PEPCID) 20 MG tablet Take 20 mg by mouth daily.   Yes Historical Provider, MD  ferrous sulfate 325 (65 FE) MG tablet Take 1 tablet (325 mg total) by mouth 3 (three) times daily with meals. Patient taking differently: Take 325 mg by mouth daily with breakfast.  03/19/15  Yes Christina P Rama, MD   heparin 1000 UNIT/ML injection Inject 750 Units into the vein every 8 (eight) hours.   Yes Historical Provider, MD  insulin regular (NOVOLIN R,HUMULIN R) 100 units/mL injection Inject 5 Units into the skin every 6 (six) hours as needed for high blood sugar (CBG is greater than 150).   Yes Historical Provider, MD  levothyroxine (SYNTHROID, LEVOTHROID) 88 MCG tablet Take 88 mcg by mouth daily before breakfast.   Yes Historical Provider, MD  metoprolol tartrate (LOPRESSOR) 25 MG tablet Take 1 tablet (25 mg total) by mouth 2 (two) times daily. Patient taking differently: Take 25 mg by mouth daily.  05/23/15  Yes [name], MD  mirtazapine (REMERON) 15 MG tablet Take 15 mg by mouth at bedtime.   Yes Historical Provider, MD  nitroGLYCERIN (NITROSTAT) 0.4 MG SL tablet Place 0.4 mg under the tongue every 5 (five) minutes as needed for chest pain (3 doses MAX).   Yes Historical Provider, MD  oxyCODONE (OXY IR/ROXICODONE) 5 MG immediate release tablet Take 5 mg by mouth every 4 (four) hours as needed for severe pain.   Yes Historical Provider, MD  tetrahydrozoline 0.05 % ophthalmic solution Place 2 drops into both eyes every 8 (eight) hours as needed (dry eyes).   Yes Historical Provider, MD   BP 111/62 mmHg  Pulse 110  Temp(Src) 101.3 F (38.5 C) (Rectal)  Resp 19  Ht 5\' 11"  (1.803 m)  Wt 295 lb 6.7 oz (134 kg)  BMI 41.22 kg/m2  SpO2 97% Physical Exam  Constitutional:  Morbidly obese, chronically ill-appearing  HENT:  Head: Normocephalic and atraumatic.  Eyes: Pupils are equal, round, and reactive to light.  Cardiovascular: Regular rhythm and normal heart sounds.   No murmur heard. Tachycardia  Pulmonary/Chest: Effort normal. No respiratory distress. He has rales.  Exam limited by body habitus Tunneled catheter right upper chest, no overlying erythema or tenderness  Abdominal: Soft. Bowel sounds are normal. There is no tenderness. There is no rebound.  Right lower quadrant urostomy with  extensive urinary sediment, left lower quadrant colostomy with scant output, wound VAC over the suprapubic midline region draining dark bloody fluid  Genitourinary: Penis normal.  No erythema or crepitus noted in the perineum  Musculoskeletal: He exhibits edema.  2+ bilateral lower extremity edema with chronic venous stasis changes  Neurological: He is alert.  Oriented to person and time, when asked place he states "with you."  Skin: Skin is warm and dry.  Psychiatric: He has a normal mood and affect.  Nursing note and vitals reviewed.   ED Course  Procedures   CRITICAL CARE Performed by: Merryl Hacker   Total critical care time: 65 minutes  Critical care time was exclusive of separately billable procedures and treating other patients.  Critical care was necessary to treat or prevent imminent or life-threatening deterioration.  Critical care was time spent personally by me on the following activities: development of treatment plan with patient and/or surrogate as well as nursing, discussions with consultants, evaluation of patient's response to treatment, examination of patient, obtaining history from patient or surrogate, ordering and performing treatments and interventions, ordering and review of laboratory studies, ordering and review of radiographic studies, pulse oximetry and re-evaluation of patient's condition.  DIAGNOSTIC STUDIES:  Oxygen Saturation is 98% on RA, normal by my interpretation.    COORDINATION OF CARE:  12:03 AM Will order lab work. Discussed treatment plan with pt at bedside and pt agreed to plan.  Labs Review Labs Reviewed  COMPREHENSIVE METABOLIC PANEL - Abnormal; Notable for the following:    Sodium 123 (*)    Potassium >7.5 (*)    Chloride 90 (*)    CO2 10 (*)    Glucose, Bld 2328 (*)    BUN 38 (*)    Creatinine, Ser 1.69 (*)    Calcium 6.9 (*)    Total Protein 4.4 (*)    Albumin 1.1 (*)    AST 52 (*)    Alkaline Phosphatase 147 (*)     GFR calc non Af Amer 41 (*)    GFR calc Af Amer 47 (*)    Anion gap 23 (*)    All other components within normal limits  CBC WITH DIFFERENTIAL/PLATELET - Abnormal; Notable for the following:    RBC 1.95 (*)    Hemoglobin 6.4 (*)    HCT 22.2 (*)    MCV 113.8 (*)    MCHC 28.8 (*)    RDW 17.4 (*)  All other components within normal limits  URINALYSIS, ROUTINE W REFLEX MICROSCOPIC (NOT AT [facility]) - Abnormal; Notable for the following:    APPearance TURBID (*)    pH 8.5 (*)    Hgb urine dipstick SMALL (*)    Protein, ur 30 (*)    Leukocytes, UA SMALL (*)    All other components within normal limits  URINE MICROSCOPIC-ADD ON - Abnormal; Notable for the following:    Squamous Epithelial / LPF 0-5 (*)    Bacteria, UA MANY (*)    Crystals TRIPLE PHOSPHATE CRYSTALS (*)    All other components within normal limits  COMPREHENSIVE METABOLIC PANEL - Abnormal; Notable for the following:    CO2 17 (*)    Glucose, Bld 183 (*)    BUN 48 (*)    Creatinine, Ser 1.77 (*)    Calcium 8.2 (*)    Total Protein 6.1 (*)    Albumin 1.5 (*)    AST 55 (*)    ALT 85 (*)    Alkaline Phosphatase 254 (*)    GFR calc non Af Amer 39 (*)    GFR calc Af Amer 45 (*)    All other components within normal limits  CBC WITH DIFFERENTIAL/PLATELET - Abnormal; Notable for the following:    RBC 2.66 (*)    Hemoglobin 7.3 (*)    HCT 23.9 (*)    RDW 16.1 (*)    Lymphs Abs 0.3 (*)    All other components within normal limits  POC OCCULT BLOOD, ED - Abnormal; Notable for the following:    Fecal Occult Bld POSITIVE (*)    All other components within normal limits  CBG MONITORING, ED - Abnormal; Notable for the following:    Glucose-Capillary 193 (*)    All other components within normal limits  CULTURE, BLOOD (ROUTINE X 2)  CULTURE, BLOOD (ROUTINE X 2)  URINE CULTURE  I-STAT CG4 LACTIC ACID, ED  I-STAT CG4 LACTIC ACID, ED  TYPE AND SCREEN  PREPARE RBC (CROSSMATCH)    Imaging Review Ct Abdomen Pelvis Wo  Contrast  10/06/2015  CLINICAL DATA:  Acute onset of generalized abdominal pain and fever. Blood noted within the abdominal wound vac. Initial encounter. EXAM: CT ABDOMEN AND PELVIS WITHOUT CONTRAST TECHNIQUE: Multidetector CT imaging of the abdomen and pelvis was performed following the standard protocol without IV contrast. COMPARISON:  CT of the abdomen and pelvis from 08/28/2011, and PET/CT performed 12/27/2014 FINDINGS: A trace right pleural effusion is noted. Bibasilar atelectasis is noted. Diffuse coronary artery calcifications are seen. The liver is unremarkable in appearance. The spleen is enlarged, measuring 15.4 cm in length. The gallbladder is mildly distended, containing a small stone and vicarious contrast excretion. It is otherwise grossly unremarkable. The pancreas and adrenal glands are unremarkable. The kidneys are unremarkable in appearance. There is no evidence of hydronephrosis. No renal or ureteral stones are seen. Minimal bilateral perinephric stranding is noted. The small bowel is unremarkable in appearance. The stomach is within normal limits. No acute vascular abnormalities are seen. Minimal calcification is noted along the abdominal aorta and its branches. The patient's left lower quadrant colostomy is decompressed and unremarkable in appearance. The right lower quadrant urostomy is also grossly unremarkable, aside from mild surrounding inflammation. The patient is status post cystectomy. Marked irregular soft tissue density is noted within the pelvis, surrounding a collection of complex fluid and trace air measuring approximately 6.9 x 4.5 x 5.5 cm. This appears contiguous with the inferior aspect of  the patient's anterior abdominal dehiscence. On clinical correlation, there appears to be leakage of liquid stool and blood from the abdominal wound, compatible with previously suspected fistulous connection to the adjacent small bowel, abscess formation, and likely surrounding recurrent  mass. Diffuse surrounding soft tissue inflammation is noted within the pelvis, tracking superiorly along the mesentery. The patient's tiny residual Hartmann's pouch is decompressed, though it may communicate with the complex pelvic abscess, given bleeding from the rectum. No inguinal lymphadenopathy is seen. A small left inguinal hernia is noted, containing only fat. No acute osseous abnormalities are identified. IMPRESSION: 1. Marked irregular soft tissue density within the pelvis, surrounding a collection of complex fluid and trace air measuring approximately 6.9 x 4.5 x 5.5 cm. This appears contiguous with the inferior aspect of the patient's anterior abdominal dehiscence. On clinical correlation, there is leakage of liquid stool and blood from the abdominal wound, compatible with previously suspected fistulous connection to the adjacent small bowel, abscess formation, and likely surrounding recurrent mass. 2. Diffuse surrounding soft tissue inflammation within the pelvis tracks superiorly along the mesentery. 3. Tiny residual Hartmann's pouch is decompressed, though it may communicate with the complex pelvic abscess, given bleeding from the rectum. 4. Small left inguinal hernia, containing only fat. 5. Trace right pleural effusion.  Bibasilar atelectasis noted. 6. Diffuse coronary artery calcifications seen. 7. Splenomegaly noted. 8. Cholelithiasis.  Gallbladder otherwise unremarkable. These results were called by telephone at the time of interpretation on 10/06/2015 at 5:07 am to Dr. Thayer Jew, who verbally acknowledged these results. Electronically Signed   By: Garald Balding M.D.   On: 10/06/2015 05:14   Dg Chest Portable 1 View  10/06/2015  CLINICAL DATA:  Fever for 2 days. History of pulmonary embolism, hypertension, shortness of breath. EXAM: PORTABLE CHEST 1 VIEW COMPARISON:  Chest radiograph February 27, 2015 FINDINGS: The cardiac silhouette appears moderately enlarged. Mediastinal silhouette is  nonsuspicious. Low inspiratory examination with bibasilar strandy densities. No pleural effusion. No pneumothorax. RIGHT tunnel probable catheter terminates at cavoatrial junction. Large body habitus. Osseous structures are nonsuspicious. Mild degenerative change of the thoracic spine. IMPRESSION: Similar cardiomegaly. Bibasilar atelectasis in this low inspiratory examination. Electronically Signed   By: Elon Alas M.D.   On: 10/06/2015 00:30   I have personally reviewed and evaluated these images and lab results as part of my medical decision-making.   EKG Interpretation   Date/Time:  Wednesday October 05 2015 23:58:09 EST Ventricular Rate:  139 PR Interval:  130 QRS Duration: 68 QT Interval:  283 QTC Calculation: 430 R Axis:   14 Text Interpretation:  Sinus tachycardia Low voltage, extremity and  precordial leads Abnormal R-wave progression, early transition Confirmed  by HORTON  MD, COURTNEY ([id number]) on 10/06/2015 3:57:22 AM      MDM   Final diagnoses:  Other specified fever  Rectal bleed  History of rectal cancer    Patient presents with fever. Temperature here rectally 101.3. Of note, on rectal exam, patient was noted to have grossly blood on temperature probe. Sepsis workup was initiated. Patient is tachycardic but not hypotensive. Initially given 1 L of fluids. Lactate is normal. He was given Tylenol and fluids for his tachycardia with improvement of his tachycardia into the 100s-110.  Patient was given empiric anabiotic including vancomycin, cefepime, and Zosyn to cover for abdominal pathology.  Culture sent. Based on my chart review, patient was discharged from a [facility] facility on 2/20. He had a prolonged hospitalization back in the fall with the surgical complications  listed above. His last known hemoglobin was 7.2. Hemoglobin here 7.3. However, given concern for sepsis, bright red blood per rectum, and known history of coronary artery disease, will transfuse 1 unit of blood.  His other lab work appears to be at his baseline.  Vital signs have improved with temperature management and fluid resuscitation. CT abdomen and pelvis without contrast ordered.  I called the [facility] transfer center as the patient prefers transfer to his surgical oncologist, Dr. Cloyd Stagers. Per [facility] transfer center, they are on medical and surgical diversion as well as ER diversion with the exception of trauma. They have no hospital beds. I did discuss the patient with Dr. Cloyd Stagers. He has accepted the patient upon bed availability. He recommends colonoscopy if patient has evidence of bleeding.  No obvious source of infection on CT. It's difficult to fully assess given patient's complex anatomy.  Will admit to SDU, Dr. Myna Hidalgo.  I personally performed the services described in this documentation, which was scribed in my presence. The recorded information has been reviewed and is accurate.     Merryl Hacker, MD 10/06/15 [phone]

## 2015-10-06 ENCOUNTER — Emergency Department (HOSPITAL_COMMUNITY): Payer: Medicare Other

## 2015-10-06 ENCOUNTER — Encounter (HOSPITAL_COMMUNITY): Payer: Self-pay | Admitting: Radiology

## 2015-10-06 DIAGNOSIS — D509 Iron deficiency anemia, unspecified: Secondary | ICD-10-CM | POA: Diagnosis not present

## 2015-10-06 DIAGNOSIS — Z8614 Personal history of Methicillin resistant Staphylococcus aureus infection: Secondary | ICD-10-CM | POA: Diagnosis not present

## 2015-10-06 DIAGNOSIS — Z923 Personal history of irradiation: Secondary | ICD-10-CM | POA: Diagnosis not present

## 2015-10-06 DIAGNOSIS — A419 Sepsis, unspecified organism: Secondary | ICD-10-CM | POA: Diagnosis present

## 2015-10-06 DIAGNOSIS — K632 Fistula of intestine: Secondary | ICD-10-CM | POA: Diagnosis present

## 2015-10-06 DIAGNOSIS — Z8249 Family history of ischemic heart disease and other diseases of the circulatory system: Secondary | ICD-10-CM | POA: Diagnosis not present

## 2015-10-06 DIAGNOSIS — J811 Chronic pulmonary edema: Secondary | ICD-10-CM | POA: Diagnosis not present

## 2015-10-06 DIAGNOSIS — Z833 Family history of diabetes mellitus: Secondary | ICD-10-CM | POA: Diagnosis not present

## 2015-10-06 DIAGNOSIS — Z9221 Personal history of antineoplastic chemotherapy: Secondary | ICD-10-CM | POA: Diagnosis not present

## 2015-10-06 DIAGNOSIS — Z85048 Personal history of other malignant neoplasm of rectum, rectosigmoid junction, and anus: Secondary | ICD-10-CM | POA: Diagnosis not present

## 2015-10-06 DIAGNOSIS — Z86718 Personal history of other venous thrombosis and embolism: Secondary | ICD-10-CM | POA: Diagnosis not present

## 2015-10-06 DIAGNOSIS — Z7901 Long term (current) use of anticoagulants: Secondary | ICD-10-CM | POA: Diagnosis not present

## 2015-10-06 DIAGNOSIS — Z7982 Long term (current) use of aspirin: Secondary | ICD-10-CM | POA: Diagnosis not present

## 2015-10-06 DIAGNOSIS — Z794 Long term (current) use of insulin: Secondary | ICD-10-CM | POA: Diagnosis not present

## 2015-10-06 DIAGNOSIS — C20 Malignant neoplasm of rectum: Secondary | ICD-10-CM | POA: Diagnosis not present

## 2015-10-06 DIAGNOSIS — N2581 Secondary hyperparathyroidism of renal origin: Secondary | ICD-10-CM | POA: Diagnosis not present

## 2015-10-06 DIAGNOSIS — Z906 Acquired absence of other parts of urinary tract: Secondary | ICD-10-CM | POA: Diagnosis not present

## 2015-10-06 DIAGNOSIS — B952 Enterococcus as the cause of diseases classified elsewhere: Secondary | ICD-10-CM | POA: Diagnosis not present

## 2015-10-06 DIAGNOSIS — N183 Chronic kidney disease, stage 3 (moderate): Secondary | ICD-10-CM | POA: Diagnosis not present

## 2015-10-06 DIAGNOSIS — E039 Hypothyroidism, unspecified: Secondary | ICD-10-CM | POA: Diagnosis not present

## 2015-10-06 DIAGNOSIS — A4181 Sepsis due to Enterococcus: Secondary | ICD-10-CM | POA: Diagnosis present

## 2015-10-06 DIAGNOSIS — Z66 Do not resuscitate: Secondary | ICD-10-CM | POA: Diagnosis not present

## 2015-10-06 DIAGNOSIS — I10 Essential (primary) hypertension: Secondary | ICD-10-CM | POA: Diagnosis not present

## 2015-10-06 DIAGNOSIS — Z955 Presence of coronary angioplasty implant and graft: Secondary | ICD-10-CM | POA: Diagnosis not present

## 2015-10-06 DIAGNOSIS — Z86711 Personal history of pulmonary embolism: Secondary | ICD-10-CM | POA: Diagnosis not present

## 2015-10-06 DIAGNOSIS — Z823 Family history of stroke: Secondary | ICD-10-CM | POA: Diagnosis not present

## 2015-10-06 DIAGNOSIS — Z801 Family history of malignant neoplasm of trachea, bronchus and lung: Secondary | ICD-10-CM | POA: Diagnosis not present

## 2015-10-06 DIAGNOSIS — Z933 Colostomy status: Secondary | ICD-10-CM | POA: Diagnosis not present

## 2015-10-06 DIAGNOSIS — I252 Old myocardial infarction: Secondary | ICD-10-CM | POA: Diagnosis not present

## 2015-10-06 DIAGNOSIS — I2510 Atherosclerotic heart disease of native coronary artery without angina pectoris: Secondary | ICD-10-CM | POA: Diagnosis not present

## 2015-10-06 DIAGNOSIS — R7881 Bacteremia: Secondary | ICD-10-CM | POA: Diagnosis not present

## 2015-10-06 DIAGNOSIS — Z6841 Body Mass Index (BMI) 40.0 and over, adult: Secondary | ICD-10-CM | POA: Diagnosis not present

## 2015-10-06 DIAGNOSIS — G4733 Obstructive sleep apnea (adult) (pediatric): Secondary | ICD-10-CM | POA: Diagnosis not present

## 2015-10-06 DIAGNOSIS — I129 Hypertensive chronic kidney disease with stage 1 through stage 4 chronic kidney disease, or unspecified chronic kidney disease: Secondary | ICD-10-CM | POA: Diagnosis not present

## 2015-10-06 DIAGNOSIS — C7911 Secondary malignant neoplasm of bladder: Secondary | ICD-10-CM | POA: Diagnosis not present

## 2015-10-06 DIAGNOSIS — K625 Hemorrhage of anus and rectum: Secondary | ICD-10-CM | POA: Diagnosis not present

## 2015-10-06 LAB — COMPREHENSIVE METABOLIC PANEL
ALK PHOS: 147 U/L — AB (ref 38–126)
ALT: 59 U/L (ref 17–63)
ALT: 63 U/L (ref 17–63)
ALT: 85 U/L — AB (ref 17–63)
ANION GAP: 11 (ref 5–15)
AST: 41 U/L (ref 15–41)
AST: 52 U/L — AB (ref 15–41)
AST: 55 U/L — ABNORMAL HIGH (ref 15–41)
Albumin: 1.1 g/dL — ABNORMAL LOW (ref 3.5–5.0)
Albumin: 1.5 g/dL — ABNORMAL LOW (ref 3.5–5.0)
Albumin: 1.3 g/dL — ABNORMAL LOW (ref 3.5–5.0)
Alkaline Phosphatase: 254 U/L — ABNORMAL HIGH (ref 38–126)
Alkaline Phosphatase: 187 U/L — ABNORMAL HIGH (ref 38–126)
Anion gap: 23 — ABNORMAL HIGH (ref 5–15)
Anion gap: 14 (ref 5–15)
BUN: 38 mg/dL — AB (ref 6–20)
BUN: 43 mg/dL — AB (ref 6–20)
BUN: 48 mg/dL — ABNORMAL HIGH (ref 6–20)
CALCIUM: 6.9 mg/dL — AB (ref 8.9–10.3)
CALCIUM: 8.2 mg/dL — AB (ref 8.9–10.3)
CHLORIDE: 103 mmol/L (ref 101–111)
CHLORIDE: 107 mmol/L (ref 101–111)
CHLORIDE: 90 mmol/L — AB (ref 101–111)
CO2: 10 mmol/L — AB (ref 22–32)
CO2: 14 mmol/L — AB (ref 22–32)
CO2: 17 mmol/L — AB (ref 22–32)
CREATININE: 1.69 mg/dL — AB (ref 0.61–1.24)
CREATININE: 1.7 mg/dL — AB (ref 0.61–1.24)
CREATININE: 1.77 mg/dL — AB (ref 0.61–1.24)
Calcium: 8.3 mg/dL — ABNORMAL LOW (ref 8.9–10.3)
GFR calc Af Amer: 47 mL/min — ABNORMAL LOW (ref >60)
GFR, EST AFRICAN AMERICAN: 45 mL/min — AB (ref >60)
GFR, EST AFRICAN AMERICAN: 47 mL/min — AB (ref >60)
GFR, EST NON AFRICAN AMERICAN: 39 mL/min — AB (ref >60)
GFR, EST NON AFRICAN AMERICAN: 41 mL/min — AB (ref >60)
GFR, EST NON AFRICAN AMERICAN: 41 mL/min — AB (ref >60)
Glucose, Bld: 2328 mg/dL (ref 65–99)
Glucose, Bld: 183 mg/dL — ABNORMAL HIGH (ref 65–99)
Glucose, Bld: 740 mg/dL (ref 65–99)
POTASSIUM: 5.5 mmol/L — AB (ref 3.5–5.1)
Potassium: >7.5 mmol/L (ref 3.5–5.1)
Potassium: 4.3 mmol/L (ref 3.5–5.1)
SODIUM: 123 mmol/L — AB (ref 135–145)
SODIUM: 135 mmol/L (ref 135–145)
Sodium: 131 mmol/L — ABNORMAL LOW (ref 135–145)
Total Bilirubin: 0.5 mg/dL (ref 0.3–1.2)
Total Bilirubin: 0.6 mg/dL (ref 0.3–1.2)
Total Bilirubin: 0.6 mg/dL (ref 0.3–1.2)
Total Protein: 4.4 g/dL — ABNORMAL LOW (ref 6.5–8.1)
Total Protein: 6.1 g/dL — ABNORMAL LOW (ref 6.5–8.1)
Total Protein: 5.4 g/dL — ABNORMAL LOW (ref 6.5–8.1)

## 2015-10-06 LAB — CBG MONITORING, ED
GLUCOSE-CAPILLARY: 140 mg/dL — AB (ref 65–99)
Glucose-Capillary: 193 mg/dL — ABNORMAL HIGH (ref 65–99)
Glucose-Capillary: 139 mg/dL — ABNORMAL HIGH (ref 65–99)

## 2015-10-06 LAB — CBC WITH DIFFERENTIAL/PLATELET
Basophils Absolute: 0 10*3/uL (ref 0.0–0.1)
Basophils Absolute: 0 10*3/uL (ref 0.0–0.1)
Basophils Relative: 0 %
Basophils Relative: 0 %
Eosinophils Absolute: 0 10*3/uL (ref 0.0–0.7)
Eosinophils Absolute: 0 10*3/uL (ref 0.0–0.7)
Eosinophils Relative: 1 %
Eosinophils Relative: 1 %
HCT: 22.2 % — ABNORMAL LOW (ref 39.0–52.0)
HCT: 23.4 % — ABNORMAL LOW (ref 39.0–52.0)
HEMATOCRIT: 23.9 % — AB (ref 39.0–52.0)
HEMOGLOBIN: 7.3 g/dL — AB (ref 13.0–17.0)
Hemoglobin: 6.4 g/dL — CL (ref 13.0–17.0)
Hemoglobin: 7.3 g/dL — ABNORMAL LOW (ref 13.0–17.0)
LYMPHS ABS: 0.3 10*3/uL — AB (ref 0.7–4.0)
Lymphocytes Relative: 8 %
Lymphocytes Relative: 17 %
Lymphs Abs: 0.5 10*3/uL — ABNORMAL LOW (ref 0.7–4.0)
MCH: 27.4 pg (ref 26.0–34.0)
MCH: 32.8 pg (ref 26.0–34.0)
MCH: 29.3 pg (ref 26.0–34.0)
MCHC: 28.8 g/dL — ABNORMAL LOW (ref 30.0–36.0)
MCHC: 30.5 g/dL (ref 30.0–36.0)
MCHC: 31.2 g/dL (ref 30.0–36.0)
MCV: 113.8 fL — ABNORMAL HIGH (ref 78.0–100.0)
MCV: 89.8 fL (ref 78.0–100.0)
MCV: 94 fL (ref 78.0–100.0)
MONOS PCT: 15 %
Monocytes Absolute: 0.6 10*3/uL (ref 0.1–1.0)
Monocytes Absolute: 0.2 10*3/uL (ref 0.1–1.0)
Monocytes Relative: 7 %
NEUTROS ABS: 3.1 10*3/uL (ref 1.7–7.7)
NEUTROS PCT: 77 %
Neutro Abs: 2.4 10*3/uL (ref 1.7–7.7)
Neutrophils Relative %: 75 %
Platelets: 226 10*3/uL (ref 150–400)
Platelets: 197 10*3/uL (ref 150–400)
RBC: 1.95 MIL/uL — ABNORMAL LOW (ref 4.22–5.81)
RBC: 2.66 MIL/uL — AB (ref 4.22–5.81)
RBC: 2.49 MIL/uL — ABNORMAL LOW (ref 4.22–5.81)
RDW: 16.1 % — ABNORMAL HIGH (ref 11.5–15.5)
RDW: 16.2 % — ABNORMAL HIGH (ref 11.5–15.5)
RDW: 17.4 % — AB (ref 11.5–15.5)
WBC: 4.1 10*3/uL (ref 4.0–10.5)
WBC: 3.2 10*3/uL — ABNORMAL LOW (ref 4.0–10.5)

## 2015-10-06 LAB — URINALYSIS, ROUTINE W REFLEX MICROSCOPIC
BILIRUBIN URINE: NEGATIVE
GLUCOSE, UA: NEGATIVE mg/dL
Ketones, ur: NEGATIVE mg/dL
Nitrite: NEGATIVE
Protein, ur: 30 mg/dL — AB
SPECIFIC GRAVITY, URINE: 1.012 (ref 1.005–1.030)
pH: 8.5 — ABNORMAL HIGH (ref 5.0–8.0)

## 2015-10-06 LAB — PROCALCITONIN: PROCALCITONIN: 0.33 ng/mL

## 2015-10-06 LAB — PREPARE RBC (CROSSMATCH)

## 2015-10-06 LAB — URINE MICROSCOPIC-ADD ON

## 2015-10-06 LAB — GLUCOSE, CAPILLARY
GLUCOSE-CAPILLARY: 108 mg/dL — AB (ref 65–99)
GLUCOSE-CAPILLARY: 160 mg/dL — AB (ref 65–99)
Glucose-Capillary: 135 mg/dL — ABNORMAL HIGH (ref 65–99)

## 2015-10-06 LAB — PREALBUMIN: Prealbumin: 11.3 mg/dL — ABNORMAL LOW (ref 18–38)

## 2015-10-06 LAB — CALCIUM: Calcium: 8.1 mg/dL — ABNORMAL LOW (ref 8.9–10.3)

## 2015-10-06 LAB — TSH: TSH: 3.178 u[IU]/mL (ref 0.350–4.500)

## 2015-10-06 LAB — I-STAT CG4 LACTIC ACID, ED
Lactic Acid, Venous: 0.76 mmol/L (ref 0.5–2.0)
Lactic Acid, Venous: 1 mmol/L (ref 0.5–2.0)

## 2015-10-06 LAB — INFLUENZA PANEL BY PCR (TYPE A & B)
H1N1 flu by pcr: NOT DETECTED
INFLAPCR: POSITIVE — AB
Influenza B By PCR: NEGATIVE

## 2015-10-06 LAB — RETICULOCYTES
RBC.: 2.48 MIL/uL — AB (ref 4.22–5.81)
RETIC COUNT ABSOLUTE: 84.3 10*3/uL (ref 19.0–186.0)
Retic Ct Pct: 3.4 % — ABNORMAL HIGH (ref 0.4–3.1)

## 2015-10-06 LAB — MAGNESIUM
Magnesium: 2 mg/dL (ref 1.7–2.4)
Magnesium: 2.1 mg/dL (ref 1.7–2.4)

## 2015-10-06 LAB — PHOSPHORUS: Phosphorus: 3 mg/dL (ref 2.5–4.6)

## 2015-10-06 LAB — POC OCCULT BLOOD, ED: Fecal Occult Bld: POSITIVE — AB

## 2015-10-06 LAB — MRSA PCR SCREENING: MRSA by PCR: POSITIVE — AB

## 2015-10-06 MED ORDER — NAPHAZOLINE-GLYCERIN 0.012-0.2 % OP SOLN
1.0000 [drp] | Freq: Four times a day (QID) | OPHTHALMIC | Status: DC | PRN
  Filled 2015-10-06: qty 15

## 2015-10-06 MED ORDER — VANCOMYCIN HCL IN DEXTROSE 1-5 GM/200ML-% IV SOLN: 1000.0000 mg | Freq: Once | INTRAVENOUS | Status: DC

## 2015-10-06 MED ORDER — SODIUM CHLORIDE 0.9% FLUSH
10.0000 mL | INTRAVENOUS | Status: DC | PRN
  Administered 2015-10-07: 10 mL
  Filled 2015-10-06: qty 40

## 2015-10-06 MED ORDER — DEXTROSE 5 % IV SOLN
2.0000 g | Freq: Once | INTRAVENOUS | Status: AC
  Administered 2015-10-06: 2 g via INTRAVENOUS
  Filled 2015-10-06: qty 2

## 2015-10-06 MED ORDER — LEVOTHYROXINE SODIUM 88 MCG PO TABS
88.0000 ug | ORAL_TABLET | Freq: Every day | ORAL | Status: DC
  Administered 2015-10-07: 88 ug via ORAL
  Filled 2015-10-06: qty 1

## 2015-10-06 MED ORDER — SODIUM CHLORIDE 0.9 % IV SOLN
10.0000 mL/h | Freq: Once | INTRAVENOUS | Status: AC
  Administered 2015-10-06: 10 mL/h via INTRAVENOUS

## 2015-10-06 MED ORDER — INSULIN ASPART 100 UNIT/ML ~~LOC~~ SOLN
0.0000 [IU] | SUBCUTANEOUS | Status: DC
  Administered 2015-10-06: 2 [IU] via SUBCUTANEOUS
  Administered 2015-10-06 (×2): 1 [IU] via SUBCUTANEOUS
  Administered 2015-10-07 (×4): 2 [IU] via SUBCUTANEOUS
  Filled 2015-10-06 (×2): qty 1

## 2015-10-06 MED ORDER — ASPIRIN EC 81 MG PO TBEC
81.0000 mg | DELAYED_RELEASE_TABLET | Freq: Every day | ORAL | Status: DC
  Administered 2015-10-06 – 2015-10-07 (×2): 81 mg via ORAL
  Filled 2015-10-06 (×2): qty 1

## 2015-10-06 MED ORDER — VITAMIN D 1000 UNITS PO TABS
5000.0000 [IU] | ORAL_TABLET | Freq: Every day | ORAL | Status: DC
  Administered 2015-10-06: 5000 [IU] via ORAL
  Filled 2015-10-06 (×2): qty 5

## 2015-10-06 MED ORDER — MIRTAZAPINE 7.5 MG PO TABS
15.0000 mg | ORAL_TABLET | Freq: Every day | ORAL | Status: DC
  Administered 2015-10-07: 15 mg via ORAL
  Filled 2015-10-06 (×2): qty 2

## 2015-10-06 MED ORDER — ATORVASTATIN CALCIUM 80 MG PO TABS
80.0000 mg | ORAL_TABLET | Freq: Every day | ORAL | Status: DC
  Filled 2015-10-06 (×2): qty 1

## 2015-10-06 MED ORDER — ACETAMINOPHEN 650 MG RE SUPP
650.0000 mg | RECTAL | Status: DC | PRN
  Filled 2015-10-06: qty 1

## 2015-10-06 MED ORDER — TRACE MINERALS CR-CU-MN-SE-ZN 10-1000-500-60 MCG/ML IV SOLN
INTRAVENOUS | Status: AC
  Administered 2015-10-06: 17:00:00 via INTRAVENOUS
  Filled 2015-10-06: qty 3000

## 2015-10-06 MED ORDER — ACETAMINOPHEN 325 MG PO TABS
650.0000 mg | ORAL_TABLET | Freq: Once | ORAL | Status: AC
  Administered 2015-10-06: 650 mg via ORAL
  Filled 2015-10-06: qty 2

## 2015-10-06 MED ORDER — HEPARIN SODIUM (PORCINE) 5000 UNIT/ML IJ SOLN
5000.0000 [IU] | Freq: Three times a day (TID) | INTRAMUSCULAR | Status: DC
  Administered 2015-10-06 – 2015-10-07 (×2): 5000 [IU] via SUBCUTANEOUS
  Filled 2015-10-06 (×2): qty 1

## 2015-10-06 MED ORDER — SODIUM CHLORIDE 0.9 % IV SOLN
500.0000 mg | Freq: Three times a day (TID) | INTRAVENOUS | Status: DC
  Administered 2015-10-06 – 2015-10-08 (×5): 500 mg via INTRAVENOUS
  Filled 2015-10-06 (×7): qty 500

## 2015-10-06 MED ORDER — FERROUS SULFATE 325 (65 FE) MG PO TABS
325.0000 mg | ORAL_TABLET | Freq: Every day | ORAL | Status: DC
  Administered 2015-10-07: 325 mg via ORAL
  Filled 2015-10-06: qty 1

## 2015-10-06 MED ORDER — SODIUM CHLORIDE 0.9 % IV BOLUS (SEPSIS)
1000.0000 mL | Freq: Once | INTRAVENOUS | Status: AC
  Administered 2015-10-06: 1000 mL via INTRAVENOUS

## 2015-10-06 MED ORDER — SODIUM CHLORIDE 0.9% FLUSH: 3.0000 mL | Freq: Two times a day (BID) | INTRAVENOUS | Status: DC

## 2015-10-06 MED ORDER — FAT EMULSION 20 % IV EMUL
240.0000 mL | INTRAVENOUS | Status: AC
  Administered 2015-10-06: 240 mL via INTRAVENOUS
  Filled 2015-10-06: qty 250

## 2015-10-06 MED ORDER — HEPARIN SODIUM (PORCINE) 10000 UNIT/ML IJ SOLN
7500.0000 [IU] | Freq: Three times a day (TID) | INTRAMUSCULAR | Status: DC
  Filled 2015-10-06: qty 1

## 2015-10-06 MED ORDER — ONDANSETRON HCL 4 MG PO TABS: 4.0000 mg | ORAL_TABLET | Freq: Four times a day (QID) | ORAL | Status: DC | PRN

## 2015-10-06 MED ORDER — OXYCODONE HCL 20 MG/ML PO CONC
5.0000 mg | ORAL | Status: DC | PRN
Start: 2015-10-06 — End: 2015-10-08
  Administered 2015-10-07: 10 mg via ORAL
  Filled 2015-10-06: qty 1

## 2015-10-06 MED ORDER — VANCOMYCIN HCL 10 G IV SOLR
1500.0000 mg | INTRAVENOUS | Status: DC
  Administered 2015-10-07: 1500 mg via INTRAVENOUS
  Filled 2015-10-06 (×2): qty 1500

## 2015-10-06 MED ORDER — MORPHINE SULFATE (PF) 4 MG/ML IV SOLN
4.0000 mg | Freq: Once | INTRAVENOUS | Status: AC
  Administered 2015-10-06: 4 mg via INTRAVENOUS
  Filled 2015-10-06: qty 1

## 2015-10-06 MED ORDER — ONDANSETRON HCL 4 MG/2ML IJ SOLN: 4.0000 mg | Freq: Four times a day (QID) | INTRAMUSCULAR | Status: DC | PRN

## 2015-10-06 MED ORDER — SODIUM CHLORIDE 0.9 % IV SOLN
2500.0000 mg | Freq: Once | INTRAVENOUS | Status: AC
  Administered 2015-10-06: 2500 mg via INTRAVENOUS
  Filled 2015-10-06: qty 2500

## 2015-10-06 MED ORDER — OSELTAMIVIR PHOSPHATE 6 MG/ML PO SUSR
75.0000 mg | Freq: Two times a day (BID) | ORAL | Status: DC
  Administered 2015-10-06: 75 mg via ORAL
  Filled 2015-10-06 (×2): qty 12.5

## 2015-10-06 MED ORDER — PIPERACILLIN-TAZOBACTAM 3.375 G IVPB 30 MIN
3.3750 g | Freq: Once | INTRAVENOUS | Status: AC
  Administered 2015-10-06: 3.375 g via INTRAVENOUS
  Filled 2015-10-06: qty 50

## 2015-10-06 MED ORDER — FAMOTIDINE IN NACL 20-0.9 MG/50ML-% IV SOLN
20.0000 mg | Freq: Two times a day (BID) | INTRAVENOUS | Status: DC
  Administered 2015-10-06 – 2015-10-07 (×4): 20 mg via INTRAVENOUS
  Filled 2015-10-06 (×4): qty 50

## 2015-10-06 NOTE — Consult Note (Addendum)
WOC wound consult note Reason for Consult: Consult requested for leaking ostomy pouch and leaking Vac dressing.  Pt is in the ER during the time of the consult.  He states he had surgeries performed at [facility] and then was transferred to a SNF, where he has been using negative pressure to the abd wound.  His facility Vac machine is at the bedside with the charger but is not functioning since the cannister is full; we do not carry these supplies in the [facility] formulary.  Informed patient it is our policy to apply a hospital Vac machine while he is admitted, and he can resume use of the other portable device when he is transfered back to the SNF.   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  6X6X2cm with 2 cm undermining to wound edges.  It is difficult to visualize the wound bed since large amt dark reddish brown fluid continues to pool in inner wound.  Wound edges with epibole, iIntact skin surrounding.  Removed 2 pieces black foam and applied one piece to 136mm cont suction with mod amt discomfort.  No Vac machine available at this time; ER will order and currently attached to wall suction.  Continues to have large amt reddish-brown drainage with some odor. Plan for bedside nurse to change Q Tues/Thurs/Sat.   LLQ with colostomy bag intact which was applied last night; stoma is red and viable when visualized through pouch which is intact with good seal.  There is currently no stool or flatus in the pouch.  RLQ with leaking urostomy pouch.  Stoma is flush with skin level, 1 cm oval, and surrounded by red macerated skin related to previous pouch leakage on the skin. Applied one piece urostomy pouch with barrier ring to maintain seal and attached to bedside drainage bag.  Encouraged patient to have his family members bring facility Vac machine home until he is ready for discharge. Called primary team, Erin Hearing PA, to discuss plan of care.   If sepsis or continued bleeding from abd wound are a concern, then please consult surgical team. This consult took one hour to perform. Please re-consult if further assistance is needed.  Thank-you,  Julien Girt MSN, [place], [place], [place], [place]

## 2015-10-06 NOTE — ED Notes (Signed)
Erin Hearing NP at bedside.

## 2015-10-06 NOTE — ED Notes (Signed)
Keisha from bed placement at [facility] called regarding whether patient needs a bed there still, DR. Merrell and NP Erin Hearing advised patient needs to go to [facility] still if he can get a bed sooner than he can get one here. Varney Biles made aware and advised she would keep patient on the list.

## 2015-10-06 NOTE — Progress Notes (Signed)
Pharmacy Antibiotic Note  Richard Warner is a 65 y.o. male admitted on 10/05/2015 with sepsis.  Pharmacy has been consulted for vancomycin and primaxin dosing. Tmax is 101.3 and WBC is WNL. Scr is elevated at 1.77. Lactic acid is 1. Loaded with 2.5gm vancomycin earlier this morning.   Plan: - Vancomycin 1500mg  IV Q24h - Primaxin 500mg  IV Q8H - F/u renal fxn, C&S, clinical status and trough at SS  Height: 5\' 11"  (180.3 cm) Weight: 295 lb 6.7 oz (134 kg) IBW/kg (Calculated) : 75.3  Temp (24hrs), Avg:100.3 F (37.9 C), Min:99.3 F (37.4 C), Max:101.3 F (38.5 C)   Recent Labs Lab 10/06/15 0005 10/06/15 0024 10/06/15 0149 10/06/15 0426  WBC QUESTIONABLE RESULTS, RECOMMEND RECOLLECT TO VERIFY  --  4.1  --   CREATININE 1.69*  --  1.77*  --   LATICACIDVEN  --  0.76  --  1.00    Estimated Creatinine Clearance: 58.1 mL/min (by C-G formula based on Cr of 1.77).    No Known Allergies  Antimicrobials this admission: Vanc 3/2>> Primaxin 3/2>> Zosyn x 1 3/2 Cefepime x 1 3/2  Dose adjustments this admission: N/A  Microbiology results: Pending  Thank you for allowing pharmacy to be a part of this patient's care.  Tobi Leinweber, Rande Lawman 10/06/2015 8:09 AM

## 2015-10-06 NOTE — H&P (Signed)
[facility] History and Physical                                                                                    Richard Warner, is a 65 y.o. male  MRN: [id number]   DOB - 1950-10-30  Admit Date - 10/05/2015  Outpatient Primary MD for the patient is Rachell Cipro, MD  Referring MD: Dina Rich / ER  Consulting M.D: Cloyd Stagers / surgical oncologist [facility]  PMH: Past Medical History  Diagnosis Date  . Hypertension   . Pulmonary embolism ([place]) 12-03-11    01-17-10 s/p DVT left leg -Coumadin tx.until 08-28-11  . Hyperlipidemia   . Leg swelling 12-03-11    bilateral if up most of day,equally swells, up to knee level, improved now  . Fractures 12-03-11    s/p right ankle and right wrist- no problems now  . Hypothyroidism   . Dyslipidemia   . Anemia 12-03-11    tx. oral iron supplement  . Arthritis 12-03-11    mild lower back  . Malignant neoplasm of sigmoid (flexure) ([place]) 10/08/2011  . Colon cancer ([place]) 12/14/11    Low anterior resection of mid/proximal tumor=invasive adenocarcinoma,invaing through the muscularis propria into pericolonic fatty tissue  . Chronic kidney disease 12-03-11    kidney stone x1 ,none recent stage III  . Secondary hyperparathyroidism, renal ([place])   . Chronic anticoagulation   . Shortness of breath 12-03-11    hx. 6'11- during pulmonary emboli episode, none now, extreme exertion only.  . Morbid obesity ([place])   . Vitamin D deficiency   . Chronic pulmonary edema   . Dorsalgia   . Acute MI, true posterior wall, initial episode of care [facility]) 02/27/2015      PSH: Past Surgical History  Procedure Laterality Date  . Colonoscopy  10/08/2011    Procedure: COLONOSCOPY;  Surgeon: Inda Castle, MD;  Location: [facility];  Service: Endoscopy;  Laterality: N/A;  . Tonsillectomy  12-03-11    child  . Proctoscopy  12/14/2011    Procedure: PROCTOSCOPY;  Surgeon: [name], MD;  Location: [facility] ORS;  Service: General;  Laterality: N/A;  . Low anterior bowel  resection  12/14/2011    Mid/proximal rectal cancer. T3 N0  . Colonoscopy N/A 05/15/2013    Procedure: COLONOSCOPY;  Surgeon: Inda Castle, MD;  [facility]: [facility];  Service: Endoscopy;  Laterality: N/A;  . Colonoscopy N/A 07/27/2014    Procedure: COLONOSCOPY;  Surgeon: Inda Castle, MD;  [facility]: [facility];  Service: Endoscopy;  Laterality: N/A;  . Cyst removal trunk N/A 09/17/2014    Procedure: REMOVAL OF ANTERIOR CHEST WALL SKIN MASS;  Surgeon: Michael Boston, MD;  Location: [facility];  Service: General;  Laterality: N/A;  . Cystoscopy with biopsy N/A 12/03/2014    Procedure: CYSTOSCOPY WITH BIOPSY;  Surgeon: Lowella Bandy, MD;  Location: [facility];  Service: Urology;  Laterality: N/A;  . Eus N/A 01/06/2015    Procedure: LOWER ENDOSCOPIC ULTRASOUND (EUS);  Surgeon: [name], MD;  Location: [facility];  Service: Endoscopy;  Laterality: N/A;  . Transurethral resection of bladder tumor N/A 02/23/2015    Procedure: TRANSURETHRAL RESECTION OF BLADDER  MASS;  Surgeon: Lowella Bandy, MD;  Location: [facility];  Service: Urology;  Laterality: N/A;  . Cystoscopy N/A 02/23/2015    Procedure: CYSTOSCOPY;  Surgeon: Lowella Bandy, MD;  Location: [facility];  Service: Urology;  Laterality: N/A;  . Cardiac catheterization N/A 02/27/2015    Procedure: Coronary Stent Intervention;  Surgeon: [name], MD;  Location: [facility];  Service: Cardiovascular;  Laterality: N/A;     CC:  Chief Complaint  Patient presents with  . Fever     HPI: 65 year old male patient with known history of DVT with recurrent PE dating back to 2011, OSA not on CPAP, morbid obesity, hypertension, dyslipidemia, hypothyroidism, CAD status post drug-eluting stent after MI July 2016. Patient also has a history of rectal cancer dating that to 2013 at which time he underwent a low anterior resection and chemoradiation. 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 2 days postop patient developed rectal dehiscence and underwent operative repair and was left with an open abdominal wound with wound VAC and multiple drains. Initial surgical treatment appeared to have repaired the dehiscence but he had an additional anastomotic leak and developed a enteric cutaneous fistula. He now remains NPO except for meds and is on chronic TPN. During that hospitalization he had a Pseudomonas UTI that was resistant to Levaquin. He also was found to have MRSA bacteremia that was initially treated with vancomycin and subsequently treated with daptomycin and Ceftaroline. He underwent a TEE during which revealed no evidence of vegetations or other abnormalities. He has only recently discharged to skilled nursing facility in [place] (about 2 weeks ago). Patient reports that since discharge she has "felt worse and worse". He reports that he has not been getting out of bed to have PT and OT because of "insurance issues". He has not had any nausea or vomiting. He has chronic left lower quadrant abdominal pain since surgical procedure. He was found to have a temperature of 101.9 at the nursing facility and thus was sent to the ER for evaluation. Apparently there was more blood than typical being aspirated by the wound VAC (reported by staff to EMS).  ER Evaluation and treatment: Initial temperature 99.3 orally with rectal temperature 101.3, initial respirations were 28 with a pulse of 136 and BP 120/68. Saturations on 2 L oxygen 98%. After given fluids and Tylenol for fever and tachycardia had improved to 115 bpm with respirations 20 BP 108/65. EKG: Sinus tachycardia with ventricular rate 139 bpm, QTC 430 ms, no acute ST segment or T-wave changes that would be concerning for ischemia. No ectopy. PCXR: Bibasilar atelectasis with low inspiratory volumes but otherwise no focal infiltrates CT abdomen and pelvis without contrast:  Marked irregular soft tissue density within the pelvis surrounding a collection of complex fluid and trace air measuring 6.9 x 4.5 x 5.5 cm. His is contiguous with the inferior aspect of the patient's anterior abdominal dehiscence with clinical correlation of leakage of liquid stool and blood from the abdominal wound compatible with previously suspected/described this shows collection to the adjacent small bowel with abscess formation and likely surrounding recurrent mass. He was also found to be diffuse soft tissue inflammation within the pelvis that tracked superiorly along the mesentery. There is also a tiny residual Hartman's pouch which is decompressed with a question of possible communication with the complex pelvic abscess given reported bleeding from the  rectum. Laboratory data: Na 145, k 4.3, CO2 17, BUN 48, Cr 1.77, glucose 183, AP 254, albumin 1.5, AST 55, ALT 85, lactic acid negative 2 collections, WBC 4100 with neutrophils 77% absolute neutrophils 3.1, hemoglobin 7.3, platelets 226,000, blood cultures and urine culture obtained in the ER, UA abnormal with turbid appearance, many bacteria, triple crystals, small hemoglobin, small leukocytes, wbc's 0-5, protein 30 Tylenol 650 mg 1 Normal saline bolus 2 L Morphine 4 mg IV 1 Vancomycin 2500 mg IV 1 Maxipime 2 g IV 1 Zosyn 3.375 g IV 1  Review of Systems   In addition to the HPI above,  No Headache, changes with Vision or hearing, new weakness, tingling, numbness in any extremity, No problems swallowing food or Liquids, indigestion/reflux No Chest pain, Cough or Shortness of Breath, palpitations, orthopnea or DOE No Abdominal pain, N/V; no melena or hematochezia, no dark tarry stools but on DRE patient was found to have heme positive stools with small amounts of frank red blood No dysuria, hematuria or flank pain No new skin rashes, lesions, masses or bruises, No new joints pains-aches No recent weight gain or loss No polyuria,  polydypsia or polyphagia,  *A full 10 point Review of Systems was done, except as stated above, all other Review of Systems were negative.  Social History Social History  Substance Use Topics  . Smoking status: Never Smoker   . Smokeless tobacco: Never Used  . Alcohol Use: No    Resides [id number] Living skilled nursing facility  Lives with: N/A  Ambulatory status: Currently nonambulatory per patient report   Family History Family History  Problem Relation Age of Onset  . Diabetes Father   . Heart disease Father   . Stroke Maternal Grandmother   . Stroke Maternal Grandfather   . Hypertension Paternal Grandfather   . Lung cancer Paternal Grandfather   . Cancer Paternal Grandfather      Prior to Admission medications   Medication Sig Start Date End Date Taking? Authorizing Provider  aspirin EC 81 MG tablet Take 1 tablet (81 mg total) by mouth daily. 04/12/15  Yes Isaiah Serge, NP  atorvastatin (LIPITOR) 80 MG tablet Take 1 tablet (80 mg total) by mouth daily at 6 PM. 03/02/15  Yes Brittainy Erie Noe, PA-C  Cholecalciferol (VITAMIN D3) 5000 UNITS TABS Take 5,000 Units by mouth daily after supper.    Yes Historical Provider, MD  famotidine (PEPCID) 20 MG tablet Take 20 mg by mouth daily.   Yes Historical Provider, MD  ferrous sulfate 325 (65 FE) MG tablet Take 1 tablet (325 mg total) by mouth 3 (three) times daily with meals. Patient taking differently: Take 325 mg by mouth daily with breakfast.  03/19/15  Yes Christina P Rama, MD  heparin 1000 UNIT/ML injection Inject 750 Units into the vein every 8 (eight) hours.   Yes Historical Provider, MD  insulin regular (NOVOLIN R,HUMULIN R) 100 units/mL injection Inject 5 Units into the skin every 6 (six) hours as needed for high blood sugar (CBG is greater than 150).   Yes Historical Provider, MD  levothyroxine (SYNTHROID, LEVOTHROID) 88 MCG tablet Take 88 mcg by mouth daily before breakfast.   Yes Historical Provider, MD  metoprolol  tartrate (LOPRESSOR) 25 MG tablet Take 1 tablet (25 mg total) by mouth 2 (two) times daily. Patient taking differently: Take 25 mg by mouth daily.  05/23/15  Yes [name], MD  mirtazapine (REMERON) 15 MG tablet Take 15 mg by mouth at bedtime.  Yes Historical Provider, MD  nitroGLYCERIN (NITROSTAT) 0.4 MG SL tablet Place 0.4 mg under the tongue every 5 (five) minutes as needed for chest pain (3 doses MAX).   Yes Historical Provider, MD  oxyCODONE (OXY IR/ROXICODONE) 5 MG immediate release tablet Take 5 mg by mouth every 4 (four) hours as needed for severe pain.   Yes Historical Provider, MD  tetrahydrozoline 0.05 % ophthalmic solution Place 2 drops into both eyes every 8 (eight) hours as needed (dry eyes).   Yes Historical Provider, MD    No Known Allergies  Physical Exam  Vitals  Blood pressure 117/69, pulse 122, temperature 98.6 F (37 C), temperature source Oral, resp. rate 29, height 5\' 11"  (1.803 m), weight 295 lb 6.7 oz (134 kg), SpO2 99 %.   General:  Appears chronically ill and mildly toxic  Psych:  Normal affect, Denies Suicidal or Homicidal ideations, Awake Alert, Oriented X 3. Speech and thought patterns are clear and appropriate, no apparent short term memory deficits  Neuro:   No focal neurological deficits, CN II through XII intact, Strength 4/5 all 4 extremities, Sensation intact all 4 extremities.  ENT:  Ears and Eyes appear Normal, Conjunctivae clear, PER. dry oral mucosa without erythema or exudates.  Neck:  Supple, No lymphadenopathy appreciated  Respiratory:  Symmetrical chest wall movement, diminished air movement bilaterally merely at bases, CTAB. 2 L; right anterior chest wall with central line intact-on basic inspection site appears unremarkable-TPN infusing  Cardiac:  RRR, No Murmurs, trace bilateral LE edema noted, no JVD, No carotid bruits, peripheral pulses palpable at 2+  Abdomen:  Positive bowel sounds, Soft, minimally tender left lower quadrant,  Non distended,  No masses appreciated, right mid quadrant ileal conduit with cloudy yellow urine with marked muco-sediment-ostomy left upper quadrant without stool-midline incision open and inferior aspect with wound VAC in place  Skin:  No Cyanosis, Normal Skin Turgor, No Skin Rash or Bruise.  Extremities: Symmetrical without obvious trauma or injury,  no effusions.  Data Review  CBC  Recent Labs Lab 10/06/15 0005 10/06/15 0149  WBC QUESTIONABLE RESULTS, RECOMMEND RECOLLECT TO VERIFY 4.1  HGB 6.4* 7.3*  HCT 22.2* 23.9*  PLT QUESTIONABLE RESULTS, RECOMMEND RECOLLECT TO VERIFY 226  MCV 113.8* 89.8  MCH 32.8 27.4  MCHC 28.8* 30.5  RDW 17.4* 16.1*  LYMPHSABS QUESTIONABLE RESULTS, RECOMMEND RECOLLECT TO VERIFY 0.3*  MONOABS QUESTIONABLE RESULTS, RECOMMEND RECOLLECT TO VERIFY 0.6  EOSABS QUESTIONABLE RESULTS, RECOMMEND RECOLLECT TO VERIFY 0.0  BASOSABS QUESTIONABLE RESULTS, RECOMMEND RECOLLECT TO VERIFY 0.0    Chemistries   Recent Labs Lab 10/06/15 0005 10/06/15 0149  NA 123* 135  K >7.5* 4.3  CL 90* 107  CO2 10* 17*  GLUCOSE 2328* 183*  BUN 38* 48*  CREATININE 1.69* 1.77*  CALCIUM 6.9* 8.2*  AST 52* 55*  ALT 63 85*  ALKPHOS 147* 254*  BILITOT 0.5 0.6    estimated creatinine clearance is 58.1 mL/min (by C-G formula based on Cr of 1.77).  No results for input(s): TSH, T4TOTAL, T3FREE, THYROIDAB in the last 72 hours.  Invalid input(s): FREET3  Coagulation profile No results for input(s): INR, PROTIME in the last 168 hours.  No results for input(s): DDIMER in the last 72 hours.  Cardiac Enzymes No results for input(s): CKMB, TROPONINI, MYOGLOBIN in the last 168 hours.  Invalid input(s): CK  Invalid input(s): POCBNP  Urinalysis    Component Value Date/Time   COLORURINE YELLOW 10/06/2015 0053   APPEARANCEUR TURBID* 10/06/2015 0053   LABSPEC  1.012 10/06/2015 0053   LABSPEC 1.010 08/31/2014 1228   PHURINE 8.5* 10/06/2015 0053   PHURINE 6.0 08/31/2014 1228    GLUCOSEU NEGATIVE 10/06/2015 0053   GLUCOSEU Negative 08/31/2014 1228   HGBUR SMALL* 10/06/2015 0053   HGBUR Small 08/31/2014 [place] 10/06/2015 0053   BILIRUBINUR Negative 08/31/2014 [place] 10/06/2015 0053   KETONESUR Negative 08/31/2014 1228   PROTEINUR 30* 10/06/2015 0053   PROTEINUR Negative 08/31/2014 1228   UROBILINOGEN 1.0 03/11/2015 1744   UROBILINOGEN 0.2 08/31/2014 1228   NITRITE NEGATIVE 10/06/2015 0053   NITRITE Negative 08/31/2014 1228   LEUKOCYTESUR SMALL* 10/06/2015 0053   LEUKOCYTESUR Negative 08/31/2014 1228    Imaging results:   Ct Abdomen Pelvis Wo Contrast  10/06/2015  CLINICAL DATA:  Acute onset of generalized abdominal pain and fever. Blood noted within the abdominal wound vac. Initial encounter. EXAM: CT ABDOMEN AND PELVIS WITHOUT CONTRAST TECHNIQUE: Multidetector CT imaging of the abdomen and pelvis was performed following the standard protocol without IV contrast. COMPARISON:  CT of the abdomen and pelvis from 08/28/2011, and PET/CT performed 12/27/2014 FINDINGS: A trace right pleural effusion is noted. Bibasilar atelectasis is noted. Diffuse coronary artery calcifications are seen. The liver is unremarkable in appearance. The spleen is enlarged, measuring 15.4 cm in length. The gallbladder is mildly distended, containing a small stone and vicarious contrast excretion. It is otherwise grossly unremarkable. The pancreas and adrenal glands are unremarkable. The kidneys are unremarkable in appearance. There is no evidence of hydronephrosis. No renal or ureteral stones are seen. Minimal bilateral perinephric stranding is noted. The small bowel is unremarkable in appearance. The stomach is within normal limits. No acute vascular abnormalities are seen. Minimal calcification is noted along the abdominal aorta and its branches. The patient's left lower quadrant colostomy is decompressed and unremarkable in appearance. The right lower  quadrant urostomy is also grossly unremarkable, aside from mild surrounding inflammation. The patient is status post cystectomy. Marked irregular soft tissue density is noted within the pelvis, surrounding a collection of complex fluid and trace air measuring approximately 6.9 x 4.5 x 5.5 cm. This appears contiguous with the inferior aspect of the patient's anterior abdominal dehiscence. On clinical correlation, there appears to be leakage of liquid stool and blood from the abdominal wound, compatible with previously suspected fistulous connection to the adjacent small bowel, abscess formation, and likely surrounding recurrent mass. Diffuse surrounding soft tissue inflammation is noted within the pelvis, tracking superiorly along the mesentery. The patient's tiny residual Hartmann's pouch is decompressed, though it may communicate with the complex pelvic abscess, given bleeding from the rectum. No inguinal lymphadenopathy is seen. A small left inguinal hernia is noted, containing only fat. No acute osseous abnormalities are identified. IMPRESSION: 1. Marked irregular soft tissue density within the pelvis, surrounding a collection of complex fluid and trace air measuring approximately 6.9 x 4.5 x 5.5 cm. This appears contiguous with the inferior aspect of the patient's anterior abdominal dehiscence. On clinical correlation, there is leakage of liquid stool and blood from the abdominal wound, compatible with previously suspected fistulous connection to the adjacent small bowel, abscess formation, and likely surrounding recurrent mass. 2. Diffuse surrounding soft tissue inflammation within the pelvis tracks superiorly along the mesentery. 3. Tiny residual Hartmann's pouch is decompressed, though it may communicate with the complex pelvic abscess, given bleeding from the rectum. 4. Small left inguinal hernia, containing only fat. 5. Trace right pleural effusion.  Bibasilar atelectasis noted. 6. Diffuse coronary artery  calcifications seen. 7. Splenomegaly noted. 8. Cholelithiasis.  Gallbladder otherwise unremarkable. These results were called by telephone at the time of interpretation on 10/06/2015 at 5:07 am to Dr. Thayer Jew, who verbally acknowledged these results. Electronically Signed   By: Garald Balding M.D.   On: 10/06/2015 05:14   Dg Chest Portable 1 View  10/06/2015  CLINICAL DATA:  Fever for 2 days. History of pulmonary embolism, hypertension, shortness of breath. EXAM: PORTABLE CHEST 1 VIEW COMPARISON:  Chest radiograph February 27, 2015 FINDINGS: The cardiac silhouette appears moderately enlarged. Mediastinal silhouette is nonsuspicious. Low inspiratory examination with bibasilar strandy densities. No pleural effusion. No pneumothorax. RIGHT tunnel probable catheter terminates at cavoatrial junction. Large body habitus. Osseous structures are nonsuspicious. Mild degenerative change of the thoracic spine. IMPRESSION: Similar cardiomegaly. Bibasilar atelectasis in this low inspiratory examination. Electronically Signed   By: Elon Alas M.D.   On: 10/06/2015 00:30     EKG: (Independently reviewed)  Sinus tachycardia with ventricular rate 139 bpm, QTC 430 ms, no acute ST segment or T-wave changes that would be concerning for ischemia. No ectopy.   Assessment & Plan  Principal Problem:   Sepsis vs SIRS -Patient with multiple medical problems including a significant complex surgical history as result of recurrent rectal cancer; has a chronic enterocutaneous fistula and history of MRSA bacteremia and Pseudomonas UTI -Currently hemodynamically stable with normal lactate; tachycardia and tachypnea had initially resolved after fluids and treatment of febrile state that has recurred so we'll continue to monitor closely -Admit to SDU/Inpatient -Differential includes recurrent UTI, bacteremia in setting of chronic TPN versus translocation of enteric pathogens versus viral illness such as influenza (check PCR  and viral panel) -Follow up on blood cultures and urine culture -Empiric broad-spectrum antibiotics with imipenem and vancomycin -Continue supportive care  Active Problems:   Rectal cancer, pT3pN0 (0/12 LN) h/o LAR and chemorad; recurred Nov 2016 s/p total cystectomy/Open wnd anterior abdomen/Entero-enteric fistula -Followed by surgical oncologist at [place] patient has been accepted for transfer but unfortunately no beds are currently available but the transfer center did call the EDP back to confirm patient is on the list once a bed becomes available -NPO and continue TPN with pharmacy managing -Patient quite deconditioned and currently resides at skilled nursing facility -Patient confirmed this admission DO NOT RESUSCITATE status -CT abdomen and pelvis this admission read demonstrates known EC fistula with adjacent mass and walled off fluid collection; I reviewed to previous CTs from [facility] but unfortunately they did not give dimensions of the fluid collection so it is unknown at this collection is stable or has increased in size. On exam patient does not have abdominal pain other than the chronic left lower quadrant pain    Essential hypertension -Blood pressure currently well controlled    History of DVT and recurrent PE -Continue preadmission heparin 7500 units subcutaneous every 8 hours    RENAL DISEASE, CHRONIC, STAGE III -Baseline renal function August 2016 BUN 15 and creatinine 1.53 ([place]) -Most recent renal function at [facility] February 20 was BUN 43 and creatinine 1.54 -Current BUN 48 and creatinine 1.77 elevated BUN likely reflective of chronic TPN -Follow labs and avoid nephrotoxins    CAD 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 s/p DES -Currently chest pain free -During previous admission at [facility] patient had issues with recurrent anemia requiring blood transfusion and his Plavix was discontinued but he was continued on low-dose aspirin and beta blocker as  well as a statin  Hypothyroidism -Continue Synthroid    HLD  -Continue statin    Obesity, Class III, BMI 40-49.9  -Current weight 295 pounds with a BMI of 41.3    Iron deficiency anemia, unspecified -Repeat anemia panel and continue preadmission iron which currently is daily dosed -Patient's hemoglobins have been around 7-7.5 at [facility] but prior to recurrence of cancer his hemoglobin ranged between 9.8 and 12.3-because of history of known CAD with MI, EDP opted to transfuse patient is one unit of PRBCs -Patient with known recurrent bleeding through wound VAC and was found to have heme positive stools on rectal exam -History of low anterior resection with rectal pouch that may or may not have a fistulous connection to the known intra-abdominal EC fistula-in review of documentation from [facility] patient did have a rectal Foley in place during previous hospitalization and may have local trauma to the rectum that is contributing to the rectal bleeding noticed by EDP - EDP did discuss with Dr. Cloyd Stagers who recommended that if patient continues to have evidence of bleeding he recommended GI consult for possible colonoscopy.    DVT Prophylaxis: Subcutaneous heparin  Family Communication:   No family at bedside  Code Status:  DO NOT RESUSCITATE confirmed by attending physician  Condition:  Guarded  Discharge disposition: Anticipate return either to previous skilled nursing facility or possibly to LTAC  Time spent in minutes : 60      Kathrine Rieves L. ANP on 10/06/2015 at 8:14 AM  You may contact me by going to [email or web address] - password [email or web address]  I am available from 7a-7p but please confirm I am on the schedule by going to Amion as above.   After 7p please contact night coverage person covering me after hours  [facility]

## 2015-10-06 NOTE — ED Notes (Signed)
Urostomy bag leaking, request form sent to materials management

## 2015-10-06 NOTE — ED Notes (Signed)
Temp checked at end of blood transfusion, temp 100.4, DR. Merrell made aware, Erin Hearing, NP working with DR. Merrell spoke with this RN regarding situation, Ebony Hail advised she is not concerned for transfusion reaction since patient is here for fevers, advised patient can get some tylenol for temp.

## 2015-10-06 NOTE — Progress Notes (Addendum)
Influenza returned positive for Flu A- will begin NS IVFs at 75/hr until defervesces-begin Tamiflu elixir. Wife updated regarding this diagnosis and plans to investigate patient eligibility for PT/OT at skilled nursing facility and possibility of LTAC placement-wife adamantly states she does not want her husband to return to previous skilled nursing facility.  Erin Hearing ANP

## 2015-10-06 NOTE — ED Notes (Signed)
Patient transported to CT 

## 2015-10-06 NOTE — Progress Notes (Signed)
PARENTERAL NUTRITION CONSULT NOTE - INITIAL  Pharmacy Consult for TPN Indication: Fistula  No Known Allergies  Patient Measurements: Height: 5\' 11"  (180.3 cm) Weight: 295 lb 6.7 oz (134 kg) IBW/kg (Calculated) : 75.3 Adjusted Body Weight: Usual Weight:   Vital Signs: Temp: 101.3 F (38.5 C) (03/02 0147) Temp Source: Rectal (03/02 0147) BP: 114/60 mmHg (03/02 0715) Pulse Rate: 103 (03/02 0715) Intake/Output from previous day: 03/01 0701 - 03/02 0700 In: 1050 [I.V.:1050] Out: 900 [Urine:900] Intake/Output from this shift:    Labs:  Recent Labs  10/06/15 0005 10/06/15 0149  WBC QUESTIONABLE RESULTS, RECOMMEND RECOLLECT TO VERIFY 4.1  HGB 6.4* 7.3*  HCT 22.2* 23.9*  PLT QUESTIONABLE RESULTS, RECOMMEND RECOLLECT TO VERIFY 226     Recent Labs  10/06/15 0005 10/06/15 0149  NA 123* 135  K >7.5* 4.3  CL 90* 107  CO2 10* 17*  GLUCOSE 2328* 183*  BUN 38* 48*  CREATININE 1.69* 1.77*  CALCIUM 6.9* 8.2*  PROT 4.4* 6.1*  ALBUMIN 1.1* 1.5*  AST 52* 55*  ALT 63 85*  ALKPHOS 147* 254*  BILITOT 0.5 0.6   Estimated Creatinine Clearance: 58.1 mL/min (by C-G formula based on Cr of 1.77).    Recent Labs  10/06/15 0239  GLUCAP 193*    Medical History: Past Medical History  Diagnosis Date  . Hypertension   . Pulmonary embolism (HCC) 12-03-11    01-17-10 s/p DVT left leg -Coumadin tx.until 08-28-11  . Hyperlipidemia   . Leg swelling 12-03-11    bilateral if up most of day,equally swells, up to knee level, improved now  . Fractures 12-03-11    s/p right ankle and right wrist- no problems now  . Hypothyroidism   . Dyslipidemia   . Anemia 12-03-11    tx. oral iron supplement  . Arthritis 12-03-11    mild lower back  . 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 Colon cancer (HCC) 12/14/11    Low anterior resection of mid/proximal tumor=invasive adenocarcinoma,invaing through the muscularis propria into pericolonic fatty tissue  . Chronic kidney disease  12-03-11    kidney stone x1 ,none recent stage III  . Secondary hyperparathyroidism, renal (HCC)   . Chronic anticoagulation   . Shortness of breath 12-03-11    hx. 6'11- during pulmonary emboli episode, none now, extreme exertion only.  . Morbid obesity (HCC)   . Vitamin D deficiency   . Chronic pulmonary edema   . Dorsalgia   . Acute MI, true posterior wall, initial episode of care (HCC) 02/27/2015     Insulin Requirements in the past 24 hours:  1 unit SSI  Current Nutrition:  Received TPN orders from [facility]: The patient was on a cyclic TPN and received 2970 ml cycled over 18 hours. The TPN contained electrolytes along with 275g (935 kcal) dextrose, 190g (760 kcal) protein, and 60g (540 kcal) lipids. TE and MV were also added to the bag. No insulin was noted to be in the TPN  Total kcal was 2235 and total protein was 190g.   Nutritional Goals:  Awaiting RD assessment  Assessment: Richard Warner with history of rectal cancer in 2013 at which time he underwent a  low anterior resection and chemoradiation. 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 2 days postop patient developed rectal dehiscence and underwent operative repair and was left  with an open abdominal wound with wound VAC and multiple drains. Initial surgical treatment appeared to have repaired the dehiscence but he had an additional anastomotic leak and developed a enteric cutaneous fistula. He now remains NPO except for meds and is on chronic TPN.  **Plans are to transfer to [facility] when a bed is available**  GI: Hx CA now s/p colostomy - now with EC fistula. On chronic TPN. We will be unable to meet the high protein and lower kcal goals with the Clinimix premix bags.  Endo: Hx hypothyroidism/obesity, no hx DM. CBGs 140-193. On sensitive SSI - given the higher dextrose content in the  pre-mix Clinimix, the patient will likely require some insulin to be added to the TPN bag. Will change the patient from cyclic over 18 hours to a 24 hour TPN bag while monitoring CBGs with the higher dextrose content.  Lytes: Na 135, K 4.3, Mg 2, Phos 3 Renal: SCr 1.77, CrCl~40-50 ml/min.  Pulm: 100%/2L-[place] Cards: Hx HTN/DL/CAD(PCI July '16) Hepatobil: AST/ALT 55/85, Alk phos 254, Tbili wnl Neuro: Pain score/24h: 9 (chronic leg pain) ID: Vanc + Primaxin for r/o sepsis. Tmax/24h: 100.4, WBC wnl.  If cultures become positive - will need to stop TPN and have a line holiday.  Best Practices:  TPN Access: R-chest wall central line placed PTA TPN start date:  PTA  Plan:  - Resume Clinimix E 5/15 at a rate of 125 ml/hr and 20% IVFE at a rate of 10 ml/hr to provide 2610 kcal and 150g protein per day (115% kcal and ~80% protein compared to home TPN) - Add MV + TE to the TPN bag - Given the higher dextrose content in Clinimix compared to the patient's home TPN - will infuse TPN over 24 hours initially prior to switching back to cyclic over 18 hours - Continue sensitive SSI every 4 hours - will consider adjustment to a different scale pending CBGs on Clinimix - Will f/u TPN labs and RD assessment  Alycia Rossetti, PharmD, BCPS Clinical Pharmacist Pager: [phone] 10/06/2015 11:34 AM

## 2015-10-06 NOTE — ED Notes (Signed)
Wound nurse [place] at bedside

## 2015-10-07 DIAGNOSIS — N183 Chronic kidney disease, stage 3 (moderate): Secondary | ICD-10-CM

## 2015-10-07 DIAGNOSIS — R7881 Bacteremia: Secondary | ICD-10-CM

## 2015-10-07 DIAGNOSIS — B952 Enterococcus as the cause of diseases classified elsewhere: Secondary | ICD-10-CM

## 2015-10-07 DIAGNOSIS — A419 Sepsis, unspecified organism: Secondary | ICD-10-CM

## 2015-10-07 DIAGNOSIS — C20 Malignant neoplasm of rectum: Secondary | ICD-10-CM

## 2015-10-07 DIAGNOSIS — D509 Iron deficiency anemia, unspecified: Secondary | ICD-10-CM

## 2015-10-07 DIAGNOSIS — S31109A Unspecified open wound of abdominal wall, unspecified quadrant without penetration into peritoneal cavity, initial encounter: Secondary | ICD-10-CM

## 2015-10-07 DIAGNOSIS — I10 Essential (primary) hypertension: Secondary | ICD-10-CM

## 2015-10-07 DIAGNOSIS — K632 Fistula of intestine: Secondary | ICD-10-CM

## 2015-10-07 DIAGNOSIS — Z86718 Personal history of other venous thrombosis and embolism: Secondary | ICD-10-CM

## 2015-10-07 LAB — COMPREHENSIVE METABOLIC PANEL
ALT: 59 U/L (ref 17–63)
ANION GAP: 10 (ref 5–15)
AST: 45 U/L — ABNORMAL HIGH (ref 15–41)
Albumin: 1.3 g/dL — ABNORMAL LOW (ref 3.5–5.0)
Alkaline Phosphatase: 215 U/L — ABNORMAL HIGH (ref 38–126)
BUN: 37 mg/dL — ABNORMAL HIGH (ref 6–20)
CHLORIDE: 106 mmol/L (ref 101–111)
CO2: 19 mmol/L — AB (ref 22–32)
Calcium: 8.2 mg/dL — ABNORMAL LOW (ref 8.9–10.3)
Creatinine, Ser: 1.58 mg/dL — ABNORMAL HIGH (ref 0.61–1.24)
GFR, EST AFRICAN AMERICAN: 51 mL/min — AB (ref >60)
GFR, EST NON AFRICAN AMERICAN: 44 mL/min — AB (ref >60)
Glucose, Bld: 175 mg/dL — ABNORMAL HIGH (ref 65–99)
POTASSIUM: 4.3 mmol/L (ref 3.5–5.1)
SODIUM: 135 mmol/L (ref 135–145)
Total Bilirubin: 0.3 mg/dL (ref 0.3–1.2)
Total Protein: 5.4 g/dL — ABNORMAL LOW (ref 6.5–8.1)

## 2015-10-07 LAB — DIFFERENTIAL
BASOS ABS: 0 10*3/uL (ref 0.0–0.1)
BASOS PCT: 0 %
EOS ABS: 0.1 10*3/uL (ref 0.0–0.7)
EOS PCT: 2 %
LYMPHS ABS: 0.6 10*3/uL — AB (ref 0.7–4.0)
Lymphocytes Relative: 16 %
MONOS PCT: 9 %
Monocytes Absolute: 0.3 10*3/uL (ref 0.1–1.0)
Neutro Abs: 2.6 10*3/uL (ref 1.7–7.7)
Neutrophils Relative %: 73 %

## 2015-10-07 LAB — IRON AND TIBC
Iron: 12 ug/dL — ABNORMAL LOW (ref 45–182)
SATURATION RATIOS: 8 % — AB (ref 17.9–39.5)
TIBC: 146 ug/dL — AB (ref 250–450)
UIBC: 134 ug/dL

## 2015-10-07 LAB — URINE CULTURE

## 2015-10-07 LAB — PHOSPHORUS: PHOSPHORUS: 3.1 mg/dL (ref 2.5–4.6)

## 2015-10-07 LAB — HEMOGLOBIN A1C
Hgb A1c MFr Bld: 6 % — ABNORMAL HIGH (ref 4.8–5.6)
Mean Plasma Glucose: 126 mg/dL

## 2015-10-07 LAB — CBC
HCT: 23.2 % — ABNORMAL LOW (ref 39.0–52.0)
HEMOGLOBIN: 7.6 g/dL — AB (ref 13.0–17.0)
MCH: 29.6 pg (ref 26.0–34.0)
MCHC: 32.8 g/dL (ref 30.0–36.0)
MCV: 90.3 fL (ref 78.0–100.0)
Platelets: 193 10*3/uL (ref 150–400)
RBC: 2.57 MIL/uL — AB (ref 4.22–5.81)
RDW: 15.8 % — ABNORMAL HIGH (ref 11.5–15.5)
WBC: 3.5 10*3/uL — AB (ref 4.0–10.5)

## 2015-10-07 LAB — TYPE AND SCREEN
ABO/RH(D): A POS
Antibody Screen: NEGATIVE
Unit division: 0

## 2015-10-07 LAB — HEPARIN LEVEL (UNFRACTIONATED): Heparin Unfractionated: 0.29 IU/mL — ABNORMAL LOW (ref 0.30–0.70)

## 2015-10-07 LAB — GLUCOSE, CAPILLARY
GLUCOSE-CAPILLARY: 137 mg/dL — AB (ref 65–99)
GLUCOSE-CAPILLARY: 153 mg/dL — AB (ref 65–99)
GLUCOSE-CAPILLARY: 170 mg/dL — AB (ref 65–99)
GLUCOSE-CAPILLARY: 174 mg/dL — AB (ref 65–99)
GLUCOSE-CAPILLARY: 190 mg/dL — AB (ref 65–99)
Glucose-Capillary: 96 mg/dL (ref 65–99)

## 2015-10-07 LAB — PROTIME-INR
INR: 1.31 (ref 0.00–1.49)
PROTHROMBIN TIME: 16.4 s — AB (ref 11.6–15.2)

## 2015-10-07 LAB — RESPIRATORY VIRUS PANEL
Adenovirus: NEGATIVE
INFLUENZA A: POSITIVE — AB
INFLUENZA B 1: NEGATIVE
METAPNEUMOVIRUS: NEGATIVE
PARAINFLUENZA 1 A: NEGATIVE
PARAINFLUENZA 3 A: NEGATIVE
Parainfluenza 2: NEGATIVE
Respiratory Syncytial Virus A: NEGATIVE
Respiratory Syncytial Virus B: NEGATIVE
Rhinovirus: NEGATIVE

## 2015-10-07 LAB — PREALBUMIN: PREALBUMIN: 7.3 mg/dL — AB (ref 18–38)

## 2015-10-07 LAB — RETICULOCYTES
RBC.: 2.57 MIL/uL — AB (ref 4.22–5.81)
RETIC CT PCT: 3.3 % — AB (ref 0.4–3.1)
Retic Count, Absolute: 84.8 10*3/uL (ref 19.0–186.0)

## 2015-10-07 LAB — TRIGLYCERIDES: Triglycerides: 192 mg/dL — ABNORMAL HIGH (ref <150)

## 2015-10-07 LAB — FERRITIN
Ferritin: 226 ng/mL (ref 24–336)
Ferritin: 227 ng/mL (ref 24–336)

## 2015-10-07 LAB — FOLATE: FOLATE: 6 ng/mL (ref >5.9)

## 2015-10-07 LAB — MAGNESIUM: MAGNESIUM: 1.9 mg/dL (ref 1.7–2.4)

## 2015-10-07 LAB — APTT: APTT: 45 s — AB (ref 24–37)

## 2015-10-07 LAB — VITAMIN B12: VITAMIN B 12: 362 pg/mL (ref 180–914)

## 2015-10-07 MED ORDER — METOPROLOL TARTRATE 25 MG PO TABS
25.0000 mg | ORAL_TABLET | Freq: Two times a day (BID) | ORAL | Status: DC
  Administered 2015-10-07 (×2): 25 mg via ORAL
  Filled 2015-10-07 (×2): qty 1

## 2015-10-07 MED ORDER — VANCOMYCIN HCL 10 G IV SOLR: 1500.0000 mg | INTRAVENOUS | Status: DC

## 2015-10-07 MED ORDER — SODIUM CHLORIDE 0.9 % IV SOLN: 500.0000 mg | Freq: Three times a day (TID) | INTRAVENOUS | Status: DC

## 2015-10-07 MED ORDER — HEPARIN (PORCINE) IN NACL 100-0.45 UNIT/ML-% IJ SOLN: 1700.0000 [IU]/h | INTRAMUSCULAR | Status: DC

## 2015-10-07 MED ORDER — HEPARIN SODIUM (PORCINE) 5000 UNIT/ML IJ SOLN: 5000.0000 [IU] | Freq: Three times a day (TID) | INTRAMUSCULAR | Status: DC

## 2015-10-07 MED ORDER — MUPIROCIN CALCIUM 2 % EX CREA
TOPICAL_CREAM | Freq: Two times a day (BID) | CUTANEOUS | Status: DC
  Administered 2015-10-07 – 2015-10-08 (×2): via TOPICAL
  Filled 2015-10-07: qty 15

## 2015-10-07 MED ORDER — OXYCODONE HCL 5 MG PO TABS
5.0000 mg | ORAL_TABLET | ORAL | Status: DC | PRN
Start: 2015-10-07 — End: 2015-10-08

## 2015-10-07 MED ORDER — OSELTAMIVIR PHOSPHATE 75 MG PO CAPS
75.0000 mg | ORAL_CAPSULE | Freq: Two times a day (BID) | ORAL | Status: DC
  Administered 2015-10-07: 75 mg via ORAL
  Filled 2015-10-07: qty 1

## 2015-10-07 MED ORDER — INSULIN REGULAR HUMAN 100 UNIT/ML IJ SOLN: 2.0000 [IU] | Freq: Four times a day (QID) | INTRAMUSCULAR | Status: AC | PRN

## 2015-10-07 MED ORDER — HEPARIN (PORCINE) IN NACL 100-0.45 UNIT/ML-% IJ SOLN
1800.0000 [IU]/h | INTRAMUSCULAR | Status: DC
  Administered 2015-10-07 (×2): 1700 [IU]/h via INTRAVENOUS
  Filled 2015-10-07: qty 250

## 2015-10-07 NOTE — Evaluation (Signed)
Physical Therapy Evaluation Patient Details Name: Richard Warner MRN: [id number] DOB: 01/01/1951 Today's Date: 10/07/2015   History of Present Illness  65 y.o. male with PMHx of HTN, pulmonary embolism, HLD, CAD, rectal cancer with prolonged hospital course and complications including diverting colonoscopy, urostomy, and abdominal wound VAC, abdominal fistula brought in by EMS who presents to the Emergency Department complaining of gradual onset fever tmax 101.9 from [place]  Pt presents with decreased muscle strength and endurance requiring +2 assist for mobility and max A for sitting balance. Pt will benefit from skilled PT services to address deficits and reduce burden of care.    Follow Up Recommendations SNF    Equipment Recommendations  None recommended by PT    Recommendations for Other Services       Precautions / Restrictions Precautions Precautions: Fall Restrictions Weight Bearing Restrictions: No      Mobility  Bed Mobility Overal bed mobility: Needs Assistance;+2 for physical assistance Bed Mobility: Supine to Sit;Sit to Supine     Supine to sit: +2 for physical assistance Sit to supine: +2 for physical assistance   General bed mobility comments: pt with severe weakness in trunk and all extremities, requires total A for supine <> sit despite use of grab bars and HOB elevated, pt requires increased time and encouragement  Transfers                    Ambulation/Gait                Stairs            Wheelchair Mobility    Modified Rankin (Stroke Patients Only)       Balance Overall balance assessment: Needs assistance Sitting-balance support: Bilateral upper extremity supported;Feet supported Sitting balance-Leahy Scale: Poor Sitting balance - Comments: pt able to sit with mod/max A for 10 minutes with HR 115bpm up from 105bpm resting. pt with posterior lean, unable to sit without UE support, unable  to come forward due to abdominal weakness, requires max A to correct LOB in sitting Postural control: Posterior lean                                   Pertinent Vitals/Pain Pain Assessment: Faces Faces Pain Scale: Hurts little more Pain Location: shoulders with mobility Pain Descriptors / Indicators: Grimacing Pain Intervention(s): Limited activity within patient's tolerance;Monitored during session;Repositioned    Home Living Family/patient expects to be discharged to:: Skilled nursing facility                      Prior Function Level of [place]: Needs assistance         Comments: pt with long course of hospitalizations, was most recently at [facility]     Hand Dominance        Extremity/Trunk Assessment   Upper Extremity Assessment: Generalized weakness           Lower Extremity Assessment: Generalized weakness      Cervical / Trunk Assessment: Kyphotic  Communication   Communication: No difficulties  Cognition Arousal/Alertness: Awake/alert Behavior During Therapy: Anxious Overall Cognitive Status: Difficult to assess (pt with delayed responses, needs simple cues for activites)                      General Comments General comments (skin integrity, edema, etc.): discoloration B LEs  below knees, dry skin    Exercises        Assessment/Plan    PT Assessment Patient needs continued PT services  PT Diagnosis Difficulty walking;Generalized weakness   PT Problem List Decreased strength;Decreased knowledge of use of DME;Decreased activity tolerance;Decreased balance;Decreased mobility;Cardiopulmonary status limiting activity;Pain  PT Treatment Interventions DME instruction;Gait training;Functional mobility training;Therapeutic activities;Therapeutic exercise;Balance training;Neuromuscular re-education;Patient/family education;Wheelchair mobility training;Modalities   PT Goals (Current goals can be found in the  Care Plan section) Acute Rehab PT Goals Patient Stated Goal: get stronger PT Goal Formulation: With patient/family Time For Goal Achievement: 10/28/15 Potential to Achieve Goals: Fair    Frequency Min 2X/week   Barriers to discharge        Co-evaluation               End of Session   Activity Tolerance: Patient limited by fatigue Patient left: in bed;with call bell/phone within reach;with bed alarm set;with family/visitor present;with nursing/sitter in room Nurse Communication: Mobility status         Time: 1310-1345 PT Time Calculation (min) (ACUTE ONLY): 35 min   Charges:   PT Evaluation $PT Eval High Complexity: 1 Procedure PT Treatments $Therapeutic Activity: 23-37 mins   PT G Codes:        Anhelica Fowers Nov 03, 2015, 1:54 PM

## 2015-10-07 NOTE — Progress Notes (Signed)
ANTICOAGULATION CONSULT NOTE - Initial Consult  Pharmacy Consult for heparin Indication: recurrent pulmonary embolus/DVT  No Known Allergies  Patient Measurements: Height: 5\' 11"  (180.3 cm) Weight: 299 lb 9.7 oz (135.9 kg) IBW/kg (Calculated) : 75.3 Heparin Dosing Weight: 106.1kg  Vital Signs: Temp: 97.5 F (36.4 C) (03/03 0500) Temp Source: Oral (03/03 0500) BP: 109/67 mmHg (03/03 0500) Pulse Rate: 111 (03/03 0500)  Labs:  Recent Labs  10/06/15 0149 10/06/15 2027 10/07/15 0651  HGB 7.3* 7.3* 7.6*  HCT 23.9* 23.4* 23.2*  PLT 226 197 193  APTT  --   --  45*  LABPROT  --   --  16.4*  INR  --   --  1.31  CREATININE 1.77* 1.70* 1.58*    Estimated Creatinine Clearance: 65.6 mL/min (by C-G formula based on Cr of 1.58).   Medical History: Past Medical History  Diagnosis Date  . Hypertension   . Pulmonary embolism ([place]) 12-03-11    01-17-10 s/p DVT left leg -Coumadin tx.until 08-28-11  . Hyperlipidemia   . Leg swelling 12-03-11    bilateral if up most of day,equally swells, up to knee level, improved now  . Fractures 12-03-11    s/p right ankle and right wrist- no problems now  . Hypothyroidism   . Dyslipidemia   . Anemia 12-03-11    tx. oral iron supplement  . Arthritis 12-03-11    mild lower back  . Malignant neoplasm of sigmoid (flexure) ([place]) 10/08/2011  . 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 Chronic kidney disease 12-03-11    kidney stone x1 ,none recent stage III  . Secondary hyperparathyroidism, renal ([place])   . Chronic anticoagulation   . Shortness of breath 12-03-11    hx. 6'11- during pulmonary emboli episode, none now, extreme exertion only.  . Morbid obesity ([place])   . Vitamin D deficiency   . Chronic pulmonary edema   . Dorsalgia   . Acute MI, true posterior wall, initial episode of care ([place]) 02/27/2015   Assessment: 78  yom with hx of recurrent PE/DVT, on SQ heparin for VTE tx at [facility]. Started on hep SQ ppx dosing here, last dose at 0532 this AM. Now to start heparin IV per pharmacy with no bolus and lower goal due to hx of recurrent bleed from wound vac (noted "more than normal" on admit, currently none reported). To transfer to [facility] (follows with Oncology) when bed available. Hg 7.6 stable, plt wnl.  Goal of Therapy:  Heparin level 0.3-0.5 units/ml (bleed risk) Monitor platelets by anticoagulation protocol: Yes   Plan:  D/c SQ heparin Heparin at 1700 units/h (start conservatively, no bolus) 6h HL, daily HL/CBC Monitor s/sx bleeding closely  Elicia Lamp, PharmD, [facility] Clinical Pharmacist Pager [phone] 10/07/2015 10:49 AM

## 2015-10-07 NOTE — Discharge Summary (Signed)
Physician Discharge Summary  Richard Warner [id number] DOB: 20-Jan-1951 DOA: 10/05/2015  PCP: Rachell Cipro, MD  Admit date: 10/05/2015 Discharge date: 10/07/2015  Time spent: 35 minutes  Recommendations for Outpatient Follow-up:  1. Pt will be transfer to [place]. 2. He had 1/2 BC with enterococcus. 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   Open wnd anterior abdomen   Enterocutaneous fistula   Enterococcal bacteremia   Discharge Condition: Guarded  Diet recommendation: NPO  Filed Weights   10/06/15 0115 10/07/15 0500  Weight: 134 kg (295 lb 6.7 oz) 135.9 kg (299 lb 9.7 oz)    History of present illness:  65 year old male patient with known history of DVT with recurrent PE, OSA not on CPAP, morbid obesity, hypertension, dyslipidemia, hypothyroidism, CAD status post drug-eluting stent after MI July 2016, recurrence rectal cancer 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 2 days postop patient developed rectal dehiscence and underwent operative repair and was left with an open abdominal wound with wound VAC and multiple drains. Initial surgical treatment appeared to have repaired the dehiscence but he had an additional anastomotic leak and developed a enteric cutaneous fistula. He now remains NPO except for meds and is on chronic TPN. During that hospitalization he had a Pseudomonas UTI that was resistant to Levaquin. He also was found to have MRSA bacteremia that was initially treated with  vancomycin and subsequently treated with daptomycin and Ceftaroline. He underwent a TEE during which revealed no evidence of vegetations or other abnormalities. Hewas discharged to skilled nursing facility in [place] (about 2 weeks ago). Patient reports that since discharge she has "felt worse and worse". He reports that he has not been getting out of bed to have PT and OT because of "insurance issues". He has not had any nausea or vomiting. He has chronic left lower quadrant abdominal pain since surgical procedure. He was found to have a temperature of 101.9 at the nursing facility and thus was sent to the ER for evaluation. Apparently there was more blood than typical being aspirated by the wound VAC (reported by staff to EMS).  Hospital Course:  Sepsis due to Enteroccocus bacteremia 1/2 blood culture: BP has remains stable mildly tachycardic, had a mild fever overnight respondedn to fluid resuscitation. Metoprolol was held on admission which was then restarted. Lactate < 1.0. Pt is on Vanc and imipenem, will consult ID. Who agree with abx coverage. Would recommend to change central line once we know the repeated blood culture are negative. CT scan of the abdomen and pelvis was done which shows an enlarge fluid collection compare to previous ct done at [place].  Rectal cancer, pT3pN0 (0/12 LN) h/o LAR and chemorad; recurred Nov 2016 s/p total cystectomy/Open wnd anterior abdomen/Entero-enteric fistula. Pt is NPO cont TPN. He is NDR /DNI. CT abd and pelvis confirmed EC fistula with adjacent mass and walled fluid collection, which was present on previous ct scan done at [facility], but it seem bigger compare to  previous ct done at [place]. Burnis Medin continue empiric treatment for now, his wound VAC is not purulent.  Essential hypertension: Blood pressure is stable, resume metoprolol.  History of DVT and recurrent PE: Continue IV heparin.  Chronic kidney disease stage III: On admission  had mildly elevated of cr., it has now return to a baseline of 1.5.  CAD with MI in July 2016 status post DES: Telemetry chest pain-free, during his previous admission at [facility] 2 weeks ago he had trouble with recurrent anemia requiring multiple blood transfusions and his Plavix was DC'd during this admission and he was placed on low-dose aspirin, beta blocker and statins which we are continuing. Con to monitor Hbg.  Hypothyroidism: Continue Synthroid.  HLD (hyperlipidemia) Continue statins.  Obesity, Class III, BMI 40-49.9 (morbid obesity) ([facility]) Currently 295 pounds  Iron deficiency anemia: Continue oral iron, due to his history of coronary artery sees this transfuse 1 unit of packed blood cells in the ED. Patient is known to have recurrent bleeding through his wound VAC which at this point in time he is not having, he did have a FOBT positive on rectal exam by the EDP.  Procedures:  Ct abd and pelivs  CXR  Consultations:  ID  Discharge Exam: Filed Vitals:   10/07/15 0500 10/07/15 1618  BP: 109/67   Pulse: 111   Temp: 97.5 F (36.4 C) 98.6 F (37 C)  Resp:      General: A&O x3 Cardiovascular: RRR Respiratory: Good air movement CTA B/L  Discharge Instructions   Discharge Instructions    Diet - low sodium heart healthy    Complete by:  As directed      Increase activity slowly    Complete by:  As directed           Current Discharge Medication List    START taking these medications   Details  heparin 100-0.45 UNIT/ML-% infusion Inject 1,700 Units/hr into the vein continuous. Qty: 250 mL, Refills: 0    imipenem-cilastatin 500 mg in sodium chloride 0.9 % 100 mL Inject 500 mg into the vein every 8 (eight) hours.    vancomycin 1,500 mg in sodium chloride 0.9 % 500 mL Inject 1,500 mg into the vein daily.      CONTINUE these medications which have CHANGED   Details  insulin regular (NOVOLIN R,HUMULIN R) 100 units/mL injection Inject 0.02 mLs (2  Units total) into the skin every 6 (six) hours as needed for high blood sugar (CBG is greater than 150). Qty: 10 mL, Refills: 11      CONTINUE these medications which have NOT CHANGED   Details  aspirin EC 81 MG tablet Take 1 tablet (81 mg total) by mouth daily. Qty: 90 tablet, Refills: 3    atorvastatin (LIPITOR) 80 MG tablet Take 1 tablet (80 mg total) by mouth daily at 6 PM. Qty: 30 tablet, Refills: 5    Cholecalciferol (VITAMIN D3) 5000 UNITS TABS Take 5,000 Units by mouth daily after supper.     famotidine (PEPCID) 20 MG tablet Take 20 mg by mouth daily.    ferrous sulfate 325 (65 FE) MG tablet Take 1 tablet (325 mg total) by mouth 3 (three) times daily with meals. Qty: 90 tablet, Refills: 3   Associated Diagnoses: Gross hematuria    levothyroxine (SYNTHROID, LEVOTHROID) 88 MCG tablet Take 88 mcg by mouth daily before breakfast.    metoprolol tartrate (LOPRESSOR) 25 MG tablet Take 1 tablet (25 mg total) by mouth  2 (two) times daily. Qty: 60 tablet, Refills: 8   Associated Diagnoses: Hyperlipidemia; Coronary artery disease involving native coronary artery of native heart without angina pectoris    mirtazapine (REMERON) 15 MG tablet Take 15 mg by mouth at bedtime.    nitroGLYCERIN (NITROSTAT) 0.4 MG SL tablet Place 0.4 mg under the tongue every 5 (five) minutes as needed for chest pain (3 doses MAX).    oxyCODONE (OXY IR/ROXICODONE) 5 MG immediate release tablet Take 5 mg by mouth every 4 (four) hours as needed for severe pain.    tetrahydrozoline 0.05 % ophthalmic solution Place 2 drops into both eyes every 8 (eight) hours as needed (dry eyes).      STOP taking these medications     heparin 1000 UNIT/ML injection        No Known Allergies    The results of significant diagnostics from this hospitalization (including imaging, microbiology, ancillary and laboratory) are listed below for reference.    Significant Diagnostic Studies: Ct Abdomen Pelvis Wo  Contrast  10/06/2015  CLINICAL DATA:  Acute onset of generalized abdominal pain and fever. Blood noted within the abdominal wound vac. Initial encounter. EXAM: CT ABDOMEN AND PELVIS WITHOUT CONTRAST TECHNIQUE: Multidetector CT imaging of the abdomen and pelvis was performed following the standard protocol without IV contrast. COMPARISON:  CT of the abdomen and pelvis from 08/28/2011, and PET/CT performed 12/27/2014 FINDINGS: A trace right pleural effusion is noted. Bibasilar atelectasis is noted. Diffuse coronary artery calcifications are seen. The liver is unremarkable in appearance. The spleen is enlarged, measuring 15.4 cm in length. The gallbladder is mildly distended, containing a small stone and vicarious contrast excretion. It is otherwise grossly unremarkable. The pancreas and adrenal glands are unremarkable. The kidneys are unremarkable in appearance. There is no evidence of hydronephrosis. No renal or ureteral stones are seen. Minimal bilateral perinephric stranding is noted. The small bowel is unremarkable in appearance. The stomach is within normal limits. No acute vascular abnormalities are seen. Minimal calcification is noted along the abdominal aorta and its branches. The patient's left lower quadrant colostomy is decompressed and unremarkable in appearance. The right lower quadrant urostomy is also grossly unremarkable, aside from mild surrounding inflammation. The patient is status post cystectomy. Marked irregular soft tissue density is noted within the pelvis, surrounding a collection of complex fluid and trace air measuring approximately 6.9 x 4.5 x 5.5 cm. This appears contiguous with the inferior aspect of the patient's anterior abdominal dehiscence. On clinical correlation, there appears to be leakage of liquid stool and blood from the abdominal wound, compatible with previously suspected fistulous connection to the adjacent small bowel, abscess formation, and likely surrounding recurrent  mass. Diffuse surrounding soft tissue inflammation is noted within the pelvis, tracking superiorly along the mesentery. The patient's tiny residual Hartmann's pouch is decompressed, though it may communicate with the complex pelvic abscess, given bleeding from the rectum. No inguinal lymphadenopathy is seen. A small left inguinal hernia is noted, containing only fat. No acute osseous abnormalities are identified. IMPRESSION: 1. Marked irregular soft tissue density within the pelvis, surrounding a collection of complex fluid and trace air measuring approximately 6.9 x 4.5 x 5.5 cm. This appears contiguous with the inferior aspect of the patient's anterior abdominal dehiscence. On clinical correlation, there is leakage of liquid stool and blood from the abdominal wound, compatible with previously suspected fistulous connection to the adjacent small bowel, abscess formation, and likely surrounding recurrent mass. 2. Diffuse surrounding soft tissue inflammation within the pelvis  tracks superiorly along the mesentery. 3. Tiny residual Hartmann's pouch is decompressed, though it may communicate with the complex pelvic abscess, given bleeding from the rectum. 4. Small left inguinal hernia, containing only fat. 5. Trace right pleural effusion.  Bibasilar atelectasis noted. 6. Diffuse coronary artery calcifications seen. 7. Splenomegaly noted. 8. Cholelithiasis.  Gallbladder otherwise unremarkable. These results were called by telephone at the time of interpretation on 10/06/2015 at 5:07 am to Dr. Thayer Jew, who verbally acknowledged these results. Electronically Signed   By: Garald Balding M.D.   On: 10/06/2015 05:14   Dg Chest Portable 1 View  10/06/2015  CLINICAL DATA:  Fever for 2 days. History of pulmonary embolism, hypertension, shortness of breath. EXAM: PORTABLE CHEST 1 VIEW COMPARISON:  Chest radiograph February 27, 2015 FINDINGS: The cardiac silhouette appears moderately enlarged. Mediastinal silhouette is  nonsuspicious. Low inspiratory examination with bibasilar strandy densities. No pleural effusion. No pneumothorax. RIGHT tunnel probable catheter terminates at cavoatrial junction. Large body habitus. Osseous structures are nonsuspicious. Mild degenerative change of the thoracic spine. IMPRESSION: Similar cardiomegaly. Bibasilar atelectasis in this low inspiratory examination. Electronically Signed   By: Elon Alas M.D.   On: 10/06/2015 00:30    Microbiology: Recent Results (from the past 240 hour(s))  Blood Culture (routine x 2)     Status: None (Preliminary result)   Collection Time: 10/06/15 12:05 AM  Result Value Ref Range Status   Specimen Description BLOOD PICC LINE  Final   Special Requests BOTTLES DRAWN AEROBIC AND ANAEROBIC 10CC  Final   Culture  Setup Time   Final    GRAM POSITIVE COCCI IN PAIRS AND CHAINS IN BOTH AEROBIC AND ANAEROBIC BOTTLES CRITICAL RESULT CALLED TO, READ BACK BY AND VERIFIED WITH: H MORRISON,RN AT 1401 10/06/15 BY L BENFIELD    Culture ENTEROCOCCUS SPECIES  Final   Report Status PENDING  Incomplete  Blood Culture (routine x 2)     Status: None (Preliminary result)   Collection Time: 10/06/15 12:18 AM  Result Value Ref Range Status   Specimen Description BLOOD LEFT HAND  Final   Special Requests BOTTLES DRAWN AEROBIC ONLY [place]  Final   Culture NO GROWTH 1 DAY  Final   Report Status PENDING  Incomplete  Urine culture     Status: None   Collection Time: 10/06/15 12:53 AM  Result Value Ref Range Status   Specimen Description URINE, RANDOM  Final   Special Requests NONE  Final   Culture MULTIPLE SPECIES PRESENT, SUGGEST RECOLLECTION  Final   Report Status 10/07/2015 FINAL  Final  MRSA PCR Screening     Status: Abnormal   Collection Time: 10/06/15  8:13 PM  Result Value Ref Range Status   MRSA by PCR POSITIVE (A) NEGATIVE Final    Comment:        The GeneXpert MRSA Assay (FDA approved for NASAL specimens only), is one component of a comprehensive  MRSA colonization surveillance program. It is not intended to diagnose MRSA infection nor to guide or monitor treatment for MRSA infections. RESULT CALLED TO, READ BACK BY AND VERIFIED WITH: S.COOPER,RN AT 2139 BY L.PITT 10/06/15      Labs: Basic Metabolic Panel:  Recent Labs Lab 10/06/15 0005 10/06/15 0149 10/06/15 1223 10/06/15 2027 10/07/15 0651  NA 123* 135  --  131* 135  K >7.5* 4.3  --  5.5* 4.3  CL 90* 107  --  103 106  CO2 10* 17*  --  14* 19*  GLUCOSE 2328* 183*  --  740* 175*  BUN 38* 48*  --  43* 37*  CREATININE 1.69* 1.77*  --  1.70* 1.58*  CALCIUM 6.9* 8.2* 8.1* 8.3* 8.2*  MG  --   --  2.0 2.1 1.9  PHOS  --   --  3.0  --  3.1   Liver Function Tests:  Recent Labs Lab 10/06/15 0005 10/06/15 0149 10/06/15 2027 10/07/15 0651  AST 52* 55* 41 45*  ALT 63 85* 59 59  ALKPHOS 147* 254* 187* 215*  BILITOT 0.5 0.6 0.6 0.3  PROT 4.4* 6.1* 5.4* 5.4*  ALBUMIN 1.1* 1.5* 1.3* 1.3*   No results for input(s): LIPASE, AMYLASE in the last 168 hours. No results for input(s): AMMONIA in the last 168 hours. CBC:  Recent Labs Lab 10/06/15 0005 10/06/15 0149 10/06/15 2027 10/07/15 0651  WBC QUESTIONABLE RESULTS, RECOMMEND RECOLLECT TO VERIFY 4.1 3.2* 3.5*  NEUTROABS QUESTIONABLE RESULTS, RECOMMEND RECOLLECT TO VERIFY 3.1 2.4 2.6  HGB 6.4* 7.3* 7.3* 7.6*  HCT 22.2* 23.9* 23.4* 23.2*  MCV 113.8* 89.8 94.0 90.3  PLT QUESTIONABLE RESULTS, RECOMMEND RECOLLECT TO VERIFY 226 197 193   Cardiac Enzymes: No results for input(s): CKTOTAL, CKMB, CKMBINDEX, TROPONINI in the last 168 hours. BNP: BNP (last 3 results)  Recent Labs  02/27/15 0945 03/11/15 1710  BNP 165.3* 58.2    ProBNP (last 3 results) No results for input(s): PROBNP in the last 8760 hours.  CBG:  Recent Labs Lab 10/07/15 0022 10/07/15 0438 10/07/15 0736 10/07/15 1111 10/07/15 1615  GLUCAP 153* 170* 174* 190* 137*       Signed:  Charlynne Cousins MD.  [facility] 10/07/2015, 6:52 PM

## 2015-10-07 NOTE — Progress Notes (Signed)
Initial Nutrition Assessment  DOCUMENTATION CODES:   Morbid obesity  INTERVENTION:    Resume TPN as able with dosing per Pharmacy to meet nutrition needs as able.  NUTRITION DIAGNOSIS:   Inadequate oral intake related to altered GI function as evidenced by NPO status.  GOAL:   Patient will meet greater than or equal to 90% of their needs  MONITOR:   Labs, Weight trends, Skin, I & O's  REASON FOR ASSESSMENT:   Other (Comment) (New TPN)    ASSESSMENT:   65 y.o. male with a Past Medical History of HNT, PE, HLD, anemia, recurrent Colon/bladder cancers, CKD, MI, enterocutaneous fistula, who presents with Sepsis awaiting transfer to [facility].   CT abd and pelvis confirmed EC fistula with adjacent mass and walled fluid collection. Wound VAC in place. Patient is at nutrition risk due to [facility] fistula with recent 16% weight loss over the past 6 months.  Unable to complete Nutrition-Focused physical exam at this time.  TPN on hold today for line holiday due to enterococcus bacteremia. Home cyclic TPN provided [id number] kcals and 190 gm protein. Plans to transition to continuous 24 hour TPN with Clinimix E 5/15.  Plans for transfer to [facility] when bed available.  Diet Order:  Diet NPO time specified Except for: Sips with Meds, Ice Chips TPN (CLINIMIX-E) Adult  Skin:  Wound (see comment) (skin tear to hand&leg; wounds to buttocks&hip; VAC to abd)  Last BM:  unknown (colostomy in place)  Height:   Ht Readings from Last 1 Encounters:  10/06/15 5\' 11"  (1.803 m)    Weight:   Wt Readings from Last 1 Encounters:  10/07/15 299 lb 9.7 oz (135.9 kg)    Ideal Body Weight:  78.2 kg  BMI:  Body mass index is 41.8 kg/(m^2).  Estimated Nutritional Needs:   Kcal:  2200-2400  Protein:  160-180 gm  Fluid:  >/= 2.4 L  EDUCATION NEEDS:   No education needs identified at this time  Molli Barrows, [place], [place], [place] Pager [phone] After Hours Pager [phone]

## 2015-10-07 NOTE — Progress Notes (Signed)
[facility] for heparin Indication: recurrent pulmonary embolus/DVT  No Known Allergies  Patient Measurements: Height: 5\' 11"  (180.3 cm) Weight: 299 lb 9.7 oz (135.9 kg) IBW/kg (Calculated) : 75.3 Heparin Dosing Weight: 106.1kg  Vital Signs: Temp: 98.5 F (36.9 C) (03/03 1947) Temp Source: Oral (03/03 1947) BP: 114/61 mmHg (03/03 1947) Pulse Rate: 106 (03/03 1947)  Labs:  Recent Labs  10/06/15 0149 10/06/15 2027 10/07/15 0651 10/07/15 1847  HGB 7.3* 7.3* 7.6*  --   HCT 23.9* 23.4* 23.2*  --   PLT 226 197 193  --   APTT  --   --  45*  --   LABPROT  --   --  16.4*  --   INR  --   --  1.31  --   HEPARINUNFRC  --   --   --  0.29*  CREATININE 1.77* 1.70* 1.58*  --     Estimated Creatinine Clearance: 65.6 mL/min (by C-G formula based on Cr of 1.58).   Medical History: Past Medical History  Diagnosis Date  . Hypertension   . Pulmonary embolism ([place]) 12-03-11    01-17-10 s/p DVT left leg -Coumadin tx.until 08-28-11  . Hyperlipidemia   . Leg swelling 12-03-11    bilateral if up most of day,equally swells, up to knee level, improved now  . Fractures 12-03-11    s/p right ankle and right wrist- no problems now  . Hypothyroidism   . Dyslipidemia   . Anemia 12-03-11    tx. oral iron supplement  . Arthritis 12-03-11    mild lower back  . 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 Chronic kidney disease 12-03-11    kidney stone x1 ,none recent stage III  . Secondary hyperparathyroidism, renal ([place])   . Chronic anticoagulation   . Shortness of breath 12-03-11    hx. 6'11- during pulmonary emboli episode, none now, extreme exertion only.  . Morbid obesity ([place])   . Vitamin D deficiency   . Chronic pulmonary edema   . Dorsalgia   . Acute MI, true posterior  wall, initial episode of care ([place]) 02/27/2015   Assessment: 68 yom with hx of recurrent PE/DVT, on SQ heparin for VTE tx at [facility]. Started on hep SQ ppx dosing here, last dose at 0532 this AM. Now to start heparin IV per pharmacy with no bolus and lower goal due to hx of recurrent bleed from wound vac (noted "more than normal" on admit, currently none reported). To transfer to [facility] (follows with Oncology) when bed available. Hg 7.6 stable, plt wnl.  Heparin level is 0.29, slightly subtherapeutic, will increase and check another heparin level  Goal of Therapy:  Heparin level 0.3-0.5 units/ml (bleed risk) Monitor platelets by anticoagulation protocol: Yes   Plan:  Increase Heparin to 1800 units/h (increasing conservatively, no bolus) 6h HL, daily HL/CBC Monitor s/sx bleeding closely  Thank you for allowing [place] to participate in this patients care. Jens Som, PharmD Pager: [phone]  10/07/2015 8:09 PM

## 2015-10-07 NOTE — Progress Notes (Addendum)
[facility] PROGRESS NOTE    Progress Note   Richard Warner [id number] DOB: 08/17/1950 DOA: 10/05/2015 PCP: Rachell Cipro, MD   Brief Narrative:   Richard Warner is an 65 y.o. male with a Past Medical History of HNT, PE, HLD, anemia, recurrent Colon/bladder cancers, CKD, MI, enterocutaneous fistula, who presents with Sepsis awaiting transfer to [facility]. Broad ABX. 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 Influenza PCR was positive he is on tamiflu. Lactate < 1.0. Pt is on imipenem, will consult ID.  Rectal cancer, pT3pN0 (0/12 LN) h/o LAR and chemorad; recurred Nov 2016 s/p total cystectomy/Open wnd anterior abdomen/Entero-enteric fistula. Pt is NPO cont TPN. He is NDR /DNI. 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. He was treated empirically on vancomycin and imipenem, he has no leukocytosis thrombocytopenia, electrical solution 3. Blood Pressure is stable and he is mildly tachycardic (as he was normal on his metoprolol), is unlikely We'll continue empiric treatment for now, his wound VAC is not purulent.  Essential hypertension: Blood pressure is stable, resume metoprolol.  History of DVT and recurrent PE: Continue IV heparin which he was just returned from [facility] to the facility.  Chronic kidney disease stage III: Baseline creatinine from 1.5. He was mildly elevated on admission, it has now return to a baseline of 1.5.  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  Hypothyroidism: Continue Synthroid.  HLD (hyperlipidemia) Continue statins.  Obesity, Class III, BMI 40-49.9 (morbid obesity) (HCC) Currently 295 pounds  Iron deficiency anemia: Continue oral iron, due to his history of coronary artery sees this transfuse 1 unit of packed blood cells in the ED. Patient is known to have recurrent bleeding through his wound VAC which at this point in time he is not having, he did have a FOBT positive on rectal exam by the EDP. EDP spoke with the surgeon over it and see Gaspar Cola that the patient develop recurrent bleeding a colonoscopy would be recommended.    DVT Prophylaxis - Heparin  Family Communication: none Disposition Plan: Eating. Awaiting bed to [facility] at [facility] for transfer. Code Status:     Code Status Orders        Start     Ordered   10/06/15 1110  Do not attempt resuscitation (DNR)   Continuous    Question Answer Comment  In the event of cardiac or respiratory ARREST Do not call a "code blue"   In the event of cardiac or respiratory ARREST Do not perform Intubation, CPR, defibrillation or ACLS   In the event of cardiac or respiratory ARREST Use medication by any route, position, wound care, and other measures to relive pain and suffering. May use oxygen, suction and manual treatment of airway obstruction as needed for comfort.      10/06/15 1109    Code Status History    Date Active Date Inactive Code Status Order ID Comments User Context   10/06/2015  8:11 AM 10/06/2015 11:10  AM DNR [id number]  Samella Parr, NP ED   03/11/2015  4:17 PM 03/19/2015  4:48 PM Full Code [id number]  Burtis Junes, NP Inpatient   02/27/2015  3:07 PM 03/02/2015  5:16 PM Full Code [id number]  [name], MD Inpatient   02/27/2015  3:03 PM 02/27/2015  3:07 PM Full Code [id number]  [name], MD Inpatient   12/14/2011  4:45 PM 12/19/2011  6:38 PM Full Code [id number]  Deneen Harts Sison, RN Inpatient        IV Access:    Peripheral IV   Procedures and diagnostic studies:   Ct Abdomen Pelvis Wo Contrast  10/06/2015  CLINICAL DATA:  Acute onset of generalized abdominal pain and fever. Blood noted within the abdominal wound vac. Initial encounter. EXAM: CT ABDOMEN AND PELVIS WITHOUT CONTRAST TECHNIQUE: Multidetector CT imaging of the abdomen and pelvis was performed following the standard protocol without IV contrast. COMPARISON:  CT of the abdomen and pelvis from 08/28/2011, and PET/CT performed 12/27/2014 FINDINGS: A trace right pleural effusion is noted. Bibasilar atelectasis is noted. Diffuse coronary artery calcifications are seen. The liver is unremarkable in appearance. The spleen is enlarged, measuring 15.4 cm in length. The gallbladder is mildly distended, containing a small stone and vicarious contrast excretion. It is otherwise grossly unremarkable. The pancreas and adrenal glands are unremarkable. The kidneys are unremarkable in appearance. There is no evidence of hydronephrosis. No renal or ureteral stones are seen. Minimal bilateral perinephric stranding is noted. The small bowel is unremarkable in appearance. The stomach is within normal limits. No acute vascular abnormalities are seen. Minimal calcification is noted along the abdominal aorta and its branches. The patient's left lower quadrant colostomy is decompressed and unremarkable in appearance. The right lower quadrant urostomy is also grossly unremarkable, aside from mild surrounding inflammation. The patient is status post cystectomy. Marked irregular soft tissue density is noted within the pelvis, surrounding a collection of complex fluid and trace air measuring approximately 6.9 x 4.5 x 5.5 cm. This appears contiguous with the inferior aspect of the patient's anterior abdominal dehiscence. On clinical correlation, there appears to be leakage of liquid stool and blood from the abdominal wound,  compatible with previously suspected fistulous connection to the adjacent small bowel, abscess formation, and likely surrounding recurrent mass. Diffuse surrounding soft tissue inflammation is noted within the pelvis, tracking superiorly along the mesentery. The patient's tiny residual Hartmann's pouch is decompressed, though it may communicate with the complex pelvic abscess, given bleeding from the rectum. No inguinal lymphadenopathy is seen. A small left inguinal hernia is noted, containing only fat. No acute osseous abnormalities are identified. IMPRESSION: 1. Marked irregular soft tissue density within the pelvis, surrounding a collection of complex fluid and trace air measuring approximately 6.9 x 4.5 x 5.5 cm. This appears contiguous with the inferior aspect of the patient's anterior abdominal dehiscence. On clinical correlation, there is leakage of liquid stool and blood from the abdominal wound, compatible with previously suspected fistulous connection to the adjacent small bowel, abscess formation, and likely surrounding recurrent mass. 2. Diffuse surrounding soft tissue inflammation within the pelvis tracks superiorly along the mesentery. 3. Tiny residual Hartmann's pouch is decompressed, though it may communicate with the complex pelvic abscess, given bleeding from the rectum. 4. Small left inguinal hernia, containing only fat. 5. Trace right pleural effusion.  Bibasilar atelectasis noted. 6. Diffuse coronary artery calcifications seen. 7. Splenomegaly noted. 8. Cholelithiasis.  Gallbladder otherwise unremarkable. These results were called by telephone at  the time of interpretation on 10/06/2015 at 5:07 am to Dr. Thayer Jew, who verbally acknowledged these results. Electronically Signed   By: Garald Balding M.D.   On: 10/06/2015 05:14   Dg Chest Portable 1 View  10/06/2015  CLINICAL DATA:  Fever for 2 days. History of pulmonary embolism, hypertension, shortness of breath. EXAM: PORTABLE CHEST 1 VIEW  COMPARISON:  Chest radiograph February 27, 2015 FINDINGS: The cardiac silhouette appears moderately enlarged. Mediastinal silhouette is nonsuspicious. Low inspiratory examination with bibasilar strandy densities. No pleural effusion. No pneumothorax. RIGHT tunnel probable catheter terminates at cavoatrial junction. Large body habitus. Osseous structures are nonsuspicious. Mild degenerative change of the thoracic spine. IMPRESSION: Similar cardiomegaly. Bibasilar atelectasis in this low inspiratory examination. Electronically Signed   By: Elon Alas M.D.   On: 10/06/2015 00:30     Medical Consultants:    None.  Anti-Infectives:   Anti-infectives    Start     Dose/Rate Route Frequency Ordered Stop   10/07/15 0300  vancomycin (VANCOCIN) 1,500 mg in sodium chloride 0.9 % 500 mL IVPB     1,500 mg 250 mL/hr over 120 Minutes Intravenous Every 24 hours 10/06/15 0809     10/06/15 1615  oseltamivir (TAMIFLU) 6 MG/ML suspension 75 mg     75 mg Oral 2 times daily 10/06/15 1202 10/11/15 2159   10/06/15 0830  imipenem-cilastatin (PRIMAXIN) 500 mg in sodium chloride 0.9 % 100 mL IVPB     500 mg 200 mL/hr over 30 Minutes Intravenous Every 8 hours 10/06/15 0809     10/06/15 0300  piperacillin-tazobactam (ZOSYN) IVPB 3.375 g     3.375 g 100 mL/hr over 30 Minutes Intravenous  Once 10/06/15 0246 10/06/15 0534   10/06/15 0145  vancomycin (VANCOCIN) IVPB 1000 mg/200 mL premix  Status:  Discontinued     1,000 mg 200 mL/hr over 60 Minutes Intravenous  Once 10/06/15 0139 10/06/15 0142   10/06/15 0145  vancomycin (VANCOCIN) 2,500 mg in sodium chloride 0.9 % 500 mL IVPB     2,500 mg 250 mL/hr over 120 Minutes Intravenous  Once 10/06/15 0142 10/06/15 0429   10/06/15 0145  ceFEPIme (MAXIPIME) 2 g in dextrose 5 % 50 mL IVPB     2 g 100 mL/hr over 30 Minutes Intravenous  Once 10/06/15 0142 10/06/15 0229      Subjective:    [name] patient relates he has no new complaints. Still feel weak but  slightly improved than yesterday.  Objective:    Filed Vitals:   10/06/15 2054 10/07/15 0000 10/07/15 0400 10/07/15 0500  BP: 118/67 106/60 107/67 109/67  Pulse: 109 109 111 111  Temp: 98.6 F (37 C)   97.5 F (36.4 C)  TempSrc: Oral   Oral  Resp: 20 19    Height:      Weight:    135.9 kg (299 lb 9.7 oz)  SpO2: 97% 100% 100% 98%    Intake/Output Summary (Last 24 hours) at 10/07/15 0902 Last data filed at 10/07/15 0547  Gross per 24 hour  Intake    575 ml  Output   2050 ml  Net  -1475 ml   Filed Weights   10/06/15 0115 10/07/15 0500  Weight: 134 kg (295 lb 6.7 oz) 135.9 kg (299 lb 9.7 oz)    Exam: Gen:  NAD Cardiovascular:  RRR. Chest and lungs:   CTAB Abdomen:  Abdomen soft, NT/ND, + BS Extremities:  No edema   Data Reviewed:    Labs: Basic  Metabolic Panel:  Recent Labs Lab 10/06/15 0005 10/06/15 0149 10/06/15 1223 10/06/15 2027 10/07/15 0651  NA 123* 135  --  131* 135  K >7.5* 4.3  --  5.5* 4.3  CL 90* 107  --  103 106  CO2 10* 17*  --  14* 19*  GLUCOSE 2328* 183*  --  740* 175*  BUN 38* 48*  --  43* 37*  CREATININE 1.69* 1.77*  --  1.70* 1.58*  CALCIUM 6.9* 8.2* 8.1* 8.3* 8.2*  MG  --   --  2.0 2.1 1.9  PHOS  --   --  3.0  --  3.1   GFR Estimated Creatinine Clearance: 65.6 mL/min (by C-G formula based on Cr of 1.58). Liver Function Tests:  Recent Labs Lab 10/06/15 0005 10/06/15 0149 10/06/15 2027 10/07/15 0651  AST 52* 55* 41 45*  ALT 63 85* 59 59  ALKPHOS 147* 254* 187* 215*  BILITOT 0.5 0.6 0.6 0.3  PROT 4.4* 6.1* 5.4* 5.4*  ALBUMIN 1.1* 1.5* 1.3* 1.3*   No results for input(s): LIPASE, AMYLASE in the last 168 hours. No results for input(s): AMMONIA in the last 168 hours. Coagulation profile  Recent Labs Lab 10/07/15 0651  INR 1.31    CBC:  Recent Labs Lab 10/06/15 0005 10/06/15 0149 10/06/15 2027 10/07/15 0651  WBC QUESTIONABLE RESULTS, RECOMMEND RECOLLECT TO VERIFY 4.1 3.2* 3.5*  NEUTROABS QUESTIONABLE RESULTS,  RECOMMEND RECOLLECT TO VERIFY 3.1 2.4 2.6  HGB 6.4* 7.3* 7.3* 7.6*  HCT 22.2* 23.9* 23.4* 23.2*  MCV 113.8* 89.8 94.0 90.3  PLT QUESTIONABLE RESULTS, RECOMMEND RECOLLECT TO VERIFY 226 197 193   Cardiac Enzymes: No results for input(s): CKTOTAL, CKMB, CKMBINDEX, TROPONINI in the last 168 hours. BNP (last 3 results) No results for input(s): PROBNP in the last 8760 hours. CBG:  Recent Labs Lab 10/06/15 1959 10/06/15 2149 10/07/15 0022 10/07/15 0438 10/07/15 0736  GLUCAP 160* 135* 153* 170* 174*   D-Dimer: No results for input(s): DDIMER in the last 72 hours. Hgb A1c:  Recent Labs  10/06/15 1253  HGBA1C 6.0*   Lipid Profile:  Recent Labs  10/07/15 0651  TRIG 192*   Thyroid function studies:  Recent Labs  10/06/15 1253  TSH 3.178   Anemia work up:  Recent Labs  10/06/15 2027 10/06/15 2030 10/07/15 0651  VITAMINB12  --   --  362  FOLATE  --   --  6.0  FERRITIN 226  --  227  TIBC  --   --  146*  IRON  --   --  12*  RETICCTPCT  --  3.4* 3.3*   Sepsis Labs:  Recent Labs Lab 10/06/15 0005 10/06/15 0024 10/06/15 0149 10/06/15 0426 10/06/15 2027 10/07/15 0651  PROCALCITON  --   --  0.33  --   --   --   WBC QUESTIONABLE RESULTS, RECOMMEND RECOLLECT TO VERIFY  --  4.1  --  3.2* 3.5*  LATICACIDVEN  --  0.76  --  1.00  --   --    Microbiology Recent Results (from the past 240 hour(s))  Blood Culture (routine x 2)     Status: None (Preliminary result)   Collection Time: 10/06/15 12:05 AM  Result Value Ref Range Status   Specimen Description BLOOD PICC LINE  Final   Special Requests BOTTLES DRAWN AEROBIC AND ANAEROBIC 10CC  Final   Culture  Setup Time   Final    GRAM POSITIVE COCCI IN PAIRS AND CHAINS IN BOTH AEROBIC AND  ANAEROBIC BOTTLES CRITICAL RESULT CALLED TO, READ BACK BY AND VERIFIED WITH: H MORRISON,RN AT 1401 10/06/15 BY L BENFIELD    Culture ENTEROCOCCUS SPECIES  Final   Report Status PENDING  Incomplete  MRSA PCR Screening     Status:  Abnormal   Collection Time: 10/06/15  8:13 PM  Result Value Ref Range Status   MRSA by PCR POSITIVE (A) NEGATIVE Final    Comment:        The GeneXpert MRSA Assay (FDA approved for NASAL specimens only), is one component of a comprehensive MRSA colonization surveillance program. It is not intended to diagnose MRSA infection nor to guide or monitor treatment for MRSA infections. RESULT CALLED TO, READ BACK BY AND VERIFIED WITH: S.COOPER,RN AT 2139 BY L.PITT 10/06/15      Medications:   . aspirin EC  81 mg Oral Daily  . atorvastatin  80 mg Oral q1800  . cholecalciferol  5,000 Units Oral QPC supper  . famotidine (PEPCID) IV  20 mg Intravenous Q12H  . ferrous sulfate  325 mg Oral Q breakfast  . heparin subcutaneous  5,000 Units Subcutaneous 3 times per day  . imipenem-cilastatin  500 mg Intravenous Q8H  . insulin aspart  0-9 Units Subcutaneous 6 times per day  . levothyroxine  88 mcg Oral QAC breakfast  . mirtazapine  15 mg Oral QHS  . mupirocin cream   Topical BID  . oseltamivir  75 mg Oral BID  . sodium chloride flush  3 mL Intravenous Q12H  . vancomycin  1,500 mg Intravenous Q24H   Continuous Infusions: . Marland KitchenTPN (CLINIMIX-E) Adult 125 mL/hr at 10/06/15 1720   And  . fat emulsion 240 mL (10/06/15 1720)    Time spent: 25 min   LOS: 1 day   Charlynne Cousins  [facility] Pager [phone]  *Please refer to [email or web address], password [email or web address] to get updated schedule on who will round on this patient, as hospitalists switch teams weekly. If 7PM-7AM, please contact night-coverage at [email or web address], password [email or web address] for any overnight needs.  10/07/2015, 9:02 AM

## 2015-10-07 NOTE — Progress Notes (Signed)
Report given to [facility] RN at [facility] . Tel # [phone]. To call for ETA . IV team notified to follow-up on TPN and Lipid emulsion.

## 2015-10-07 NOTE — Consult Note (Signed)
[facility] for Infectious Disease    Date of Admission:  10/05/2015    Day 2 vancomycin        Day 2 imipenem        Day 2 oseltamivir       Reason for Consult: Fever and possible enterococcal bacteremia    Referring Physician: Dr. Marguarite Arbour  Principal Problem:   Sepsis [facility]) Active Problems:   Enterococcal bacteremia   Rectal cancer, pT3pN0 (0/12 LN) h/o LAR and chemorad; recurred Nov 2016 s/p total cystectomy   Open wnd anterior abdomen   Enterocutaneous fistula   Hypothyroidism   HLD (hyperlipidemia)   Obesity, Class III, BMI 40-49.9 (morbid obesity) ([place])   Essential hypertension   History of DVT and recurrent PE   RENAL DISEASE, CHRONIC, STAGE III   Iron deficiency anemia, unspecified   CAD 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 s/p DES   . aspirin EC  81 mg Oral Daily  . atorvastatin  80 mg Oral q1800  . cholecalciferol  5,000 Units Oral QPC supper  . famotidine (PEPCID) IV  20 mg Intravenous Q12H  . ferrous sulfate  325 mg Oral Q breakfast  . imipenem-cilastatin  500 mg Intravenous Q8H  . insulin aspart  0-9 Units Subcutaneous 6 times per day  . levothyroxine  88 mcg Oral QAC breakfast  . metoprolol tartrate  25 mg Oral BID  . mirtazapine  15 mg Oral QHS  . mupirocin cream   Topical BID  . oseltamivir  75 mg Oral BID  . sodium chloride flush  3 mL Intravenous Q12H  . vancomycin  1,500 mg Intravenous Q24H    Recommendations: 1. Continue vancomycin and imipenem 2. Repeat blood cultures 3. Discontinue oseltamivir and droplet precautions   Assessment: Mr. Esmaili developed fever and sepsis shortly after being discharged from [facility] after a lengthy and complicated hospitalization. One blood culture drawn on admission from his central line has grown enterococcus. This could represent true bacteremia but may simply be a contaminant. I am most concerned about the possibility of worsening pelvic abscess. His most recent CT scans done at [facility] did not give a measurement for the pelvic fluid collection but as best as I can tell the fluid collection seen on the CT scan yesterday has enlarged in size since his last scan. I favor continuing vancomycin and imipenem pending further observation and final culture results. He states that he is feeling better and it seems as though he is defervescing. He does not have an influenza-like illness. I will stop oseltamivir now and discontinue droplet precautions. It would probably be best to consider changing his central line once we know repeat blood cultures are negative. I would hold off on making any decision about echocardiography pending final culture results. I would certainly agree with considering transfer back to [facility] where he has received all of his surgical care.   HPI: DEZI BILL is a 65 y.o. 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 He had prior radiation therapy. He was admitted to [facility] on 06/19/2016. He underwent partial colon resection, bladder resection and ileal conduit formation. Postoperatively he had a failed anastomosis with leak and required a second surgery and colostomy. He was left with an open wound and an enterocutaneous fistula. Serial CT scan showed a pelvic fluid collection. He had persistent MRSA bacteremia from 07/17/2015 till 08/10/2015. He was initially treated with  vancomycin and then switched to a combination of daptomycin and ceftaroline. He was also treated for possible urinary tract infection due to a combination of staph aureus, enterococcus and Pseudomonas. All antibiotics were discontinued when he was discharged to a skilled nursing facility on 09/26/2015. He states that he was not able to get the care he needed while at the skilled nursing facility. His VAC wound dressing was not being changed as it should have been and one morning he woke up and his wound was leaking and his bed was soaked. He began to  feel worse and developed a fever of 101.9 leading to readmission here on 10/05/2015.   Review of Systems: Review of Systems  Constitutional: Positive for fever, chills, weight loss and malaise/fatigue. Negative for diaphoresis.  HENT: Negative for congestion and sore throat.   Respiratory: Negative for cough, sputum production and shortness of breath.   Cardiovascular: Negative for chest pain.  Gastrointestinal: Positive for abdominal pain. Negative for nausea, vomiting and diarrhea.       His chronic left lower quadrant pain is a little better over the past few weeks.  Musculoskeletal: Negative for myalgias and joint pain.  Skin: Negative for rash.  Neurological: Positive for weakness. Negative for headaches.    Past Medical History  Diagnosis Date  . Hypertension   . Pulmonary embolism ([place]) 12-03-11    01-17-10 s/p DVT left leg -Coumadin tx.until 08-28-11  . Hyperlipidemia   . Leg swelling 12-03-11    bilateral if up most of day,equally swells, up to knee level, improved now  . Fractures 12-03-11    s/p right ankle and right wrist- no problems now  . Hypothyroidism   . Dyslipidemia   . Anemia 12-03-11    tx. oral iron supplement  . Arthritis 12-03-11    mild lower back  . 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 Colon cancer ([place]) 12/14/11    Low anterior resection of mid/proximal tumor=invasive adenocarcinoma,invaing through the muscularis propria into pericolonic fatty tissue  . Chronic kidney disease 12-03-11    kidney stone x1 ,none recent stage III  . Secondary hyperparathyroidism, renal ([place])   . Chronic anticoagulation   . Shortness of breath 12-03-11    hx. 6'11- during pulmonary emboli episode, none now, extreme exertion only.  . Morbid obesity ([place])   . Vitamin D deficiency   . Chronic pulmonary edema   . Dorsalgia   . Acute MI, true posterior wall, initial episode of care [facility]) 02/27/2015    Social History  Substance Use Topics  . Smoking status:  Never Smoker   . Smokeless tobacco: Never Used  . Alcohol Use: No    Family History  Problem Relation Age of Onset  . Diabetes Father   . Heart disease Father   . Stroke Maternal Grandmother   . Stroke Maternal Grandfather   . Hypertension Paternal Grandfather   . Lung cancer Paternal Grandfather   . Cancer Paternal Grandfather    No Known Allergies  OBJECTIVE: Blood pressure 109/67, pulse 111, temperature 97.5 F (36.4 C), temperature source Oral, resp. rate 19, height 5\' 11"  (1.803 m), weight 299 lb 9.7 oz (135.9 kg), SpO2 98 %.  Physical Exam  Constitutional: He is oriented to person, place, and time.  He is lethargic but answers all questions appropriately. His aunt is at the bedside.  HENT:  Mouth/Throat: No oropharyngeal exudate.  Eyes: Conjunctivae are normal.  Cardiovascular: Normal rate and regular rhythm.   No murmur heard.  Pulmonary/Chest: Breath sounds normal. He has no wheezes. He has no rales.  He has a right subclavian central line that was placed at [facility].  Abdominal: Soft. There is no tenderness.  He has a left-sided colostomy, a right sided ileal conduit and a VAC dressing over a lower midline wound/fistula.  Neurological: He is oriented to person, place, and time.  Skin: No rash noted.  Psychiatric: Mood and affect normal.    Lab Results Lab Results  Component Value Date   WBC 3.5* 10/07/2015   HGB 7.6* 10/07/2015   HCT 23.2* 10/07/2015   MCV 90.3 10/07/2015   PLT 193 10/07/2015    Lab Results  Component Value Date   CREATININE 1.58* 10/07/2015   BUN 37* 10/07/2015   NA 135 10/07/2015   K 4.3 10/07/2015   CL 106 10/07/2015   CO2 19* 10/07/2015    Lab Results  Component Value Date   ALT 59 10/07/2015   AST 45* 10/07/2015   ALKPHOS 215* 10/07/2015   BILITOT 0.3 10/07/2015     Microbiology: Recent Results (from the past 240 hour(s))  Blood Culture (routine x 2)     Status: None (Preliminary result)   Collection Time:  10/06/15 12:05 AM  Result Value Ref Range Status   Specimen Description BLOOD PICC LINE  Final   Special Requests BOTTLES DRAWN AEROBIC AND ANAEROBIC 10CC  Final   Culture  Setup Time   Final    GRAM POSITIVE COCCI IN PAIRS AND CHAINS IN BOTH AEROBIC AND ANAEROBIC BOTTLES CRITICAL RESULT CALLED TO, READ BACK BY AND VERIFIED WITH: H MORRISON,RN AT 1401 10/06/15 BY L BENFIELD    Culture ENTEROCOCCUS SPECIES  Final   Report Status PENDING  Incomplete  Blood Culture (routine x 2)     Status: None (Preliminary result)   Collection Time: 10/06/15 12:18 AM  Result Value Ref Range Status   Specimen Description BLOOD LEFT HAND  Final   Special Requests BOTTLES DRAWN AEROBIC ONLY [place]  Final   Culture NO GROWTH 1 DAY  Final   Report Status PENDING  Incomplete  Urine culture     Status: None   Collection Time: 10/06/15 12:53 AM  Result Value Ref Range Status   Specimen Description URINE, RANDOM  Final   Special Requests NONE  Final   Culture MULTIPLE SPECIES PRESENT, SUGGEST RECOLLECTION  Final   Report Status 10/07/2015 FINAL  Final  MRSA PCR Screening     Status: Abnormal   Collection Time: 10/06/15  8:13 PM  Result Value Ref Range Status   MRSA by PCR POSITIVE (A) NEGATIVE Final    Comment:        The GeneXpert MRSA Assay (FDA approved for NASAL specimens only), is one component of a comprehensive MRSA colonization surveillance program. It is not intended to diagnose MRSA infection nor to guide or monitor treatment for MRSA infections. RESULT CALLED TO, READ BACK BY AND VERIFIED WITH: S.COOPER,RN AT 2139 BY L.PITT 10/06/15    CT abdomen and pelvis without contrast 10/06/2015  IMPRESSION: 1. Marked irregular soft tissue density within the pelvis, surrounding a collection of complex fluid and trace air measuring approximately 6.9 x 4.5 x 5.5 cm. This appears contiguous with the inferior aspect of the patient's anterior abdominal dehiscence. On clinical correlation, there is  leakage of liquid stool and blood from the abdominal wound, compatible with previously suspected fistulous connection to the adjacent small bowel, abscess formation, and likely surrounding recurrent mass. 2. Diffuse surrounding  soft tissue inflammation within the pelvis tracks superiorly along the mesentery. 3. Tiny residual Hartmann's pouch is decompressed, though it may communicate with the complex pelvic abscess, given bleeding from the rectum. 4. Small left inguinal hernia, containing only fat. 5. Trace right pleural effusion. Bibasilar atelectasis noted. 6. Diffuse coronary artery calcifications seen. 7. Splenomegaly noted. 8. Cholelithiasis. Gallbladder otherwise unremarkable.  These results were called by telephone at the time of interpretation on 10/06/2015 at 5:07 am to Dr. Thayer Jew, who verbally acknowledged these results.   Electronically Signed  By: Garald Balding M.D.  On: 10/06/2015 05:14  Michel Bickers, [facility] [phone] pager   [phone] cell 10/07/2015, 12:52 PM

## 2015-10-07 NOTE — Progress Notes (Signed)
PARENTERAL NUTRITION CONSULT NOTE - FOLLOW-UP  Pharmacy Consult for TPN Indication: Fistula  No Known Allergies  Patient Measurements: Height: 5\' 11"  (180.3 cm) Weight: 299 lb 9.7 oz (135.9 kg) IBW/kg (Calculated) : 75.3 Adjusted Body Weight: Usual Weight:   Vital Signs: Temp: 97.5 F (36.4 C) (03/03 0500) Temp Source: Oral (03/03 0500) BP: 109/67 mmHg (03/03 0500) Pulse Rate: 111 (03/03 0500) Intake/Output from previous day: 03/02 0701 - 03/03 0700 In: 575 [P.O.:240; Blood:335] Out: 2050 [Urine:2050] Intake/Output from this shift:    Labs:  Recent Labs  10/06/15 0149 10/06/15 2027 10/07/15 0651  WBC 4.1 3.2* 3.5*  HGB 7.3* 7.3* 7.6*  HCT 23.9* 23.4* 23.2*  PLT 226 197 193  APTT  --   --  45*  INR  --   --  1.31     Recent Labs  10/06/15 0149 10/06/15 1223 10/06/15 2027 10/06/15 2030 10/07/15 0651  NA 135  --  131*  --  135  K 4.3  --  5.5*  --  4.3  CL 107  --  103  --  106  CO2 17*  --  14*  --  19*  GLUCOSE 183*  --  740*  --  175*  BUN 48*  --  43*  --  37*  CREATININE 1.77*  --  1.70*  --  1.58*  CALCIUM 8.2* 8.1* 8.3*  --  8.2*  MG  --  2.0 2.1  --  1.9  PHOS  --  3.0  --   --  3.1  PROT 6.1*  --  5.4*  --  5.4*  ALBUMIN 1.5*  --  1.3*  --  1.3*  AST 55*  --  41  --  45*  ALT 85*  --  59  --  59  ALKPHOS 254*  --  187*  --  215*  BILITOT 0.6  --  0.6  --  0.3  PREALBUMIN  --   --   --  11.3* 7.3*  TRIG  --   --   --   --  192*   Estimated Creatinine Clearance: 65.6 mL/min (by C-G formula based on Cr of 1.58).    Recent Labs  10/07/15 0022 10/07/15 0438 10/07/15 0736  GLUCAP 153* 170* 174*     Insulin Requirements in the past 24 hours:  8 units SSI  Current Nutrition:  Received TPN orders from [facility]: The patient was on a cyclic TPN and received 2970 ml cycled over 18 hours. The TPN contained electrolytes along with 275g (935 kcal) dextrose, 190g (760 kcal) protein, and 60g (540 kcal) lipids. TE and MV were also  added to the bag. No insulin was noted to be in the TPN  Total kcal was 2235 and total protein was 190g.   Clinimix E 5/15 at a rate of 125 ml/hr and 20% IVFE at a rate of 10 ml/hr to provide 2610 kcal and 150g protein per day (115% kcal and ~80% protein compared to home TPN)  Nutritional Goals:  Awaiting RD assessment  Assessment: 10 YOM with history of rectal cancer in 2013 at which time he underwent a  low anterior resection and chemoradiation. The cancer was noted to return with metastasis to the bladder so the patient underwent a total cystectomy with creation of ileal conduit as well as colostomy in November 2016. 2 days postop patient developed rectal dehiscence and underwent operative repair and was left with an open abdominal wound with wound  VAC and multiple drains. Initial surgical treatment appeared to have repaired the dehiscence but he had an additional anastomotic leak and developed a enteric cutaneous fistula. He now remains NPO except for meds and is on chronic TPN.  **Plans are to transfer to [facility] when a bed is available**  GI: Hx CA now s/p colostomy - now with EC fistula. On chronic TPN. We will be unable to meet the high protein and lower kcal goals with the Clinimix premix bags. Albumin 1.3, prealbumin 7.3 Endo: Hx hypothyroidism/obesity, no hx DM. CBGs 108-174. On sensitive SSI - given the higher dextrose content in the pre-mix Clinimix, the patient will likely require some insulin to be added to the TPN bag. Will change the patient from cyclic over 18 hours to a 24 hour TPN bag while monitoring CBGs with the higher dextrose content.  Lytes: Na 135, K 4.3, Mg 2, Phos 3.1, corr Ca ~10.4 Renal: CKD3 (baseline SCr 1.5), SCr 1.58, CrCl~50-65 ml/min, UOP 0.6 ml/kg/hr.  Pulm: 100%/2L-[place] Cards: BP ok, tachy. Hx HTN/DL/CAD(PCI July '16) Hepatobil: AST/ALT 55/85, Alk phos 254, Tbili wnl, TG 192 Neuro: Pain score/24h: 9 (chronic leg pain) ID: Vanc + Primaxin for r/o sepsis, Tamiflu  for flu A+. Tmax/24h: 100.4, WBC wnl.  1 of 2 BCx with Enterococcus - will need to stop TPN and have a line holiday.  Best Practices: hep SQ TPN Access: R-chest wall central line placed PTA TPN start date:  PTA   Plan:  - Hold TPN today per Dr. Venetia Constable due to Enterococcus bacteremia   [facility], [place].D., BCPS Clinical Pharmacist Pager: [phone] 10/07/2015 10:32 AM

## 2015-10-08 LAB — GLUCOSE, CAPILLARY: GLUCOSE-CAPILLARY: 95 mg/dL (ref 65–99)

## 2015-10-10 LAB — FOLATE RBC
FOLATE, HEMOLYSATE: 383.2 ng/mL
FOLATE, RBC: 1570 ng/mL (ref >498)
HEMATOCRIT: 24.4 % — AB (ref 37.5–51.0)

## 2015-10-10 LAB — CULTURE, BLOOD (ROUTINE X 2)

## 2015-10-11 LAB — CULTURE, BLOOD (ROUTINE X 2): CULTURE: NO GROWTH

## 2015-10-12 LAB — CULTURE, BLOOD (ROUTINE X 2)
CULTURE: NO GROWTH
Culture: NO GROWTH

## 2015-11-03 ENCOUNTER — Emergency Department: Payer: Medicare Other

## 2015-11-03 ENCOUNTER — Encounter: Payer: Self-pay | Admitting: Emergency Medicine

## 2015-11-03 ENCOUNTER — Inpatient Hospital Stay
Admission: EM | Admit: 2015-11-03 | Discharge: 2015-11-09 | DRG: 872 | Disposition: A | Discharge status: Skilled Nursing Facility | Payer: Medicare Other | Attending: Internal Medicine | Admitting: Internal Medicine

## 2015-11-03 DIAGNOSIS — Z794 Long term (current) use of insulin: Secondary | ICD-10-CM

## 2015-11-03 DIAGNOSIS — R627 Adult failure to thrive: Secondary | ICD-10-CM | POA: Diagnosis present

## 2015-11-03 DIAGNOSIS — M1990 Unspecified osteoarthritis, unspecified site: Secondary | ICD-10-CM | POA: Diagnosis present

## 2015-11-03 DIAGNOSIS — Y838 Other surgical procedures as the cause of abnormal reaction of the patient, or of later complication, without mention of misadventure at the time of the procedure: Secondary | ICD-10-CM | POA: Diagnosis present

## 2015-11-03 DIAGNOSIS — Z823 Family history of stroke: Secondary | ICD-10-CM

## 2015-11-03 DIAGNOSIS — E039 Hypothyroidism, unspecified: Secondary | ICD-10-CM | POA: Diagnosis present

## 2015-11-03 DIAGNOSIS — A419 Sepsis, unspecified organism: Secondary | ICD-10-CM | POA: Diagnosis present

## 2015-11-03 DIAGNOSIS — N390 Urinary tract infection, site not specified: Secondary | ICD-10-CM | POA: Diagnosis present

## 2015-11-03 DIAGNOSIS — N2581 Secondary hyperparathyroidism of renal origin: Secondary | ICD-10-CM | POA: Diagnosis present

## 2015-11-03 DIAGNOSIS — Z7901 Long term (current) use of anticoagulants: Secondary | ICD-10-CM

## 2015-11-03 DIAGNOSIS — J811 Chronic pulmonary edema: Secondary | ICD-10-CM | POA: Diagnosis present

## 2015-11-03 DIAGNOSIS — Z85048 Personal history of other malignant neoplasm of rectum, rectosigmoid junction, and anus: Secondary | ICD-10-CM | POA: Diagnosis not present

## 2015-11-03 DIAGNOSIS — Z79899 Other long term (current) drug therapy: Secondary | ICD-10-CM | POA: Diagnosis not present

## 2015-11-03 DIAGNOSIS — Z789 Other specified health status: Secondary | ICD-10-CM | POA: Diagnosis present

## 2015-11-03 DIAGNOSIS — I2510 Atherosclerotic heart disease of native coronary artery without angina pectoris: Secondary | ICD-10-CM | POA: Diagnosis present

## 2015-11-03 DIAGNOSIS — E876 Hypokalemia: Secondary | ICD-10-CM | POA: Diagnosis present

## 2015-11-03 DIAGNOSIS — N183 Chronic kidney disease, stage 3 (moderate): Secondary | ICD-10-CM | POA: Diagnosis present

## 2015-11-03 DIAGNOSIS — Z833 Family history of diabetes mellitus: Secondary | ICD-10-CM | POA: Diagnosis not present

## 2015-11-03 DIAGNOSIS — Z7982 Long term (current) use of aspirin: Secondary | ICD-10-CM | POA: Diagnosis not present

## 2015-11-03 DIAGNOSIS — E559 Vitamin D deficiency, unspecified: Secondary | ICD-10-CM | POA: Diagnosis present

## 2015-11-03 DIAGNOSIS — D638 Anemia in other chronic diseases classified elsewhere: Secondary | ICD-10-CM | POA: Diagnosis present

## 2015-11-03 DIAGNOSIS — R000 Tachycardia, unspecified: Secondary | ICD-10-CM | POA: Diagnosis present

## 2015-11-03 DIAGNOSIS — F419 Anxiety disorder, unspecified: Secondary | ICD-10-CM | POA: Diagnosis present

## 2015-11-03 DIAGNOSIS — Z801 Family history of malignant neoplasm of trachea, bronchus and lung: Secondary | ICD-10-CM | POA: Diagnosis not present

## 2015-11-03 DIAGNOSIS — E1122 Type 2 diabetes mellitus with diabetic chronic kidney disease: Secondary | ICD-10-CM | POA: Diagnosis present

## 2015-11-03 DIAGNOSIS — Z8249 Family history of ischemic heart disease and other diseases of the circulatory system: Secondary | ICD-10-CM

## 2015-11-03 DIAGNOSIS — Z933 Colostomy status: Secondary | ICD-10-CM | POA: Diagnosis not present

## 2015-11-03 DIAGNOSIS — E785 Hyperlipidemia, unspecified: Secondary | ICD-10-CM | POA: Diagnosis present

## 2015-11-03 DIAGNOSIS — T8189XA Other complications of procedures, not elsewhere classified, initial encounter: Secondary | ICD-10-CM | POA: Diagnosis present

## 2015-11-03 DIAGNOSIS — I129 Hypertensive chronic kidney disease with stage 1 through stage 4 chronic kidney disease, or unspecified chronic kidney disease: Secondary | ICD-10-CM | POA: Diagnosis present

## 2015-11-03 DIAGNOSIS — Z86718 Personal history of other venous thrombosis and embolism: Secondary | ICD-10-CM

## 2015-11-03 DIAGNOSIS — I252 Old myocardial infarction: Secondary | ICD-10-CM | POA: Diagnosis not present

## 2015-11-03 DIAGNOSIS — Z86711 Personal history of pulmonary embolism: Secondary | ICD-10-CM | POA: Diagnosis not present

## 2015-11-03 DIAGNOSIS — Z79891 Long term (current) use of opiate analgesic: Secondary | ICD-10-CM | POA: Diagnosis not present

## 2015-11-03 DIAGNOSIS — E119 Type 2 diabetes mellitus without complications: Secondary | ICD-10-CM

## 2015-11-03 DIAGNOSIS — E861 Hypovolemia: Secondary | ICD-10-CM | POA: Diagnosis present

## 2015-11-03 DIAGNOSIS — N289 Disorder of kidney and ureter, unspecified: Secondary | ICD-10-CM

## 2015-11-03 DIAGNOSIS — N189 Chronic kidney disease, unspecified: Secondary | ICD-10-CM

## 2015-11-03 HISTORY — DX: Anxiety disorder, unspecified: F41.9

## 2015-11-03 HISTORY — DX: Adult failure to thrive: R62.7

## 2015-11-03 HISTORY — DX: Malignant neoplasm of rectum: C20

## 2015-11-03 LAB — URINALYSIS COMPLETE WITH MICROSCOPIC (ARMC ONLY)
BILIRUBIN URINE: NEGATIVE
Glucose, UA: NEGATIVE mg/dL
Ketones, ur: NEGATIVE mg/dL
Nitrite: NEGATIVE
PH: 9 — AB (ref 5.0–8.0)
PROTEIN: 100 mg/dL — AB
Specific Gravity, Urine: 1.011 (ref 1.005–1.030)

## 2015-11-03 LAB — COMPREHENSIVE METABOLIC PANEL
ALBUMIN: 1.9 g/dL — AB (ref 3.5–5.0)
ALT: 57 U/L (ref 17–63)
AST: 53 U/L — AB (ref 15–41)
Alkaline Phosphatase: 184 U/L — ABNORMAL HIGH (ref 38–126)
Anion gap: 8 (ref 5–15)
BUN: 58 mg/dL — AB (ref 6–20)
CHLORIDE: 98 mmol/L — AB (ref 101–111)
CO2: 28 mmol/L (ref 22–32)
Calcium: 8.3 mg/dL — ABNORMAL LOW (ref 8.9–10.3)
Creatinine, Ser: 2.03 mg/dL — ABNORMAL HIGH (ref 0.61–1.24)
GFR calc Af Amer: 38 mL/min — ABNORMAL LOW (ref >60)
GFR, EST NON AFRICAN AMERICAN: 33 mL/min — AB (ref >60)
GLUCOSE: 119 mg/dL — AB (ref 65–99)
POTASSIUM: 3.2 mmol/L — AB (ref 3.5–5.1)
SODIUM: 134 mmol/L — AB (ref 135–145)
TOTAL PROTEIN: 7.2 g/dL (ref 6.5–8.1)
Total Bilirubin: 0.9 mg/dL (ref 0.3–1.2)

## 2015-11-03 LAB — CBC
HCT: 22.9 % — ABNORMAL LOW (ref 40.0–52.0)
HEMOGLOBIN: 7.3 g/dL — AB (ref 13.0–18.0)
MCH: 26.6 pg (ref 26.0–34.0)
MCHC: 32 g/dL (ref 32.0–36.0)
MCV: 83.2 fL (ref 80.0–100.0)
Platelets: 266 10*3/uL (ref 150–440)
RBC: 2.75 MIL/uL — ABNORMAL LOW (ref 4.40–5.90)
RDW: 16.6 % — AB (ref 11.5–14.5)
WBC: 7.1 10*3/uL (ref 3.8–10.6)

## 2015-11-03 LAB — ABO/RH: ABO/RH(D): A POS

## 2015-11-03 LAB — MAGNESIUM: Magnesium: 2.3 mg/dL (ref 1.7–2.4)

## 2015-11-03 LAB — TROPONIN I: TROPONIN I: 0.06 ng/mL — AB (ref <0.031)

## 2015-11-03 MED ORDER — INSULIN ASPART 100 UNIT/ML ~~LOC~~ SOLN
0.0000 [IU] | Freq: Three times a day (TID) | SUBCUTANEOUS | Status: DC
  Administered 2015-11-04 (×2): 2 [IU] via SUBCUTANEOUS
  Filled 2015-11-03 (×2): qty 2

## 2015-11-03 MED ORDER — HEPARIN SODIUM (PORCINE) 5000 UNIT/ML IJ SOLN
5000.0000 [IU] | Freq: Three times a day (TID) | INTRAMUSCULAR | Status: DC
  Administered 2015-11-04: 5000 [IU] via SUBCUTANEOUS
  Filled 2015-11-03: qty 1

## 2015-11-03 MED ORDER — MORPHINE SULFATE (PF) 2 MG/ML IV SOLN: 2.0000 mg | INTRAVENOUS | Status: DC | PRN

## 2015-11-03 MED ORDER — POTASSIUM CHLORIDE 10 MEQ/100ML IV SOLN
10.0000 meq | INTRAVENOUS | Status: AC
  Administered 2015-11-03 – 2015-11-04 (×4): 10 meq via INTRAVENOUS
  Filled 2015-11-03 (×4): qty 100

## 2015-11-03 MED ORDER — OXYCODONE-ACETAMINOPHEN 5-325 MG PO TABS
1.0000 | ORAL_TABLET | Freq: Once | ORAL | Status: AC
  Administered 2015-11-03: 1 via ORAL
  Filled 2015-11-03: qty 1

## 2015-11-03 MED ORDER — SODIUM CHLORIDE 0.9 % IV SOLN
1000.0000 mL | Freq: Once | INTRAVENOUS | Status: AC
  Administered 2015-11-03: 1000 mL via INTRAVENOUS

## 2015-11-03 MED ORDER — VANCOMYCIN HCL 10 G IV SOLR
1500.0000 mg | Freq: Once | INTRAVENOUS | Status: AC
  Administered 2015-11-04: 1500 mg via INTRAVENOUS
  Filled 2015-11-03: qty 1500

## 2015-11-03 MED ORDER — DEXTROSE 5 % IV SOLN
1.0000 g | Freq: Once | INTRAVENOUS | Status: AC
  Administered 2015-11-03: 1 g via INTRAVENOUS
  Filled 2015-11-03: qty 10

## 2015-11-03 MED ORDER — SODIUM CHLORIDE 0.9 % IV SOLN
INTRAVENOUS | Status: AC
  Administered 2015-11-03 – 2015-11-05 (×3): via INTRAVENOUS

## 2015-11-03 MED ORDER — SODIUM CHLORIDE 0.9 % IV SOLN
500.0000 mg | Freq: Three times a day (TID) | INTRAVENOUS | Status: DC
  Administered 2015-11-03 – 2015-11-06 (×8): 500 mg via INTRAVENOUS
  Filled 2015-11-03 (×12): qty 500

## 2015-11-03 NOTE — ED Notes (Signed)
Pt repositioned onto back

## 2015-11-03 NOTE — ED Provider Notes (Signed)
[facility] Emergency Department Provider Note  ____________________________________________    I have reviewed the triage vital signs and the nursing notes.   HISTORY  Chief Complaint Abnormal Lab    HPI Richard Warner is a 65 y.o. male who was sent from [facility] for reported low hemoglobin levels. Patient has extensive past medical history detailed below, but in short he has a colostomy, urostomy, a wound VAC for a central abdominal wound, he receives TPN via PICC line in his upper right chest. He denies fevers. He was told that his hemoglobin was 6 today. He has not noticed dark stool in his colostomy, he has not been bleeding from other places. He is not on blood thinners     Past Medical History  Diagnosis Date  . Hypertension   . Pulmonary embolism ([place]) 12-03-11    01-17-10 s/p DVT left leg -Coumadin tx.until 08-28-11  . Hyperlipidemia   . Leg swelling 12-03-11    bilateral if up most of day,equally swells, up to knee level, improved now  . Fractures 12-03-11    s/p right ankle and right wrist- no problems now  . Hypothyroidism   . Dyslipidemia   . Anemia 12-03-11    tx. oral iron supplement  . Arthritis 12-03-11    mild lower back  . Malignant neoplasm of sigmoid (flexure) ([place]) 10/08/2011  . Colon cancer ([place]) 12/14/11    Low anterior resection of mid/proximal tumor=invasive adenocarcinoma,invaing through the muscularis propria into pericolonic fatty tissue  . Chronic kidney disease 12-03-11    kidney stone x1 ,none recent stage III  . Secondary hyperparathyroidism, renal ([place])   . Chronic anticoagulation   . Shortness of breath 12-03-11    hx. 6'11- during pulmonary emboli episode, none now, extreme exertion only.  . Morbid obesity ([place])   . Vitamin D deficiency   . Chronic pulmonary edema   . Dorsalgia   . Acute MI, true posterior wall, initial episode of care ([place]) 02/27/2015  . Malignant neoplasm of rectum ([place])   . Failure to  thrive in adult   . Anxiety     Patient Active Problem List   Diagnosis Date Noted  . Enterococcal bacteremia 10/07/2015  . Sepsis ([place]) 10/06/2015  . 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 Open wnd anterior abdomen 06/25/2015  . Chronic pulmonary embolism ([place])   . 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 Encounter for therapeutic drug monitoring 09/01/2013  . Rectal cancer, pT3pN0 (0/12 LN) h/o LAR and chemorad; recurred Nov 2016 s/p total cystectomy 12/19/2011  . Iron deficiency anemia, unspecified 09/12/2011  . History of DVT and recurrent PE 02/22/2010  . Hypothyroidism 02/21/2010  . HLD (hyperlipidemia) 02/21/2010  . Obesity, Class III, BMI 40-49.9 (morbid obesity) ([place]) 02/21/2010  . Essential hypertension 02/21/2010  . RENAL DISEASE, CHRONIC, STAGE III 02/21/2010    Past Surgical History  Procedure Laterality Date  . Colonoscopy  10/08/2011    Procedure: COLONOSCOPY;  Surgeon: Inda Castle, MD;  Location: [facility]COPY;  Service: Endoscopy;  Laterality: N/A;  . Tonsillectomy  12-03-11    child  . Proctoscopy  12/14/2011    Procedure: PROCTOSCOPY;  Surgeon: [name], MD;  Location: [facility] ORS;  Service: General;  Laterality: N/A;  . Low anterior bowel resection  12/14/2011    Mid/proximal rectal cancer. T3 N0  . Colonoscopy N/A 05/15/2013    Procedure: COLONOSCOPY;  Surgeon: Inda Castle, MD;  Location: [facility];  Service: Endoscopy;  Laterality: N/A;  . Colonoscopy N/A 07/27/2014    Procedure: COLONOSCOPY;  Surgeon: Inda Castle, MD;  Location: [facility];  Service: Endoscopy;  Laterality: N/A;  . Cyst removal trunk N/A 09/17/2014    Procedure: REMOVAL OF ANTERIOR CHEST WALL SKIN MASS;  Surgeon: Michael Boston, MD;  Location: [facility];  Service: General;  Laterality: N/A;  . Cystoscopy with biopsy N/A 12/03/2014    Procedure: CYSTOSCOPY WITH BIOPSY;  Surgeon: Lowella Bandy, MD;  Location: [facility];  Service: Urology;  Laterality: N/A;   . Eus N/A 01/06/2015    Procedure: LOWER ENDOSCOPIC ULTRASOUND (EUS);  Surgeon: [name], MD;  Location: [facility];  Service: Endoscopy;  Laterality: N/A;  . Transurethral resection of bladder tumor N/A 02/23/2015    Procedure: TRANSURETHRAL RESECTION OF BLADDER MASS;  Surgeon: Lowella Bandy, MD;  Location: [facility];  Service: Urology;  Laterality: N/A;  . Cystoscopy N/A 02/23/2015    Procedure: CYSTOSCOPY;  Surgeon: Lowella Bandy, MD;  Location: [facility];  Service: Urology;  Laterality: N/A;  . Cardiac catheterization N/A 02/27/2015    Procedure: Coronary Stent Intervention;  Surgeon: [name], MD;  Location: [facility] [place] [facility];  Service: Cardiovascular;  Laterality: N/A;    Current Outpatient Rx  Name  Route  Sig  Dispense  Refill  . aspirin EC 81 MG tablet   Oral   Take 1 tablet (81 mg total) by mouth daily.   90 tablet   3   . atorvastatin (LIPITOR) 80 MG tablet   Oral   Take 1 tablet (80 mg total) by mouth daily at 6 PM.   30 tablet   5   . Cholecalciferol (VITAMIN D3) 5000 UNITS TABS   Oral   Take 5,000 Units by mouth daily after supper.          . famotidine (PEPCID) 20 MG tablet   Oral   Take 20 mg by mouth daily.         . ferrous sulfate 325 (65 FE) MG tablet   Oral   Take 1 tablet (325 mg total) by mouth 3 (three) times daily with meals. Patient taking differently: Take 325 mg by mouth daily with breakfast.    90 tablet   3   . heparin 100-0.45 UNIT/ML-% infusion   Intravenous   Inject 1,700 Units/hr into the vein continuous.   250 mL   0   . imipenem-cilastatin 500 mg in sodium chloride 0.9 % 100 mL   Intravenous   Inject 500 mg into the vein every 8 (eight) hours.         . insulin regular (NOVOLIN R,HUMULIN R) 100 units/mL injection   Subcutaneous   Inject 0.02 mLs (2 Units total) into the skin every 6 (six) hours as needed for high blood sugar (CBG is greater than 150).   10 mL   11   . levothyroxine (SYNTHROID, LEVOTHROID) 88 MCG tablet    Oral   Take 88 mcg by mouth daily before breakfast.         . metoprolol tartrate (LOPRESSOR) 25 MG tablet   Oral   Take 1 tablet (25 mg total) by mouth 2 (two) times daily. Patient taking differently: Take 25 mg by mouth daily.    60 tablet   8   . mirtazapine (REMERON) 15 MG tablet   Oral   Take 15 mg by mouth at bedtime.         [name]  nitroGLYCERIN (NITROSTAT) 0.4 MG SL tablet   Sublingual   Place 0.4 mg under the tongue every 5 (five) minutes as needed for chest pain (3 doses MAX).         [name] oxyCODONE (OXY IR/ROXICODONE) 5 MG immediate release tablet   Oral   Take 5 mg by mouth every 4 (four) hours as needed for severe pain.         [name] tetrahydrozoline 0.05 % ophthalmic solution   Both Eyes   Place 2 drops into both eyes every 8 (eight) hours as needed (dry eyes).         . vancomycin 1,500 mg in sodium chloride 0.9 % 500 mL   Intravenous   Inject 1,500 mg into the vein daily.           Allergies Review of patient's allergies indicates no known allergies.  Family History  Problem Relation Age of Onset  . Diabetes Father   . Heart disease Father   . Stroke Maternal Grandmother   . Stroke Maternal Grandfather   . Hypertension Paternal Grandfather   . Lung cancer Paternal Grandfather   . Cancer Paternal Grandfather     Social History Social History  Substance Use Topics  . Smoking status: Never Smoker   . Smokeless tobacco: Never Used  . Alcohol Use: No    Review of Systems  Constitutional: Negative for fever. Eyes: Negative for redness ENT: Negative for sore throat Cardiovascular: Negative for chest pain Respiratory: Negative for shortness of breath. Gastrointestinal: Negative for abdominal pain Genitourinary: Negative for dysuria. Musculoskeletal: Chronic back pain Skin: Negative for pallor, negative for bed ulcers Neurological: Negative for focal weakness Psychiatric: no  anxiety    ____________________________________________   PHYSICAL EXAM:  VITAL SIGNS: ED Triage Vitals  Enc Vitals Group     BP 11/03/15 1550 113/67 mmHg     Pulse Rate 11/03/15 1550 126     Resp 11/03/15 1550 27     Temp 11/03/15 1550 98.1 F (36.7 C)     Temp Source 11/03/15 1550 Oral     SpO2 11/03/15 1550 99 %     Weight 11/03/15 1550 294 lb 3.2 oz (133.448 kg)     Height 11/03/15 1550 5\' 11"  (1.803 m)     Head Cir --      Peak Flow --      Pain Score --      Pain Loc --      Pain Edu? --      Excl. in [place]? --      Constitutional: Alert and oriented. No acute distress Eyes: Conjunctivae are normal. No erythema or injection ENT   Head: Normocephalic and atraumatic.   Mouth/Throat: Mucous membranes are moist. Cardiovascular: Normal rate, regular rhythm. Normal and symmetric distal pulses are present in the upper extremities.  Respiratory: Normal respiratory effort without tachypnea nor retractions. Breath sounds are clear and equal bilaterally.  Gastrointestinal: Soft and non-tender in all quadrants. No distention. Colostomy bag is empty, urostomy bag with some sediment and yellow urine. Wound VAC was placed appropriately. Genitourinary: deferred Musculoskeletal: Nontender with normal range of motion in all extremities. No lower extremity tenderness nor edema. Neurologic:  Normal speech and language. No gross focal neurologic deficits are appreciated. Skin:  Skin is warm, dry and intact. No rash noted. Psychiatric: Mood and affect are normal. Patient exhibits appropriate insight and judgment.  ____________________________________________    LABS (pertinent positives/negatives)  Labs Reviewed  CBC - Abnormal; Notable for the  following:    RBC 2.75 (*)    Hemoglobin 7.3 (*)    HCT 22.9 (*)    RDW 16.6 (*)    All other components within normal limits  COMPREHENSIVE METABOLIC PANEL  TROPONIN I  TYPE AND SCREEN  ABO/RH     ____________________________________________   EKG  ED ECG REPORT I, Lavonia Drafts, the attending physician, personally viewed and interpreted this ECG.  Date: 11/03/2015 EKG Time: 3:58 PM Rate: 124 Rhythm: Sinus tachycardia QRS Axis: normal Intervals: normal ST/T Wave abnormalities: normal Conduction Disturbances: none Narrative Interpretation: unremarkable   ____________________________________________    RADIOLOGY  Chest x-ray unremarkable  ____________________________________________   PROCEDURES  Procedure(s) performed: none  Critical Care performed: none  ____________________________________________   INITIAL IMPRESSION / ASSESSMENT AND PLAN / ED COURSE  Pertinent labs & imaging results that were available during my care of the patient were reviewed by me and considered in my medical decision making (see chart for details).  Patient presents with complaints of low hemoglobin. We will send type and screen and repeat labs  Patient's hemoglobin is at baseline.  Patient is persistently tachycardic after fluids. Lab work is overall unremarkable the patient will require admission for further evaluation of tachycardia given multiple comorbidities. IV Rocephin given for possible UTI  ____________________________________________   FINAL CLINICAL IMPRESSION(S) / ED DIAGNOSES  Final diagnoses:  UTI (lower urinary tract infection)  Tachycardia, unspecified          Lavonia Drafts, MD 11/03/15 2113

## 2015-11-03 NOTE — Progress Notes (Signed)
Pharmacy Antibiotic Note  Richard Warner is a 65 y.o. male admitted on 11/03/2015 with sepsis.  Pharmacy has been consulted for vancomycin dosing.  Plan: Vancomycin 1.5 gm IV Q18H with stacked dosing, second dose approximately 9 hours after first, predicted trough 17 mcg/mL. Pharmacy will continue to follow and adjust as needed to maintain trough 15 to 20 mcg/mL.  Vd 68.9 L, Ke 0.046 hr-1, T1/2 14.9 hr  Height: 5\' 11"  (180.3 cm) Weight: 294 lb 3.2 oz (133.448 kg) IBW/kg (Calculated) : 75.3  Temp (24hrs), Avg:98.1 F (36.7 C), Min:98.1 F (36.7 C), Max:98.1 F (36.7 C)   Recent Labs Lab 11/03/15 1609  WBC 7.1  CREATININE 2.03*    Estimated Creatinine Clearance: 50.5 mL/min (by C-G formula based on Cr of 2.03).    No Known Allergies  Thank you for allowing pharmacy to be a part of this patient's care.  Laural Benes, Pharm.D., BCPS Clinical Pharmacist 11/03/2015 11:19 PM

## 2015-11-03 NOTE — ED Notes (Signed)
Admission MD at bedside.  

## 2015-11-03 NOTE — ED Notes (Signed)
Pt comes into the ED via EMS from [facility] c/o abnormal Hgb levels.  Patient has PICC to upper right chest, wound vac to fistula, urostomy, and colostomy.  Initial vitals :  101/61, 126 HR. Patient normally received TPN from 12:00-6:00 am running at 133 ml/hr and it was interrupted for transportation at 15:15.

## 2015-11-03 NOTE — H&P (Signed)
[facility] NAME: Richard Warner    MR#:  [id number]  DATE OF BIRTH:  [birth year]  DATE OF ADMISSION:  11/03/2015  PRIMARY CARE PHYSICIAN: Rachell Cipro, MD   REQUESTING/REFERRING PHYSICIAN: Dr Corky Downs  CHIEF COMPLAINT:   Chief Complaint  Patient presents with  . Abnormal Lab    HISTORY OF PRESENT ILLNESS: Richard Warner  is a 65 y.o. male with a known history of Hypothyroidism, anemia, hyperlipidemia, colon cancer, chronic kidney disease, CAD, FTT and a history of rectal cancer. He has a recent complicated medical/surgical history and had surgery 3 in November 2016 that resulted in a urostomy, colostomy and an abdominal wound VAC. He is currently on total parenteral nutrition and resides in a skilled nursing facility. Was brought to the ED today from the SNF due to a hemoglobin of 6. Patient complains of generalized weakness that is chronic and has no specific complaints today. He is depressed and is a poor historian. Mother is at bedside. There is no report of fever, cough, shortness of breath, chest pain, abdominal pain or changes in urine habits. He was initially reported that patient has some dark stool in his colostomy bag but he and his mother expressly denied any bleeding or melanotic contents in the colostomy bag. There is no recent change in medications.  On arrival in the ED, patient was tachycardic at 1 3200 and respiratory rate was 27. The rest of vital signs are stable. CBC was at baseline with a hemoglobin of 7.3 and CMP was only significant for a potassium of 3.2, BUN of 58 from a baseline of 37 and creatinine of 2.03 from baseline of 1.5. Troponin was mildly elevated at 0.06. Patient was typed and screened and blood cultures were obtained. Urinalysis showed rare bacteria with 3+ leukocytes. Chest x-ray was negative and EKG showed sinus tachycardia. Blood and urine cultures were obtained and patient will be admitted  due to sepsis with suspected intra-abdominal source, hypokalemia and acute on chronic renal insufficiency.  Patient is a DO NOT INTUBATE but wants to be coded if necessary.  PAST MEDICAL HISTORY:   Past Medical History  Diagnosis Date  . Hypertension   . Pulmonary embolism ([place]) 12-03-11    01-17-10 s/p DVT left leg -Coumadin tx.until 08-28-11  . Hyperlipidemia   . Leg swelling 12-03-11    bilateral if up most of day,equally swells, up to knee level, improved now  . Fractures 12-03-11    s/p right ankle and right wrist- no problems now  . Hypothyroidism   . Dyslipidemia   . Anemia 12-03-11    tx. oral iron supplement  . Arthritis 12-03-11    mild lower back  . Malignant neoplasm of sigmoid (flexure) ([place]) 10/08/2011  . Colon cancer ([place]) 12/14/11    Low anterior resection of mid/proximal tumor=invasive adenocarcinoma,invaing through the muscularis propria into pericolonic fatty tissue  . Chronic kidney disease 12-03-11    kidney stone x1 ,none recent stage III  . Secondary hyperparathyroidism, renal ([place])   . Chronic anticoagulation   . Shortness of breath 12-03-11    hx. 6'11- during pulmonary emboli episode, none now, extreme exertion only.  . Morbid obesity ([place])   . Vitamin D deficiency   . Chronic pulmonary edema   . Dorsalgia   . Acute MI, true posterior wall, initial episode of care ([place]) 02/27/2015  . Malignant neoplasm of rectum ([place])   . Failure to thrive in adult   .  Anxiety     PAST SURGICAL HISTORY: Past Surgical History  Procedure Laterality Date  . Colonoscopy  10/08/2011    Procedure: COLONOSCOPY;  Surgeon: Inda Castle, MD;  Location: [facility];  Service: Endoscopy;  Laterality: N/A;  . Tonsillectomy  12-03-11    child  . Proctoscopy  12/14/2011    Procedure: PROCTOSCOPY;  Surgeon: [name], MD;  Location: [facility];  Service: General;  Laterality: N/A;  . Low anterior bowel resection  12/14/2011    Mid/proximal rectal cancer. T3 N0  . Colonoscopy N/A  05/15/2013    Procedure: COLONOSCOPY;  Surgeon: Inda Castle, MD;  Location: [facility];  Service: Endoscopy;  Laterality: N/A;  . Colonoscopy N/A 07/27/2014    Procedure: COLONOSCOPY;  Surgeon: Inda Castle, MD;  [facility];  Service: Endoscopy;  Laterality: N/A;  . Cyst removal trunk N/A 09/17/2014    Procedure: REMOVAL OF ANTERIOR CHEST WALL SKIN MASS;  Surgeon: Michael Boston, MD;  Location: [facility];  Service: General;  Laterality: N/A;  . Cystoscopy with biopsy N/A 12/03/2014    Procedure: CYSTOSCOPY WITH BIOPSY;  Surgeon: Lowella Bandy, MD;  Location: [facility];  Service: Urology;  Laterality: N/A;  . Eus N/A 01/06/2015    Procedure: LOWER ENDOSCOPIC ULTRASOUND (EUS);  Surgeon: [name], MD;  Location: [facility];  Service: Endoscopy;  Laterality: N/A;  . Transurethral resection of bladder tumor N/A 02/23/2015    Procedure: TRANSURETHRAL RESECTION OF BLADDER MASS;  Surgeon: Lowella Bandy, MD;  Location: [facility];  Service: Urology;  Laterality: N/A;  . Cystoscopy N/A 02/23/2015    Procedure: CYSTOSCOPY;  Surgeon: Lowella Bandy, MD;  Location: [facility];  Service: Urology;  Laterality: N/A;  . Cardiac catheterization N/A 02/27/2015    Procedure: Coronary Stent Intervention;  Surgeon: [name], MD;  Location: [facility];  Service: Cardiovascular;  Laterality: N/A;    SOCIAL HISTORY:  Social History  Substance Use Topics  . Smoking status: Never Smoker   . Smokeless tobacco: Never Used  . Alcohol Use: No    FAMILY HISTORY:  Family History  Problem Relation Age of Onset  . Diabetes Father   . Heart disease Father   . Stroke Maternal Grandmother   . Stroke Maternal Grandfather   . Hypertension Paternal Grandfather   . Lung cancer Paternal Grandfather   . Cancer Paternal Grandfather     DRUG ALLERGIES: No Known Allergies  REVIEW OF SYSTEMS:   CONSTITUTIONAL: No fever, + fatigue or weakness.  EYES: No blurred or double vision.  EARS, NOSE, AND THROAT: No  tinnitus or ear pain.  RESPIRATORY: No cough, shortness of breath, wheezing or hemoptysis.  CARDIOVASCULAR: No chest pain, orthopnea, edema.  GASTROINTESTINAL: No nausea, vomiting, diarrhea or abdominal pain.  GENITOURINARY: No dysuria, hematuria.  ENDOCRINE: No polyuria, nocturia,  HEMATOLOGY: No anemia, easy bruising or bleeding SKIN: No rash or lesion. MUSCULOSKELETAL: + Back pain NEUROLOGIC: No tingling, numbness, weakness.  PSYCHIATRY: + depression.   MEDICATIONS AT HOME:  Prior to Admission medications   Medication Sig Start Date End Date Taking? Authorizing Provider  aspirin EC 81 MG tablet Take 1 tablet (81 mg total) by mouth daily. 04/12/15  Yes Isaiah Serge, NP  Cholecalciferol (VITAMIN D3) 5000 UNITS TABS Take 5,000 Units by mouth daily after supper.    Yes Historical Provider, MD  famotidine (PEPCID) 20 MG tablet Take 20 mg by mouth daily.   Yes Historical Provider, MD  ferrous sulfate 325 (65 FE)  MG tablet Take 325 mg by mouth daily with breakfast.   Yes Historical Provider, MD  heparin 5000 UNIT/ML injection Inject 7,500 Units into the skin every 6 (six) hours as needed (prophylaxis).   Yes Historical Provider, MD  insulin regular (NOVOLIN R,HUMULIN R) 100 units/mL injection Inject 0.02 mLs (2 Units total) into the skin every 6 (six) hours as needed for high blood sugar (CBG is greater than 150). Patient taking differently: Inject 2-12 Units into the skin every 6 (six) hours as needed for high blood sugar (CBG is greater than 150). Per sliding scale: 51-70 = 0 units (give juice and crackers), 71-150 = 0 units, 151-200 = 2 units, 201-250 = 4 units, 251-300 = 6 units, 301-350 = 8 units, 351-400 = 10 units, 401-150 = 12 units, 451+ = call MD 10/07/15  Yes Charlynne Cousins, MD  lamoTRIgine (LAMICTAL) 25 MG tablet Take 25 mg by mouth at bedtime. Take 25 mg qhs for 7 days. 10/31/15 11/06/15 Yes Historical Provider, MD  lamoTRIgine (LAMICTAL) 25 MG tablet Take 50 mg by mouth daily. 11/07/15   Yes Historical Provider, MD  levothyroxine (SYNTHROID, LEVOTHROID) 88 MCG tablet Take 88 mcg by mouth daily before breakfast.   Yes Historical Provider, MD  Melatonin 3 MG TABS Take 6 mg by mouth at bedtime.   Yes Historical Provider, MD  metoprolol tartrate (LOPRESSOR) 25 MG tablet Take 1 tablet (25 mg total) by mouth 2 (two) times daily. 05/23/15  Yes [name], MD  mirtazapine (REMERON) 15 MG tablet Take 15 mg by mouth at bedtime.   Yes Historical Provider, MD  nitroGLYCERIN (NITROSTAT) 0.4 MG SL tablet Place 0.4 mg under the tongue every 5 (five) minutes as needed for chest pain (3 doses MAX).   Yes Historical Provider, MD  tetrahydrozoline 0.05 % ophthalmic solution Place 2 drops into both eyes every 8 (eight) hours as needed (dry eyes).   Yes Historical Provider, MD  oxyCODONE (OXY IR/ROXICODONE) 5 MG immediate release tablet Take 5 mg by mouth every 4 (four) hours as needed for severe pain.    Historical Provider, MD      PHYSICAL EXAMINATION:   VITAL SIGNS: Blood pressure 105/58, pulse 116, temperature 98.1 F (36.7 C), temperature source Oral, resp. rate 18, height 5\' 11"  (1.803 m), weight 133.448 kg (294 lb 3.2 oz), SpO2 95 %.  GENERAL: Obese 65 y.o.-year-old patient lying in the bed, chronically ill-looking. Alert and oriented x 3. EYES: Pupils equal, round, reactive to light and accommodation. No scleral icterus. Extraocular muscles intact.  HEENT: Head atraumatic, normocephalic. Oropharynx and nasopharynx clear.  NECK:  Supple, no jugular venous distention. No thyroid enlargement, no tenderness.  LUNGS: Normal breath sounds bilaterally, no wheezing, rales,rhonchi or crepitation. No use of accessory muscles of respiration.  CARDIOVASCULAR: S1, S2 normal. No murmurs, rubs, or gallops.  ABDOMEN: obese, urostomy and colostomy appear normal. Wound VAC present in midline, soft, nontender, nondistended. Bowel sounds present. No organomegaly or mass.  EXTREMITIES: No pedal edema,  cyanosis, or clubbing.  NEUROLOGIC: Cranial nerves II through XII are intact. Muscle strength 5/5 in all extremities. Sensation intact. Gait not checked.   SKIN: No obvious rash, lesion, or ulcer.   LABORATORY PANEL:   CBC  Recent Labs Lab 11/03/15 1609  WBC 7.1  HGB 7.3*  HCT 22.9*  PLT 266  MCV 83.2  MCH 26.6  MCHC 32.0  RDW 16.6*   ------------------------------------------------------------------------------------------------------------------  Chemistries   Recent Labs Lab 11/03/15 1609  NA 134*  K  3.2*  CL 98*  CO2 28  GLUCOSE 119*  BUN 58*  CREATININE 2.03*  CALCIUM 8.3*  AST 53*  ALT 57  ALKPHOS 184*  BILITOT 0.9   ------------------------------------------------------------------------------------------------------------------ estimated creatinine clearance is 50.5 mL/min (by C-G formula based on Cr of 2.03). ------------------------------------------------------------------------------------------------------------------ No results for input(s): TSH, T4TOTAL, T3FREE, THYROIDAB in the last 72 hours.  Invalid input(s): FREET3   Coagulation profile No results for input(s): INR, PROTIME in the last 168 hours. ------------------------------------------------------------------------------------------------------------------- No results for input(s): DDIMER in the last 72 hours. -------------------------------------------------------------------------------------------------------------------  Cardiac Enzymes  Recent Labs Lab 11/03/15 1609  TROPONINI 0.06*   ------------------------------------------------------------------------------------------------------------------ Invalid input(s): POCBNP  ---------------------------------------------------------------------------------------------------------------  Urinalysis    Component Value Date/Time   COLORURINE YELLOW* 11/03/2015 2015   APPEARANCEUR TURBID* 11/03/2015 2015   LABSPEC 1.011  11/03/2015 2015   LABSPEC 1.010 08/31/2014 1228   PHURINE 9.0* 11/03/2015 2015   PHURINE 6.0 08/31/2014 1228   GLUCOSEU NEGATIVE 11/03/2015 2015   GLUCOSEU Negative 08/31/2014 1228   HGBUR 2+* 11/03/2015 2015   HGBUR Small 08/31/2014 1228   BILIRUBINUR NEGATIVE 11/03/2015 2015   BILIRUBINUR Negative 08/31/2014 [place] 11/03/2015 2015   KETONESUR Negative 08/31/2014 1228   PROTEINUR 100* 11/03/2015 2015   PROTEINUR Negative 08/31/2014 1228   UROBILINOGEN 1.0 03/11/2015 1744   UROBILINOGEN 0.2 08/31/2014 1228   NITRITE NEGATIVE 11/03/2015 2015   NITRITE Negative 08/31/2014 1228   LEUKOCYTESUR 3+* 11/03/2015 2015   LEUKOCYTESUR Negative 08/31/2014 1228     RADIOLOGY: Dg Chest Portable 1 View  11/03/2015  CLINICAL DATA:  Weakness EXAM: PORTABLE CHEST 1 VIEW COMPARISON:  10/06/2015 FINDINGS: Cardiac shadow is mildly enlarged but stable. A right-sided jugular central line is again seen and stable. The lungs are well aerated bilaterally. No focal infiltrate or bony abnormality is seen. IMPRESSION: No active disease. Electronically Signed   By: Inez Catalina M.D.   On: 11/03/2015 16:41    EKG: Orders placed or performed during the hospital encounter of 11/03/15  . EKG 12-Lead  . EKG 12-Lead  . ED EKG  . ED EKG    ASSESSMENT  Principal Problem:   Sepsis ([place]) Active Problems:   Anemia of chronic disease   Acute on chronic renal insufficiency (HCC)   Hypokalemia   Type 2 diabetes mellitus ([place])   On total parenteral nutrition (TPN)  PLAN   1). Early Sepsis  - Patient is tachycardic at 132 and tachypneic at 27. Etiology of sepsis is unclear but patient has multiple possible etiologies especially intra-abdominal origins. Urinalysis is consistent with a UTI but urine cultures have been obtained. - Continue patient on vancomycin and Etarpenem (due to history of enterococcal bacteremia treated with these antibiotics) and follow up results of blood cultures. Adjust  antibiotics if necessary. - Continue to monitor patient's vitals closely.  2). Acute on chronic renal insufficiency - Patient has a creatinine of 2.03 from a baseline of 1.58 and a BUN of 58 from a baseline of 37. This is mostly likely due to inadequate fluid intake. Patient has been bolused with normal saline in the ED and will continue IV rehydration. - Monitor BUN/creatinine closely.  3). Anemia of chronic disease - Patient was brought into the hospital today due to anemia with a hemoglobin of 6. CBC at this facility showed a hemoglobin of 7.3 which is consistent with his baseline. Anemia is of chronic disease. He is on iron replacement. - Check CBC in the morning. No urgent need for transfusion at this  time.  4). Hypokalemia - Potassium was low at 3.2. We'll check magnesium and replete potassium with 40 mEq IV. - Monitor potassium levels.  5). On total parenteral nutrition  - Continue TPN. Consult with nutrition services.  6).Type 2 diabetes mellitus - Patient has a history of type 2 diabetes and is only on sliding scale insulin. Continue sliding scale insulin. - Check A1c  All the records are reviewed and case discussed with ED provider. Management plans discussed with the patient, family and they are in agreement.  CODE STATUS:    Code Status Orders        Start     Ordered   11/03/15 2257  Limited resuscitation (code)   Continuous    Question Answer Comment  In the event of cardiac or respiratory ARREST: Initiate Code Blue, Call Rapid Response Yes   In the event of cardiac or respiratory ARREST: Perform CPR Yes   In the event of cardiac or respiratory ARREST: Perform Intubation/Mechanical Ventilation No   In the event of cardiac or respiratory ARREST: Use NIPPV/BiPAp only if indicated Yes   In the event of cardiac or respiratory ARREST: Administer ACLS medications if indicated Yes   In the event of cardiac or respiratory ARREST: Perform Defibrillation or Cardioversion  if indicated Yes      11/03/15 2257    Code Status History    Date Active Date Inactive Code Status Order ID Comments User Context   10/06/2015 11:10 AM 10/08/2015  5:42 AM DNR [id number]  Samella Parr, NP ED   10/06/2015  8:11 AM 10/06/2015 11:10 AM DNR [id number]  Samella Parr, NP ED   03/11/2015  4:17 PM 03/19/2015  4:48 PM Full Code [id number]  Burtis Junes, NP Inpatient   02/27/2015  3:07 PM 03/02/2015  5:16 PM Full Code [id number]  [name], MD Inpatient   02/27/2015  3:03 PM 02/27/2015  3:07 PM Full Code [id number]  [name], MD Inpatient   12/14/2011  4:45 PM 12/19/2011  6:38 PM Full Code [id number]  Marisa Sprinkles, RN Inpatient    Advance Directive Documentation        Most Recent Value   Type of Advance Directive  Healthcare Power of Attorney   Pre-existing out of facility DNR order (yellow form or pink MOST form)     "MOST" Form in Place?        I have independently reviewed all EKG and chest x-ray data  VTE prophylaxis: Heparin if no contraindications and patient at low risk for bleeding. SCD's and progressive ambulation if patient has contraindications to anticoagulants. No DVT prophylaxis if patient presently receiving therapeutic anticoagulation or is at significant risk of bleeding for which the risk of anticoagulation outweigh the potential benefits.  Vaccinations: Pneumonia & flu vaccine per hospital protocol Prevention: Will proceed with conservative measures for the prevention of delirium in patients older than 65. Fall precautions and 1:1 sitter as needed per hospital protocol.  TOTAL TIME TAKING CARE OF THIS PATIENT: 55 minutes.    Sylvan Cheese M.D on 11/03/2015 at 11:02 PM  Between 7am to 6pm - Pager - [phone]  After 6pm go to [email or web address] - password [email or web address] [facility]  Office  [phone]  CC: Primary care physician; Rachell Cipro, MD

## 2015-11-04 LAB — BASIC METABOLIC PANEL
ANION GAP: 5 (ref 5–15)
BUN: 50 mg/dL — ABNORMAL HIGH (ref 6–20)
CALCIUM: 8 mg/dL — AB (ref 8.9–10.3)
CHLORIDE: 106 mmol/L (ref 101–111)
CO2: 26 mmol/L (ref 22–32)
Creatinine, Ser: 2.16 mg/dL — ABNORMAL HIGH (ref 0.61–1.24)
GFR calc Af Amer: 35 mL/min — ABNORMAL LOW (ref >60)
GFR calc non Af Amer: 30 mL/min — ABNORMAL LOW (ref >60)
GLUCOSE: 140 mg/dL — AB (ref 65–99)
Potassium: 4.4 mmol/L (ref 3.5–5.1)
Sodium: 137 mmol/L (ref 135–145)

## 2015-11-04 LAB — CBC
HCT: 20.2 % — ABNORMAL LOW (ref 40.0–52.0)
Hemoglobin: 6.5 g/dL — ABNORMAL LOW (ref 13.0–18.0)
MCH: 27.5 pg (ref 26.0–34.0)
MCHC: 32.3 g/dL (ref 32.0–36.0)
MCV: 85.2 fL (ref 80.0–100.0)
Platelets: 236 10*3/uL (ref 150–440)
RBC: 2.38 MIL/uL — AB (ref 4.40–5.90)
RDW: 16.9 % — ABNORMAL HIGH (ref 11.5–14.5)
WBC: 7 10*3/uL (ref 3.8–10.6)

## 2015-11-04 LAB — GLUCOSE, CAPILLARY
GLUCOSE-CAPILLARY: 141 mg/dL — AB (ref 65–99)
Glucose-Capillary: 133 mg/dL — ABNORMAL HIGH (ref 65–99)
Glucose-Capillary: 108 mg/dL — ABNORMAL HIGH (ref 65–99)

## 2015-11-04 LAB — PREPARE RBC (CROSSMATCH)

## 2015-11-04 LAB — MRSA PCR SCREENING: MRSA BY PCR: POSITIVE — AB

## 2015-11-04 MED ORDER — SODIUM CHLORIDE 0.9% FLUSH
10.0000 mL | Freq: Two times a day (BID) | INTRAVENOUS | Status: DC
  Administered 2015-11-04 – 2015-11-09 (×7): 10 mL

## 2015-11-04 MED ORDER — OXYCODONE HCL 5 MG PO TABS
5.0000 mg | ORAL_TABLET | ORAL | Status: DC | PRN
Start: 2015-11-04 — End: 2015-11-04

## 2015-11-04 MED ORDER — FAMOTIDINE 20 MG PO TABS
20.0000 mg | ORAL_TABLET | Freq: Every day | ORAL | Status: DC
  Administered 2015-11-05 – 2015-11-09 (×5): 20 mg via ORAL
  Filled 2015-11-04 (×6): qty 1

## 2015-11-04 MED ORDER — ASPIRIN EC 81 MG PO TBEC
81.0000 mg | DELAYED_RELEASE_TABLET | Freq: Every day | ORAL | Status: DC
  Filled 2015-11-04: qty 1

## 2015-11-04 MED ORDER — SODIUM CHLORIDE 0.9 % IV BOLUS (SEPSIS)
500.0000 mL | Freq: Once | INTRAVENOUS | Status: AC
  Administered 2015-11-04: 500 mL via INTRAVENOUS

## 2015-11-04 MED ORDER — INSULIN ASPART 100 UNIT/ML ~~LOC~~ SOLN
0.0000 [IU] | Freq: Four times a day (QID) | SUBCUTANEOUS | Status: DC
  Administered 2015-11-05 – 2015-11-06 (×6): 3 [IU] via SUBCUTANEOUS
  Administered 2015-11-06 – 2015-11-07 (×2): 2 [IU] via SUBCUTANEOUS
  Administered 2015-11-07 – 2015-11-08 (×4): 3 [IU] via SUBCUTANEOUS
  Administered 2015-11-08: 2 [IU] via SUBCUTANEOUS
  Administered 2015-11-09: 17:00:00 3 [IU] via SUBCUTANEOUS
  Administered 2015-11-09: 2 [IU] via SUBCUTANEOUS
  Administered 2015-11-09: 3 [IU] via SUBCUTANEOUS
  Administered 2015-11-09: 2 [IU] via SUBCUTANEOUS
  Filled 2015-11-04 (×4): qty 3
  Filled 2015-11-04: qty 2
  Filled 2015-11-04: qty 3
  Filled 2015-11-04: qty 2
  Filled 2015-11-04 (×3): qty 3
  Filled 2015-11-04 (×2): qty 2
  Filled 2015-11-04 (×2): qty 3
  Filled 2015-11-04: qty 2
  Filled 2015-11-04 (×2): qty 3
  Filled 2015-11-04: qty 2

## 2015-11-04 MED ORDER — GUAIFENESIN-DM 100-10 MG/5ML PO SYRP
5.0000 mL | ORAL_SOLUTION | ORAL | Status: DC | PRN
  Administered 2015-11-04 – 2015-11-09 (×6): 5 mL via ORAL
  Filled 2015-11-04 (×6): qty 5

## 2015-11-04 MED ORDER — SODIUM CHLORIDE 0.9% FLUSH
10.0000 mL | INTRAVENOUS | Status: DC | PRN
Start: 2015-11-04 — End: 2015-11-09

## 2015-11-04 MED ORDER — FERROUS SULFATE 325 (65 FE) MG PO TABS
325.0000 mg | ORAL_TABLET | Freq: Every day | ORAL | Status: DC
  Administered 2015-11-05 – 2015-11-09 (×5): 325 mg via ORAL
  Filled 2015-11-04 (×6): qty 1

## 2015-11-04 MED ORDER — SODIUM CHLORIDE 0.9 % IV BOLUS (SEPSIS): 500.0000 mL | Freq: Once | INTRAVENOUS | Status: AC

## 2015-11-04 MED ORDER — MIRTAZAPINE 15 MG PO TABS
15.0000 mg | ORAL_TABLET | Freq: Every day | ORAL | Status: DC
  Administered 2015-11-06 – 2015-11-08 (×3): 15 mg via ORAL
  Filled 2015-11-04 (×4): qty 1

## 2015-11-04 MED ORDER — LEVOTHYROXINE SODIUM 88 MCG PO TABS
88.0000 ug | ORAL_TABLET | Freq: Every day | ORAL | Status: DC
  Administered 2015-11-05 – 2015-11-09 (×5): 88 ug via ORAL
  Filled 2015-11-04 (×6): qty 1

## 2015-11-04 MED ORDER — OXYCODONE HCL 5 MG PO TABS: 5.0000 mg | ORAL_TABLET | ORAL | Status: DC | PRN

## 2015-11-04 MED ORDER — INSULIN ASPART 100 UNIT/ML ~~LOC~~ SOLN: 0.0000 [IU] | SUBCUTANEOUS | Status: DC

## 2015-11-04 MED ORDER — NAPHAZOLINE-GLYCERIN 0.012-0.2 % OP SOLN
2.0000 [drp] | Freq: Four times a day (QID) | OPHTHALMIC | Status: DC | PRN
  Filled 2015-11-04: qty 15

## 2015-11-04 MED ORDER — SODIUM CHLORIDE 0.9 % IV SOLN
Freq: Once | INTRAVENOUS | Status: AC
  Administered 2015-11-04: 11:00:00 via INTRAVENOUS

## 2015-11-04 MED ORDER — NITROGLYCERIN 0.4 MG SL SUBL: 0.4000 mg | SUBLINGUAL_TABLET | SUBLINGUAL | Status: DC | PRN

## 2015-11-04 MED ORDER — METOPROLOL TARTRATE 25 MG PO TABS
25.0000 mg | ORAL_TABLET | Freq: Two times a day (BID) | ORAL | Status: DC
  Filled 2015-11-04: qty 1

## 2015-11-04 MED ORDER — MELATONIN 3 MG PO TABS: 6.0000 mg | ORAL_TABLET | Freq: Every day | ORAL | Status: DC

## 2015-11-04 MED ORDER — VANCOMYCIN HCL 10 G IV SOLR
1500.0000 mg | INTRAVENOUS | Status: DC
  Administered 2015-11-04: 1500 mg via INTRAVENOUS
  Filled 2015-11-04 (×2): qty 1500

## 2015-11-04 MED ORDER — DILTIAZEM HCL 25 MG/5ML IV SOLN
INTRAVENOUS | Status: AC
  Filled 2015-11-04: qty 5

## 2015-11-04 MED ORDER — DILTIAZEM HCL 25 MG/5ML IV SOLN
10.0000 mg | Freq: Once | INTRAVENOUS | Status: AC
  Administered 2015-11-04: 10 mg via INTRAVENOUS

## 2015-11-04 MED ORDER — LAMOTRIGINE 25 MG PO TABS
50.0000 mg | ORAL_TABLET | Freq: Every day | ORAL | Status: DC
Start: 2015-11-07 — End: 2015-11-09
  Administered 2015-11-07 – 2015-11-09 (×3): 50 mg via ORAL
  Filled 2015-11-04 (×3): qty 2

## 2015-11-04 MED ORDER — TRACE MINERALS CR-CU-MN-SE-ZN 10-1000-500-60 MCG/ML IV SOLN
INTRAVENOUS | Status: AC
  Administered 2015-11-04: 18:00:00 via INTRAVENOUS
  Filled 2015-11-04: qty 1200

## 2015-11-04 MED ORDER — LAMOTRIGINE 25 MG PO TABS
25.0000 mg | ORAL_TABLET | Freq: Every day | ORAL | Status: DC
  Administered 2015-11-06 – 2015-11-08 (×3): 25 mg via ORAL
  Filled 2015-11-04 (×4): qty 1

## 2015-11-04 MED ORDER — ENOXAPARIN SODIUM 40 MG/0.4ML ~~LOC~~ SOLN
40.0000 mg | SUBCUTANEOUS | Status: DC
  Administered 2015-11-04: 40 mg via SUBCUTANEOUS
  Filled 2015-11-04: qty 0.4

## 2015-11-04 NOTE — Progress Notes (Signed)
Patient has wound vac from home canister is full. We do not have extra canisters for home wound vac. Dr. Bridgett Larsson notified orders to switch wound vac to hospital wound vac ordered. Will proceed to change canister.

## 2015-11-04 NOTE — Progress Notes (Signed)
Initial Nutrition Assessment  INTERVENTION:   PN: consult received for initiation of TPN. Pt with right double lumen PICC line used for chronic TPN administration. Recommend starting 5%AA/20%Dextrose at rate of 50 ml/hr; goal of 100 ml/hr providing 2400 mL, 120 g of protein, 2112 kcals. Recommend addition of 20%lipids 2x weekly providing additional 285 kcals per day on average. Pharmacy consult to assist with electrolyte and glucose management. Daily weights, I/O every shift, TPN panel in AM. Continue to assess Meals and Snacks: recommend trial of ice chips with sips of water as this is pt's only request. Pt reports he is miserable and all he wants is a few sips of water. Caryl Pina RN aware of request and RN discussed with MD  NUTRITION DIAGNOSIS:   Inadequate oral intake related to altered GI function, chronic illness as evidenced by NPO status.  GOAL:   Patient will meet greater than or equal to 90% of their needs  MONITOR:   Labs, Weight trends, I & O's, Skin (TPN tolerance)  REASON FOR ASSESSMENT:   Consult New TPN/TNA  ASSESSMENT:    Pt admitted with sepsis, acute on chronic renal insufficiency, anemia requiring PRBC infusions. 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 Pt appeared very frustrated on visit today, unhappy with medical situation  Past Medical History  Diagnosis Date  . Hypertension   . Pulmonary embolism ([place]) 12-03-11    01-17-10 s/p DVT left leg -Coumadin tx.until 08-28-11  . Hyperlipidemia   . Leg swelling 12-03-11    bilateral if up most of day,equally swells, up to knee level, improved now  . Fractures 12-03-11    s/p right ankle and right wrist- no problems now  . Hypothyroidism   . Dyslipidemia   . Anemia 12-03-11    tx. oral iron supplement  . Arthritis 12-03-11    mild lower back  . Malignant neoplasm of sigmoid (flexure) ([place]) 10/08/2011  . Colon cancer ([place]) 12/14/11    Low anterior resection of mid/proximal  tumor=invasive adenocarcinoma,invaing through the muscularis propria into pericolonic fatty tissue  . Chronic kidney disease 12-03-11    kidney stone x1 ,none recent stage III  . Secondary hyperparathyroidism, renal ([place])   . Chronic anticoagulation   . Shortness of breath 12-03-11    hx. 6'11- during pulmonary emboli episode, none now, extreme exertion only.  . Morbid obesity ([place])   . Vitamin D deficiency   . Chronic pulmonary edema   . Dorsalgia   . Acute MI, true posterior wall, initial episode of care ([place]) 02/27/2015  . Malignant neoplasm of rectum ([place])   . Failure to thrive in adult   . Anxiety      Diet Order:  Diet NPO time specified .TPN (CLINIMIX-E) Adult   PN: pt on chronic TPN, pt reports he has been on TPN since November and has only been allowed ice chips and water. Pt requesting water on visit today, pt reports all he wants his water and if he cant have water he might as well not even be here.  Skin:   (no pressure ulcers)  Last BM:  no documented stool via ostomy   Digestive System: colostomy LUQ, BS active, some N/V at times per pt   Recent Labs Lab 11/03/15 1609 11/03/15 1629 11/04/15 0509  NA 134*  --  137  K 3.2*  --  4.4  CL 98*  --  106  CO2 28  --  26  BUN  58*  --  50*  CREATININE 2.03*  --  2.16*  CALCIUM 8.3*  --  8.0*  MG  --  2.3  --   GLUCOSE 119*  --  140*    Glucose Profile:   Recent Labs  11/04/15 0731 11/04/15 1123  GLUCAP 133* 141*    Meds: ss novolog, remeron, NS at 100 ml/hr  Height:   Ht Readings from Last 1 Encounters:  11/03/15 5\' 11"  (1.803 m)    Weight: 4.7% wt loss in 1 month per wt encounters, 20.6% wt loss in 5-6 months per wt encounters  Wt Readings from Last 1 Encounters:  11/04/15 285 lb 6.4 oz (129.457 kg)   Wt Readings from Last 10 Encounters:  11/04/15 285 lb 6.4 oz (129.457 kg)  10/07/15 299 lb 9.7 oz (135.9 kg)  09/27/15 295 lb 6.4 oz (133.993 kg)  05/23/15 353 lb (160.12 kg)  05/05/15 358 lb  4.8 oz (162.524 kg)  04/25/15 355 lb 11.2 oz (161.344 kg)  04/06/15 351 lb 3.2 oz (159.303 kg)  03/31/15 355 lb 1.9 oz (161.081 kg)  03/25/15 362 lb 1.6 oz (164.247 kg)  03/24/15 359 lb 9.6 oz (163.113 kg)     BMI:  Body mass index is 39.82 kg/(m^2).  Estimated Nutritional Needs:   Kcal:  2200-2400 kcals   Protein:  160-180 g  Fluid:  >2.2 L  EDUCATION NEEDS:   No education needs identified at this time  [place], [place], [place] [phone] Pager  [phone] Weekend/On-Call Pager

## 2015-11-04 NOTE — Progress Notes (Signed)
PHARMACIST - PHYSICIAN ORDER COMMUNICATION  CONCERNING: P&T Medication Policy on Herbal Medications  DESCRIPTION:  This patient's order for:  melatonin  has been noted.  This product(s) is classified as an "herbal" or natural product. Due to a lack of definitive safety studies or FDA approval, nonstandard manufacturing practices, plus the potential risk of unknown drug-drug interactions while on inpatient medications, the Pharmacy and Therapeutics Committee does not permit the use of "herbal" or natural products of this type within [facility].   ACTION TAKEN: The pharmacy department is unable to verify this order at this time. Please reevaluate patient's clinical condition at discharge and address if the herbal or natural product(s) should be resumed at that time.   

## 2015-11-04 NOTE — Progress Notes (Signed)
PARENTERAL NUTRITION CONSULT NOTE - INITIAL  Pharmacy Consult for Electrolyte Supplementation/Glucose Management Indication: TPN administration  No Known Allergies  Patient Measurements: Height: 5\' 11"  (180.3 cm) Weight: 285 lb 6.4 oz (129.457 kg) IBW/kg (Calculated) : 75.3   Vital Signs: Temp: 99.7 F (37.6 C) (03/31 1349) Temp Source: Oral (03/31 1349) BP: 86/57 mmHg (03/31 1349) Pulse Rate: 110 (03/31 1349) Intake/Output from previous day: 03/30 0701 - 03/31 0700 In: 1451.7 [I.V.:651.7; IV Piggyback:800] Out: 1150 [Urine:1150] Intake/Output from this shift: Total I/O In: 402 [I.V.:100; Blood:302] Out: 300 [Urine:300]  Labs:  Recent Labs  11/03/15 1609 11/04/15 0509  WBC 7.1 7.0  HGB 7.3* 6.5*  HCT 22.9* 20.2*  PLT 266 236     Recent Labs  11/03/15 1609 11/03/15 1629 11/04/15 0509  NA 134*  --  137  K 3.2*  --  4.4  CL 98*  --  106  CO2 28  --  26  GLUCOSE 119*  --  140*  BUN 58*  --  50*  CREATININE 2.03*  --  2.16*  CALCIUM 8.3*  --  8.0*  MG  --  2.3  --   PROT 7.2  --   --   ALBUMIN 1.9*  --   --   AST 53*  --   --   ALT 57  --   --   ALKPHOS 184*  --   --   BILITOT 0.9  --   --    Estimated Creatinine Clearance: 46.8 mL/min (by C-G formula based on Cr of 2.16).    Recent Labs  11/04/15 0731 11/04/15 1123  GLUCAP 133* 141*    Medical History: Past Medical History  Diagnosis Date  . Hypertension   . Pulmonary embolism ([place]) 12-03-11    01-17-10 s/p DVT left leg -Coumadin tx.until 08-28-11  . Hyperlipidemia   . Leg swelling 12-03-11    bilateral if up most of day,equally swells, up to knee level, improved now  . Fractures 12-03-11    s/p right ankle and right wrist- no problems now  . Hypothyroidism   . Dyslipidemia   . Anemia 12-03-11    tx. oral iron supplement  . Arthritis 12-03-11    mild lower back  . 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 Colon cancer ([place]) 12/14/11    Low anterior resection of  mid/proximal tumor=invasive adenocarcinoma,invaing through the muscularis propria into pericolonic fatty tissue  . Chronic kidney disease 12-03-11    kidney stone x1 ,none recent stage III  . Secondary hyperparathyroidism, renal ([place])   . Chronic anticoagulation   . Shortness of breath 12-03-11    hx. 6'11- during pulmonary emboli episode, none now, extreme exertion only.  . Morbid obesity ([place])   . Vitamin D deficiency   . Chronic pulmonary edema   . Dorsalgia   . Acute MI, true posterior wall, initial episode of care ([place]) 02/27/2015  . 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 Failure to thrive in adult   . Anxiety      Insulin Requirements in the past 24 hours:  Will start SSI q4h  Current Nutrition:  Clinimix-E at 50 mL/hr  Assessment: Pharmacy consulted to replace electrolytes and glucose management in a 65 yo male previously on TPN as outpatient.    Electrolytes WNL today  Plan:  No supplementation warranted at this point. SSI q4h ordered to assess insulin needs. Will check CMP/magnesium/phosphorus with AM.   Pharmacy will continue to follow.  Anise Harbin G 11/04/2015,2:04 PM

## 2015-11-04 NOTE — Care Management Important Message (Signed)
Important Message  Patient Details  Name: Richard Warner MRN: [id number] Date of Birth: [birth year]   Medicare Important Message Given:  Yes    Jolly Mango, RN 11/04/2015, 12:13 PM

## 2015-11-04 NOTE — Progress Notes (Signed)
Patient post blood administration. BP 84/56. Dr Bridgett Larsson notified. 500 cc bolus ordered.

## 2015-11-04 NOTE — Care Management (Signed)
Patient from [facility] care. CSW following.

## 2015-11-04 NOTE — Progress Notes (Signed)
Patient appears depressed and upset about his situation and health, this nurse provided comfort and listened to patient concerns. Requesting sips of water and ice chips. Dr Bridgett Larsson notified no orders received will continue to monitor patient.

## 2015-11-04 NOTE — Progress Notes (Addendum)
[facility] [place] NAME: Richard Warner    MR#:  [id number]  DATE OF BIRTH:  April 08, 1951  SUBJECTIVE:  CHIEF COMPLAINT:   Chief Complaint  Patient presents with  . Abnormal Lab   Cough. Blood pressure is low at 80s this morning and this p.m. He was treated with PRBC transfusion 1 unit and normal saline bolus. REVIEW OF SYSTEMS:  CONSTITUTIONAL: No fever, +  weakness.  EYES: No blurred or double vision.  EARS, NOSE, AND THROAT: No tinnitus or ear pain.  RESPIRATORY: No cough, shortness of breath, wheezing or hemoptysis.  CARDIOVASCULAR: No chest pain, orthopnea, edema.  GASTROINTESTINAL: No nausea, vomiting, diarrhea or abdominal pain.  GENITOURINARY: No dysuria, hematuria.  ENDOCRINE: No polyuria, nocturia,  HEMATOLOGY: No anemia, easy bruising or bleeding SKIN: No rash or lesion. MUSCULOSKELETAL: + Back pain NEUROLOGIC: No tingling, numbness, weakness.  PSYCHIATRY: + depression.   DRUG ALLERGIES:  No Known Allergies  VITALS:  Blood pressure 86/46, pulse 110, temperature 99.6 F (37.6 C), temperature source Oral, resp. rate 18, height 5\' 11"  (1.803 m), weight 129.457 kg (285 lb 6.4 oz), SpO2 100 %.  PHYSICAL EXAMINATION:  GENERAL:  65 y.o.-year-old patient lying in the bed with no acute distress. Obesity. EYES: Pupils equal, round, reactive to light and accommodation. No scleral icterus. Extraocular muscles intact.  HEENT: Head atraumatic, normocephalic. Oropharynx and nasopharynx clear.  NECK:  Supple, no jugular venous distention. No thyroid enlargement, no tenderness.  LUNGS: Normal breath sounds bilaterally, no wheezing, rales,rhonchi or crepitation. No use of accessory muscles of respiration.  CARDIOVASCULAR: S1, S2 normal. No murmurs, rubs, or gallops.  ABDOMEN: Soft, nontender, nondistended. Bowel sounds present. No organomegaly or mass. urostomy and colostomy appear normal. Wound VAC present in midline.   EXTREMITIES: No pedal edema, cyanosis, or clubbing.  NEUROLOGIC: Cranial nerves II through XII are intact. Muscle strength 3/5 in all extremities. Sensation intact. Gait not checked.  PSYCHIATRIC: The patient is alert and oriented x 3.  SKIN: No obvious rash, lesion, or ulcer.    LABORATORY PANEL:   CBC  Recent Labs Lab 11/04/15 0509  WBC 7.0  HGB 6.5*  HCT 20.2*  PLT 236   ------------------------------------------------------------------------------------------------------------------  Chemistries   Recent Labs Lab 11/03/15 1609 11/03/15 1629 11/04/15 0509  NA 134*  --  137  K 3.2*  --  4.4  CL 98*  --  106  CO2 28  --  26  GLUCOSE 119*  --  140*  BUN 58*  --  50*  CREATININE 2.03*  --  2.16*  CALCIUM 8.3*  --  8.0*  MG  --  2.3  --   AST 53*  --   --   ALT 57  --   --   ALKPHOS 184*  --   --   BILITOT 0.9  --   --    ------------------------------------------------------------------------------------------------------------------  Cardiac Enzymes  Recent Labs Lab 11/03/15 1609  TROPONINI 0.06*   ------------------------------------------------------------------------------------------------------------------  RADIOLOGY:  Dg Chest Portable 1 View  11/03/2015  CLINICAL DATA:  Weakness EXAM: PORTABLE CHEST 1 VIEW COMPARISON:  10/06/2015 FINDINGS: Cardiac shadow is mildly enlarged but stable. A right-sided jugular central line is again seen and stable. The lungs are well aerated bilaterally. No focal infiltrate or bony abnormality is seen. IMPRESSION: No active disease. Electronically Signed   By: Inez Catalina M.D.   On: 11/03/2015 16:41    EKG:   Orders placed or performed during the hospital  encounter of 11/03/15  . EKG 12-Lead  . EKG 12-Lead  . ED EKG  . ED EKG    ASSESSMENT AND PLAN:   1). Sepsis with UTI Continue Etarpenem (due to history of enterococcal bacteremia treated with these antibiotics),  follow upurine cultures and blood cultures.  Discontinued vancomycin after discussing with the pharmacist.  * Hypotension. Possible due to sepsis and hypovolemia. The patient was with normal saline bolus, but blood pressure is still low at 80s. I ordered another normal saline bolus. Continue normal saline IV. May need Levophed when necessary. Hold Lopressor.  2). Acute on chronic renal insufficiency - Patient has a creatinine of 2.03 from a baseline of 1.58 and a BUN of 58 from a baseline of 37. This is mostly likely due to inadequate fluid intake.  Continue normal saline IV. follow-up BMP.  3). Anemia of chronic disease - Patient was brought into the hospital today due to anemia with a hemoglobin of 6.  On admission, hemoglobin was 7.3 which is consistent with his baseline. Hemoglobin decreased to 6.5 this morning, possible due to IV fluid dilution. No active bleeding. The patient got 1 unit PRBC transfusion. Follow-up hemoglobin in the morning.   On iron replacement.  4). Hypokalemia Improved with potassium 40 mEq IV.  5). On total parenteral nutrition  - Continue TPN. Consult with nutrition services.  6).Type 2 diabetes mellitus  Continue sliding scale insulin.    All the records are reviewed and case discussed with Care Management/Social Workerr. Management plans discussed with the patient, his mother and they are in agreement.  CODE STATUS: Partial code.  TOTAL CRITICAL TIME TAKING CARE OF THIS PATIENT: 48 minutes.  Greater than 50% time was spent on coordination of care and face-to-face counseling.  POSSIBLE D/C IN >3 DAYS, DEPENDING ON CLINICAL CONDITION.   Demetrios Loll M.D on 11/04/2015 at 4:58 PM  Between 7am to 6pm - Pager - [phone]  After 6pm go to [email or web address] - password [email or web address] [place] [facility]  Office  [phone]  CC: Primary care physician; Rachell Cipro, MD

## 2015-11-04 NOTE — Progress Notes (Signed)
NOtified Dr. Estanislado Pandy of heart rate staying in the 120s, IV cardizem ordered.

## 2015-11-04 NOTE — Progress Notes (Signed)
Bp 86/46 dr. Bridgett Larsson notified 500 cc bolus order received. Will continue to monitor. Patient lung sounds clear and diminished in the bases.

## 2015-11-04 NOTE — Clinical Social Work Note (Signed)
Clinical Social Work Assessment  Patient Details  Name: Richard Warner MRN: [id number] Date of Birth: [birth year]  Date of referral:  11/04/15               Reason for consult:  Discharge Planning                Permission sought to share information with:  Family Supports Permission granted to share information::  Yes, Verbal Permission Granted  Name::        Agency::   [facility])  Relationship::   (Erler,Lillian- Mother)  Contact Information:     Housing/Transportation Living arrangements for the past 2 months:  [place] of Information:  Patient, Other (Comment Required) (Aydin,Lillian- Mother) Patient Interpreter Needed:  None Criminal Activity/Legal Involvement Pertinent to Current Situation/Hospitalization:  No - Comment as needed Significant Relationships:  Parents Lives with:  Facility Resident Do you feel safe going back to the place where you live?  Yes Need for family participation in patient care:  Yes (Comment) (Baum,Lillian- Mother)  Care giving concerns: Patient is from [facility] [place].    Social Worker assessment / plan:  CSW was informed by RN that patient is from [facility]. CSW met with patient and his mother at bedside. Verbal permission granted to speak about discharge plans in front of his mother. CSW introduced herself and her role. Per patient's mother patient is from [facility]. Stated that patient has been in [facility] since October 17, 2015 an they are working on LTC placement at the facility. Reports hat patient can return at discharge to [facility] if they accept him back. Reports that patient would need to be transported via EMS at discharge. Verbal Permission to speak to [facility] and coordinate discharge.   CSW spoke to [place]- admissions at [facility]. She reports that they ae working on LTC placement for patient.   FL2/ PASRR completed and faxed to [facility]. CSW will continue to follow and assist.   Employment status:  Retired Forensic scientist:   Medicare PT Recommendations:  Not assessed at this time Information / Referral to community resources:     Patient/Family's Response to care:  Patient and his mother are in agreement for patient to return to [facility].   Patient/Family's Understanding of and Emotional Response to Diagnosis, Current Treatment, and Prognosis:  Reports they understand patient's prognosis. Stated they understands CSW's role and is appreciative of her assistance.   Emotional Assessment Appearance:  Appears older than stated age Attitude/Demeanor/Rapport:   (None) Affect (typically observed):  Calm, Accepting Orientation:  Oriented to Self, Oriented to Place, Oriented to  Time, Oriented to Situation Alcohol / Substance use:  Not Applicable Psych involvement (Current and /or in the community):  No (Comment)  Discharge Needs  Concerns to be addressed:  Discharge Planning Concerns Readmission within the last 30 days:  No Current discharge risk:  Chronically ill Barriers to Discharge:  Continued Medical Work up   [employer], LCSW 11/04/2015, 4:17 PM

## 2015-11-04 NOTE — Progress Notes (Signed)
Alert with some confusion and forgetfulness. Admitted with UTI and sepsis. Wound vac in place mid abdomen. Urostomy and colostomy in place. PICC in place, however was told in report that we are not to draw labs from this. Unable to verify this with patient. Patient is total care. IV fluids and IV antibiotics given.

## 2015-11-04 NOTE — NC FL2 (Signed)
[place] LEVEL OF CARE SCREENING TOOL     IDENTIFICATION  Patient Name: Richard Warner Birthdate: 1951-04-22 Sex: male Admission Date (Current Location): 11/03/2015  [place] and [place] Number:  Engineering geologist and Address:  [facility], [address], [place], [place] [address]      Provider Number: [id number]  Attending Physician Name and Address:  Demetrios Loll, MD  Relative Name and Phone Number:       Current Level of Care: Hospital Recommended Level of Care: [place] Prior Approval Number:    Date Approved/Denied:   PASRR Number:  ([id number])  Discharge Plan: SNF    Current Diagnoses: Patient Active Problem List   Diagnosis Date Noted  . Anemia of chronic disease 11/03/2015  . Acute on chronic renal insufficiency ([place]) 11/03/2015  . Hypokalemia 11/03/2015  . Type 2 diabetes mellitus ([place]) 11/03/2015  . On total parenteral nutrition (TPN) 11/03/2015  . Enterococcal bacteremia 10/07/2015  . Sepsis ([place]) 10/06/2015  . 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 Open wnd anterior abdomen 06/25/2015  . Chronic pulmonary embolism ([place])   . 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 Encounter for therapeutic drug monitoring 09/01/2013  . Rectal cancer, pT3pN0 (0/12 LN) h/o LAR and chemorad; recurred Nov 2016 s/p total cystectomy 12/19/2011  . Iron deficiency anemia, unspecified 09/12/2011  . History of DVT and recurrent PE 02/22/2010  . Hypothyroidism 02/21/2010  . HLD (hyperlipidemia) 02/21/2010  . Obesity, Class III, BMI 40-49.9 (morbid obesity) ([place]) 02/21/2010  . Essential hypertension 02/21/2010  . RENAL DISEASE, CHRONIC, STAGE III 02/21/2010    Orientation RESPIRATION BLADDER Height & Weight     Self, Time, Situation, Place  Normal Continent Weight: 285 lb 6.4 oz (129.457 kg) Height:  5\' 11"  (180.3 cm)  BEHAVIORAL SYMPTOMS/MOOD NEUROLOGICAL BOWEL NUTRITION STATUS    (None)  (None) Continent Diet  AMBULATORY STATUS COMMUNICATION OF NEEDS Skin   Extensive Assist Verbally Wound Vac, Other (Comment) (Skin tear right hand& Negative Pressure Surgical Wound Abdomen )                       Personal Care Assistance Level of Assistance  Bathing, Feeding, Dressing Bathing Assistance: Limited assistance Feeding assistance: Limited assistance Dressing Assistance: Limited assistance     Functional Limitations Info  Sight, Hearing, Speech Sight Info: Adequate Hearing Info: Adequate Speech Info: Adequate    SPECIAL CARE FACTORS FREQUENCY  PT (By licensed PT)     PT Frequency:  (5)              Contractures      Additional Factors Info  Code Status, Allergies, Insulin Sliding Scale, Isolation Precautions Code Status Info:  (Partial Code- DO NOT Perform Intubation/Mechanical Ventilation) Allergies Info:  (No Known Allergies )   Insulin Sliding Scale Info:  (insulin aspart (novoLOG) injection 0-15 Units 0-15 Units, Subcutaneous, 3 times daily with meals) Isolation Precautions Info:  (Contact precautions- MRSA PCR +)     Current Medications (11/04/2015):  This is the current hospital active medication list Current Facility-Administered Medications  Medication Dose Route Frequency Provider Last Rate Last Dose  . 0.9 %  sodium chloride infusion   Intravenous Continuous Sylvan Cheese, MD 100 mL/hr at 11/03/15 2302    . aspirin EC tablet 81 mg  81 mg Oral Daily Sylvan Cheese, MD   81 mg at 11/04/15 0832  . diltiazem (CARDIZEM) 25 MG/5ML  injection           . famotidine (PEPCID) tablet 20 mg  20 mg Oral Daily Sylvan Cheese, MD   20 mg at 11/04/15 [id number]  . ferrous sulfate tablet 325 mg  325 mg Oral Q breakfast Sylvan Cheese, MD   325 mg at 11/04/15 [id number]  . heparin injection 5,000 Units  5,000 Units Subcutaneous 3 times per day Sylvan Cheese, MD   5,000 Units at 11/04/15 0154  . imipenem-cilastatin (PRIMAXIN) 500 mg in sodium chloride 0.9 %  100 mL IVPB  500 mg Intravenous 3 times per day Sylvan Cheese, MD 200 mL/hr at 11/04/15 0828 500 mg at 11/04/15 0828  . insulin aspart (novoLOG) injection 0-15 Units  0-15 Units Subcutaneous TID WC Sylvan Cheese, MD   2 Units at 11/04/15 0839  . lamoTRIgine (LAMICTAL) tablet 25 mg  25 mg Oral QHS Sylvan Cheese, MD   25 mg at 11/04/15 0126  . [START ON 11/07/2015] lamoTRIgine (LAMICTAL) tablet 50 mg  50 mg Oral Daily Sylvan Cheese, MD      . levothyroxine (SYNTHROID, LEVOTHROID) tablet 88 mcg  88 mcg Oral QAC breakfast Sylvan Cheese, MD   88 mcg at 11/04/15 [id number]  . metoprolol tartrate (LOPRESSOR) tablet 25 mg  25 mg Oral BID Sylvan Cheese, MD   25 mg at 11/04/15 0127  . mirtazapine (REMERON) tablet 15 mg  15 mg Oral QHS Sylvan Cheese, MD   15 mg at 11/04/15 0127  . morphine 2 MG/ML injection 2 mg  2 mg Intravenous Q4H PRN Sylvan Cheese, MD      . naphazoline-glycerin (CLEAR EYES) ophth solution 2 drop  2 drop Both Eyes QID PRN Sylvan Cheese, MD      . nitroGLYCERIN (NITROSTAT) SL tablet 0.4 mg  0.4 mg Sublingual Q5 min PRN Sylvan Cheese, MD      . oxyCODONE (Oxy IR/ROXICODONE) immediate release tablet 5 mg  5 mg Oral Q4H PRN Sylvan Cheese, MD      . sodium chloride flush (NS) 0.9 % injection 10-40 mL  10-40 mL Intracatheter Q12H Sylvan Cheese, MD   10 mL at 11/04/15 0840  . sodium chloride flush (NS) 0.9 % injection 10-40 mL  10-40 mL Intracatheter PRN Sylvan Cheese, MD      . vancomycin (VANCOCIN) 1,500 mg in sodium chloride 0.9 % 500 mL IVPB  1,500 mg Intravenous Q18H Sylvan Cheese, MD   1,500 mg at 11/04/15 1126     Discharge Medications: Please see discharge summary for a list of discharge medications.  Relevant Imaging Results:  Relevant Lab Results:   Additional Information  (SSN [id number])  Lorenso Quarry [place], LCSW

## 2015-11-05 LAB — COMPREHENSIVE METABOLIC PANEL
ALK PHOS: 116 U/L (ref 38–126)
ALT: 28 U/L (ref 17–63)
AST: 22 U/L (ref 15–41)
Albumin: 1.5 g/dL — ABNORMAL LOW (ref 3.5–5.0)
Anion gap: 3 — ABNORMAL LOW (ref 5–15)
BUN: 33 mg/dL — ABNORMAL HIGH (ref 6–20)
CALCIUM: 7.8 mg/dL — AB (ref 8.9–10.3)
CHLORIDE: 112 mmol/L — AB (ref 101–111)
CO2: 22 mmol/L (ref 22–32)
CREATININE: 1.84 mg/dL — AB (ref 0.61–1.24)
GFR, EST AFRICAN AMERICAN: 43 mL/min — AB (ref >60)
GFR, EST NON AFRICAN AMERICAN: 37 mL/min — AB (ref >60)
Glucose, Bld: 160 mg/dL — ABNORMAL HIGH (ref 65–99)
Potassium: 3.6 mmol/L (ref 3.5–5.1)
SODIUM: 137 mmol/L (ref 135–145)
Total Bilirubin: 0.8 mg/dL (ref 0.3–1.2)
Total Protein: 5.6 g/dL — ABNORMAL LOW (ref 6.5–8.1)

## 2015-11-05 LAB — CBC
HCT: 20.8 % — ABNORMAL LOW (ref 40.0–52.0)
HEMATOCRIT: 20.6 % — AB (ref 40.0–52.0)
HEMOGLOBIN: 6.8 g/dL — AB (ref 13.0–18.0)
Hemoglobin: 6.1 g/dL — ABNORMAL LOW (ref 13.0–18.0)
MCH: 26.7 pg (ref 26.0–34.0)
MCH: 27.7 pg (ref 26.0–34.0)
MCHC: 29.6 g/dL — ABNORMAL LOW (ref 32.0–36.0)
MCHC: 32.8 g/dL (ref 32.0–36.0)
MCV: 90 fL (ref 80.0–100.0)
MCV: 84.4 fL (ref 80.0–100.0)
PLATELETS: 203 10*3/uL (ref 150–440)
Platelets: 200 10*3/uL (ref 150–440)
RBC: 2.29 MIL/uL — AB (ref 4.40–5.90)
RBC: 2.46 MIL/uL — AB (ref 4.40–5.90)
RDW: 18.9 % — AB (ref 11.5–14.5)
RDW: 17 % — ABNORMAL HIGH (ref 11.5–14.5)
WBC: 3.8 10*3/uL (ref 3.8–10.6)
WBC: 4.1 10*3/uL (ref 3.8–10.6)

## 2015-11-05 LAB — TRIGLYCERIDES: Triglycerides: 252 mg/dL — ABNORMAL HIGH (ref <150)

## 2015-11-05 LAB — PROTIME-INR
INR: 1.22
PROTHROMBIN TIME: 15.6 s — AB (ref 11.4–15.0)

## 2015-11-05 LAB — APTT: aPTT: 34 seconds (ref 24–36)

## 2015-11-05 LAB — MAGNESIUM: Magnesium: 2 mg/dL (ref 1.7–2.4)

## 2015-11-05 LAB — GLUCOSE, CAPILLARY
GLUCOSE-CAPILLARY: 117 mg/dL — AB (ref 65–99)
GLUCOSE-CAPILLARY: 151 mg/dL — AB (ref 65–99)
GLUCOSE-CAPILLARY: 157 mg/dL — AB (ref 65–99)
Glucose-Capillary: 156 mg/dL — ABNORMAL HIGH (ref 65–99)
Glucose-Capillary: 158 mg/dL — ABNORMAL HIGH (ref 65–99)

## 2015-11-05 LAB — PREPARE RBC (CROSSMATCH)

## 2015-11-05 LAB — PHOSPHORUS: Phosphorus: 3.2 mg/dL (ref 2.5–4.6)

## 2015-11-05 LAB — FIBRINOGEN: Fibrinogen: 611 mg/dL — ABNORMAL HIGH (ref 210–470)

## 2015-11-05 MED ORDER — SODIUM CHLORIDE 0.9 % IV SOLN
Freq: Once | INTRAVENOUS | Status: AC
  Administered 2015-11-05: 11:00:00 via INTRAVENOUS

## 2015-11-05 MED ORDER — SODIUM CHLORIDE 0.9 % IV SOLN
INTRAVENOUS | Status: DC
  Administered 2015-11-05: 18:00:00 via INTRAVENOUS

## 2015-11-05 MED ORDER — TRACE MINERALS CR-CU-MN-SE-ZN 10-1000-500-60 MCG/ML IV SOLN
INTRAVENOUS | Status: AC
  Administered 2015-11-05: 18:00:00 via INTRAVENOUS
  Filled 2015-11-05: qty 2400

## 2015-11-05 MED ORDER — ENOXAPARIN SODIUM 40 MG/0.4ML ~~LOC~~ SOLN
40.0000 mg | Freq: Two times a day (BID) | SUBCUTANEOUS | Status: DC
  Administered 2015-11-05 – 2015-11-09 (×8): 40 mg via SUBCUTANEOUS
  Filled 2015-11-05 (×8): qty 0.4

## 2015-11-05 NOTE — Progress Notes (Signed)
Anticoagulation Policy-Lovenox Adjustment  Patient is a 65 yo male with orders for Lovenx 40 mg subq q24h.  Patient with est CrCl~56 mL/min and BMI of 41.5.  Per anticoagulation policy, will transition patient to Lovenox 40 mg subq q12h based on CrCl>30 mL/min and BMI >40.  Pharmacy will continue to monitor per policy.  Murrell Converse, PharmD Clinical Pharmacist 11/05/2015

## 2015-11-05 NOTE — Progress Notes (Signed)
Notified MD of AM hemoglobin of 6.1. Per Dr. Estanislado Pandy, transfuse one unit of PRBC's and place orders for follow-up labs post-transfusion. Nursing staff will continue to monitor. Earleen Reaper, RN

## 2015-11-05 NOTE — Progress Notes (Signed)
[facility] at [place] NAME: Richard Warner    MR#:  [id number]  DATE OF BIRTH:  [birth year]  SUBJECTIVE:  CHIEF COMPLAINT:   Chief Complaint  Patient presents with  . Abnormal Lab   No complaint except weakness.  Blood pressure is low at 100/54 this morning. REVIEW OF SYSTEMS:  CONSTITUTIONAL: No fever, +  weakness.  EYES: No blurred or double vision.  EARS, NOSE, AND THROAT: No tinnitus or ear pain.  RESPIRATORY: No cough, shortness of breath, wheezing or hemoptysis.  CARDIOVASCULAR: No chest pain, orthopnea, edema.  GASTROINTESTINAL: No nausea, vomiting, diarrhea or abdominal pain.  GENITOURINARY: No dysuria, hematuria.  ENDOCRINE: No polyuria, nocturia,  HEMATOLOGY: No anemia, easy bruising or bleeding SKIN: No rash or lesion. MUSCULOSKELETAL: no joint pain NEUROLOGIC: No tingling, numbness, weakness.  PSYCHIATRY: No depression or anxiety.  DRUG ALLERGIES:  No Known Allergies  VITALS:  Blood pressure 102/54, pulse 103, temperature 98.1 F (36.7 C), temperature source Oral, resp. rate 21, height 5\' 11"  (1.803 m), weight 133.72 kg (294 lb 12.8 oz), SpO2 95 %.  PHYSICAL EXAMINATION:  GENERAL:  65 y.o.-year-old patient lying in the bed with no acute distress. Obesity. EYES: Pupils equal, round, reactive to light and accommodation. No scleral icterus. Extraocular muscles intact.  HEENT: Head atraumatic, normocephalic. Oropharynx and nasopharynx clear.  NECK:  Supple, no jugular venous distention. No thyroid enlargement, no tenderness.  LUNGS: Normal breath sounds bilaterally, no wheezing, rales,rhonchi or crepitation. No use of accessory muscles of respiration.  CARDIOVASCULAR: S1, S2 normal. No murmurs, rubs, or gallops.  ABDOMEN: Soft, nontender, nondistended. Bowel sounds present. No organomegaly or mass. urostomy and colostomy appear normal. Wound VAC present in midline. No melena or bloody stool in  bag. EXTREMITIES: No pedal edema, cyanosis, or clubbing.  NEUROLOGIC: Cranial nerves II through XII are intact. Muscle strength 3/5 in all extremities. Sensation intact. Gait not checked.  PSYCHIATRIC: The patient is alert and oriented x 3.  SKIN: No obvious rash, lesion, or ulcer.    LABORATORY PANEL:   CBC  Recent Labs Lab 11/05/15 0535  WBC 3.8  HGB 6.1*  HCT 20.6*  PLT 200   ------------------------------------------------------------------------------------------------------------------  Chemistries   Recent Labs Lab 11/05/15 0729  NA 137  K 3.6  CL 112*  CO2 22  GLUCOSE 160*  BUN 33*  CREATININE 1.84*  CALCIUM 7.8*  MG 2.0  AST 22  ALT 28  ALKPHOS 116  BILITOT 0.8   ------------------------------------------------------------------------------------------------------------------  Cardiac Enzymes  Recent Labs Lab 11/03/15 1609  TROPONINI 0.06*   ------------------------------------------------------------------------------------------------------------------  RADIOLOGY:  Dg Chest Portable 1 View  11/03/2015  CLINICAL DATA:  Weakness EXAM: PORTABLE CHEST 1 VIEW COMPARISON:  10/06/2015 FINDINGS: Cardiac shadow is mildly enlarged but stable. A right-sided jugular central line is again seen and stable. The lungs are well aerated bilaterally. No focal infiltrate or bony abnormality is seen. IMPRESSION: No active disease. Electronically Signed   By: Inez Catalina M.D.   On: 11/03/2015 16:41    EKG:   Orders placed or performed during the hospital encounter of 11/03/15  . EKG 12-Lead  . EKG 12-Lead  . ED EKG  . ED EKG    ASSESSMENT AND PLAN:   1). Sepsis with UTI Continue Etarpenem (due to history of enterococcal bacteremia treated with these antibiotics),  follow upurine cultures and blood cultures. Discontinued vancomycin after discussing with the pharmacist.  * Hypotension. Possible due to sepsis and hypovolemia. Better. The patient was  given  normal saline bolus. Hold Lopressor. On NS IV.  2). Acute on chronic renal insufficiency - Patient has a creatinine of 2.03 from a baseline of 1.58 and a BUN of 58 from a baseline of 37. This is mostly likely due to inadequate fluid intake.  Cr. Down to 1.84, BUN down to 33 with normal saline IV. follow-up BMP.  3). Anemia of chronic disease - Patient was brought into the hospital due to anemia with a hemoglobin of 6.  On admission, hemoglobin was 7.3 which is consistent with his baseline. Hb was 6.5 and he got 1 unit PRBC transfusion. Hemoglobin decreased to 6.1 this morning, possible due to IV fluid dilution. No active bleeding. The patient got another 1 unit PRBC transfusion. Follow-up hemoglobin in the morning.   On iron replacement.  4). Hypokalemia Improved with potassium 40 mEq IV.  5). On total parenteral nutrition  - Continue TPN per dietitian.  6).Type 2 diabetes mellitus  Continue sliding scale insulin.    All the records are reviewed and case discussed with Care Management/Social Workerr. Management plans discussed with the patient, his mother and they are in agreement.  CODE STATUS: Partial code.  TOTAL TIME TAKING CARE OF THIS PATIENT: 38 minutes.  Greater than 50% time was spent on coordination of care and face-to-face counseling.  POSSIBLE D/C TO SNF IN 3 DAYS, DEPENDING ON CLINICAL CONDITION.   Demetrios Loll M.D on 11/05/2015 at 1:25 PM  Between 7am to 6pm - Pager - [phone]  After 6pm go to [email or web address] - password [email or web address] [facility]  Office  [phone]  CC: Primary care physician; Rachell Cipro, MD

## 2015-11-05 NOTE — Progress Notes (Signed)
Nutrition Follow-up  DOCUMENTATION CODES:   Morbid obesity  INTERVENTION:   - Recommend increasing TPN of 5%AA/20% dextrose to goal rate of 135ml/hr. Will plan to add 20% lipids two times per week next starting on 4/4. Spoke with Dr. Bridgett Larsson and agreeable to increasing rate of TPN to 170ml/hr today starting at 1800. MD wants current IV fluids rate to drop to 4ml/hr when TPN rate increased. Discussed with RN, Donnisha and orders modified.    NUTRITION DIAGNOSIS:   Inadequate oral intake related to altered GI function, chronic illness as evidenced by NPO status.    GOAL:   Patient will meet greater than or equal to 90% of their needs    MONITOR:   Labs, Weight trends, I & O's, Skin (TPN tolerance)  REASON FOR ASSESSMENT:   Consult New TPN/TNA  ASSESSMENT:    Pt reports has been on TPN since Nov 2016  Medications reviewed Labs reviewed - BUN 33, creatinine1.84, glucose 160, triglycerides 252  Output: 392ml from wound vac noted UOP: 1669ml noted  Diet Order:  .TPN (CLINIMIX-E) Adult Diet NPO time specified Except for: Sips with Meds .TPN (CLINIMIX-E) Adult  Skin:   (No pressure ulcers)  Last BM:  no documented stool via ostomy  Height:   Ht Readings from Last 1 Encounters:  11/03/15 5\' 11"  (1.803 m)    Weight:   Wt Readings from Last 1 Encounters:  11/05/15 294 lb 12.8 oz (133.72 kg)    Ideal Body Weight:     BMI:  Body mass index is 41.13 kg/(m^2).  Estimated Nutritional Needs:   Kcal:  2200-2400 kcals   Protein:  160-180 g  Fluid:  >2.2 L  EDUCATION NEEDS:   No education needs identified at this time  Richard Warner, [place], [place] (pager) Weekend/On-Call pager [phone])

## 2015-11-05 NOTE — Progress Notes (Signed)
PARENTERAL NUTRITION CONSULT NOTE - INITIAL  Pharmacy Consult for Electrolyte Supplementation/Glucose Management Indication: TPN administration  No Known Allergies  Patient Measurements: Height: 5\' 11"  (180.3 cm) Weight: 294 lb 12.8 oz (133.72 kg) IBW/kg (Calculated) : 75.3   Vital Signs: Temp: 97.6 F (36.4 C) (04/01 1116) Temp Source: Oral (04/01 1116) BP: 101/51 mmHg (04/01 1116) Pulse Rate: 105 (04/01 1116) Intake/Output from previous day: 03/31 0701 - 04/01 0700 In: 402 [I.V.:100; Blood:302] Out: 1600 [Urine:1600] Intake/Output from this shift: Total I/O In: -  Out: 525 [Urine:200; Drains:325]  Labs:  Recent Labs  11/03/15 1609 11/04/15 0509 11/05/15 0535  WBC 7.1 7.0 3.8  HGB 7.3* 6.5* 6.1*  HCT 22.9* 20.2* 20.6*  PLT 266 236 200     Recent Labs  11/03/15 1609 11/03/15 1629 11/04/15 0509 11/05/15 0729  NA 134*  --  137 137  K 3.2*  --  4.4 3.6  CL 98*  --  106 112*  CO2 28  --  26 22  GLUCOSE 119*  --  140* 160*  BUN 58*  --  50* 33*  CREATININE 2.03*  --  2.16* 1.84*  CALCIUM 8.3*  --  8.0* 7.8*  MG  --  2.3  --  2.0  PHOS  --   --   --  3.2  PROT 7.2  --   --  5.6*  ALBUMIN 1.9*  --   --  1.5*  AST 53*  --   --  22  ALT 57  --   --  28  ALKPHOS 184*  --   --  116  BILITOT 0.9  --   --  0.8  TRIG  --   --   --  252*   Estimated Creatinine Clearance: 55.9 mL/min (by C-G formula based on Cr of 1.84).    Recent Labs  11/04/15 1627 11/05/15 0003 11/05/15 0546  GLUCAP 108* 158* 156*    Medical History: Past Medical History  Diagnosis Date  . Hypertension   . Pulmonary embolism ([place]) 12-03-11    01-17-10 s/p DVT left leg -Coumadin tx.until 08-28-11  . Hyperlipidemia   . Leg swelling 12-03-11    bilateral if up most of day,equally swells, up to knee level, improved now  . Fractures 12-03-11    s/p right ankle and right wrist- no problems now  . Hypothyroidism   . Dyslipidemia   . Anemia 12-03-11    tx. oral iron supplement  .  Arthritis 12-03-11    mild lower back  . 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 Chronic kidney disease 12-03-11    kidney stone x1 ,none recent stage III  . Secondary hyperparathyroidism, renal ([place])   . Chronic anticoagulation   . Shortness of breath 12-03-11    hx. 6'11- during pulmonary emboli episode, none now, extreme exertion only.  . Morbid obesity ([place])   . Vitamin D deficiency   . Chronic pulmonary edema   . Dorsalgia   . Acute MI, true posterior wall, initial episode of care ([place]) 02/27/2015  . 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 Failure to thrive in adult   . Anxiety      Insulin Requirements in the past 24 hours:  Continue SSI q4 hours   Current Nutrition:  Clinimix-E at 50 mL/hr  Assessment: Pharmacy consulted to  replace electrolytes and glucose management in a 65 yo male previously on TPN as outpatient.    Electrolytes WNL today  Plan:  No supplementation warranted at this point. SSI q4h ordered to assess insulin needs. Will check CMP/magnesium/phosphorus with AM.   Pharmacy will continue to follow.   Awais,Ziah Turvey D 11/05/2015,11:44 AM

## 2015-11-06 LAB — CBC
HCT: 22.8 % — ABNORMAL LOW (ref 40.0–52.0)
HEMOGLOBIN: 7.5 g/dL — AB (ref 13.0–18.0)
MCH: 26.8 pg (ref 26.0–34.0)
MCHC: 32.9 g/dL (ref 32.0–36.0)
MCV: 81.4 fL (ref 80.0–100.0)
PLATELETS: 167 10*3/uL (ref 150–440)
RBC: 2.8 MIL/uL — AB (ref 4.40–5.90)
RDW: 16.8 % — ABNORMAL HIGH (ref 11.5–14.5)
WBC: 4.1 10*3/uL (ref 3.8–10.6)

## 2015-11-06 LAB — GLUCOSE, CAPILLARY
GLUCOSE-CAPILLARY: 132 mg/dL — AB (ref 65–99)
GLUCOSE-CAPILLARY: 154 mg/dL — AB (ref 65–99)
GLUCOSE-CAPILLARY: 166 mg/dL — AB (ref 65–99)
Glucose-Capillary: 156 mg/dL — ABNORMAL HIGH (ref 65–99)

## 2015-11-06 LAB — BASIC METABOLIC PANEL
ANION GAP: 3 — AB (ref 5–15)
BUN: 28 mg/dL — ABNORMAL HIGH (ref 6–20)
CHLORIDE: 114 mmol/L — AB (ref 101–111)
CO2: 19 mmol/L — ABNORMAL LOW (ref 22–32)
CREATININE: 1.51 mg/dL — AB (ref 0.61–1.24)
Calcium: 7.7 mg/dL — ABNORMAL LOW (ref 8.9–10.3)
GFR calc non Af Amer: 47 mL/min — ABNORMAL LOW (ref >60)
GFR, EST AFRICAN AMERICAN: 54 mL/min — AB (ref >60)
Glucose, Bld: 160 mg/dL — ABNORMAL HIGH (ref 65–99)
Potassium: 4.2 mmol/L (ref 3.5–5.1)
SODIUM: 136 mmol/L (ref 135–145)

## 2015-11-06 MED ORDER — CEFTRIAXONE SODIUM 1 G IJ SOLR
1.0000 g | INTRAMUSCULAR | Status: DC
  Filled 2015-11-06: qty 10

## 2015-11-06 MED ORDER — DEXTROSE 5 % IV SOLN
1.0000 g | INTRAVENOUS | Status: DC
  Administered 2015-11-06 – 2015-11-07 (×2): 1 g via INTRAVENOUS
  Filled 2015-11-06 (×2): qty 10

## 2015-11-06 MED ORDER — TRACE MINERALS CR-CU-MN-SE-ZN 10-1000-500-60 MCG/ML IV SOLN
INTRAVENOUS | Status: DC
  Administered 2015-11-06: 18:00:00 via INTRAVENOUS
  Filled 2015-11-06 (×2): qty 2400

## 2015-11-06 NOTE — Progress Notes (Signed)
[facility] at [place] NAME: Richard Warner    MR#:  [id number]  DATE OF BIRTH:  1951-07-02  SUBJECTIVE:  CHIEF COMPLAINT:   Chief Complaint  Patient presents with  . Abnormal Lab   No complaint.  Blood pressure is better at 114/66 this morning. REVIEW OF SYSTEMS:  CONSTITUTIONAL: No fever, +  weakness.  EYES: No blurred or double vision.  EARS, NOSE, AND THROAT: No tinnitus or ear pain.  RESPIRATORY: No cough, shortness of breath, wheezing or hemoptysis.  CARDIOVASCULAR: No chest pain, orthopnea, edema.  GASTROINTESTINAL: No nausea, vomiting, diarrhea or abdominal pain.  GENITOURINARY: No dysuria, hematuria.  ENDOCRINE: No polyuria, nocturia,  HEMATOLOGY: No anemia, easy bruising or bleeding SKIN: No rash or lesion. MUSCULOSKELETAL: no joint pain NEUROLOGIC: No tingling, numbness, weakness.  PSYCHIATRY: No depression or anxiety.  DRUG ALLERGIES:  No Known Allergies  VITALS:  Blood pressure 114/66, pulse 109, temperature 98.6 F (37 C), temperature source Oral, resp. rate 20, height 5\' 11"  (1.803 m), weight 134.129 kg (295 lb 11.2 oz), SpO2 95 %.  PHYSICAL EXAMINATION:  GENERAL:  65 y.o.-year-old patient lying in the bed with no acute distress. Obesity. EYES: Pupils equal, round, reactive to light and accommodation. No scleral icterus. Extraocular muscles intact.  HEENT: Head atraumatic, normocephalic. Oropharynx and nasopharynx clear.  NECK:  Supple, no jugular venous distention. No thyroid enlargement, no tenderness.  LUNGS: Normal breath sounds bilaterally, no wheezing, rales,rhonchi or crepitation. No use of accessory muscles of respiration.  CARDIOVASCULAR: S1, S2 normal. No murmurs, rubs, or gallops.  ABDOMEN: Soft, nontender, nondistended. Bowel sounds present. No organomegaly or mass. urostomy and colostomy appear normal. Wound VAC present in midline. No melena or bloody stool in bag. EXTREMITIES: No pedal  edema, cyanosis, or clubbing.  NEUROLOGIC: Cranial nerves II through XII are intact. Muscle strength 3/5 in all extremities. Sensation intact. Gait not checked.  PSYCHIATRIC: The patient is alert and oriented x 3.  SKIN: No obvious rash, lesion, or ulcer.    LABORATORY PANEL:   CBC  Recent Labs Lab 11/06/15 0624  WBC 4.1  HGB 7.5*  HCT 22.8*  PLT 167   ------------------------------------------------------------------------------------------------------------------  Chemistries   Recent Labs Lab 11/05/15 0729 11/06/15 0624  NA 137 136  K 3.6 4.2  CL 112* 114*  CO2 22 19*  GLUCOSE 160* 160*  BUN 33* 28*  CREATININE 1.84* 1.51*  CALCIUM 7.8* 7.7*  MG 2.0  --   AST 22  --   ALT 28  --   ALKPHOS 116  --   BILITOT 0.8  --    ------------------------------------------------------------------------------------------------------------------  Cardiac Enzymes  Recent Labs Lab 11/03/15 1609  TROPONINI 0.06*   ------------------------------------------------------------------------------------------------------------------  RADIOLOGY:  No results found.  EKG:   Orders placed or performed during the hospital encounter of 11/03/15  . EKG 12-Lead  . EKG 12-Lead  . ED EKG  . ED EKG    ASSESSMENT AND PLAN:   1). Sepsis with UTI discontinue Etarpenem (due to history of enterococcal bacteremia treated with these antibiotics), start Rocephin since urine cultures: PROTEUS MIRABILIS and blood cultures negative so far.  * Hypotension. Possible due to sepsis and hypovolemia. Improved. The patient was given normal saline bolus. Hold Lopressor. Discontinue normal saline IV.  2). Acute on chronic renal insufficiency - Patient has a creatinine of 2.03 from a baseline of 1.58 and a BUN of 58 from a baseline of 37. This is mostly likely due to inadequate fluid  intake.  Improved to baseline with normal saline IV.  3). Anemia of chronic disease - Patient was brought into  the hospital due to anemia with a hemoglobin of 6.  On admission, hemoglobin was 7.3 which is consistent with his baseline. Hb was 6.5 and he got 1 unit PRBC transfusion. Hemoglobin decreased to 6.1 this morning, possible due to IV fluid dilution. No active bleeding. The patient got another 1 unit PRBC transfusion. Hb 7.5 today. On iron replacement.  4). Hypokalemia Improved with potassium 40 mEq IV.  5). On total parenteral nutrition  - Continue TPN per dietitian.  6).Type 2 diabetes mellitus  Continue sliding scale insulin.    All the records are reviewed and case discussed with Care Management/Social Workerr. Management plans discussed with the patient, his mother and they are in agreement.  CODE STATUS: Partial code.  TOTAL TIME TAKING CARE OF THIS PATIENT: 35 minutes.  Greater than 50% time was spent on coordination of care and face-to-face counseling.  POSSIBLE D/C TO SNF IN 1-2 DAYS, DEPENDING ON CLINICAL CONDITION.   Demetrios Loll M.D on 11/06/2015 at 10:40 AM  Between 7am to 6pm - Pager - [phone]  After 6pm go to [email or web address] - password [email or web address] [facility]  Office  [phone]  CC: Primary care physician; Rachell Cipro, MD

## 2015-11-06 NOTE — Progress Notes (Signed)
PARENTERAL NUTRITION CONSULT NOTE -FOLLOW UP   Pharmacy Consult for Electrolyte Supplementation/Glucose Management Indication: TPN administration  No Known Allergies  Patient Measurements: Height: 5\' 11"  (180.3 cm) Weight: 295 lb 11.2 oz (134.129 kg) IBW/kg (Calculated) : 75.3   Vital Signs: Temp: 98.6 F (37 C) (04/02 0436) Temp Source: Oral (04/02 0436) BP: 114/66 mmHg (04/02 0436) Pulse Rate: 109 (04/02 0436) Intake/Output from previous day: 04/01 0701 - 04/02 0700 In: 2670.5 [I.V.:1083; Blood:300; IV Piggyback:100; TPN:1187.5] Out: 3300 [Urine:2150; Drains:1150] Intake/Output from this shift:    Labs:  Recent Labs  11/05/15 0535 11/05/15 1519 11/06/15 0624  WBC 3.8 4.1 4.1  HGB 6.1* 6.8* 7.5*  HCT 20.6* 20.8* 22.8*  PLT 200 203 167  APTT  --  34  --   INR  --  1.22  --      Recent Labs  11/03/15 1609 11/03/15 1629 11/04/15 0509 11/05/15 0729 11/06/15 0624  NA 134*  --  137 137 136  K 3.2*  --  4.4 3.6 4.2  CL 98*  --  106 112* 114*  CO2 28  --  26 22 19*  GLUCOSE 119*  --  140* 160* 160*  BUN 58*  --  50* 33* 28*  CREATININE 2.03*  --  2.16* 1.84* 1.51*  CALCIUM 8.3*  --  8.0* 7.8* 7.7*  MG  --  2.3  --  2.0  --   PHOS  --   --   --  3.2  --   PROT 7.2  --   --  5.6*  --   ALBUMIN 1.9*  --   --  1.5*  --   AST 53*  --   --  22  --   ALT 57  --   --  28  --   ALKPHOS 184*  --   --  116  --   BILITOT 0.9  --   --  0.8  --   TRIG  --   --   --  252*  --    Estimated Creatinine Clearance: 68.2 mL/min (by C-G formula based on Cr of 1.51).    Recent Labs  11/05/15 1750 11/05/15 2358 11/06/15 0617  GLUCAP 117* 151* 156*    Medical History: Past Medical History  Diagnosis Date  . Hypertension   . Pulmonary embolism ([place]) 12-03-11    01-17-10 s/p DVT left leg -Coumadin tx.until 08-28-11  . Hyperlipidemia   . Leg swelling 12-03-11    bilateral if up most of day,equally swells, up to knee level, improved now  . Fractures 12-03-11    s/p  right ankle and right wrist- no problems now  . Hypothyroidism   . Dyslipidemia   . Anemia 12-03-11    tx. oral iron supplement  . Arthritis 12-03-11    mild lower back  . 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 Chronic kidney disease 12-03-11    kidney stone x1 ,none recent stage III  . Secondary hyperparathyroidism, renal ([place])   . Chronic anticoagulation   . Shortness of breath 12-03-11    hx. 6'11- during pulmonary emboli episode, none now, extreme exertion only.  . Morbid obesity ([place])   . Vitamin D deficiency   . Chronic pulmonary edema   . Dorsalgia   . Acute MI, true posterior wall, initial episode of  care ([place]) 02/27/2015  . 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 Failure to thrive in adult   . Anxiety      Insulin Requirements in the past 24 hours:  Continue SSI q4 hours   Current Nutrition:  Clinimix-E at 100 mL/hr  Assessment: Pharmacy consulted to replace electrolytes and glucose management in a 65 yo male previously on TPN as outpatient.    Electrolytes WNL today  Plan:  No supplementation warranted at this point. SSI q4h ordered to assess insulin needs. Will electrolytes with am labs.   Pharmacy will continue to follow.   Myles,Sarim Rothman D 11/06/2015,8:01 AM

## 2015-11-06 NOTE — Progress Notes (Signed)
Patient rested quietly tonight and one complaint of cough managed with PRN robitussin. Changed VAC cannister at 0100. A&Ox4, VSS, and ST on tele. Nursing staff will continue to monitor. Earleen Reaper, RN

## 2015-11-06 NOTE — Consult Note (Signed)
WOC wound consult note Reason for Consult: NPWT system intact, staff converted from home V.A.C. Freedom unit to in house NPWT (K.C.I.) system. Freedom unit's cannister is discarded and Freedom unit and power cord is on the counter in the patient's room for return to facility for use upon discharge. Wound type: Not known. Midline.  Pressure Ulcer POA: No Measurement: Not assessed today Wound bed: Not assessed today Drainage (amount, consistency, odor) Not assessed today. Exudate in tubing and cannister is green/brown in color. Periwound: Not seen today Dressing procedure/placement/frequency: NPWT system is in tact and next dressing change is due on Monday, 11/07/15.  At that time minimally, urostomy pouching system should be changed as the NPWT drape overlaps the urostomy pouching system. Additionally and as noted below, the aperture for the pouching skin barrier is currently too large for the stoma.  WOC ostomy consult note Stoma type/location: LLQ Colostomy Approximately 1 and 1/4 inches round, red, raised (budded). Stomal assessment/size: Visualized through intact pouch (urinary pouch in place at this time, fecal (drainable) pouches are provided and bedside RN instructed to convert when next in room. Peristomal assessment: Not seen today Treatment options for stomal/peristomal skin: None today Output Clear mucous in pouch, no flatus. Ostomy pouching: 2pc.   Provided to bedside are two skin barriers and two pouches (both 2 and 1/4 inches) plus two skin barrier rings which are optional for this budded stoma and will depend on next peristomal skin assessment. Nursing staff provided (via orders) with the supply orders required for reordering these. Education provided: None (patient is not self care) Enrolled patient in [place] program: No   WOC ostomy consult note Stoma type/location: RLQ urostomy, approximately 7/8 inches round. Pink. Stomal assessment/size: Not visualized well  today; pouching system intact.  With next pouch change will cut aperture closer to stoma size and use skin barrier ring. Peristomal assessment: Not seen today Treatment options for stomal/peristomal skin: None today. Pouching system cut more closely to stoma size is indicated. Output Clear yellow urine Ostomy pouching: 2pc. Provided to bedside are two skin barriers and two pouches (both 2 and 1/4 inches) plus two skin barrier rings. Nursing staff provided (via orders) with the supply orders required for reordering these and additionally, the adapter needed for attachment to the bedside urinary drainage bag.  Education provided: None (Patient is not self care) Enrolled patient in [place] program: No  [facility] nursing team will follow, and will remain available to this patient, the nursing and medical teams.  Please re-consult if needed in between visits. Thanks, Maudie Flakes, MSN, RN, [place], Arther Abbott  Pager# [phone]

## 2015-11-07 LAB — GLUCOSE, CAPILLARY
GLUCOSE-CAPILLARY: 157 mg/dL — AB (ref 65–99)
GLUCOSE-CAPILLARY: 167 mg/dL — AB (ref 65–99)
GLUCOSE-CAPILLARY: 150 mg/dL — AB (ref 65–99)
GLUCOSE-CAPILLARY: 141 mg/dL — AB (ref 65–99)
Glucose-Capillary: 177 mg/dL — ABNORMAL HIGH (ref 65–99)

## 2015-11-07 LAB — CBC
HCT: 23.9 % — ABNORMAL LOW (ref 40.0–52.0)
Hemoglobin: 7.8 g/dL — ABNORMAL LOW (ref 13.0–18.0)
MCH: 27.3 pg (ref 26.0–34.0)
MCHC: 32.4 g/dL (ref 32.0–36.0)
MCV: 84 fL (ref 80.0–100.0)
Platelets: 180 10*3/uL (ref 150–440)
RBC: 2.85 MIL/uL — AB (ref 4.40–5.90)
RDW: 16.8 % — ABNORMAL HIGH (ref 11.5–14.5)
WBC: 4 10*3/uL (ref 3.8–10.6)

## 2015-11-07 LAB — URINE CULTURE

## 2015-11-07 LAB — BASIC METABOLIC PANEL
Anion gap: 3 — ABNORMAL LOW (ref 5–15)
BUN: 29 mg/dL — ABNORMAL HIGH (ref 6–20)
CALCIUM: 7.8 mg/dL — AB (ref 8.9–10.3)
CO2: 22 mmol/L (ref 22–32)
CREATININE: 1.42 mg/dL — AB (ref 0.61–1.24)
Chloride: 110 mmol/L (ref 101–111)
GFR, EST AFRICAN AMERICAN: 58 mL/min — AB (ref >60)
GFR, EST NON AFRICAN AMERICAN: 50 mL/min — AB (ref >60)
Glucose, Bld: 153 mg/dL — ABNORMAL HIGH (ref 65–99)
Potassium: 3.7 mmol/L (ref 3.5–5.1)
SODIUM: 135 mmol/L (ref 135–145)

## 2015-11-07 LAB — TYPE AND SCREEN
ABO/RH(D): A POS
ANTIBODY SCREEN: NEGATIVE
UNIT DIVISION: 0
Unit division: 0

## 2015-11-07 LAB — MAGNESIUM: MAGNESIUM: 1.7 mg/dL (ref 1.7–2.4)

## 2015-11-07 LAB — PREPARE RBC (CROSSMATCH)

## 2015-11-07 MED ORDER — MUPIROCIN 2 % EX OINT
1.0000 "application " | TOPICAL_OINTMENT | Freq: Two times a day (BID) | CUTANEOUS | Status: DC
  Administered 2015-11-07 – 2015-11-09 (×4): 1 via NASAL
  Filled 2015-11-07: qty 22

## 2015-11-07 MED ORDER — ACETAMINOPHEN 325 MG PO TABS: 325.0000 mg | ORAL_TABLET | Freq: Four times a day (QID) | ORAL | Status: DC | PRN

## 2015-11-07 MED ORDER — SODIUM CHLORIDE 0.9 % IV SOLN
Freq: Once | INTRAVENOUS | Status: AC
  Administered 2015-11-07: 17:00:00 via INTRAVENOUS

## 2015-11-07 MED ORDER — CEPHALEXIN 250 MG PO CAPS
500.0000 mg | ORAL_CAPSULE | Freq: Two times a day (BID) | ORAL | Status: DC
  Administered 2015-11-08 – 2015-11-09 (×3): 500 mg via ORAL
  Filled 2015-11-07: qty 1
  Filled 2015-11-07: qty 2
  Filled 2015-11-07 (×3): qty 1

## 2015-11-07 MED ORDER — TRACE MINERALS CR-CU-MN-SE-ZN 10-1000-500-60 MCG/ML IV SOLN
INTRAVENOUS | Status: AC
  Administered 2015-11-07: 18:00:00 via INTRAVENOUS
  Filled 2015-11-07: qty 2400

## 2015-11-07 MED ORDER — CHLORHEXIDINE GLUCONATE CLOTH 2 % EX PADS
6.0000 | MEDICATED_PAD | Freq: Every day | CUTANEOUS | Status: DC
  Administered 2015-11-07 – 2015-11-09 (×3): 6 via TOPICAL

## 2015-11-07 NOTE — Progress Notes (Addendum)
CSW touched base with [facility]- Admissions Coordinator with [facility]. She reports that she would need to know in advance of patient's discharge due to him having a TPN. She reports that patient's mother was at there facility reporting that patient is discharged and on his way to the facility. CSW informed Helene Kelp that patient has not been discharged and that we were awaiting MD Manuella Ghazi to complete his rounds. CSW informed Helene Kelp that she'll follow up with her once she discusses patient's status with MD Manuella Ghazi.    CSW informed Helene Kelp- admissions at [facility] that patient would possibly discharge tomorrow. Requested patient's TPN RX today so the facility can order it. CSW requested TPN RX from MD Manuella Ghazi. CSW faxed TPN order to [facility]. CSW will continue to follow and assist.   Ernest Pine, MSW, [place] Work Department [phone]

## 2015-11-07 NOTE — Care Management Important Message (Signed)
Important Message  Patient Details  Name: DARIOUS GREENHAW MRN: [id number] Date of Birth: Nov 11, 1950   Medicare Important Message Given:  Yes    Jolly Mango, RN 11/07/2015, 9:56 AM

## 2015-11-07 NOTE — Progress Notes (Signed)
PARENTERAL NUTRITION CONSULT NOTE -FOLLOW UP   Pharmacy Consult for Electrolyte Supplementation/Glucose Management Indication: TPN administration  No Known Allergies  Patient Measurements: Height: 5\' 11"  (180.3 cm) Weight: 299 lb 3.2 oz (135.716 kg) IBW/kg (Calculated) : 75.3   Vital Signs: Temp: 99.1 F (37.3 C) (04/03 0400) Temp Source: Oral (04/03 0400) BP: 125/66 mmHg (04/03 0400) Pulse Rate: 113 (04/03 0400) Intake/Output from previous day: 04/02 0701 - 04/03 0700 In: 1200 [IV Piggyback:50; TPN:1150] Out: 3300 [Urine:3050; Drains:250] Intake/Output from this shift: Total I/O In: -  Out: [id number] [Urine:1650]  Labs:  Recent Labs  11/05/15 0535 11/05/15 1519 11/06/15 0624  WBC 3.8 4.1 4.1  HGB 6.1* 6.8* 7.5*  HCT 20.6* 20.8* 22.8*  PLT 200 203 167  APTT  --  34  --   INR  --  1.22  --      Recent Labs  11/05/15 0729 11/06/15 0624 11/07/15 0515  NA 137 136 135  K 3.6 4.2 3.7  CL 112* 114* 110  CO2 22 19* 22  GLUCOSE 160* 160* 153*  BUN 33* 28* 29*  CREATININE 1.84* 1.51* 1.42*  CALCIUM 7.8* 7.7* 7.8*  MG 2.0  --  1.7  PHOS 3.2  --   --   PROT 5.6*  --   --   ALBUMIN 1.5*  --   --   AST 22  --   --   ALT 28  --   --   ALKPHOS 116  --   --   BILITOT 0.8  --   --   TRIG 252*  --   --    Estimated Creatinine Clearance: 73 mL/min (by C-G formula based on Cr of 1.42).    Recent Labs  11/06/15 1819 11/06/15 2338 11/07/15 0532  GLUCAP 132* 154* 157*    Medical History: Past Medical History  Diagnosis Date  . Hypertension   . Pulmonary embolism ([place]) 12-03-11    01-17-10 s/p DVT left leg -Coumadin tx.until 08-28-11  . Hyperlipidemia   . Leg swelling 12-03-11    bilateral if up most of day,equally swells, up to knee level, improved now  . Fractures 12-03-11    s/p right ankle and right wrist- no problems now  . Hypothyroidism   . Dyslipidemia   . Anemia 12-03-11    tx. oral iron supplement  . Arthritis 12-03-11    mild lower back  .  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 Colon cancer ([place]) 12/14/11    Low anterior resection of mid/proximal tumor=invasive adenocarcinoma,invaing through the muscularis propria into pericolonic fatty tissue  . Chronic kidney disease 12-03-11    kidney stone x1 ,none recent stage III  . Secondary hyperparathyroidism, renal ([place])   . Chronic anticoagulation   . Shortness of breath 12-03-11    hx. 6'11- during pulmonary emboli episode, none now, extreme exertion only.  . Morbid obesity ([place])   . Vitamin D deficiency   . Chronic pulmonary edema   . Dorsalgia   . Acute MI, true posterior wall, initial episode of care ([place]) 02/27/2015  . 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 Failure to thrive in adult   . Anxiety      Insulin Requirements in the past 24 hours:  Continue SSI q4 hours   Current Nutrition:  Clinimix-E at 100 mL/hr  Assessment: Pharmacy consulted to replace electrolytes and glucose management in a 65 yo male previously on TPN as outpatient.    Electrolytes WNL today  Plan:  No supplementation warranted at this point. SSI q4h ordered to assess insulin needs. Will electrolytes with am labs.   Pharmacy will continue to follow.   Laural Benes, Pharm.D., BCPS Clinical Pharmacist 11/07/2015,6:09 AM

## 2015-11-07 NOTE — Progress Notes (Signed)
Patient has rested quietly today. Wound vac and ostomy care done by Wound Nurse earlier today. Wife at bedside most of the afternoon. No complaints. Blood transfusing now.

## 2015-11-07 NOTE — Consult Note (Signed)
WOC wound consult note Reason for Consult:NPWT to nonhealing surgical wound.  Feculent effluent present in wound bed.  Wound type:Chronic nonhealing surgical wound Pressure Ulcer POA: N/A Measurement: 4 cm x 3.5 cm x 12 cm with exposed fascia.  Wound IB:[id number] Drainage (amount, consistency, odor) Heavy feculent effluent draining from wound.  Periwound:SOme denuded skin.  Will protect this area with a barrier ring  Dressing procedure/placement/frequency:Cleanse abdominal wound with NS and pat gently dry.  Apply black granufoam to wound bed.  (2 pieces used today).  Cover with drape.  Seal immediately achieved at 125 mmHg. Change Mon/Wed/Fri.  WOC ostomy consult note Stoma type/location: RLQ Urostomy, recessed.  In skin fold.  Will add barrier ring.  Stomal assessment/size: 1 " with barrier ring Peristomal assessment: Intact Treatment options for stomal/peristomal skin: Barrie ring Output Dark brown drainage in urinary bag, but NPWT drape overlapped and was leaking into urostomy. Bedside RN asked to change to new bedside bag.  Urine is noted to be clear when draining from urostomy.  Ostomy pouching: 2pc. 2 1/4" urostomy pouch with barrier ring.   Education provided: Informed patient that we would be changing Mon/Wed/Fri.  Verbalizes understanding.  Enrolled patient in [place] program: No  WOC ostomy consult note Stoma type/location: LUQ Colostomy Stomal assessment/size: 1" slightly budded, os at center Peristomal assessment: Intact Treatment options for stomal/peristomal skin: Will add barrier ring Output None in pouch at this time.   Ostomy pouching: 2pc. 2 1/4" pouch with barrier ring  Education provided:  Enrolled patient in [place] program: No WOC team will follow.   Domenic Moras RN BSN [place] Pager [phone]

## 2015-11-07 NOTE — Progress Notes (Signed)
[facility] at [place] NAME: Richard Warner    MR#:  [id number]  DATE OF BIRTH:  August 10, 1950  SUBJECTIVE:  CHIEF COMPLAINT:   Chief Complaint  Patient presents with  . Abnormal Lab   No complaint.  Blood pressure dropped to 97/58 this morning. REVIEW OF SYSTEMS:  CONSTITUTIONAL: No fever, +  weakness.  EYES: No blurred or double vision.  EARS, NOSE, AND THROAT: No tinnitus or ear pain.  RESPIRATORY: No cough, shortness of breath, wheezing or hemoptysis.  CARDIOVASCULAR: No chest pain, orthopnea, edema.  GASTROINTESTINAL: No nausea, vomiting, diarrhea or abdominal pain.  GENITOURINARY: No dysuria, hematuria.  ENDOCRINE: No polyuria, nocturia,  HEMATOLOGY: No anemia, easy bruising or bleeding SKIN: No rash or lesion. MUSCULOSKELETAL: no joint pain NEUROLOGIC: No tingling, numbness, weakness.  PSYCHIATRY: No depression or anxiety.  DRUG ALLERGIES:  No Known Allergies  VITALS:  Blood pressure 103/63, pulse 107, temperature 98.5 F (36.9 C), temperature source Oral, resp. rate 19, height 5\' 11"  (1.803 m), weight 135.716 kg (299 lb 3.2 oz), SpO2 95 %.  PHYSICAL EXAMINATION:  GENERAL:  65 y.o.-year-old patient lying in the bed with no acute distress. Obesity. EYES: Pupils equal, round, reactive to light and accommodation. No scleral icterus. Extraocular muscles intact.  HEENT: Head atraumatic, normocephalic. Oropharynx and nasopharynx clear.  NECK:  Supple, no jugular venous distention. No thyroid enlargement, no tenderness.  LUNGS: Normal breath sounds bilaterally, no wheezing, rales,rhonchi or crepitation. No use of accessory muscles of respiration.  CARDIOVASCULAR: S1, S2 normal. No murmurs, rubs, or gallops.  ABDOMEN: Soft, nontender, nondistended. Bowel sounds present. No organomegaly or mass. urostomy and colostomy appear normal. Wound VAC present in midline. No melena or bloody stool in bag. EXTREMITIES: No pedal  edema, cyanosis, or clubbing.  NEUROLOGIC: Cranial nerves II through XII are intact. Muscle strength 3/5 in all extremities. Sensation intact. Gait not checked.  PSYCHIATRIC: The patient is alert and oriented x 3.  SKIN: No obvious rash, lesion, or ulcer.  LABORATORY PANEL:   CBC  Recent Labs Lab 11/07/15 0515  WBC 4.0  HGB 7.8*  HCT 23.9*  PLT 180   ------------------------------------------------------------------------------------------------------------------  Chemistries   Recent Labs Lab 11/05/15 0729  11/07/15 0515  NA 137  < > 135  K 3.6  < > 3.7  CL 112*  < > 110  CO2 22  < > 22  GLUCOSE 160*  < > 153*  BUN 33*  < > 29*  CREATININE 1.84*  < > 1.42*  CALCIUM 7.8*  < > 7.8*  MG 2.0  --  1.7  AST 22  --   --   ALT 28  --   --   ALKPHOS 116  --   --   BILITOT 0.8  --   --   < > = values in this interval not displayed. ------------------------------------------------------------------------------------------------------------------  Cardiac Enzymes  Recent Labs Lab 11/03/15 1609  TROPONINI 0.06*    ASSESSMENT AND PLAN:   1). Sepsis with UTI discontinue Etarpenem (due to history of enterococcal bacteremia treated with these antibiotics), start Rocephin since urine cultures: PROTEUS MIRABILIS and blood cultures negative so far.  * Hypotension. Possible due to sepsis and hypovolemia.  Hold Lopressor and monitor  2). Acute on chronic renal insufficiency - mostly likely due to inadequate fluid intake. Improved to baseline with normal saline IV. - creat 1.42 today  3). Anemia of chronic disease - Patient was brought into the hospital due to anemia  with a hemoglobin of 6.  On admission, hemoglobin was 7.3 which is consistent with his baseline. Hb was 6.5 and he got 1 unit PRBC transfusion. Hemoglobin decreased to 6.1 this morning, possible due to IV fluid dilution. No active bleeding. The patient got another 1 unit PRBC transfusion. Hb 7.8 today. On iron  replacement. Will give 1 more unit as BP is also low, this will help his hemodynamics.  4). Hypokalemia repleted  5). On total parenteral nutrition  - Continue TPN per dietitian.  6).Type 2 diabetes mellitus  Continue sliding scale insulin.    All the records are reviewed and case discussed with Care Management/Social Workerr. Management plans discussed with the patient, his mother and they are in agreement.  CODE STATUS: Partial code.  TOTAL TIME TAKING CARE OF THIS PATIENT: 35 minutes.  Greater than 50% time was spent on coordination of care and face-to-face counseling.  POSSIBLE D/C TO SNF IN AM, DEPENDING ON CLINICAL CONDITION.   [facility], Blonnie Maske M.D on 11/07/2015 at 5:36 PM  Between 7am to 6pm - Pager - [phone]  After 6pm go to [email or web address] - password [email or web address] [place] [place] [facility]  Office  [phone]  CC: Primary care physician; Rachell Cipro, MD

## 2015-11-07 NOTE — Progress Notes (Signed)
RN noticed that wound vac draining into urostomy bag. Per charge nurse, wound care nurse to change site.  Wound nurse already consulted. Will make dayshift nurse aware. Will continue to monitor.   Richard Warner M

## 2015-11-08 LAB — TYPE AND SCREEN
ABO/RH(D): A POS
ANTIBODY SCREEN: NEGATIVE
Unit division: 0

## 2015-11-08 LAB — CBC
HCT: 27.7 % — ABNORMAL LOW (ref 40.0–52.0)
Hemoglobin: 9.1 g/dL — ABNORMAL LOW (ref 13.0–18.0)
MCH: 27.2 pg (ref 26.0–34.0)
MCHC: 32.9 g/dL (ref 32.0–36.0)
MCV: 82.8 fL (ref 80.0–100.0)
PLATELETS: 185 10*3/uL (ref 150–440)
RBC: 3.34 MIL/uL — AB (ref 4.40–5.90)
RDW: 17.6 % — AB (ref 11.5–14.5)
WBC: 4.6 10*3/uL (ref 3.8–10.6)

## 2015-11-08 LAB — BASIC METABOLIC PANEL
ANION GAP: 4 — AB (ref 5–15)
BUN: 30 mg/dL — ABNORMAL HIGH (ref 6–20)
CALCIUM: 8.3 mg/dL — AB (ref 8.9–10.3)
CO2: 20 mmol/L — AB (ref 22–32)
Chloride: 108 mmol/L (ref 101–111)
Creatinine, Ser: 1.53 mg/dL — ABNORMAL HIGH (ref 0.61–1.24)
GFR, EST AFRICAN AMERICAN: 53 mL/min — AB (ref >60)
GFR, EST NON AFRICAN AMERICAN: 46 mL/min — AB (ref >60)
Glucose, Bld: 153 mg/dL — ABNORMAL HIGH (ref 65–99)
POTASSIUM: 4.2 mmol/L (ref 3.5–5.1)
Sodium: 132 mmol/L — ABNORMAL LOW (ref 135–145)

## 2015-11-08 LAB — CULTURE, BLOOD (ROUTINE X 2)
CULTURE: NO GROWTH
CULTURE: NO GROWTH

## 2015-11-08 LAB — GLUCOSE, CAPILLARY
GLUCOSE-CAPILLARY: 134 mg/dL — AB (ref 65–99)
GLUCOSE-CAPILLARY: 142 mg/dL — AB (ref 65–99)
Glucose-Capillary: 160 mg/dL — ABNORMAL HIGH (ref 65–99)
Glucose-Capillary: 120 mg/dL — ABNORMAL HIGH (ref 65–99)

## 2015-11-08 LAB — PHOSPHORUS: PHOSPHORUS: 4.3 mg/dL (ref 2.5–4.6)

## 2015-11-08 LAB — MAGNESIUM: Magnesium: 1.7 mg/dL (ref 1.7–2.4)

## 2015-11-08 MED ORDER — CEPHALEXIN 500 MG PO CAPS: 500.0000 mg | ORAL_CAPSULE | Freq: Two times a day (BID) | ORAL | Status: AC

## 2015-11-08 MED ORDER — METOPROLOL TARTRATE 25 MG PO TABS
25.0000 mg | ORAL_TABLET | Freq: Two times a day (BID) | ORAL | Status: DC
  Administered 2015-11-08 – 2015-11-09 (×3): 25 mg via ORAL
  Filled 2015-11-08 (×3): qty 1

## 2015-11-08 MED ORDER — TRACE MINERALS CR-CU-MN-SE-ZN 10-1000-500-60 MCG/ML IV SOLN
INTRAVENOUS | Status: AC
  Administered 2015-11-08: 19:00:00 via INTRAVENOUS
  Filled 2015-11-08: qty 2400

## 2015-11-08 NOTE — Progress Notes (Signed)
[facility] at [place] NAME: Taj Benard    MR#:  [id number]  DATE OF BIRTH:  06-16-1951  SUBJECTIVE:  CHIEF COMPLAINT:   Chief Complaint  Patient presents with  . Abnormal Lab  doing much better, Hb improved after 1 PRBC REVIEW OF SYSTEMS:  CONSTITUTIONAL: No fever, +  weakness.  EYES: No blurred or double vision.  EARS, NOSE, AND THROAT: No tinnitus or ear pain.  RESPIRATORY: No cough, shortness of breath, wheezing or hemoptysis.  CARDIOVASCULAR: No chest pain, orthopnea, edema.  GASTROINTESTINAL: No nausea, vomiting, diarrhea or abdominal pain.  GENITOURINARY: No dysuria, hematuria.  ENDOCRINE: No polyuria, nocturia,  HEMATOLOGY: No anemia, easy bruising or bleeding SKIN: No rash or lesion. MUSCULOSKELETAL: no joint pain NEUROLOGIC: No tingling, numbness, weakness.  PSYCHIATRY: No depression or anxiety.  DRUG ALLERGIES:  No Known Allergies VITALS:  Blood pressure 105/63, pulse 91, temperature 97.4 F (36.3 C), temperature source Oral, resp. rate 18, height 5\' 11"  (1.803 m), weight 130.545 kg (287 lb 12.8 oz), SpO2 96 %. PHYSICAL EXAMINATION:  GENERAL:  65 y.o.-year-old patient lying in the bed with no acute distress. Obesity. EYES: Pupils equal, round, reactive to light and accommodation. No scleral icterus. Extraocular muscles intact.  HEENT: Head atraumatic, normocephalic. Oropharynx and nasopharynx clear.  NECK:  Supple, no jugular venous distention. No thyroid enlargement, no tenderness.  LUNGS: Normal breath sounds bilaterally, no wheezing, rales,rhonchi or crepitation. No use of accessory muscles of respiration.  CARDIOVASCULAR: S1, S2 normal. No murmurs, rubs, or gallops.  ABDOMEN: Soft, nontender, nondistended. Bowel sounds present. No organomegaly or mass. urostomy and colostomy appear normal. Wound VAC present in midline. No melena or bloody stool in bag. EXTREMITIES: No pedal edema, cyanosis, or  clubbing.  NEUROLOGIC: Cranial nerves II through XII are intact. Muscle strength 3/5 in all extremities. Sensation intact. Gait not checked.  PSYCHIATRIC: The patient is alert and oriented x 3.  SKIN: No obvious rash, lesion, or ulcer.  LABORATORY PANEL:   CBC  Recent Labs Lab 11/08/15 0617  WBC 4.6  HGB 9.1*  HCT 27.7*  PLT 185   ------------------------------------------------------------------------------------------------------------------  Chemistries   Recent Labs Lab 11/05/15 0729  11/08/15 0617  NA 137  < > 132*  K 3.6  < > 4.2  CL 112*  < > 108  CO2 22  < > 20*  GLUCOSE 160*  < > 153*  BUN 33*  < > 30*  CREATININE 1.84*  < > 1.53*  CALCIUM 7.8*  < > 8.3*  MG 2.0  < > 1.7  AST 22  --   --   ALT 28  --   --   ALKPHOS 116  --   --   BILITOT 0.8  --   --   < > = values in this interval not displayed. ------------------------------------------------------------------------------------------------------------------  Cardiac Enzymes  Recent Labs Lab 11/03/15 1609  TROPONINI 0.06*    ASSESSMENT AND PLAN:   1). Sepsis with UTI discontinue Etarpenem (due to history of enterococcal bacteremia treated with these antibiotics), start Rocephin since urine cultures: PROTEUS MIRABILIS and blood cultures negative so far.  * Hypotension. Possible due to sepsis and hypovolemia.  Hold Lopressor and monitor  2). Acute on chronic renal insufficiency - mostly likely due to inadequate fluid intake. Improved to baseline with normal saline IV. - creat 1.53 today  3). Anemia of chronic disease - Patient was brought into the hospital due to anemia with a hemoglobin of 6.  On admission, hemoglobin was 7.3 which is consistent with his baseline. Hb was 6.5 and he got 1 unit PRBC transfusion. Hemoglobin decreased to 6.1 this morning, possible due to IV fluid dilution. No active bleeding. The patient got another 1 unit PRBC transfusion. Hb 7.8 and given 1 more PRBC on 4/3 and Hb  improved to 9.1 this am. On iron replacement.   4). Hypokalemia repleted  5). On total parenteral nutrition  - Continue TPN per dietitian.  6).Type 2 diabetes mellitus  Continue sliding scale insulin.   Was planned for D/C and all paperwork completed, but then notified by CSW later in the afternoon that facility declined at last minute, and now they r requesting new auth and reeval by PT - I've shared with CSW that I'm not happy with this last minute decision change by facility as patient was ready for D/C otherwise.  All the records are reviewed and case discussed with Care Management/Social Worker. Management plans discussed with the patient, his mother and they are in agreement.  CODE STATUS: Partial code.  TOTAL TIME TAKING CARE OF THIS PATIENT: 35 minutes.  Greater than 50% time was spent on coordination of care and face-to-face counseling.  POSSIBLE D/C TO SNF IN AM, DEPENDING ON CLINICAL CONDITION. PT Cornell Barman, Angle Dirusso M.D on 11/08/2015 at 6:46 PM  Between 7am to 6pm - Pager - [phone]  After 6pm go to [email or web address] - password [email or web address] [place] [facility]  Office  [phone]  CC: Primary care physician; Rachell Cipro, MD

## 2015-11-08 NOTE — Progress Notes (Signed)
PARENTERAL NUTRITION CONSULT NOTE -FOLLOW UP   Pharmacy Consult for Electrolyte Supplementation/Glucose Management Indication: TPN administration  No Known Allergies  Patient Measurements: Height: 5\' 11"  (180.3 cm) Weight: 287 lb 12.8 oz (130.545 kg) IBW/kg (Calculated) : 75.3   Vital Signs: Temp: 98.3 F (36.8 C) (04/04 0550) Temp Source: Oral (04/04 0550) BP: 115/76 mmHg (04/04 0550) Pulse Rate: 112 (04/04 0550) Intake/Output from previous day: 04/03 0701 - 04/04 0700 In: 322 [Blood:322] Out: [id number] [Urine:2800; Drains:275]   Recent Labs  11/05/15 1519 11/06/15 0624 11/07/15 0515 11/08/15 0617  WBC 4.1 4.1 4.0 4.6  HGB 6.8* 7.5* 7.8* 9.1*  HCT 20.8* 22.8* 23.9* 27.7*  PLT 203 167 180 185  APTT 34  --   --   --   INR 1.22  --   --   --      Recent Labs  11/06/15 0624 11/07/15 0515 11/08/15 0617  NA 136 135 132*  K 4.2 3.7 4.2  CL 114* 110 108  CO2 19* 22 20*  GLUCOSE 160* 153* 153*  BUN 28* 29* 30*  CREATININE 1.51* 1.42* 1.53*  CALCIUM 7.7* 7.8* 8.3*  MG  --  1.7 1.7  PHOS  --   --  4.3   Estimated Creatinine Clearance: 66.3 mL/min (by C-G formula based on Cr of 1.53).    Recent Labs  11/07/15 1752 11/08/15 0005 11/08/15 0618  GLUCAP 141* 142* 160*    Medical History: Past Medical History  Diagnosis Date  . Hypertension   . Pulmonary embolism ([place]) 12-03-11    01-17-10 s/p DVT left leg -Coumadin tx.until 08-28-11  . Hyperlipidemia   . Leg swelling 12-03-11    bilateral if up most of day,equally swells, up to knee level, improved now  . Fractures 12-03-11    s/p right ankle and right wrist- no problems now  . Hypothyroidism   . Dyslipidemia   . Anemia 12-03-11    tx. oral iron supplement  . Arthritis 12-03-11    mild lower back  . 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 Chronic kidney disease 12-03-11    kidney stone x1 ,none recent stage III  . Secondary hyperparathyroidism, renal ([place])   . Chronic anticoagulation   . Shortness of breath 12-03-11    hx. 6'11- during pulmonary emboli episode, none now, extreme exertion only.  . Morbid obesity ([place])   . Vitamin D deficiency   . Chronic pulmonary edema   . Dorsalgia   . Acute MI, true posterior wall, initial episode of care ([place]) 02/27/2015  . 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 Failure to thrive in adult   . Anxiety      Insulin Requirements in the past 24 hours:  Continue SSI q4 hours   Current Nutrition:  Clinimix-E at 100 mL/hr  Assessment: Pharmacy consulted to replace electrolytes and glucose management in a 65 yo male previously on TPN as outpatient.    Electrolytes WNL today  Plan:  No supplementation warranted today. SSI q4h ordered to assess insulin needs.   Will continue to monitor electrolytes with am labs.   Pharmacy will continue to follow per consult  Nancy Fetter, PharmD Pharmacy Resident 11/08/2015,10:38 AM

## 2015-11-08 NOTE — Discharge Instructions (Signed)
Sepsis, Adult Sepsis is a serious infection of your blood or tissues that affects your whole body. The infection that causes sepsis may be bacterial, viral, fungal, or parasitic. Sepsis may be life threatening. Sepsis can cause your blood pressure to drop. This may result in shock. Shock causes your central nervous system and your organs to stop working correctly.  RISK FACTORS Sepsis can happen in anyone, but it is more likely to happen in people who have weakened immune systems. SIGNS AND SYMPTOMS  Symptoms of sepsis can include:  Fever or low body temperature (hypothermia).  Rapid breathing (hyperventilation).  Chills.  Rapid heartbeat (tachycardia).  Confusion or light-headedness.  Trouble breathing.  Urinating much less than usual.  Cool, clammy skin or red, flushed skin.  Other problems with the heart, kidneys, or brain. DIAGNOSIS  Your health care provider will likely do tests to look for an infection, to see if the infection has spread to your blood, and to see how serious your condition is. Tests can include:  Blood tests, including cultures of your blood.  Cultures of other fluids from your body, such as:  Urine.  Pus from wounds.  Mucus coughed up from your lungs.  Urine tests other than cultures.  X-ray exams or other imaging tests. TREATMENT  Treatment will begin with elimination of the source of infection. If your sepsis is likely caused by a bacterial or fungal infection, you will be given antibiotic or antifungal medicines. You may also receive:  Oxygen.  Fluids through an IV tube.  Medicines to increase your blood pressure.  A machine to clean your blood (dialysis) if your kidneys fail.  A machine to help you breathe if your lungs fail. SEEK IMMEDIATE MEDICAL CARE IF: You get an infection or develop any of the signs and symptoms of sepsis after surgery or a hospitalization.   This information is not intended to replace advice given to you by  your health care provider. Make sure you discuss any questions you have with your health care provider.   Document Released: 04/21/2003 Document Revised: 12/07/2014 Document Reviewed: 03/30/2013 Elsevier Interactive Patient Education 2016 [employer]  

## 2015-11-08 NOTE — Discharge Summary (Addendum)
[facility] NAME: Richard Warner    MR#:  [id number]  DATE OF BIRTH:  07-Sep-1950  DATE OF ADMISSION:  11/03/2015 ADMITTING PHYSICIAN: Saundra Shelling, MD  DATE OF DISCHARGE: 11/09/2015  PRIMARY CARE PHYSICIAN: DEWEY,ELIZABETH, MD    ADMISSION DIAGNOSIS:  Tachycardia, unspecified [R00.0] UTI (lower urinary tract infection) [N39.0]  DISCHARGE DIAGNOSIS:  Principal Problem:   Sepsis ([place]) Active Problems:   Anemia of chronic disease   Acute on chronic renal insufficiency (HCC)   Hypokalemia   Type 2 diabetes mellitus ([place])   On total parenteral nutrition (TPN)  SECONDARY DIAGNOSIS:   Past Medical History  Diagnosis Date  . Hypertension   . Pulmonary embolism ([place]) 12-03-11    01-17-10 s/p DVT left leg -Coumadin tx.until 08-28-11  . Hyperlipidemia   . Leg swelling 12-03-11    bilateral if up most of day,equally swells, up to knee level, improved now  . Fractures 12-03-11    s/p right ankle and right wrist- no problems now  . Hypothyroidism   . Dyslipidemia   . Anemia 12-03-11    tx. oral iron supplement  . Arthritis 12-03-11    mild lower back  . 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 Chronic kidney disease 12-03-11    kidney stone x1 ,none recent stage III  . Secondary hyperparathyroidism, renal ([place])   . Chronic anticoagulation   . Shortness of breath 12-03-11    hx. 6'11- during pulmonary emboli episode, none now, extreme exertion only.  . Morbid obesity ([place])   . Vitamin D deficiency   . Chronic pulmonary edema   . Dorsalgia   . Acute MI, true posterior wall, initial episode of care ([place]) 02/27/2015  . 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 Failure to thrive in adult   . Anxiety     HOSPITAL COURSE:  65 y.o. male with  a known history of Hypothyroidism, anemia, hyperlipidemia, colon cancer, chronic kidney disease, CAD, FTT and a history of rectal cancer. He has a recent complicated medical/surgical history and had surgery 3 in November 2016 that resulted in a urostomy, colostomy and an abdominal wound VAC. He is currently on total parenteral nutrition and resides in a skilled nursing facility. Was brought to the ED today from the SNF due to a hemoglobin of 6. On arrival in the ED, He was also found to have sepsis with suspected intra-abdominal/urinary source, hypokalemia and acute on chronic renal insufficiency.  Please see Dr. Governor Rooks dictated history and physical for further details.  1). Sepsis with UTI Treated and resolved  2). Acute on chronic renal insufficiency: Prerenal - mostly likely due to inadequate fluid intake. Improved to baseline with normal saline IV.  3). Anemia of chronic disease - Required 3 units of packed red blood cell transfusion, but no active bleeding noted. - Hemoglobin of 9.1 on the date of discharge  4). Hypokalemia repleted  5). On total parenteral nutrition  - Continue TPN per dietitian.  6).Type 2 diabetes mellitus Continue sliding scale insulin.  He has remained hemodynamically stable and is being discharged back to his facility in fair condition.  He does remain at very high risk for readmission considering his multiple comorbidities. DISCHARGE CONDITIONS:   stable  CONSULTS OBTAINED:     DRUG ALLERGIES:  No Known Allergies  DISCHARGE MEDICATIONS:   Current Discharge Medication List    START taking these medications   Details  cephALEXin (KEFLEX) 500 MG capsule Take 1 capsule (500 mg total) by mouth every 12 (twelve) hours. Qty: 2 capsule, Refills: 0      CONTINUE these medications which have NOT CHANGED   Details  aspirin EC 81 MG tablet Take 1 tablet (81 mg total) by mouth daily. Qty: 90 tablet, Refills: 3    Cholecalciferol (VITAMIN D3) 5000  UNITS TABS Take 5,000 Units by mouth daily after supper.     famotidine (PEPCID) 20 MG tablet Take 20 mg by mouth daily.    ferrous sulfate 325 (65 FE) MG tablet Take 325 mg by mouth daily with breakfast.    heparin 5000 UNIT/ML injection Inject 7,500 Units into the skin every 6 (six) hours as needed (prophylaxis).    insulin regular (NOVOLIN R,HUMULIN R) 100 units/mL injection Inject 0.02 mLs (2 Units total) into the skin every 6 (six) hours as needed for high blood sugar (CBG is greater than 150). Qty: 10 mL, Refills: 11    lamoTRIgine (LAMICTAL) 25 MG tablet Take 50 mg by mouth daily.    levothyroxine (SYNTHROID, LEVOTHROID) 88 MCG tablet Take 88 mcg by mouth daily before breakfast.    Melatonin 3 MG TABS Take 6 mg by mouth at bedtime.    metoprolol tartrate (LOPRESSOR) 25 MG tablet Take 1 tablet (25 mg total) by mouth 2 (two) times daily. Qty: 60 tablet, Refills: 8   Associated Diagnoses: Hyperlipidemia; Coronary artery disease involving native coronary artery of native heart without angina pectoris    mirtazapine (REMERON) 15 MG tablet Take 15 mg by mouth at bedtime.    nitroGLYCERIN (NITROSTAT) 0.4 MG SL tablet Place 0.4 mg under the tongue every 5 (five) minutes as needed for chest pain (3 doses MAX).    tetrahydrozoline 0.05 % ophthalmic solution Place 2 drops into both eyes every 8 (eight) hours as needed (dry eyes).    oxyCODONE (OXY IR/ROXICODONE) 5 MG immediate release tablet Take 5 mg by mouth every 4 (four) hours as needed for severe pain.       TPN (CLINIMIX-E) Adult Intravenous, Continuous, at 100 mL/hr   DISCHARGE INSTRUCTIONS:  Wound care per wound care Consultation as below  [place]  Reason for Consult:NPWT to nonhealing surgical wound. Feculent effluent present in wound bed.  Wound type:Chronic nonhealing surgical wound Pressure Ulcer POA: N/A Measurement: 4 cm x 3.5 cm x 12 cm with exposed fascia.  Wound [id number] Drainage (amount, consistency, odor)  Heavy feculent effluent draining from wound.  Periwound:SOme denuded skin. Will protect this area with a barrier ring  Dressing procedure/placement/frequency:Cleanse abdominal wound with NS and pat gently dry. Apply black granufoam to wound bed. (2 pieces used today). Cover with drape. Seal immediately achieved at 125 mmHg. Change Mon/Wed/Fri.  WOC ostomy consult note Stoma type/location: RLQ Urostomy, recessed. In skin fold. Will add barrier ring.  Stomal assessment/size: 1 " with barrier ring Peristomal assessment: Intact Treatment options for stomal/peristomal skin: Barrie ring Output Dark brown drainage in urinary bag, but NPWT drape overlapped and was leaking into urostomy. Bedside RN asked to change to new bedside bag. Urine is noted to be clear when draining from urostomy.  Ostomy pouching: 2pc. 2 1/4" urostomy pouch with barrier ring.  Education provided: Informed patient that we would be changing Mon/Wed/Fri. Verbalizes understanding.  Enrolled patient in [place] program: No  WOC ostomy consult note Stoma type/location: LUQ  Colostomy Stomal assessment/size: 1" slightly budded, os at center Peristomal assessment: Intact Treatment options for stomal/peristomal skin: Will add barrier ring Output None in pouch at this time.  Ostomy pouching: 2pc. 2 1/4" pouch with barrier ring  Education provided:  Enrolled patient in [place] program: No WOC team will follow.   Domenic Moras RN BSN CWON Pager [phone]  DIET:  Ferrel Logan with meds  DISCHARGE CONDITION:  Fair  ACTIVITY:  Activity as tolerated  OXYGEN:  Home Oxygen: No.   Oxygen Delivery: room air  DISCHARGE LOCATION:  nursing home   If you experience worsening of your admission symptoms, develop shortness of breath, life threatening emergency, suicidal or homicidal thoughts you must seek medical attention immediately by calling 911 or calling your MD immediately  if  symptoms less severe.  You Must read complete instructions/literature along with all the possible adverse reactions/side effects for all the Medicines you take and that have been prescribed to you. Take any new Medicines after you have completely understood and accpet all the possible adverse reactions/side effects.   Please note  You were cared for by a hospitalist during your hospital stay. If you have any questions about your discharge medications or the care you received while you were in the hospital after you are discharged, you can call the unit and asked to speak with the hospitalist on call if the hospitalist that took care of you is not available. Once you are discharged, your primary care physician will handle any further medical issues. Please note that NO REFILLS for any discharge medications will be authorized once you are discharged, as it is imperative that you return to your primary care physician (or establish a relationship with a primary care physician if you do not have one) for your aftercare needs so that they can reassess your need for medications and monitor your lab values.    On the day of Discharge:  VITAL SIGNS:  Blood pressure 105/63, pulse 93, temperature 97.9 F (36.6 C), temperature source Oral, resp. rate 18, height 5\' 11"  (1.803 m), weight 128.595 kg (283 lb 8 oz), SpO2 99 %.  PHYSICAL EXAMINATION:  GENERAL:  Obese 65 y.o.-year-old patient lying in the bed, chronically ill-looking. Alert and oriented x 3. EYES: Pupils equal, round, reactive to light and accommodation. No scleral icterus. Extraocular muscles intact.  HEENT: Head atraumatic, normocephalic. Oropharynx and nasopharynx clear.  NECK: Supple, no jugular venous distention. No thyroid enlargement, no tenderness.  LUNGS: Normal breath sounds bilaterally, no wheezing, rales,rhonchi or crepitation. No use of accessory muscles of respiration.  CARDIOVASCULAR: S1, S2 normal. No murmurs, rubs, or  gallops.  ABDOMEN: obese, urostomy and colostomy appear normal. Wound VAC present in midline, soft, nontender, nondistended. Bowel sounds present. No organomegaly or mass.  EXTREMITIES: No pedal edema, cyanosis, or clubbing.  NEUROLOGIC: Cranial nerves II through XII are intact. Muscle strength 5/5 in all extremities. Sensation intact. Gait not checked.  SKIN: No obvious rash, lesion, or ulcer.  DATA REVIEW:   CBC  Recent Labs Lab 11/08/15 0617  WBC 4.6  HGB 9.1*  HCT 27.7*  PLT 185    Chemistries   Recent Labs Lab 11/05/15 0729  11/08/15 0617 11/09/15 0719  NA 137  < > 132* 132*  K 3.6  < > 4.2 4.6  CL 112*  < > 108 108  CO2 22  < > 20* 21*  GLUCOSE 160*  < > 153* 151*  BUN 33*  < > 30* 30*  CREATININE 1.84*  < > 1.53* 1.61*  CALCIUM 7.8*  < > 8.3* 8.3*  MG 2.0  < > 1.7  --   AST 22  --   --   --   ALT 28  --   --   --   ALKPHOS 116  --   --   --   BILITOT 0.8  --   --   --   < > = values in this interval not displayed.  Cardiac Enzymes  Recent Labs Lab 11/03/15 1609  TROPONINI 0.06*    Microbiology Results  Results for orders placed or performed during the hospital encounter of 11/03/15  Blood culture (routine x 2)     Status: None   Collection Time: 11/03/15  4:09 PM  Result Value Ref Range Status   Specimen Description BLOOD LEFT ARM  Final   Special Requests   Final    BOTTLES DRAWN AEROBIC AND ANAEROBIC 15AEROBIC,5ML ANAEROBIC   Culture NO GROWTH 5 DAYS  Final   Report Status 11/08/2015 FINAL  Final  Blood culture (routine x 2)     Status: None   Collection Time: 11/03/15  4:10 PM  Result Value Ref Range Status   Specimen Description BLOOD LEFT FATTY CASTS  Final   Special Requests   Final    BOTTLES DRAWN AEROBIC AND ANAEROBIC  AERO 3CC ANA [place]   Culture NO GROWTH 5 DAYS  Final   Report Status 11/08/2015 FINAL  Final  Urine culture     Status: None   Collection Time: 11/03/15  8:15 PM  Result Value Ref Range Status   Specimen  Description URINE, CATHETERIZED  Final   Special Requests NONE  Final   Culture   Final    >=100,000 COLONIES/mL PROTEUS MIRABILIS WITH MULTIPLE SPECIES PRESENT, SUGGEST RECOLLECTION    Report Status 11/07/2015 FINAL  Final   Organism ID, Bacteria PROTEUS MIRABILIS  Final      Susceptibility   Proteus mirabilis - MIC*    AMPICILLIN >=32 RESISTANT Resistant     CEFAZOLIN 8 SENSITIVE Sensitive     CEFTRIAXONE <=1 SENSITIVE Sensitive     CIPROFLOXACIN >=4 RESISTANT Resistant     GENTAMICIN <=1 SENSITIVE Sensitive     IMIPENEM 2 SENSITIVE Sensitive     NITROFURANTOIN 128 RESISTANT Resistant     TRIMETH/SULFA >=320 RESISTANT Resistant     AMPICILLIN/SULBACTAM >=32 RESISTANT Resistant     PIP/TAZO <=4 SENSITIVE Sensitive     * >=100,000 COLONIES/mL PROTEUS MIRABILIS  MRSA PCR Screening     Status: Abnormal   Collection Time: 11/04/15  3:20 AM  Result Value Ref Range Status   MRSA by PCR POSITIVE (A) NEGATIVE Final    Comment:        The GeneXpert MRSA Assay (FDA approved for NASAL specimens only), is one component of a comprehensive MRSA colonization surveillance program. It is not intended to diagnose MRSA infection nor to guide or monitor treatment for MRSA infections. CRITICAL RESULT CALLED TO, READ BACK BY AND VERIFIED WITH: [place] AT [id number] 11/04/15 KLK     RADIOLOGY:  No results found.   Management plans discussed with the patient, family and they are in agreement.  CODE STATUS:     Code Status Orders        Start     Ordered   11/03/15 2257  Limited resuscitation (code)   Continuous    Question Answer Comment  In the event of cardiac or respiratory ARREST:  Initiate Code Blue, Call Rapid Response Yes   In the event of cardiac or respiratory ARREST: Perform CPR Yes   In the event of cardiac or respiratory ARREST: Perform Intubation/Mechanical Ventilation No   In the event of cardiac or respiratory ARREST: Use NIPPV/BiPAp only if indicated Yes   In the  event of cardiac or respiratory ARREST: Administer ACLS medications if indicated Yes   In the event of cardiac or respiratory ARREST: Perform Defibrillation or Cardioversion if indicated Yes      11/03/15 2257     Please note patient remains at very high risk for readmissions.  TOTAL TIME TAKING CARE OF THIS PATIENT: 45 minutes.    [facility], Reiss Mowrey M.D on 11/09/2015 at 4:58 PM  Between 7am to 6pm - Pager - [phone]  After 6pm go to [email or web address] - password [email or web address]  Office  [phone]  CC: Primary care physician; Rachell Cipro, MD   Note: This dictation was prepared with Dragon dictation along with smaller phrase technology. Any transcriptional errors that result from this process are unintentional.

## 2015-11-08 NOTE — Progress Notes (Signed)
CSW was informed that patient is medically stable for discharge. CSW called Helene Kelp- in admissions at [facility] and was informed that patient's insurance needs a reauth [facility]). CSW sent clinicals to [facility] to obtain an authorization. [employer] requested PT note. CSW requested PT evaluation be completed. PT consult was requested. PT reports they'll be able to evaluate patient tomorrow 11/08/15. CSW informed MD of above. Patient looking to discharge tomorrow 11/08/15 after PT evaluation has been completed and faxed to [facility]. CSW will continue to follow and assist.   Ernest Pine, MSW, [place] Work Department [phone]

## 2015-11-09 LAB — GLUCOSE, CAPILLARY
GLUCOSE-CAPILLARY: 178 mg/dL — AB (ref 65–99)
Glucose-Capillary: 145 mg/dL — ABNORMAL HIGH (ref 65–99)
Glucose-Capillary: 155 mg/dL — ABNORMAL HIGH (ref 65–99)
Glucose-Capillary: 167 mg/dL — ABNORMAL HIGH (ref 65–99)
Glucose-Capillary: 196 mg/dL — ABNORMAL HIGH (ref 65–99)

## 2015-11-09 LAB — BASIC METABOLIC PANEL
Anion gap: 3 — ABNORMAL LOW (ref 5–15)
BUN: 30 mg/dL — AB (ref 6–20)
CALCIUM: 8.3 mg/dL — AB (ref 8.9–10.3)
CHLORIDE: 108 mmol/L (ref 101–111)
CO2: 21 mmol/L — AB (ref 22–32)
CREATININE: 1.61 mg/dL — AB (ref 0.61–1.24)
GFR calc Af Amer: 50 mL/min — ABNORMAL LOW (ref >60)
GFR calc non Af Amer: 43 mL/min — ABNORMAL LOW (ref >60)
Glucose, Bld: 151 mg/dL — ABNORMAL HIGH (ref 65–99)
Potassium: 4.6 mmol/L (ref 3.5–5.1)
Sodium: 132 mmol/L — ABNORMAL LOW (ref 135–145)

## 2015-11-09 MED ORDER — M.V.I. ADULT IV INJ
INJECTION | INTRAVENOUS | Status: DC
  Filled 2015-11-09: qty 2400

## 2015-11-09 MED ORDER — ENOXAPARIN SODIUM 40 MG/0.4ML ~~LOC~~ SOLN: 40.0000 mg | SUBCUTANEOUS | Status: DC

## 2015-11-09 NOTE — Clinical Social Work Note (Signed)
Pt is ready for discharge today. MD completed peer to peer and [facility] has given auth [phone]). CSW updated facility and MD. MD wrote order. Facility is ready to accept pt as they have all the discharge information. RN will call report and EMS will provide transportation to facility. Pt and mother are aware and agreeable to discharge plan. CSW is signing off as no further needs identified.   Darden Dates, MSW, LCSW Clinical Social Worker  [phone]

## 2015-11-09 NOTE — Progress Notes (Signed)
CSW spoke to Richard Warner. Requested PT note and TPN information. CSW faxed information to [facility]. Awaiting auth. CSW will continue to follow and assist.   Ernest Pine, MSW, [place] Work Department [phone]

## 2015-11-09 NOTE — Care Management Important Message (Signed)
Important Message  Patient Details  Name: Richard Warner MRN: [id number] Date of Birth: 07/23/1951   Medicare Important Message Given:  Yes    Shelbie Ammons, RN 11/09/2015, 1:50 PM

## 2015-11-09 NOTE — Progress Notes (Signed)
Nutrition Follow-up  DOCUMENTATION CODES:   Morbid obesity  INTERVENTION:   PN: Recommend continuing current TPN regimen as ordered. Will recommend addition of 20% Lipids tomorrow, if pt not discharged. Pharmacy following for electrolyte and glucose management.   NUTRITION DIAGNOSIS:   Inadequate oral intake related to altered GI function, chronic illness as evidenced by NPO status.  GOAL:   Patient will meet greater than or equal to 90% of their needs  MONITOR:   Labs, Weight trends, I & O's, Skin (TPN tolerance)  REASON FOR ASSESSMENT:   Consult New TPN/TNA  ASSESSMENT:    Pt discharge pending per MD note.  Diet Order:  Diet NPO time specified Except for: Sips with Meds Diet - low sodium heart healthy .TPN (CLINIMIX-E) Adult .TPN (CLINIMIX-E) Adult    Current Nutrition: 5%AA/20%Dextrose PN at 170mL/hr   Gastrointestinal Profile: Last BM: no stool documented via ostomy, noted pt with 270mL output documented via wound vac   Medications: reviewed  Labs : reviewed, K, P, Mg WDL, Na 132   Weight Trend since Admission: Filed Weights   11/07/15 0400 11/08/15 0500 11/09/15 0641  Weight: 299 lb 3.2 oz (135.716 kg) 287 lb 12.8 oz (130.545 kg) 283 lb 8 oz (128.595 kg)   RD notes decrease in weight  Skin:  Reviewed, no issues  Height:   Ht Readings from Last 1 Encounters:  11/03/15 5\' 11"  (1.803 m)    Weight:   Wt Readings from Last 1 Encounters:  11/09/15 283 lb 8 oz (128.595 kg)     BMI:  Body mass index is 39.56 kg/(m^2).  Estimated Nutritional Needs:   Kcal:  2200-2400 kcals   Protein:  160-180 g  Fluid:  >2.2 L  EDUCATION NEEDS:   No education needs identified at this time  Dwyane Luo, RD, LDN Pager [phone] Weekend/On-Call Pager [phone]

## 2015-11-09 NOTE — Evaluation (Signed)
Physical Therapy Evaluation Patient Details Name: Richard Warner MRN: [id number] DOB: 03/26/1951 Today's Date: 11/09/2015   History of Present Illness  Pt is a 65 y.o. M admitted to hospital with sepsis, UTI, anemia, acute on chronic renal insufficiency, and abnormal Hgb levels. Pt has extensive hx of hypothyroid, anemia, hyperlipidemia, colon cancer, CKD, and CAD. Pt admitted to hospital previously in Nov. 2016 for colostomy, urostomy and PICC for TPN in R upper chest. Pt at [facility] health care prior to admission.   Clinical Impression  Pt is a 65 y.o. M admitted to hospital for sepsis, UTI, anemia, acute on chronic renal insufficiency and abnormal Hgb levels. Pt has extensive hx of hypothyroid, anemia, hyperlipidimia, colon cancer, CKD, and CAD. Pt recently admitted in Nov. 2016 for colostomy, urostomy, and PICC placement. Prior to this admission, pt required assist w/ all ADLs. Pt has resided at [facility] since October 17, 2015. Pt stated he was unable to ambulate prior to admission. Pt able to sit up with assist approx. 1 week ago. Pt alert and oriented during treatment. Pt agitated by having to repeatedly answer same questions/do same exercises. Pt demonstrates good B UE strength, and poor B LE strength. Pt able to perform rolling bed mobility with 2+ max assist and use of railings. Pt unable to perform transfer or ambulation. Pt stated he is motivated to continue PT he was receiving at SNF. Pt demonstrates deficits in strength, ROM and mobility. Pt would benefit from further skilled PT; recommend pt sent to SNF after discharge from acute hospitalization.     Follow Up Recommendations SNF    Equipment Recommendations       Recommendations for Other Services       Precautions / Restrictions Precautions Precautions: Fall Restrictions Weight Bearing Restrictions: No      Mobility  Bed Mobility Overal bed mobility: Needs Assistance;+2 for physical assistance Bed Mobility:  Rolling Rolling: +2 for physical assistance;Max assist         General bed mobility comments: Pt able to roll to R side with use of railings and 2+ max assist for trunk and B LE.   Transfers                 General transfer comment: Unable to perform transfer due to pts strength/mobility and safety.   Ambulation/Gait             General Gait Details: Unable to perform gait for safety due to pts strength and PLOF.  Stairs            Wheelchair Mobility    Modified Rankin (Stroke Patients Only)       Balance Overall balance assessment: Needs assistance     Sitting balance - Comments: Unable to perform balance assessment due to pts strength and mobility.                                      Pertinent Vitals/Pain Pain Assessment: Faces Faces Pain Scale: Hurts even more    Home Living Family/patient expects to be discharged to:: Skilled nursing facility                 Additional Comments: Pt lived at [facility] health care prior to admission. Family hoping for ALF placement after SNF.    Prior Function Level of Independence: Needs assistance   Gait / Transfers Assistance Needed: Pt unable to ambulate.  ADL's / Homemaking Assistance Needed: Needs assist w/all ADLs   Comments: Pt stated he was able to sit up 1 week ago. Pt has not ambulated for extended period of time. Needs assist w/all ADLs since previous admission.      Hand Dominance        Extremity/Trunk Assessment   Upper Extremity Assessment: RUE deficits/detail;LUE deficits/detail RUE Deficits / Details: R UE grossly 4/5      LUE Deficits / Details: L UE grossly 4/5    Lower Extremity Assessment: RLE deficits/detail;LLE deficits/detail;Generalized weakness RLE Deficits / Details: R LE grossly 3/5 LLE Deficits / Details: L LE grossly 2/5, decreased knee flex ROM     Communication   Communication: No difficulties  Cognition Arousal/Alertness:  Awake/alert Behavior During Therapy: WFL for tasks assessed/performed;Agitated Overall Cognitive Status: Within Functional Limits for tasks assessed                      General Comments      Exercises        Assessment/Plan    PT Assessment Patient needs continued PT services  PT Diagnosis Difficulty walking;Abnormality of gait;Generalized weakness   PT Problem List Decreased strength;Decreased range of motion;Decreased activity tolerance;Decreased mobility;Pain  PT Treatment Interventions Therapeutic exercise;Functional mobility training   PT Goals (Current goals can be found in the Care Plan section) Acute Rehab PT Goals Patient Stated Goal: to leave the hospital PT Goal Formulation: With patient Time For Goal Achievement: 11/23/15 Potential to Achieve Goals: Good    Frequency Min 2X/week   Barriers to discharge        Co-evaluation               End of Session   Activity Tolerance: Treatment limited secondary to agitation;Patient limited by pain Patient left: in bed;with bed alarm set Nurse Communication: Mobility status         Time: [id number] PT Time Calculation (min) (ACUTE ONLY): 15 min   Charges:         PT G Codes:        Sherral Hammers 12/03/15, 9:44 AM M. Barnett Abu, SPT

## 2015-11-09 NOTE — Progress Notes (Signed)
Richard Warner at [place] NAME: Richard Warner    MR#:  [id number]  DATE OF BIRTH:  [birth year]  SUBJECTIVE:  CHIEF COMPLAINT:   Chief Complaint  Patient presents with  . Abnormal Lab  no new issues, still waiting for insurance auth  REVIEW OF SYSTEMS:  CONSTITUTIONAL: No fever, +  weakness.  EYES: No blurred or double vision.  EARS, NOSE, AND THROAT: No tinnitus or ear pain.  RESPIRATORY: No cough, shortness of breath, wheezing or hemoptysis.  CARDIOVASCULAR: No chest pain, orthopnea, edema.  GASTROINTESTINAL: No nausea, vomiting, diarrhea or abdominal pain.  GENITOURINARY: No dysuria, hematuria.  ENDOCRINE: No polyuria, nocturia,  HEMATOLOGY: No anemia, easy bruising or bleeding SKIN: No rash or lesion. MUSCULOSKELETAL: no joint pain NEUROLOGIC: No tingling, numbness, weakness.  PSYCHIATRY: No depression or anxiety.  DRUG ALLERGIES:  No Known Allergies VITALS:  Blood pressure 105/63, pulse 93, temperature 97.9 F (36.6 C), temperature source Oral, resp. rate 18, height 5\' 11"  (1.803 m), weight 128.595 kg (283 lb 8 oz), SpO2 99 %. PHYSICAL EXAMINATION:  GENERAL:  65 y.o.-year-old patient lying in the bed with no acute distress. Obesity. EYES: Pupils equal, round, reactive to light and accommodation. No scleral icterus. Extraocular muscles intact.  HEENT: Head atraumatic, normocephalic. Oropharynx and nasopharynx clear.  NECK:  Supple, no jugular venous distention. No thyroid enlargement, no tenderness.  LUNGS: Normal breath sounds bilaterally, no wheezing, rales,rhonchi or crepitation. No use of accessory muscles of respiration.  CARDIOVASCULAR: S1, S2 normal. No murmurs, rubs, or gallops.  ABDOMEN: Soft, nontender, nondistended. Bowel sounds present. No organomegaly or mass. urostomy and colostomy appear normal. Wound VAC present in midline. No melena or bloody stool in bag. EXTREMITIES: No pedal edema, cyanosis, or  clubbing.  NEUROLOGIC: Cranial nerves II through XII are intact. Muscle strength 3/5 in all extremities. Sensation intact. Gait not checked.  PSYCHIATRIC: The patient is alert and oriented x 3.  SKIN: No obvious rash, lesion, or ulcer.  LABORATORY PANEL:   CBC  Recent Labs Lab 11/08/15 0617  WBC 4.6  HGB 9.1*  HCT 27.7*  PLT 185   ------------------------------------------------------------------------------------------------------------------  Chemistries   Recent Labs Lab 11/05/15 0729  11/08/15 0617 11/09/15 0719  NA 137  < > 132* 132*  K 3.6  < > 4.2 4.6  CL 112*  < > 108 108  CO2 22  < > 20* 21*  GLUCOSE 160*  < > 153* 151*  BUN 33*  < > 30* 30*  CREATININE 1.84*  < > 1.53* 1.61*  CALCIUM 7.8*  < > 8.3* 8.3*  MG 2.0  < > 1.7  --   AST 22  --   --   --   ALT 28  --   --   --   ALKPHOS 116  --   --   --   BILITOT 0.8  --   --   --   < > = values in this interval not displayed. ------------------------------------------------------------------------------------------------------------------  Cardiac Enzymes  Recent Labs Lab 11/03/15 1609  TROPONINI 0.06*    ASSESSMENT AND PLAN:   1). Sepsis with UTI discontinue Etarpenem (due to history of enterococcal bacteremia treated with these antibiotics), on PO Keflex: PROTEUS MIRABILIS and blood cultures negative so far.  * Hypotension. Possible due to sepsis and hypovolemia.  Holdign Lopressor and monitor  2). Acute on chronic renal insufficiency - mostly likely due to inadequate fluid intake. Improved to baseline with normal saline IV. -  creat 1.53 today  3). Anemia of chronic disease - Patient was brought into the hospital due to anemia with a hemoglobin of 6.  On admission, hemoglobin was 7.3 which is consistent with his baseline. Hb was 6.5 and he got 1 unit PRBC transfusion. Hemoglobin decreased to 6.1 this morning, possible due to IV fluid dilution. No active bleeding. The patient got another 1 unit  PRBC transfusion. Hb 7.8 and given 1 more PRBC on 4/3 and Hb improved to 9.1 this am. On iron replacement.   4). Hypokalemia repleted  5). On total parenteral nutrition  - Continue TPN per dietitian.  6).Type 2 diabetes mellitus  Continue sliding scale insulin.   Was planned for D/C and all paperwork completed, but then notified by CSW later in the afternoon that facility declined at last minute, and now they r requesting new auth and reeval by PT - I've shared with CSW that I'm not happy with this last minute decision change by facility as patient was ready for D/C otherwise.  All the records are reviewed and case discussed with Care Management/Social Worker. Management plans discussed with the patient, his mother and they are in agreement.  CODE STATUS: Partial code.  TOTAL TIME TAKING CARE OF THIS PATIENT: 35 minutes.  Greater than 50% time was spent on coordination of care and face-to-face counseling.  POSSIBLE D/C TO SNF IN AM, DEPENDING ON CLINICAL CONDITION. PT reeval recommends STR/SNF - waiting for insurance Margretta Sidle, Talib Headley M.D on 11/09/2015 at 4:30 PM  Between 7am to 6pm - Pager - [phone]  After 6pm go to [email or web address] - password [email or web address] [facility]  Office  [phone]  CC: Primary care physician; Rachell Cipro, MD

## 2015-11-09 NOTE — Consult Note (Signed)
WOC wound consult note: WOC wound consult note Reason for Consult:NPWT to nonhealing surgical wound. Dressing was changed early this AM due to a leak.  Dressing currently intact and drainage green/brown noted in canister.  Wound type:Chronic nonhealing surgical wound Pressure Ulcer POA: N/A Drainage (amount, consistency, odor) Heavy feculent effluent draining from wound.  Periwound:  Erythema extending 1 cm from wound.   Dressing procedure/placement/frequency:Cleanse abdominal wound with NS and pat gently dry. Apply black granufoam to wound bed.  Cover with drape. Seal immediately achieved at 125 mmHg. Change Mon/Wed/Fri.  WOC ostomy consult note Stoma type/location: RLQ Urostomy, recessed. In skin fold. Will add barrier ring.  Stomal assessment/size: 1 " with barrier ring Peristomal assessment: Intact Treatment options for stomal/peristomal skin: Barrier ring Output: Clear urine  Ostomy pouching: 2pc. 2 1/4" urostomy pouch with barrier ring.  Education provided: Informed patient that we would be changing Mon/Wed/Fri. Verbalizes understanding.  Enrolled patient in [place] program: No  WOC ostomy consult note Stoma type/location: LUQ Colostomy Stomal assessment/size: 1" slightly budded, os at center Peristomal assessment: Intact Treatment options for stomal/peristomal skin: Will add barrier ring Output None in pouch at this time.  Ostomy pouching: 2pc. 2 1/4" pouch with barrier ring  Education provided:  Enrolled patient in [place] program: No

## 2015-11-09 NOTE — Progress Notes (Signed)
Received MD order to discharge patient to [facility], called Jettie Booze LPN at [facility] and gave her report, awaiting patient to be transferred via EMS

## 2015-12-07 ENCOUNTER — Telehealth: Payer: Self-pay | Admitting: *Deleted

## 2015-12-07 NOTE — Telephone Encounter (Signed)
Message from [place] at [facility] reporting pt is being transferred there from [place]. Pt's family requesting Dr. Benay Spice to be his attending MD for [facility]. Will make MD aware. Angela's cell # [phone] if needed.

## 2015-12-16 ENCOUNTER — Encounter: Payer: Self-pay | Admitting: *Deleted

## 2015-12-26 ENCOUNTER — Telehealth: Payer: Self-pay | Admitting: Oncology

## 2015-12-26 NOTE — Telephone Encounter (Signed)
Death certificate received in HIM. Patient status changed to deceased. °

## 2015-12-27 ENCOUNTER — Telehealth: Payer: Self-pay | Admitting: Oncology

## 2015-12-27 NOTE — Telephone Encounter (Signed)
[employer] was contacted that death certificate is ready for pick up

## 2016-01-05 NOTE — Progress Notes (Signed)
Call received from [place] at [facility] to inform Dr. Benay Spice that patient passed away today.  Dr. Benay Spice informed.

## 2016-01-05 DEATH — deceased

## 2016-02-08 ENCOUNTER — Telehealth: Payer: Self-pay | Admitting: *Deleted

## 2016-02-08 NOTE — Telephone Encounter (Signed)
Mother of deceased patient called wanting to speak to MD Benay Spice concerning death certificate. Noted on the death certificate "cause of death" is rectal cancer, but she states it was because of " complication from surgery/infection." Mother would like for this to be changed before filing this. Patient's mother can be reached at [phone]. Message sent to RN Tanya.

## 2016-02-13 NOTE — Telephone Encounter (Signed)
Late entry for 02/10/16: Dr. Benay Spice discussed with pt's mother. She will let us know if she decides to request an addendum.

## 2016-07-12 IMAGING — US US RENAL
1 series · 14 of 25 positions shown · non-contrast
Comparison: CT abdomen 08/28/2011

CLINICAL DATA: Elevated serum creatinine.

EXAM:
RENAL/URINARY TRACT ULTRASOUND COMPLETE

[Series 1: us renal · 0.28mm/px · 14 of 34 slices shown]
[im 1/34]
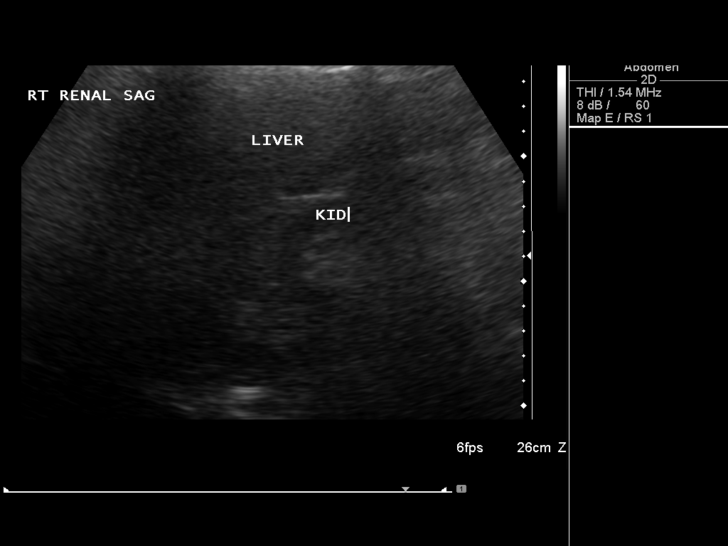
[im 3/34]
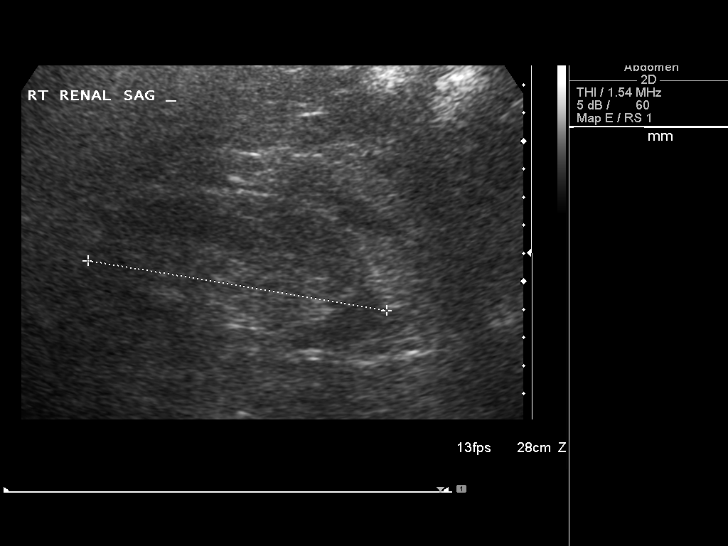
[im 6/34]
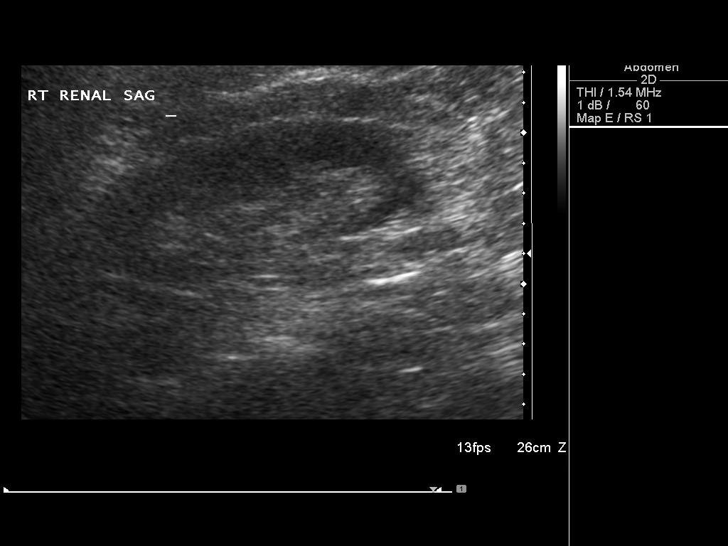
[im 9/34]
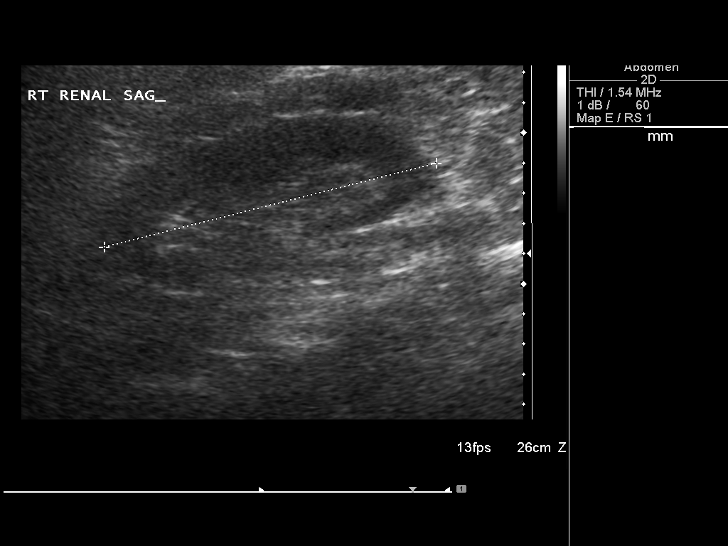
[im 12/34]
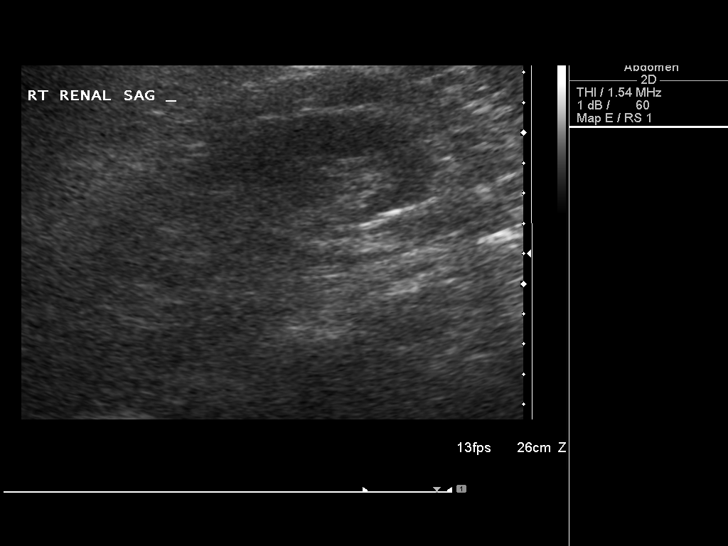
[im 13/34]
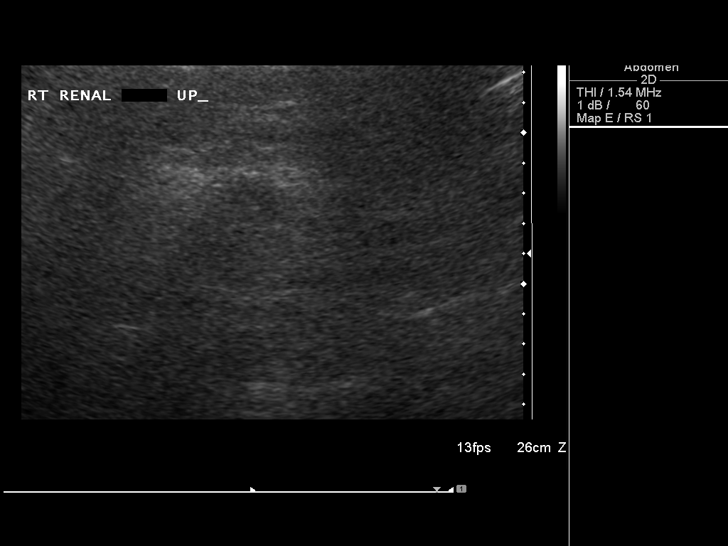
[im 16/34]
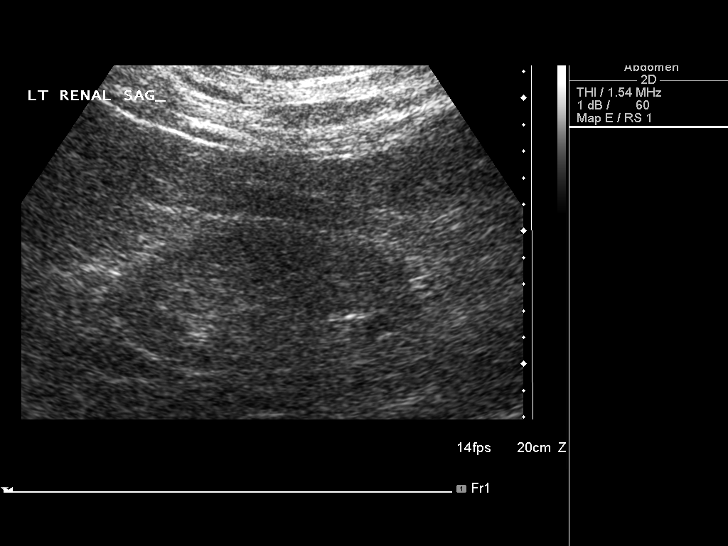
[im 18/34]
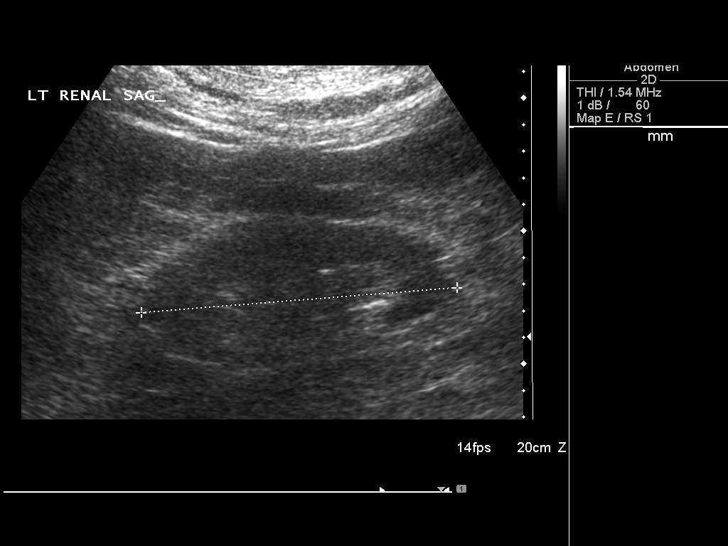
[im 21/34]
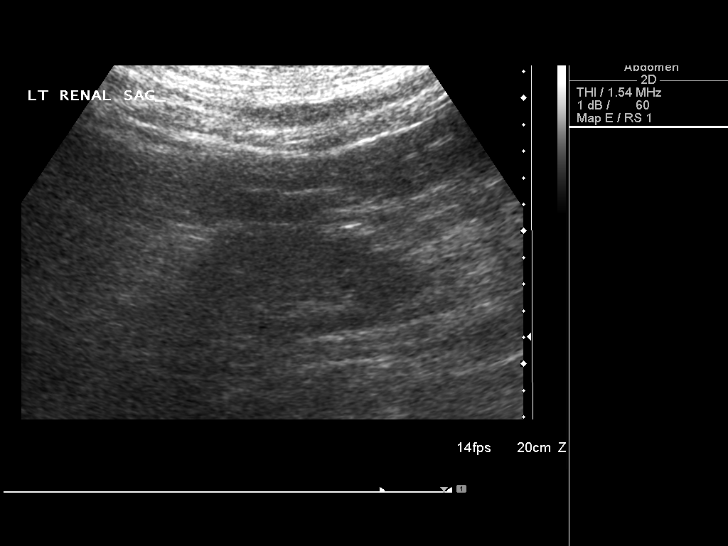
[im 23/34]
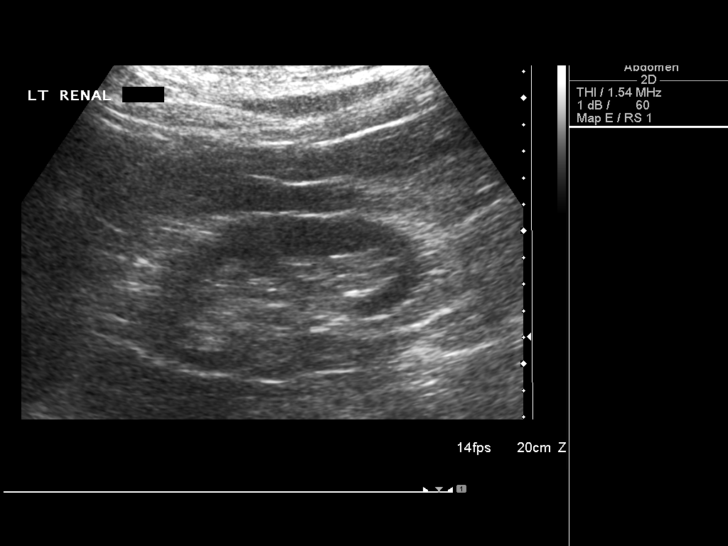
[im 25/34]
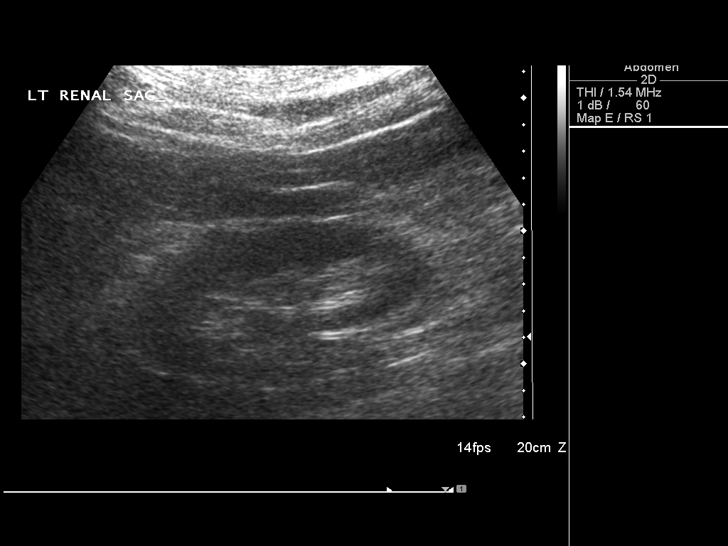
[im 28/34]
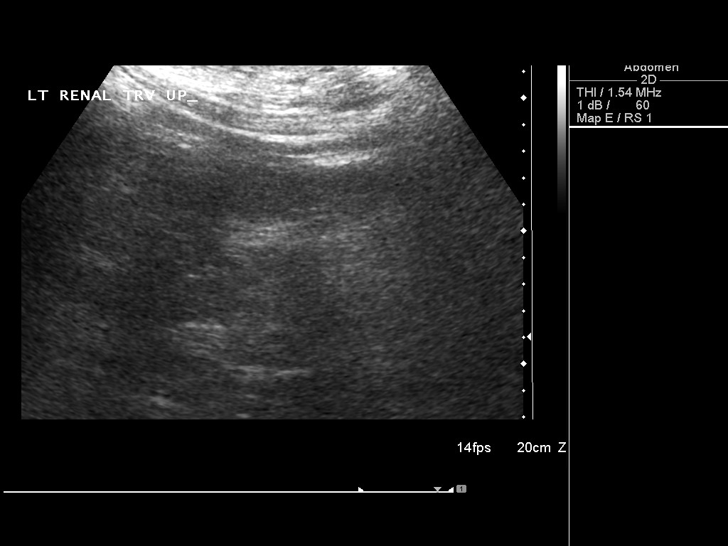
[im 31/34]
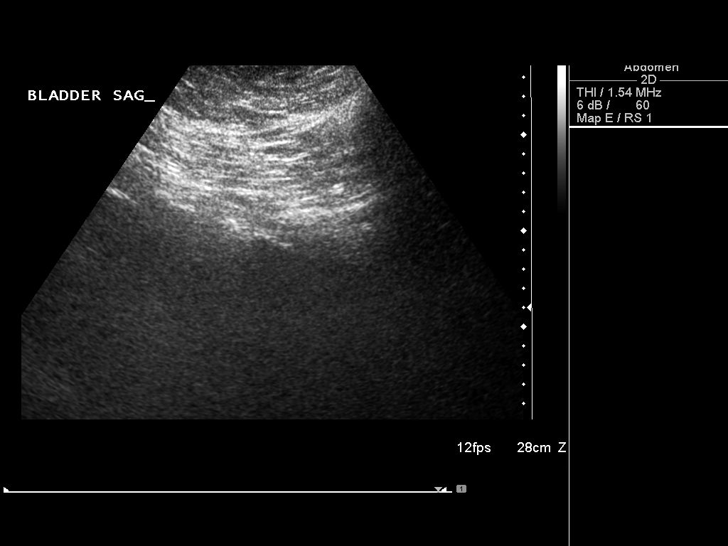
[im 34/34]
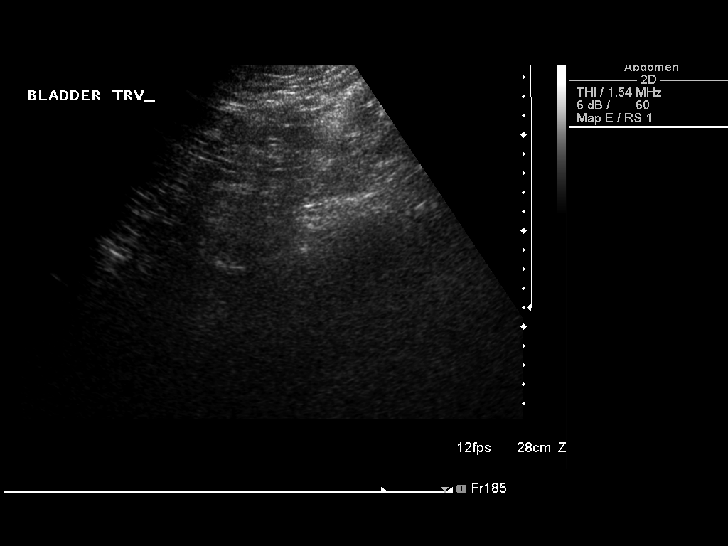

[14 of 25 positions shown; findings below may reference images not displayed]

FINDINGS: Right Kidney:

Length: 11.3 cm. Echogenicity within normal limits. No mass or
hydronephrosis visualized.

Left Kidney:

Length: 12 cm. Echogenicity within normal limits. No mass or
hydronephrosis visualized.

Bladder:

The bladder is decompressed limiting evaluation.
IMPRESSION: No obstructive uropathy.  Normal ultrasound of the kidneys.

## 2016-10-31 IMAGING — CR DG CHEST 2V
2 series · 2 of 2 positions shown · non-contrast
Comparison: 08/28/2011.

CLINICAL DATA: Preoperative chest x-ray.  Hypertension .

EXAM:
CHEST  2 VIEW

[w chest pa *]
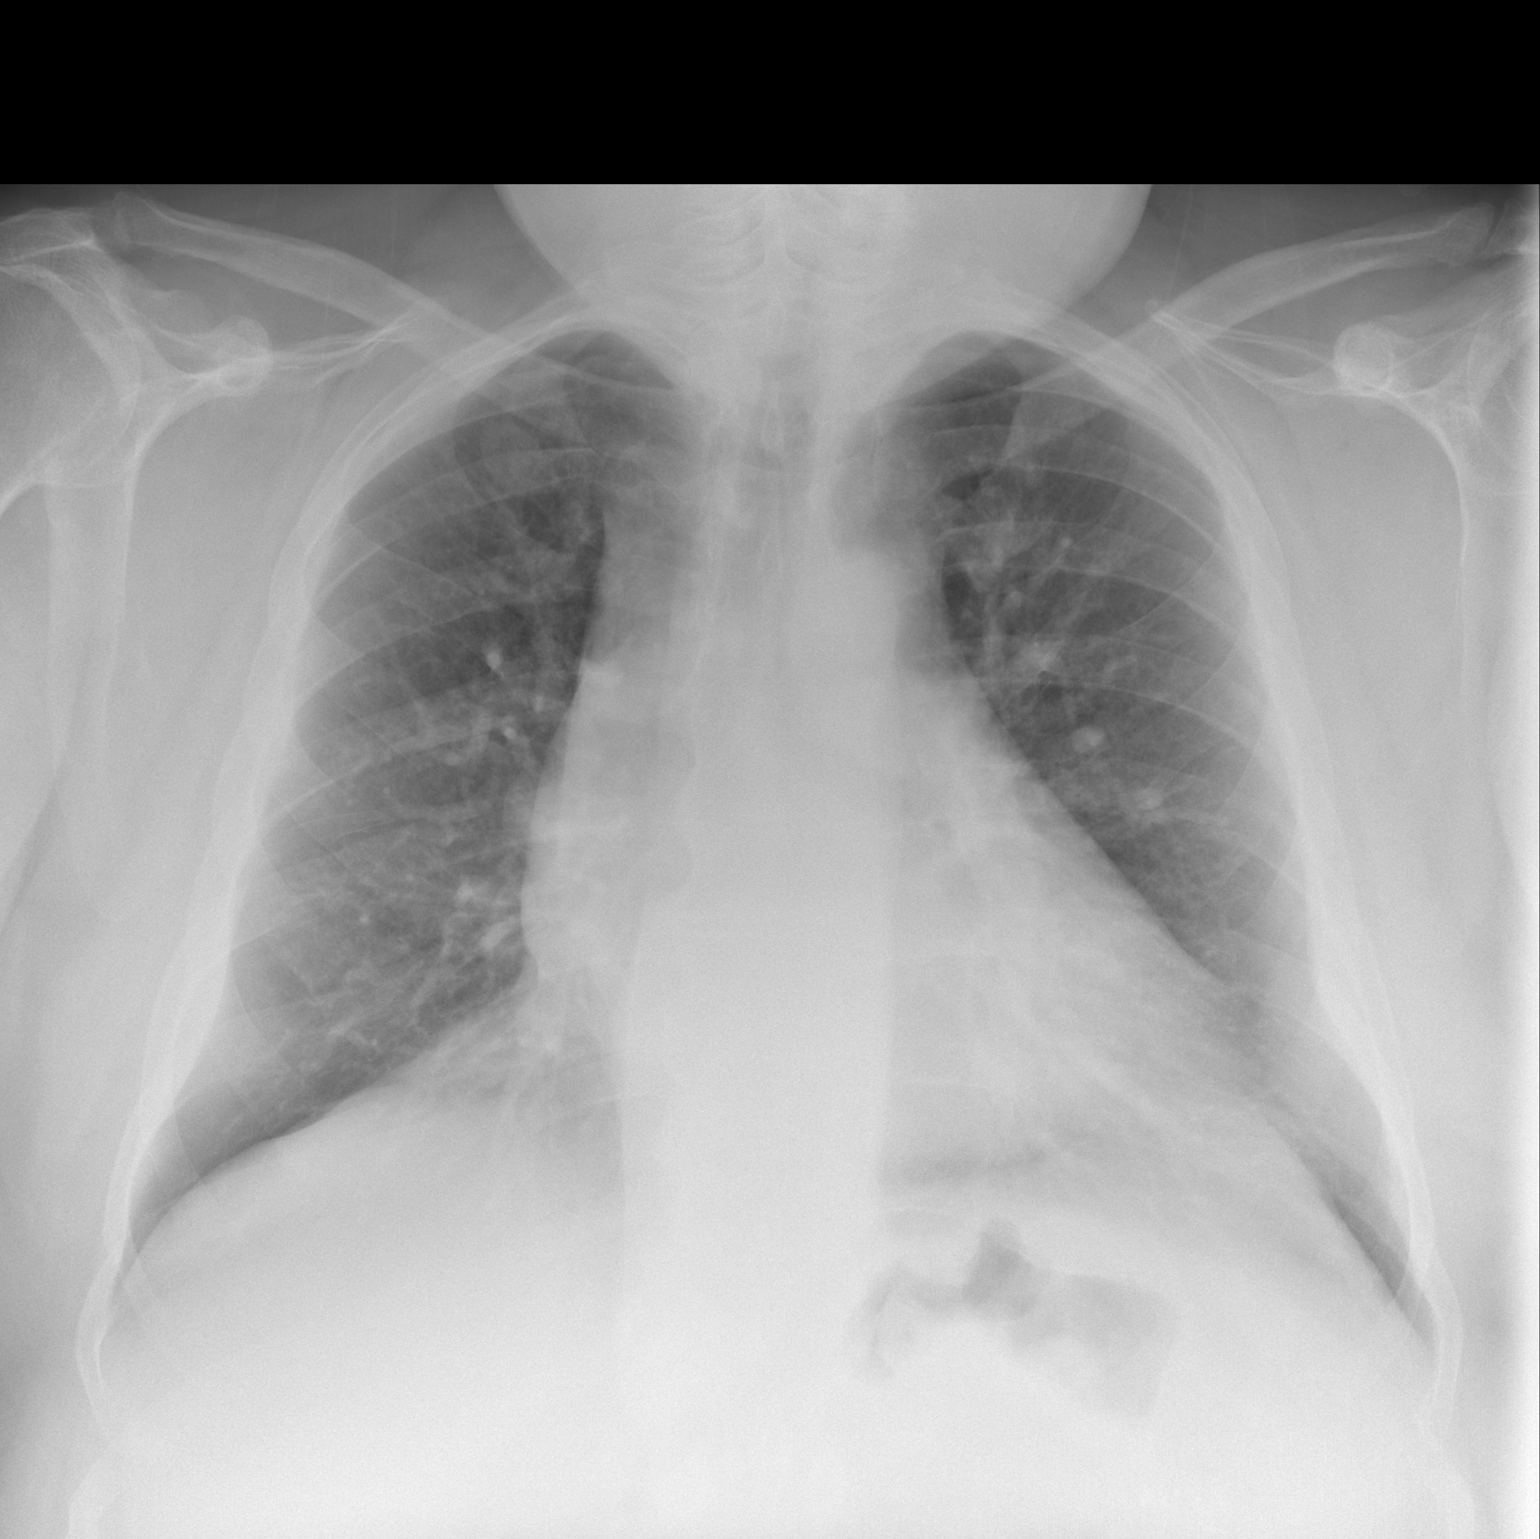

[w chest lat *]
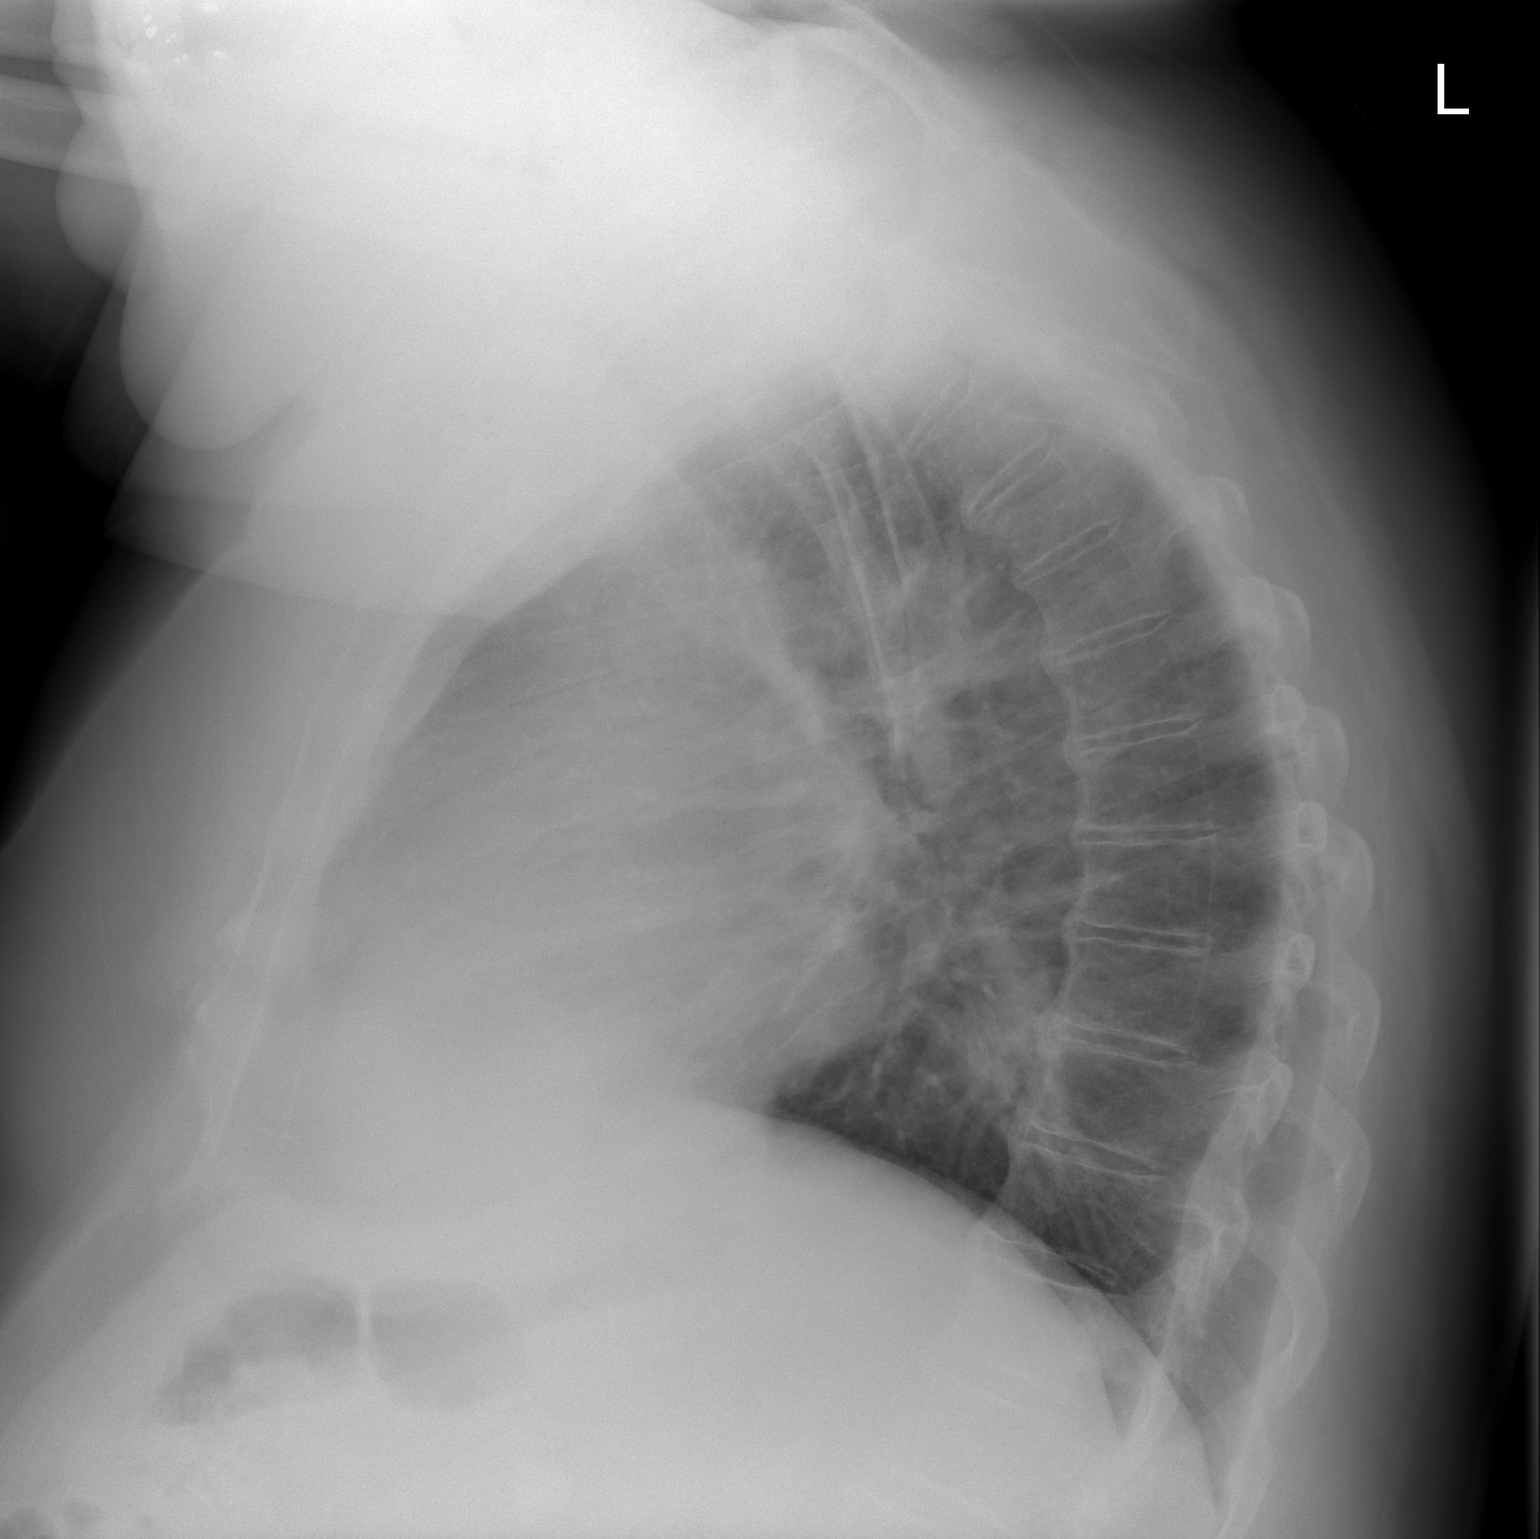

[2 of 2 positions shown; findings below may reference images not displayed]

FINDINGS: Mediastinum and hilar structures normal. Cardiomegaly with mild
pulmonary vascular prominence. No pulmonary edema or focal
infiltrate. No pleural effusion or pneumothorax.
IMPRESSION: Cardiomegaly with mild pulmonary vascular prominence.

## 2017-11-23 IMAGING — CR DG CHEST 1V PORT
1 series · 1 of 1 positions shown · non-contrast
Comparison: Chest radiograph February 27, 2015

CLINICAL DATA: Fever for 2 days. History of pulmonary embolism,
hypertension, shortness of breath.

EXAM:
PORTABLE CHEST 1 VIEW

[AP]
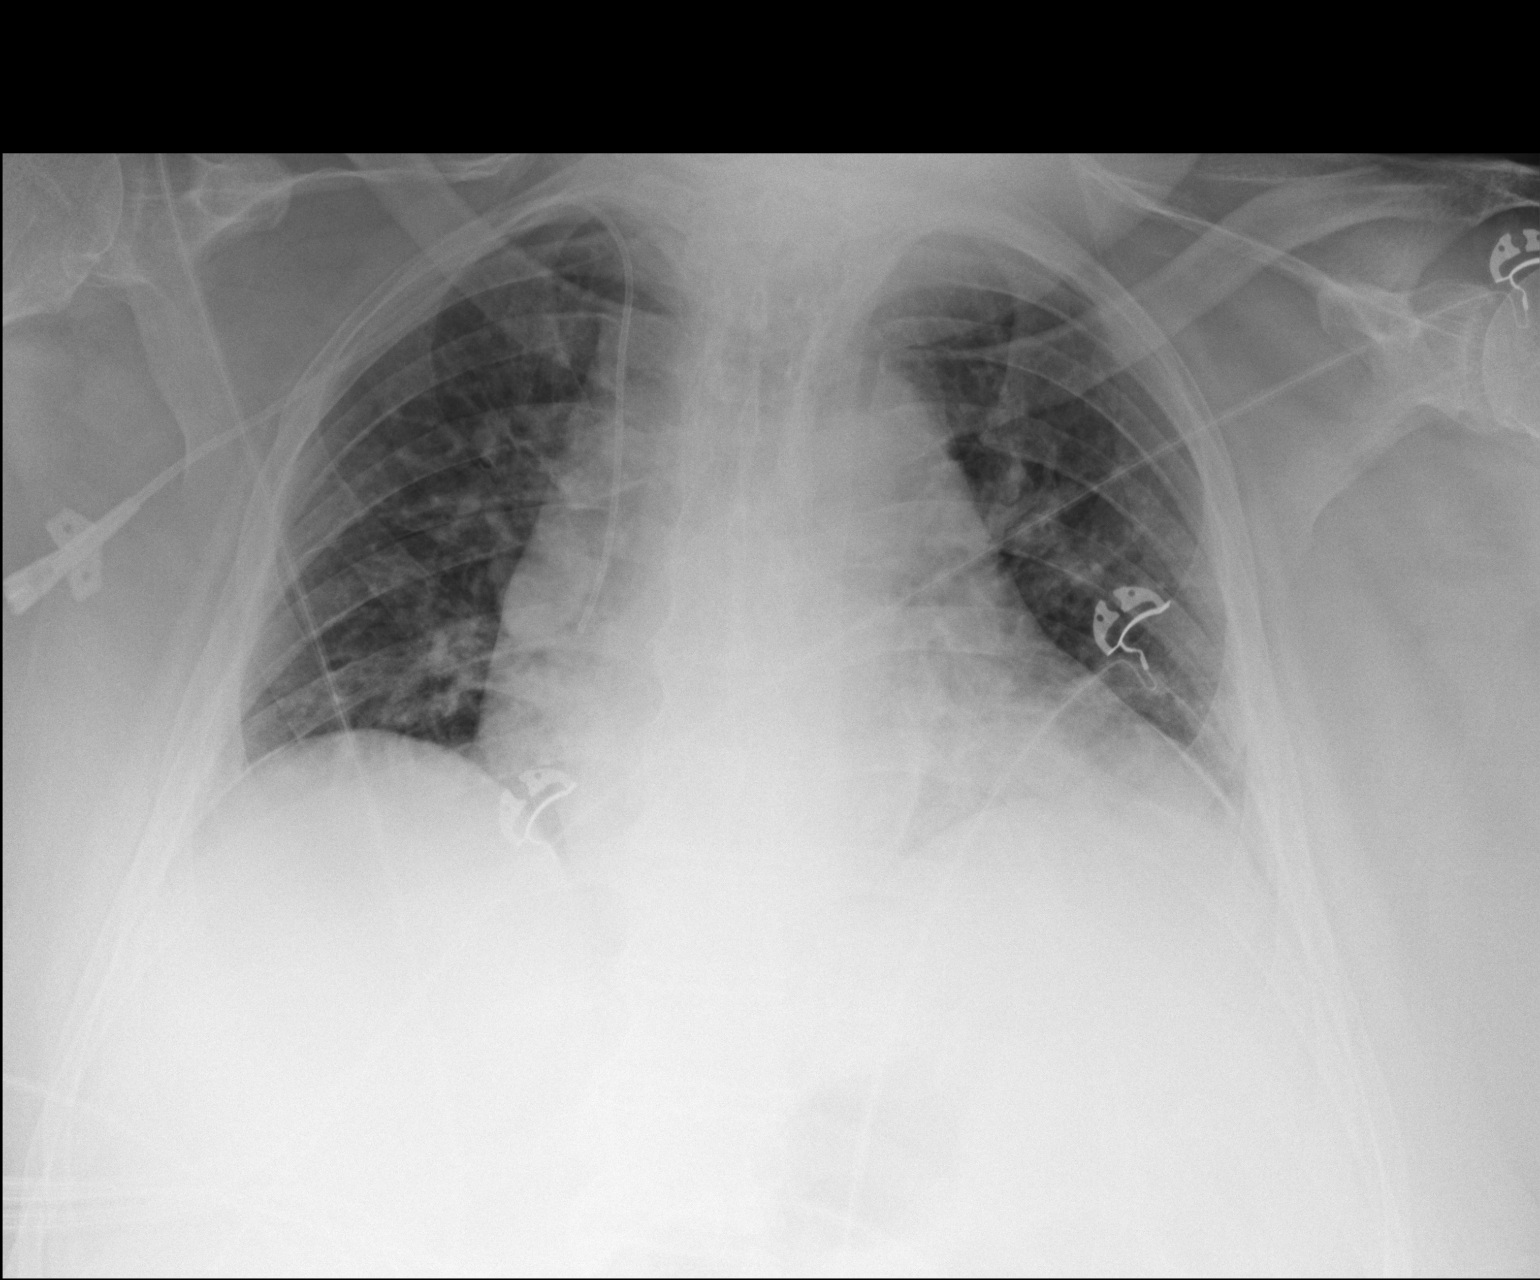

[1 of 1 positions shown; findings below may reference images not displayed]

FINDINGS: The cardiac silhouette appears moderately enlarged. Mediastinal
silhouette is nonsuspicious. Low inspiratory examination with
bibasilar strandy densities. No pleural effusion. No pneumothorax.
RIGHT tunnel probable catheter terminates at cavoatrial junction.
Large body habitus. Osseous structures are nonsuspicious. Mild
degenerative change of the thoracic spine.
IMPRESSION: Similar cardiomegaly. Bibasilar atelectasis in this low inspiratory
examination.
